# Patient Record
Sex: Female | Born: 1970
Health system: Southern US, Community
[De-identification: ages and names within clinical notes are randomized; demographics above are authoritative.]

## PROBLEM LIST (undated history)

## (undated) DIAGNOSIS — T8859XA Other complications of anesthesia, initial encounter: Secondary | ICD-10-CM

## (undated) DIAGNOSIS — R5382 Chronic fatigue, unspecified: Secondary | ICD-10-CM

## (undated) DIAGNOSIS — N8003 Adenomyosis of the uterus: Secondary | ICD-10-CM

## (undated) DIAGNOSIS — R55 Syncope and collapse: Secondary | ICD-10-CM

## (undated) DIAGNOSIS — I499 Cardiac arrhythmia, unspecified: Secondary | ICD-10-CM

## (undated) DIAGNOSIS — R3129 Other microscopic hematuria: Secondary | ICD-10-CM

## (undated) DIAGNOSIS — R011 Cardiac murmur, unspecified: Secondary | ICD-10-CM

## (undated) DIAGNOSIS — F32A Depression, unspecified: Secondary | ICD-10-CM

## (undated) DIAGNOSIS — J3489 Other specified disorders of nose and nasal sinuses: Secondary | ICD-10-CM

## (undated) DIAGNOSIS — F419 Anxiety disorder, unspecified: Secondary | ICD-10-CM

## (undated) DIAGNOSIS — Z8719 Personal history of other diseases of the digestive system: Secondary | ICD-10-CM

## (undated) DIAGNOSIS — I5189 Other ill-defined heart diseases: Secondary | ICD-10-CM

## (undated) DIAGNOSIS — E559 Vitamin D deficiency, unspecified: Secondary | ICD-10-CM

## (undated) DIAGNOSIS — Z87442 Personal history of urinary calculi: Secondary | ICD-10-CM

## (undated) DIAGNOSIS — R6889 Other general symptoms and signs: Secondary | ICD-10-CM

## (undated) DIAGNOSIS — Z8679 Personal history of other diseases of the circulatory system: Secondary | ICD-10-CM

## (undated) DIAGNOSIS — K219 Gastro-esophageal reflux disease without esophagitis: Secondary | ICD-10-CM

## (undated) DIAGNOSIS — R519 Headache, unspecified: Secondary | ICD-10-CM

## (undated) DIAGNOSIS — IMO0001 Reserved for inherently not codable concepts without codable children: Secondary | ICD-10-CM

## (undated) DIAGNOSIS — R002 Palpitations: Secondary | ICD-10-CM

## (undated) DIAGNOSIS — N390 Urinary tract infection, site not specified: Secondary | ICD-10-CM

## (undated) DIAGNOSIS — G8929 Other chronic pain: Secondary | ICD-10-CM

## (undated) DIAGNOSIS — Z8701 Personal history of pneumonia (recurrent): Secondary | ICD-10-CM

## (undated) DIAGNOSIS — J45909 Unspecified asthma, uncomplicated: Secondary | ICD-10-CM

## (undated) DIAGNOSIS — N393 Stress incontinence (female) (male): Secondary | ICD-10-CM

## (undated) DIAGNOSIS — M797 Fibromyalgia: Secondary | ICD-10-CM

## (undated) DIAGNOSIS — D500 Iron deficiency anemia secondary to blood loss (chronic): Secondary | ICD-10-CM

## (undated) DIAGNOSIS — N800 Endometriosis of uterus: Secondary | ICD-10-CM

## (undated) DIAGNOSIS — T7840XA Allergy, unspecified, initial encounter: Secondary | ICD-10-CM

## (undated) DIAGNOSIS — M1990 Unspecified osteoarthritis, unspecified site: Secondary | ICD-10-CM

## (undated) DIAGNOSIS — K200 Eosinophilic esophagitis: Secondary | ICD-10-CM

## (undated) DIAGNOSIS — N809 Endometriosis, unspecified: Secondary | ICD-10-CM

## (undated) DIAGNOSIS — M6289 Other specified disorders of muscle: Secondary | ICD-10-CM

## (undated) DIAGNOSIS — K1370 Unspecified lesions of oral mucosa: Secondary | ICD-10-CM

## (undated) DIAGNOSIS — T4145XA Adverse effect of unspecified anesthetic, initial encounter: Secondary | ICD-10-CM

## (undated) DIAGNOSIS — K589 Irritable bowel syndrome without diarrhea: Secondary | ICD-10-CM

## (undated) HISTORY — DX: Eosinophilic esophagitis: K20.0

## (undated) HISTORY — DX: Allergy, unspecified, initial encounter: T78.40XA

## (undated) HISTORY — DX: Unspecified asthma, uncomplicated: J45.909

## (undated) HISTORY — DX: Iron deficiency anemia secondary to blood loss (chronic): D50.0

## (undated) HISTORY — DX: Palpitations: R00.2

## (undated) HISTORY — DX: Other ill-defined heart diseases: I51.89

## (undated) HISTORY — DX: Anxiety disorder, unspecified: F41.9

## (undated) HISTORY — PX: BREAST BIOPSY: SHX20

## (undated) HISTORY — PX: ABDOMINAL HYSTERECTOMY: SHX81

## (undated) HISTORY — DX: Other microscopic hematuria: R31.29

## (undated) HISTORY — DX: Chronic fatigue, unspecified: R53.82

## (undated) HISTORY — DX: Irritable bowel syndrome without diarrhea: K58.9

## (undated) HISTORY — DX: Stress incontinence (female) (male): N39.3

## (undated) HISTORY — DX: Other specified disorders of nose and nasal sinuses: J34.89

## (undated) HISTORY — DX: Other specified disorders of muscle: M62.89

## (undated) HISTORY — DX: Other chronic pain: G89.29

## (undated) HISTORY — DX: Urinary tract infection, site not specified: N39.0

---
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
## 1998-04-17 ENCOUNTER — Encounter: Admission: RE | Admit: 1998-04-17 | Discharge: 1998-04-17 | Payer: Self-pay | Admitting: Family Medicine

## 1998-06-20 ENCOUNTER — Ambulatory Visit (HOSPITAL_COMMUNITY): Admission: RE | Admit: 1998-06-20 | Discharge: 1998-06-20 | Payer: Self-pay | Admitting: Obstetrics & Gynecology

## 1998-06-26 ENCOUNTER — Encounter: Admission: RE | Admit: 1998-06-26 | Discharge: 1998-06-26 | Payer: Self-pay | Admitting: Family Medicine

## 1998-07-20 ENCOUNTER — Inpatient Hospital Stay (HOSPITAL_COMMUNITY): Admission: AD | Admit: 1998-07-20 | Discharge: 1998-07-20 | Payer: Self-pay | Admitting: Obstetrics & Gynecology

## 1998-08-21 ENCOUNTER — Inpatient Hospital Stay (HOSPITAL_COMMUNITY): Admission: AD | Admit: 1998-08-21 | Discharge: 1998-08-23 | Payer: Self-pay | Admitting: Obstetrics and Gynecology

## 1998-10-11 ENCOUNTER — Other Ambulatory Visit: Admission: RE | Admit: 1998-10-11 | Discharge: 1998-10-11 | Payer: Self-pay | Admitting: Obstetrics & Gynecology

## 1999-04-23 ENCOUNTER — Encounter: Admission: RE | Admit: 1999-04-23 | Discharge: 1999-04-23 | Payer: Self-pay | Admitting: Sports Medicine

## 1999-07-02 ENCOUNTER — Encounter: Admission: RE | Admit: 1999-07-02 | Discharge: 1999-07-02 | Payer: Self-pay | Admitting: Family Medicine

## 1999-12-11 ENCOUNTER — Other Ambulatory Visit: Admission: RE | Admit: 1999-12-11 | Discharge: 1999-12-11 | Payer: Self-pay | Admitting: Obstetrics and Gynecology

## 1999-12-31 ENCOUNTER — Encounter: Admission: RE | Admit: 1999-12-31 | Discharge: 1999-12-31 | Payer: Self-pay | Admitting: Family Medicine

## 2000-08-07 ENCOUNTER — Encounter: Admission: RE | Admit: 2000-08-07 | Discharge: 2000-08-07 | Payer: Self-pay | Admitting: Family Medicine

## 2000-08-13 ENCOUNTER — Encounter: Admission: RE | Admit: 2000-08-13 | Discharge: 2000-08-13 | Payer: Self-pay | Admitting: Obstetrics and Gynecology

## 2000-08-13 ENCOUNTER — Encounter: Payer: Self-pay | Admitting: Obstetrics and Gynecology

## 2000-11-13 ENCOUNTER — Encounter: Admission: RE | Admit: 2000-11-13 | Discharge: 2000-11-13 | Payer: Self-pay | Admitting: Family Medicine

## 2001-01-06 ENCOUNTER — Other Ambulatory Visit: Admission: RE | Admit: 2001-01-06 | Discharge: 2001-01-06 | Payer: Self-pay | Admitting: Obstetrics and Gynecology

## 2001-01-13 ENCOUNTER — Encounter: Payer: Self-pay | Admitting: Obstetrics and Gynecology

## 2001-01-13 ENCOUNTER — Encounter: Admission: RE | Admit: 2001-01-13 | Discharge: 2001-01-13 | Payer: Self-pay | Admitting: Obstetrics and Gynecology

## 2001-07-28 ENCOUNTER — Inpatient Hospital Stay (HOSPITAL_COMMUNITY): Admission: AD | Admit: 2001-07-28 | Discharge: 2001-07-30 | Payer: Self-pay | Admitting: Obstetrics and Gynecology

## 2001-08-31 ENCOUNTER — Other Ambulatory Visit: Admission: RE | Admit: 2001-08-31 | Discharge: 2001-08-31 | Payer: Self-pay | Admitting: Obstetrics and Gynecology

## 2001-10-21 ENCOUNTER — Encounter: Admission: RE | Admit: 2001-10-21 | Discharge: 2001-10-21 | Payer: Self-pay | Admitting: Family Medicine

## 2002-01-12 ENCOUNTER — Encounter: Admission: RE | Admit: 2002-01-12 | Discharge: 2002-01-12 | Payer: Self-pay | Admitting: Family Medicine

## 2002-01-24 ENCOUNTER — Encounter: Admission: RE | Admit: 2002-01-24 | Discharge: 2002-01-24 | Payer: Self-pay | Admitting: Family Medicine

## 2002-03-09 ENCOUNTER — Other Ambulatory Visit: Admission: RE | Admit: 2002-03-09 | Discharge: 2002-03-09 | Payer: Self-pay | Admitting: Obstetrics and Gynecology

## 2002-06-20 ENCOUNTER — Inpatient Hospital Stay (HOSPITAL_COMMUNITY): Admission: AD | Admit: 2002-06-20 | Discharge: 2002-06-20 | Payer: Self-pay | Admitting: Obstetrics and Gynecology

## 2002-08-24 ENCOUNTER — Inpatient Hospital Stay (HOSPITAL_COMMUNITY): Admission: AD | Admit: 2002-08-24 | Discharge: 2002-08-24 | Payer: Self-pay | Admitting: Pediatrics

## 2002-09-26 ENCOUNTER — Inpatient Hospital Stay (HOSPITAL_COMMUNITY): Admission: AD | Admit: 2002-09-26 | Discharge: 2002-09-28 | Payer: Self-pay | Admitting: Obstetrics and Gynecology

## 2002-10-24 ENCOUNTER — Other Ambulatory Visit: Admission: RE | Admit: 2002-10-24 | Discharge: 2002-10-24 | Payer: Self-pay | Admitting: Obstetrics and Gynecology

## 2003-01-05 ENCOUNTER — Encounter: Admission: RE | Admit: 2003-01-05 | Discharge: 2003-01-05 | Payer: Self-pay | Admitting: Family Medicine

## 2003-03-01 ENCOUNTER — Encounter: Admission: RE | Admit: 2003-03-01 | Discharge: 2003-03-01 | Payer: Self-pay | Admitting: Family Medicine

## 2003-03-28 ENCOUNTER — Encounter: Admission: RE | Admit: 2003-03-28 | Discharge: 2003-03-28 | Payer: Self-pay | Admitting: Family Medicine

## 2003-04-18 ENCOUNTER — Encounter: Admission: RE | Admit: 2003-04-18 | Discharge: 2003-04-18 | Payer: Self-pay | Admitting: Family Medicine

## 2003-12-06 ENCOUNTER — Other Ambulatory Visit: Admission: RE | Admit: 2003-12-06 | Discharge: 2003-12-06 | Payer: Self-pay | Admitting: Obstetrics and Gynecology

## 2004-07-18 ENCOUNTER — Ambulatory Visit: Payer: Self-pay | Admitting: Family Medicine

## 2005-01-02 ENCOUNTER — Other Ambulatory Visit: Admission: RE | Admit: 2005-01-02 | Discharge: 2005-01-02 | Payer: Self-pay | Admitting: Obstetrics and Gynecology

## 2005-02-03 ENCOUNTER — Ambulatory Visit: Payer: Self-pay | Admitting: Family Medicine

## 2005-02-11 ENCOUNTER — Encounter: Admission: RE | Admit: 2005-02-11 | Discharge: 2005-02-11 | Payer: Self-pay | Admitting: Obstetrics and Gynecology

## 2005-03-04 ENCOUNTER — Ambulatory Visit: Payer: Self-pay | Admitting: Family Medicine

## 2005-04-24 ENCOUNTER — Emergency Department (HOSPITAL_COMMUNITY): Admission: EM | Admit: 2005-04-24 | Discharge: 2005-04-24 | Payer: Self-pay | Admitting: Emergency Medicine

## 2005-08-28 ENCOUNTER — Ambulatory Visit: Payer: Self-pay | Admitting: Family Medicine

## 2005-09-02 ENCOUNTER — Ambulatory Visit: Payer: Self-pay | Admitting: Family Medicine

## 2005-12-11 ENCOUNTER — Encounter (INDEPENDENT_AMBULATORY_CARE_PROVIDER_SITE_OTHER): Payer: Self-pay | Admitting: *Deleted

## 2005-12-11 LAB — CONVERTED CEMR LAB

## 2006-07-23 ENCOUNTER — Ambulatory Visit: Payer: Self-pay | Admitting: Family Medicine

## 2006-09-03 ENCOUNTER — Ambulatory Visit: Payer: Self-pay | Admitting: Family Medicine

## 2006-12-15 ENCOUNTER — Ambulatory Visit (HOSPITAL_COMMUNITY): Admission: RE | Admit: 2006-12-15 | Discharge: 2006-12-15 | Payer: Self-pay | Admitting: Obstetrics and Gynecology

## 2006-12-17 ENCOUNTER — Ambulatory Visit: Payer: Self-pay | Admitting: Family Medicine

## 2007-01-07 DIAGNOSIS — M797 Fibromyalgia: Secondary | ICD-10-CM | POA: Insufficient documentation

## 2007-01-08 ENCOUNTER — Encounter (INDEPENDENT_AMBULATORY_CARE_PROVIDER_SITE_OTHER): Payer: Self-pay | Admitting: *Deleted

## 2007-03-02 ENCOUNTER — Ambulatory Visit (HOSPITAL_COMMUNITY): Admission: RE | Admit: 2007-03-02 | Discharge: 2007-03-02 | Payer: Self-pay | Admitting: Obstetrics and Gynecology

## 2007-07-26 ENCOUNTER — Inpatient Hospital Stay (HOSPITAL_COMMUNITY): Admission: AD | Admit: 2007-07-26 | Discharge: 2007-07-26 | Payer: Self-pay | Admitting: Obstetrics and Gynecology

## 2007-08-02 ENCOUNTER — Inpatient Hospital Stay (HOSPITAL_COMMUNITY): Admission: AD | Admit: 2007-08-02 | Discharge: 2007-08-02 | Payer: Self-pay | Admitting: Obstetrics and Gynecology

## 2007-08-06 ENCOUNTER — Inpatient Hospital Stay (HOSPITAL_COMMUNITY): Admission: AD | Admit: 2007-08-06 | Discharge: 2007-08-08 | Payer: Self-pay | Admitting: Obstetrics and Gynecology

## 2007-09-09 ENCOUNTER — Ambulatory Visit: Payer: Self-pay | Admitting: Family Medicine

## 2008-10-26 ENCOUNTER — Encounter: Admission: RE | Admit: 2008-10-26 | Discharge: 2008-10-26 | Payer: Self-pay | Admitting: Obstetrics and Gynecology

## 2008-11-13 ENCOUNTER — Encounter (INDEPENDENT_AMBULATORY_CARE_PROVIDER_SITE_OTHER): Payer: Self-pay | Admitting: Diagnostic Radiology

## 2008-11-13 ENCOUNTER — Encounter: Admission: RE | Admit: 2008-11-13 | Discharge: 2008-11-13 | Payer: Self-pay | Admitting: Obstetrics and Gynecology

## 2009-07-19 ENCOUNTER — Ambulatory Visit: Payer: Self-pay | Admitting: Family Medicine

## 2009-07-19 DIAGNOSIS — J309 Allergic rhinitis, unspecified: Secondary | ICD-10-CM | POA: Insufficient documentation

## 2009-08-08 ENCOUNTER — Encounter: Admission: RE | Admit: 2009-08-08 | Discharge: 2009-08-08 | Payer: Self-pay | Admitting: Obstetrics and Gynecology

## 2009-12-17 ENCOUNTER — Encounter: Admission: RE | Admit: 2009-12-17 | Discharge: 2009-12-17 | Payer: Self-pay | Admitting: Obstetrics and Gynecology

## 2010-05-21 ENCOUNTER — Encounter: Payer: Self-pay | Admitting: Family Medicine

## 2010-05-27 ENCOUNTER — Ambulatory Visit: Payer: Self-pay | Admitting: Family Medicine

## 2010-08-08 ENCOUNTER — Telehealth: Payer: Self-pay | Admitting: *Deleted

## 2010-12-04 ENCOUNTER — Ambulatory Visit
Admission: RE | Admit: 2010-12-04 | Discharge: 2010-12-04 | Payer: Self-pay | Source: Home / Self Care | Attending: Family Medicine | Admitting: Family Medicine

## 2010-12-04 DIAGNOSIS — J029 Acute pharyngitis, unspecified: Secondary | ICD-10-CM | POA: Insufficient documentation

## 2010-12-10 ENCOUNTER — Telehealth: Payer: Self-pay | Admitting: Family Medicine

## 2010-12-10 NOTE — Consult Note (Signed)
Summary: [facility]   Imported By: Clydell Hakim 06/07/2010 14:04:45  _____________________________________________________________________  External Attachment:    Type:   Image     Comment:   External Document

## 2010-12-10 NOTE — Progress Notes (Signed)
Summary: Triage call  Phone Note Call from Patient Call back at Home Phone [phone]   Caller: Patient Summary of Call: having low bp (90/50) and very heavy menses she is swollen and hurts all over (has fibromyalgia) and wants to know if she could take Tylenol or Advil for her pain - not sure what to do  Initial call taken by: De Nurse,  August 08, 2010 1:47 PM  Follow-up for Phone Call        Menstral cycle is almost complete and has been communicating with her OB/Gyn regarding the heavy flow (Dr. Huntley Dec).  Encouraged pt to drink plenty of fluids and use salt with food.  Told her it was okay to take either Tylenol or Advil.  Advised her to stop by the clinic tomorrow and we would check a manual BP on her.  Pt agreeable.   Follow-up by: Dennison Nancy RN,  August 08, 2010 3:52 PM

## 2010-12-10 NOTE — Assessment & Plan Note (Signed)
Summary: low BP,df   Vital Signs:  Patient profile:   40 year old female Height:      66.5 inches Weight:      142 pounds BMI:     22.66 BSA:     1.74 Temp:     98.2 degrees F Pulse rate:   62 / minute Pulse (ortho):   87 / minute BP sitting:   123 / 77 BP standing:   105 / 72  Vitals Entered By: Jone Baseman CMA (May 27, 2010 2:33 PM)  Serial Vital Signs/Assessments:  Time      Position  BP       Pulse  Resp  Temp     By 3:02 PM   Lying LA  107/72   64                    Asha Benton LPN 1:61 PM   Standing  105/72   87                    Asha Benton LPN  CC: Low BP Is Patient Diabetic? No Pain Assessment Patient in pain? no        CC:  Low BP.  History of Present Illness: Episode 2.5 weeks ago one morning was rushing and working after not having eaten anybreakfast when felt nauseas and the had urge defecate the intense heat flash then panting and numbness of all extremities and inablity to move mouth muscles. Lasted apprx 10 minutes and resolved.  None since.  had eaten squash night before to which she has had GI reactions in the past.  No chest pain or shortness of breath during or since.  No blood loss.  Had blood tests in Dr Dulce Sellar office last week no results yet.  She checked her blood pressure last week at her Gyn office and was low 90/58.  She feels orthostatic when stands abruptly.   No new medications or change in diet  ROS - as above PMH - Medications reviewed and updated in medication list.  Smoking Status noted in VS form    Habits & Providers  Alcohol-Tobacco-Diet     Tobacco Status: never  Allergies: 1)  Sulfamethoxazole (Sulfamethoxazole)  Physical Exam  General:  Well-developed,well-nourished,in no acute distress; alert,appropriate and cooperative throughout examination Eyes:  PERRL EOMI  Neck:  No deformities, masses, or tenderness noted. Lungs:  Normal respiratory effort, chest expands symmetrically. Lungs are clear to auscultation, no  crackles or wheezes. Heart:  Normal rate and regular rhythm. S1 and S2 normal without gallop, murmur, click, rub or other extra sounds. Abdomen:  Bowel sounds positive,abdomen soft and non-tender without masses, organomegaly or hernias noted. Msk:  No deformity or scoliosis noted of thoracic or lumbar spine.   Extremities:  No clubbing, cyanosis, edema, or deformity noted with normal full range of motion of all joints.   Neurologic:  CN 2-7 intact.  Strength and gait normal    Impression & Recommendations:  Problem # 1:  HYPOTENSION (ICD-458.9) Assessment New  I think her episode is consistent with low blood pressure due to chronic dehydration coupled with transient intestinal cramps which caused hyperventilation and resultant extremity numbness.  No evidence of cardiovascular or cns disease or event.  Recommend better hydration and montioring of her blood pressure.  Will ask for labs from Dr Dulce Sellar   Orders: [facility]- Est  Level 4 (09604)  Problem # 2:  SYNCOPE (ICD-780.2) no further occurences   Complete  Medication List: 1)  Nasonex 50 Mcg/act Susp (Mometasone furoate) .Marland Kitchen.. 1-2 each nostril qd 2)  Loratadine 10 Mg Tabs (Loratadine) .... Once daily as needed allergies otc  Patient Instructions: 1)  Come back if any other symptoms 2)  Drink water or gatorade regularly and moderately increase your intake of salt. 3)  We will send for records of Dr Malena Edman blood tests and I will call you if we need additional ones 4)  Hope the summer goes well

## 2010-12-12 ENCOUNTER — Encounter: Payer: Self-pay | Admitting: *Deleted

## 2010-12-12 NOTE — Assessment & Plan Note (Signed)
Summary: PHYSICAL/REVIEW BLOOD WORK/[facility]   Vital Signs:  Patient profile:   40 year old female Height:      66.5 inches Weight:      143.4 pounds BMI:     22.88 Temp:     98.3 degrees F oral Pulse rate:   70 / minute BP sitting:   111 / 75  (left arm) Cuff size:   regular  Vitals Entered By: Garen Grams LPN (December 04, 2010 10:08 AM) CC: cpe Is Patient Diabetic? No Pain Assessment Patient in pain? yes     Location: joints   CC:  cpe.  History of Present Illness: Sorethroat sore for last 1-2 weeks.  Treated with partial course of Amox from UC (she stopped becuase too high a dose 1000 mg two times a day).   Feels mildly congested and has swollen glands.  No fever recenlty.  No rash.  Fibromyalgia flare Achy Joints  worse last few weeks. Lots of stress with her mom diagnosised with cancer.  No redness or soft tissue swelling is worse after activity but has not been exercising with throat and with too many activities  ROS - as above PMH - Medications reviewed and updated in medication list.  Smoking Status noted in VS form    Habits & Providers  Alcohol-Tobacco-Diet     Tobacco Status: never  Current Medications (verified): 1)  Nasonex 50 Mcg/act Susp (Mometasone Furoate) .Marland Kitchen.. 1-2 Each Nostril Qd 2)  Loratadine 10 Mg  Tabs (Loratadine) .... Once Daily As Needed Allergies Otc  Allergies: 1)  Sulfamethoxazole (Sulfamethoxazole)  Physical Exam  General:  Well-developed,well-nourished,in no acute distress; alert,appropriate and cooperative throughout examination Head:  Normocephalic and atraumatic without obvious abnormalities. No apparent alopecia or balding. Nose:  External nasal examination shows no deformity or inflammation. Nasal mucosa are slightlty boggy without pus Mouth:  throat is red without pus and showing some post nasal drip,   Neck:  mildly increased adenopathy bilaterally Lungs:  Normal respiratory effort, chest expands symmetrically. Lungs are clear  to auscultation, no crackles or wheezes. Heart:  Normal rate and regular rhythm. S1 and S2 normal without gallop, murmur, click, rub or other extra sounds. Abdomen:  Bowel sounds positive,abdomen soft and non-tender without masses, organomegaly or hernias noted. Msk:  No deformity or scoliosis noted of thoracic or lumbar spine.   Extremities:  No clubbing, cyanosis, edema, or deformity noted with normal full range of motion of all joints.  Muscles on palpation are diffusely mildly tender  Skin:  Intact without suspicious lesions or rashes Inguinal Nodes:  No significant adenopathy   Impression & Recommendations:  Problem # 1:  PHARYNGITIS (ICD-462) Assessment New  Most consistent with a partially treated strep throat vs a prolonged viral URI wiht pain due to post nasal drip.  will ask her to finish a 10 day course of regular dose amoxicillin and use clariten and follow to resolution  Orders: [facility]- Est  Level 4 (16109)  Problem # 2:  FIBROMYALGIA, FIBROMYOSITIS (ICD-729.1) Assessment: Deteriorated  Her muscle pain seems to be a flare of FM.  No signs of arthritis or strep related disease.   Suggest slow resumption of exercise and stress management  Orders: [facility]- Est  Level 4 (60454)  Complete Medication List: 1)  Nasonex 50 Mcg/act Susp (Mometasone furoate) .Marland Kitchen.. 1-2 each nostril qd 2)  Loratadine 10 Mg Tabs (Loratadine) .... Once daily as needed allergies otc  Patient Instructions: 1)  Finsih 10 days of Amoxicillin 250 mg 1/2 capsule  two times a day  2)  Start Clariten once a day 3)  Exercise starting 5 min day then go up by 2-3 min a day 4)  Call if you are not better in 2 weeks   Orders Added: 1)  [facility]- Est  Level 4 [47829]

## 2010-12-18 NOTE — Progress Notes (Signed)
Summary: blood in urine  Phone Note Call from Patient   Summary of Call:  Has noticed blood in urine since today that occured after sex.  Has hx of this in past.  no fever, no back pain, is concerned that she may have a uti.  Instructed to call in AM for work in appt or go to ER if she feels that she needs to be evaluated tonight. Pt states she will call in am for appt. Ellin Mayhew MD  December 10, 2010 9:25 PM

## 2011-01-06 ENCOUNTER — Other Ambulatory Visit: Payer: Self-pay | Admitting: Family Medicine

## 2011-04-30 ENCOUNTER — Ambulatory Visit (INDEPENDENT_AMBULATORY_CARE_PROVIDER_SITE_OTHER): Payer: BC Managed Care – PPO | Admitting: *Deleted

## 2011-04-30 VITALS — Temp 98.3°F

## 2011-04-30 DIAGNOSIS — Z23 Encounter for immunization: Secondary | ICD-10-CM

## 2011-04-30 MED ORDER — TETANUS-DIPHTH-ACELL PERTUSSIS 5-2.5-18.5 LF-MCG/0.5 IM SUSP: 0.5000 mL | Freq: Once | INTRAMUSCULAR | Status: DC

## 2011-07-29 ENCOUNTER — Encounter: Payer: Self-pay | Admitting: Family Medicine

## 2011-08-06 ENCOUNTER — Encounter: Payer: Self-pay | Admitting: Family Medicine

## 2011-08-06 ENCOUNTER — Ambulatory Visit (INDEPENDENT_AMBULATORY_CARE_PROVIDER_SITE_OTHER): Payer: BC Managed Care – PPO | Admitting: Family Medicine

## 2011-08-06 VITALS — BP 111/72 | HR 73 | Temp 98.0°F | Wt 140.0 lb

## 2011-08-06 DIAGNOSIS — G4700 Insomnia, unspecified: Secondary | ICD-10-CM | POA: Insufficient documentation

## 2011-08-06 DIAGNOSIS — R5381 Other malaise: Secondary | ICD-10-CM

## 2011-08-06 DIAGNOSIS — R5383 Other fatigue: Secondary | ICD-10-CM | POA: Insufficient documentation

## 2011-08-06 LAB — COMPREHENSIVE METABOLIC PANEL
ALT: 8 U/L (ref 0–35)
Alkaline Phosphatase: 58 U/L (ref 39–117)
CO2: 24 mEq/L (ref 19–32)
Glucose, Bld: 90 mg/dL (ref 70–99)

## 2011-08-06 LAB — CBC
Hemoglobin: 12.4 g/dL (ref 12.0–15.0)
MCH: 28.3 pg (ref 26.0–34.0)
MCHC: 32.2 g/dL (ref 30.0–36.0)
RBC: 4.38 MIL/uL (ref 3.87–5.11)
RDW: 13.6 % (ref 11.5–15.5)

## 2011-08-06 MED ORDER — ZOLPIDEM TARTRATE 5 MG PO TABS: 5.0000 mg | ORAL_TABLET | Freq: Every evening | ORAL | Status: DC | PRN

## 2011-08-06 NOTE — Patient Instructions (Signed)
You should feel stressed  Keep up the exercise that is the best medicine  Use ambien 1/2-1 tablet as needed but not more than 3 x a week  Make an appointment if things are worsening

## 2011-08-06 NOTE — Assessment & Plan Note (Addendum)
Persistent.  Seems most likely due to stress of her many responsibilities with 4 children and traveling husband and mother is dying of cancer.  Does not meet criteria for depression.  Will check labs and reassure if these are normal

## 2011-08-06 NOTE — Assessment & Plan Note (Signed)
New, situational.  She does not feel depressed.  Will treat with intermittent hypnotic and follow

## 2011-08-06 NOTE — Progress Notes (Signed)
  Subjective:    Patient ID: Emily Joseph, female    DOB: June 05, 1971, 40 y.o.   MRN: [id number]  HPI Here for check up Concerns  FATIGUE  Onset: for last few months at least  Fatigue with exertion: no  Physical limitations: no Primarily motivational fatigue: mostly related to stress feels tired and trouble sleeping at the end of the day.  Primarily physical fatigue: no  Symptoms Fever: no  Night sweats: no  Weight loss: no   Exertional chest pain: no  Dyspnea: no  Cough: no  Hemoptysis: no  New medications: no  Leg swelling: no  Orthopnea: no  PND: no  Melena: no  Adenopathy: mild seasonal with allergies  Severe snoring: no  Daytime sleepiness: no  Skin changes: no  Feeling depressed: no  Anhedonia: no  Altered appetite: no  Poor sleep: yes, does not feel rested, trouble getting to sleep cause worrying.    ABDOMINAL PAIN  Location: RLQ Onset: on and off for many months  Radiation: no Severity: causes her to notice it but not stop what she is doing Quality: stabbing Duration: minutes Better with: just goes away Worse with: nothing   Symptoms Nausea/Vomiting: no  Diarrhea: no  Constipation: yes, just her usual  Melena/BRBPR: no  Hematemesis: no  Anorexia: no  Fever/Chills: no  Dysuria: no  Rash: no  Wt loss: no  EtOH use: no  NSAIDs/ASA: yes, rarely  LMP: irregular   Past Surgeries: had surgery in that area as a teenager  Review of Systems see above    Objective:   Physical Exam Heart - Regular rate and rhythm.  No murmurs, gallops or rubs.    Lungs:  Normal respiratory effort, chest expands symmetrically. Lungs are clear to auscultation, no crackles or wheezes. Abdomen: soft and tender mildly in RLQ without masses, organomegaly or hernias noted.  No guarding or rebound Extremities:  No cyanosis, edema, or deformity noted with good range of motion of all major joints.   Neck:  No deformities, thyromegaly, masses, or tenderness noted.   Supple with full  range of motion without pain. Ears:  External ear exam shows no significant lesions or deformities.  Otoscopic examination reveals clear canals, tympanic membranes are intact bilaterally without bulging, retraction, inflammation or discharge. Hearing is grossly normal bilaterall Skin:  Intact without suspicious lesions or rashes         Assessment & Plan:

## 2011-08-07 ENCOUNTER — Encounter: Payer: Self-pay | Admitting: Family Medicine

## 2011-08-21 LAB — CBC
MCHC: 35.2
MCV: 91.8
MCV: 92.3
Platelets: 164
Platelets: 187
RBC: 3.61 — ABNORMAL LOW

## 2011-08-21 LAB — RAPID HIV SCREEN (WH-MAU): Rapid HIV Screen: NONREACTIVE

## 2012-01-26 ENCOUNTER — Other Ambulatory Visit: Payer: Self-pay | Admitting: Obstetrics and Gynecology

## 2012-02-19 ENCOUNTER — Encounter: Payer: Self-pay | Admitting: Rheumatology

## 2012-03-24 ENCOUNTER — Encounter: Payer: Self-pay | Admitting: Family Medicine

## 2012-03-24 ENCOUNTER — Ambulatory Visit (INDEPENDENT_AMBULATORY_CARE_PROVIDER_SITE_OTHER): Payer: 59 | Admitting: Family Medicine

## 2012-03-24 VITALS — BP 114/63 | HR 73 | Temp 98.2°F | Ht 66.5 in | Wt 135.0 lb

## 2012-03-24 DIAGNOSIS — J309 Allergic rhinitis, unspecified: Secondary | ICD-10-CM

## 2012-03-24 MED ORDER — AZITHROMYCIN 250 MG PO TABS: ORAL_TABLET | ORAL | Status: AC

## 2012-03-24 MED ORDER — MOMETASONE FUROATE 50 MCG/ACT NA SUSP: 2.0000 | Freq: Every day | NASAL | Status: DC

## 2012-03-24 NOTE — Patient Instructions (Signed)
Use the nasonex twice daily for 3 days then once a day  Start the antibiotic if ou are not better in 2 days

## 2012-03-24 NOTE — Progress Notes (Signed)
  Subjective:    Patient ID: Damita Dunnings, female    DOB: Aug 22, 1971, 41 y.o.   MRN: [id number]  HPI  Congestion Face Pain Worsening over the last few weeks.  Has tried nasal irrigation and oral antihistamine which helped a little.  Feels achy all over but no fever.  Thick yellow discharge No shortness of breath or chest pain or edema  Review of Symptoms - see HPI  PMH - Smoking status noted.     Review of Systems     Objective:   Physical Exam Heart - Regular rate and rhythm.  No murmurs, gallops or rubs.    Lungs:  Normal respiratory effort, chest expands symmetrically. Lungs are clear to auscultation, no crackles or wheezes. Nose:  External nasal examination shows no deformity or inflammation. Nasal mucosa are boggy and swollen. No septal dislocation or dislocation.No obstruction to airflow. Mild facial sinus tenderenss       Assessment & Plan:

## 2012-03-24 NOTE — Assessment & Plan Note (Signed)
Worsened recurrent.   Use nasal steroid and if not helping use antibiotics.

## 2012-03-26 ENCOUNTER — Other Ambulatory Visit: Payer: Self-pay | Admitting: Family Medicine

## 2012-03-26 MED ORDER — ZOLPIDEM TARTRATE 5 MG PO TABS: 5.0000 mg | ORAL_TABLET | Freq: Every evening | ORAL | Status: DC | PRN

## 2012-05-18 ENCOUNTER — Ambulatory Visit: Payer: 59 | Admitting: Family Medicine

## 2012-05-21 ENCOUNTER — Encounter: Payer: Self-pay | Admitting: Family Medicine

## 2012-05-21 ENCOUNTER — Ambulatory Visit (INDEPENDENT_AMBULATORY_CARE_PROVIDER_SITE_OTHER): Payer: 59 | Admitting: Family Medicine

## 2012-05-21 VITALS — BP 102/64 | HR 123 | Temp 98.3°F | Ht 66.5 in | Wt 136.0 lb

## 2012-05-21 DIAGNOSIS — J069 Acute upper respiratory infection, unspecified: Secondary | ICD-10-CM

## 2012-05-21 MED ORDER — ZOLPIDEM TARTRATE 5 MG PO TABS: 5.0000 mg | ORAL_TABLET | Freq: Every evening | ORAL | Status: DC | PRN

## 2012-05-21 NOTE — Patient Instructions (Addendum)
Mildreth,   Thank you for coming in today. It appears that your symptoms are from congestion: -continue pseudofed -rest  -if you develop fever you may need antibiotic but you do not need it now.   Make a f/u appt with Dr. Deirdre Priest.

## 2012-05-21 NOTE — Progress Notes (Signed)
Subjective:     Patient ID: Emily Joseph, female   DOB: [birth year], 41 y.o.   MRN: [id number]  HPI 41 yo F with hx of hypotension and fibromyalgia presents for same day visit with multiple concerns. After agenda setting the following was discussed/acknowleged and deferred to PCP:  1. Vertigo: x 24 hours started on Monday PM (4 nights ago) and last until the next evening. No change with position. Next day with headache and malaise. Evaluated in urgent care. Labs CMP and CBC normal. Urgent care provider prescribed amoxicillin which she did not take for URI/sinusitis. She has taken pseudophed with improvement in symptoms. She denies fever and chills. She does have a dry cough.   2. Transient tingling in L foot and feeling of heat on foot (she thought her sister's cat urinated on her foot): deferred to PCP.  3. Two periods last month. ? Check for early menopause: deferred to PCP.   Review of Systems As per HPI  Objective:   Physical Exam BP 102/64  Pulse 123  Temp 98.3 F (36.8 C) (Oral)  Ht 5' 6.5" (1.689 m)  Wt 136 lb (61.689 kg)  BMI 21.62 kg/m2 General appearance: alert, cooperative and no distress Head: Normocephalic, without obvious abnormality, atraumatic Eyes: conjunctivae/corneas clear. PERRL, EOM's intact. Fundi benign. Ears: normal TM and external ear canal left ear and abnormal TM right ear - bulging no erythema or air fluid level.  Nose: Nares normal. Septum midline. Mucosa normal. No drainage or sinus tenderness. Throat: lips, mucosa, and tongue normal; teeth and gums normal Neck: no adenopathy, no carotid bruit, no JVD, supple, symmetrical, trachea midline and thyroid not enlarged, symmetric, no tenderness/mass/nodules Lungs: clear to auscultation bilaterally Heart: regular rate and rhythm, S1, S2 normal, no murmur, click, rub or gallop Extremities: extremities normal, atraumatic, no cyanosis or edema Pulses: 2+ and symmetric Skin: Skin color, texture, turgor normal. No  rashes or lesions Neurologic: Grossly normal. Normal gait. No vertigo elicited with Dix-Hallpike maneuver.   Assessment and Plan:

## 2012-05-21 NOTE — Assessment & Plan Note (Addendum)
A: URI leading to vertigo and malaise. Improving with sudafed. No evidence of bacterial infection or inner ear otoliths causing vertigo.  P: Advised patient to continue sudafed for next few days. Restart nasonex. F/u as needed if symptoms worsen or she develops fever.  F/u with PCP for other concerns.  Re-prescribed Ambien which she has never taken to help with sleep. She was instructed to take this after she finished the course of sudafed.

## 2012-05-24 ENCOUNTER — Ambulatory Visit: Payer: 59 | Admitting: Family Medicine

## 2012-07-28 ENCOUNTER — Encounter: Payer: Self-pay | Admitting: Family Medicine

## 2012-07-28 ENCOUNTER — Ambulatory Visit (INDEPENDENT_AMBULATORY_CARE_PROVIDER_SITE_OTHER): Payer: 59 | Admitting: Family Medicine

## 2012-07-28 VITALS — BP 114/71 | HR 80 | Temp 98.2°F | Ht 66.5 in | Wt 139.0 lb

## 2012-07-28 DIAGNOSIS — N390 Urinary tract infection, site not specified: Secondary | ICD-10-CM

## 2012-07-28 DIAGNOSIS — R300 Dysuria: Secondary | ICD-10-CM

## 2012-07-28 LAB — POCT URINALYSIS DIPSTICK
Leukocytes, UA: NEGATIVE
Nitrite, UA: NEGATIVE
Protein, UA: NEGATIVE
Urobilinogen, UA: 0.2
pH, UA: 6.5

## 2012-07-28 LAB — POCT UA - MICROSCOPIC ONLY

## 2012-07-28 MED ORDER — CIPROFLOXACIN HCL 250 MG PO TABS: 250.0000 mg | ORAL_TABLET | Freq: Two times a day (BID) | ORAL | Status: DC

## 2012-07-28 NOTE — Patient Instructions (Addendum)
If your symptoms are not gone when you finish your antibiotics come back for a urine culture  Call if you have fever or worsening symptoms

## 2012-07-28 NOTE — Progress Notes (Signed)
  Subjective:    Patient ID: Emily Joseph, female    DOB: Jan 23, 1971, 41 y.o.   MRN: [id number]  HPI  Dysuria Four days ago started with dysuria , frequency and bloody urine.  Has had before.  No pain or fever.  [facility] 3 days without a ua or culture.   Is some better but not back to normal.  Blood is urine is finished.  Started menstrual period today     Review of Systems     Objective:   Physical Exam  Alert no acute distress No cvat Abdomen: soft and non-tender without masses, organomegaly or hernias noted.  No guarding or rebound       Assessment & Plan:

## 2012-07-28 NOTE — Assessment & Plan Note (Signed)
Partially treated.  Will increase duration of cipro to 7 days.  Will reculture if still symptomatic once this is done or if does not continue to improve or worsens

## 2012-10-01 ENCOUNTER — Other Ambulatory Visit: Payer: Self-pay | Admitting: Family Medicine

## 2012-10-01 MED ORDER — ZOLPIDEM TARTRATE 5 MG PO TABS: 5.0000 mg | ORAL_TABLET | Freq: Every evening | ORAL | Status: DC | PRN

## 2012-10-21 ENCOUNTER — Other Ambulatory Visit (HOSPITAL_COMMUNITY): Payer: Self-pay | Admitting: Obstetrics and Gynecology

## 2012-10-21 DIAGNOSIS — R109 Unspecified abdominal pain: Secondary | ICD-10-CM

## 2012-10-25 ENCOUNTER — Ambulatory Visit (HOSPITAL_COMMUNITY)
Admission: RE | Admit: 2012-10-25 | Discharge: 2012-10-25 | Disposition: A | Discharge status: Home / Self Care | Payer: 59 | Source: Ambulatory Visit | Attending: Obstetrics and Gynecology | Admitting: Obstetrics and Gynecology

## 2012-10-25 DIAGNOSIS — K7689 Other specified diseases of liver: Secondary | ICD-10-CM | POA: Insufficient documentation

## 2012-10-25 DIAGNOSIS — R109 Unspecified abdominal pain: Secondary | ICD-10-CM

## 2013-01-26 ENCOUNTER — Encounter: Payer: Self-pay | Admitting: Family Medicine

## 2013-01-26 ENCOUNTER — Ambulatory Visit (INDEPENDENT_AMBULATORY_CARE_PROVIDER_SITE_OTHER): Payer: 59 | Admitting: Family Medicine

## 2013-01-26 ENCOUNTER — Telehealth: Payer: Self-pay | Admitting: Family Medicine

## 2013-01-26 VITALS — BP 112/78 | HR 77 | Temp 98.1°F | Ht 66.5 in | Wt 141.5 lb

## 2013-01-26 DIAGNOSIS — K589 Irritable bowel syndrome without diarrhea: Secondary | ICD-10-CM

## 2013-01-26 DIAGNOSIS — D649 Anemia, unspecified: Secondary | ICD-10-CM

## 2013-01-26 DIAGNOSIS — R5383 Other fatigue: Secondary | ICD-10-CM

## 2013-01-26 DIAGNOSIS — D509 Iron deficiency anemia, unspecified: Secondary | ICD-10-CM | POA: Insufficient documentation

## 2013-01-26 DIAGNOSIS — R5381 Other malaise: Secondary | ICD-10-CM | POA: Insufficient documentation

## 2013-01-26 LAB — CBC
HCT: 35.2 % — ABNORMAL LOW (ref 36.0–46.0)
Hemoglobin: 11.5 g/dL — ABNORMAL LOW (ref 12.0–15.0)
WBC: 7.1 10*3/uL (ref 4.0–10.5)

## 2013-01-26 NOTE — Progress Notes (Signed)
  Subjective:    Patient ID: Emily Joseph, female    DOB: 01-27-1971, 42 y.o.   MRN: [id number]  HPI  Anemia Was diagnosed as being anemic (hgb 10?) by Dr Huntley Dec.  Felt to be due to heavy menstrual periods.  She has not been taking iron but eating green veggies.  Does feel tired no chest pain or recent syncope  Fatigue Associated with chills and sometimes muscle aches.  No fever or weight loss or rashes or focal infections.  Does have pain in a tooth for which she is seeing a dentist.  Saw rheumatologist Dr Rodena Goldmann last spring who did "lots" of blood work was apparently normal.  Also Dr Huntley Dec has checked her blood recently  Review of Symptoms - see HPI  PMH - Smoking status noted.      Review of Systems     Objective:   Physical Exam  Alert no acute distress Neck:  No deformities, thyromegaly, masses, or tenderness noted.   Supple with full range of motion without pain. Heart - Regular rate and rhythm.  No murmurs, gallops or rubs.    Lungs:  Normal respiratory effort, chest expands symmetrically. Lungs are clear to auscultation, no crackles or wheezes. Extremities:  No cyanosis, edema, or deformity noted with good range of motion of all major joints.         Assessment & Plan:

## 2013-01-26 NOTE — Assessment & Plan Note (Signed)
Vague complaints without any red flags on exam.  Has had lab work up from other physicians - will send for these results.  Will be checking cbc in meantime for signs of infection or anemia.  May be related to fibromyalgia diagnosis or possible IBS - she would like to be checked for celiac but also wants to see a GI physician is trying to decide who.

## 2013-01-26 NOTE — Telephone Encounter (Signed)
Mrs. Kuwahara called back to say she spoke with her dentist and the chills she's having is not r elated to her tooth

## 2013-01-26 NOTE — Assessment & Plan Note (Signed)
By history likely iron deficiency - will check labs

## 2013-01-26 NOTE — Patient Instructions (Addendum)
I will call you if your tests are not good.  Otherwise I will send you a letter.  If you do not hear from me with in 2 weeks please call our office.     Ask Dr Huntley Dec and [facility] send labs Sign a ROI  Let me know about a GI referral

## 2013-01-27 DIAGNOSIS — K589 Irritable bowel syndrome without diarrhea: Secondary | ICD-10-CM

## 2013-01-27 DIAGNOSIS — K581 Irritable bowel syndrome with constipation: Secondary | ICD-10-CM | POA: Insufficient documentation

## 2013-01-27 HISTORY — DX: Irritable bowel syndrome, unspecified: K58.9

## 2013-01-27 NOTE — Addendum Note (Signed)
Addended by: Pearlean Brownie L on: 01/27/2013 01:45 PM   Modules accepted: Orders

## 2013-02-01 ENCOUNTER — Telehealth: Payer: Self-pay | Admitting: Family Medicine

## 2013-02-01 NOTE — Telephone Encounter (Signed)
Spoke with her about taking iron at least 2-3 45 mg ferrous sulfate twice daily   She will be follow up with GI for her abdomen pain

## 2013-02-01 NOTE — Telephone Encounter (Signed)
Pt is asking to speak with Dr Deirdre Priest about doing more labs - liver sore/right side abd. pain.  Is wanting for Dr Deirdre Priest to call her.

## 2013-02-04 ENCOUNTER — Encounter (HOSPITAL_COMMUNITY): Payer: Self-pay | Admitting: *Deleted

## 2013-02-07 ENCOUNTER — Encounter (HOSPITAL_COMMUNITY): Payer: Self-pay | Admitting: Pharmacy Technician

## 2013-02-08 ENCOUNTER — Other Ambulatory Visit: Payer: Self-pay | Admitting: Gastroenterology

## 2013-02-08 NOTE — Addendum Note (Signed)
Addended by: Laquita Harlan on: 02/08/2013 05:23 PM   Modules accepted: Orders  

## 2013-02-09 ENCOUNTER — Ambulatory Visit (HOSPITAL_COMMUNITY)
Admission: RE | Admit: 2013-02-09 | Discharge: 2013-02-09 | Disposition: A | Discharge status: Home / Self Care | Payer: 59 | Source: Ambulatory Visit | Attending: Gastroenterology | Admitting: Gastroenterology

## 2013-02-09 ENCOUNTER — Encounter (HOSPITAL_COMMUNITY): Payer: Self-pay | Admitting: Anesthesiology

## 2013-02-09 ENCOUNTER — Encounter (HOSPITAL_COMMUNITY): Payer: Self-pay | Admitting: *Deleted

## 2013-02-09 ENCOUNTER — Ambulatory Visit (HOSPITAL_COMMUNITY): Payer: 59 | Admitting: Anesthesiology

## 2013-02-09 ENCOUNTER — Encounter (HOSPITAL_COMMUNITY)
Admission: RE | Disposition: A | Discharge status: Home / Self Care | Payer: Self-pay | Source: Ambulatory Visit | Attending: Gastroenterology

## 2013-02-09 DIAGNOSIS — R1310 Dysphagia, unspecified: Secondary | ICD-10-CM | POA: Insufficient documentation

## 2013-02-09 DIAGNOSIS — K644 Residual hemorrhoidal skin tags: Secondary | ICD-10-CM | POA: Insufficient documentation

## 2013-02-09 DIAGNOSIS — R109 Unspecified abdominal pain: Secondary | ICD-10-CM | POA: Insufficient documentation

## 2013-02-09 DIAGNOSIS — K648 Other hemorrhoids: Secondary | ICD-10-CM | POA: Insufficient documentation

## 2013-02-09 DIAGNOSIS — D649 Anemia, unspecified: Secondary | ICD-10-CM | POA: Insufficient documentation

## 2013-02-09 HISTORY — PX: COLONOSCOPY WITH PROPOFOL: SHX5780

## 2013-02-09 HISTORY — DX: Endometriosis, unspecified: N80.9

## 2013-02-09 HISTORY — DX: Other complications of anesthesia, initial encounter: T88.59XA

## 2013-02-09 HISTORY — PX: BALLOON DILATION: SHX5330

## 2013-02-09 HISTORY — DX: Gastro-esophageal reflux disease without esophagitis: K21.9

## 2013-02-09 HISTORY — DX: Fibromyalgia: M79.7

## 2013-02-09 HISTORY — DX: Syncope and collapse: R55

## 2013-02-09 HISTORY — DX: Adverse effect of unspecified anesthetic, initial encounter: T41.45XA

## 2013-02-09 HISTORY — DX: Adenomyosis of the uterus: N80.03

## 2013-02-09 HISTORY — DX: Endometriosis of uterus: N80.0

## 2013-02-09 HISTORY — PX: ESOPHAGOGASTRODUODENOSCOPY (EGD) WITH PROPOFOL: SHX5813

## 2013-02-09 SURGERY — ESOPHAGOGASTRODUODENOSCOPY (EGD) WITH PROPOFOL
Anesthesia: Monitor Anesthesia Care

## 2013-02-09 MED ORDER — FENTANYL CITRATE 0.05 MG/ML IJ SOLN
INTRAMUSCULAR | Status: DC | PRN
  Administered 2013-02-09 (×2): 50 ug via INTRAVENOUS

## 2013-02-09 MED ORDER — SODIUM CHLORIDE 0.9 % IV SOLN: INTRAVENOUS | Status: DC

## 2013-02-09 MED ORDER — BUTAMBEN-TETRACAINE-BENZOCAINE 2-2-14 % EX AERO
INHALATION_SPRAY | CUTANEOUS | Status: DC | PRN
  Administered 2013-02-09: 2 via TOPICAL

## 2013-02-09 MED ORDER — MIDAZOLAM HCL 5 MG/5ML IJ SOLN
INTRAMUSCULAR | Status: DC | PRN
  Administered 2013-02-09: 2 mg via INTRAVENOUS

## 2013-02-09 MED ORDER — PROPOFOL 10 MG/ML IV EMUL
INTRAVENOUS | Status: DC | PRN
  Administered 2013-02-09: 70 ug/kg/min via INTRAVENOUS

## 2013-02-09 MED ORDER — LACTATED RINGERS IV SOLN
INTRAVENOUS | Status: DC
  Administered 2013-02-09: 1000 mL via INTRAVENOUS
  Administered 2013-02-09: 12:00:00 via INTRAVENOUS

## 2013-02-09 SURGICAL SUPPLY — 24 items

## 2013-02-09 NOTE — Interval H&P Note (Signed)
History and Physical Interval Note:  02/09/2013 11:18 AM  Emily Joseph  has presented today for surgery, with the diagnosis of abd.pain/dysphagia/abd.pain /change in bowel  The various methods of treatment have been discussed with the patient and family. After consideration of risks, benefits and other options for treatment, the patient has consented to  Procedure(s) with comments: ESOPHAGOGASTRODUODENOSCOPY (EGD) WITH PROPOFOL (N/A) COLONOSCOPY WITH PROPOFOL (N/A) - need ultra thin colon scope BALLOON DILATION (N/A) as a surgical intervention .  The patient's history has been reviewed, patient examined, no change in status, stable for surgery.  I have reviewed the patient's chart and labs.  Questions were answered to the patient's satisfaction.     Shadow Stiggers M  Assessment:  1.  Dysphagia, ill-defined, possible motility disorder, doubt stricture (but can't be excluded). 2.  Generalized abdominal pain. 3.  Anemia.  History menorrhagia.  Plan:  1.  Endoscopy with possible esophageal dilatation. 2.  Colonoscopy. 3.  Risks (bleeding, infection, bowel perforation that could require surgery, sedation-related changes in cardiopulmonary systems), benefits (identification and possible treatment of source of symptoms, exclusion of certain causes of symptoms), and alternatives (watchful waiting, radiographic imaging studies, empiric medical treatment) of upper endoscopy with possible esophageal dilatation (EGD +/- DIL) were explained to patient in detail and patient wishes to proceed. 4.  Risks (bleeding, infection, bowel perforation that could require surgery, sedation-related changes in cardiopulmonary systems), benefits (identification and possible treatment of source of symptoms, exclusion of certain causes of symptoms), and alternatives (watchful waiting, radiographic imaging studies, empiric medical treatment) of colonoscopy were explained to patient in detail and patient wishes to  proceed.

## 2013-02-09 NOTE — Anesthesia Postprocedure Evaluation (Signed)
Anesthesia Post Note  Patient: Emily Joseph  Procedure(s) Performed: Procedure(s) (LRB): ESOPHAGOGASTRODUODENOSCOPY (EGD) WITH PROPOFOL (N/A) COLONOSCOPY WITH PROPOFOL (N/A) BALLOON DILATION (N/A)  Anesthesia type: MAC  Patient location: PACU  Post pain: Pain level controlled  Post assessment: Post-op Vital signs reviewed  Last Vitals:  Filed Vitals:   02/09/13 1255  BP: 127/45  Temp:   Resp: 13    Post vital signs: Reviewed  Level of consciousness: sedated  Complications: No apparent anesthesia complications

## 2013-02-09 NOTE — Op Note (Signed)
[facility] [address] [place], [address]   COLONOSCOPY PROCEDURE REPORT  PATIENT: Emily, Joseph  MR#: [id number] BIRTHDATE: 02-26-1971 , 41  yrs. old GENDER: Female ENDOSCOPIST: Willis Modena, MD REFERRED [id number] Pauline Good, M.D. PROCEDURE DATE:  02/09/2013 PROCEDURE:   Colonoscopy, diagnostic ASA CLASS:   Class II INDICATIONS:abdominal pain, anemia. MEDICATIONS: MAC sedation, administered by CRNA  DESCRIPTION OF PROCEDURE:   After the risks benefits and alternatives of the procedure were thoroughly explained, informed consent was obtained.  A digital rectal exam revealed external hemorrhoids.   The [id number] ([id number])  endoscope was introduced through the anus and advanced to the cecum, which was identified by both the appendix and ileocecal valve. No adverse events experienced.   The quality of the prep was good.  The instrument was then slowly withdrawn as the colon was fully examined.    Findings:  External hemorrhoids, otherwise normal digital rectal exam.  Prep quality good.  Keyhole views of the distal-most portion of the terminal ileum were normal.  No polyps, masses, vascular ectasias, or inflammatory changes were seen.   Retroflexed views of rectum showed internal hemorrhoids, otherwise normal. Withdrawal time was about 10 minutes .  The scope was withdrawn and the procedure completed.  ENDOSCOPIC IMPRESSION:     As above.  No GI tract source of anemia seen.  Suspect menorrhagia is playing largest role.  RECOMMENDATIONS:     1.  Watch for potential complications of procedure. 2.  No further GI tract imaging  felt necessary at this time. 3.  Follow-up with [facility] office in 6-8 weeks, pending biopsy results.  eSigned:  Willis Modena, MD 02/09/2013 12:24 PM   cc:

## 2013-02-09 NOTE — Anesthesia Preprocedure Evaluation (Addendum)
Anesthesia Evaluation  Patient identified by MRN, date of birth, ID band Patient awake    Reviewed: Allergy & Precautions, H&P , NPO status , Patient's Chart, lab work & pertinent test results  Airway Mallampati: II TM Distance: >3 FB Neck ROM: Full    Dental  (+) Teeth Intact Braces:   Pulmonary neg pulmonary ROS,  breath sounds clear to auscultation  Pulmonary exam normal       Cardiovascular hypertension, negative cardio ROS  Rhythm:Regular Rate:Normal     Neuro/Psych Hx Syncope  Neuromuscular disease negative psych ROS   GI/Hepatic Neg liver ROS, GERD-  Medicated,  Endo/Other  negative endocrine ROS  Renal/GU negative Renal ROS  negative genitourinary   Musculoskeletal negative musculoskeletal ROS (+)   Abdominal   Peds  Hematology negative hematology ROS (+)   Anesthesia Other Findings   Reproductive/Obstetrics negative OB ROS                          Anesthesia Physical Anesthesia Plan  ASA: II  Anesthesia Plan: MAC   Post-op Pain Management:    Induction: Intravenous  Airway Management Planned: Nasal Cannula  Additional Equipment:   Intra-op Plan:   Post-operative Plan:   Informed Consent: I have reviewed the patients History and Physical, chart, labs and discussed the procedure including the risks, benefits and alternatives for the proposed anesthesia with the patient or authorized representative who has indicated his/her understanding and acceptance.   Dental advisory given  Plan Discussed with: CRNA  Anesthesia Plan Comments:         Anesthesia Quick Evaluation

## 2013-02-09 NOTE — Transfer of Care (Signed)
Immediate Anesthesia Transfer of Care Note  Patient: Emily Joseph  Procedure(s) Performed: Procedure(s) with comments: ESOPHAGOGASTRODUODENOSCOPY (EGD) WITH PROPOFOL (N/A) COLONOSCOPY WITH PROPOFOL (N/A) - need ultra thin colon scope BALLOON DILATION (N/A)  Patient Location: PACU  Anesthesia Type:mac  Level of Consciousness: awake, alert  and oriented  Airway & Oxygen Therapy: Patient Spontanous Breathing and Patient connected to nasal cannula oxygen  Post-op Assessment: Report given to PACU RN and Post -op Vital signs reviewed and stable  Post vital signs: Reviewed and stable  Complications: No apparent anesthesia complications

## 2013-02-09 NOTE — Op Note (Signed)
[facility] [address] [place], [address]   ENDOSCOPY PROCEDURE REPORT  PATIENT: Emily Joseph, Emily Joseph  MR#: [id number] BIRTHDATE: 06/02/1971 , 41  yrs. old GENDER: Female ENDOSCOPIST: Willis Modena, MD REFERRED BY:  Carney Living, M.D. PROCEDURE DATE:  02/09/2013 PROCEDURE:  EGD w/ biopsy ASA CLASS:     Class II INDICATIONS:  anemia, dysphagia, abdominal pain. MEDICATIONS: MAC sedation, administered by CRNA TOPICAL ANESTHETIC: Cetacaine Spray  DESCRIPTION OF PROCEDURE: After the risks benefits and alternatives of the procedure were thoroughly explained, informed consent was obtained.  The diagnostic upper      endoscope was introduced through the mouth and advanced to the second portion of the duodenum. Without limitations.  The instrument was slowly withdrawn as the mucosa was fully examined.    Findings:  Linear furrows and trachealization with multiple non-occlusive widely patent rings, in mid-to-distal esophagus, biopsies obtained to evaluate for eosinophilic esophagitis. Esophagus, stomach, pylorus and duodenum to the second portion otherwise normal.  Random duodenal biopsies  obtained to evaluate for celiac sprue.          The scope was then withdrawn from the patient and the procedure completed.  ENDOSCOPIC IMPRESSION:     As above.  Esophageal findings worrisome for eosinophilic esophagitis, which could explain some of patient's dysphagia.  RECOMMENDATIONS:     1.  Watch for potential complications of procedure. 2.  Await biopsy results. 3.  Proceed with colonoscopy today.  eSigned:  Willis Modena, MD 02/09/2013 12:18 PM   CC:

## 2013-02-09 NOTE — H&P (View-Only) (Signed)
  Subjective:    Patient ID: Emily Joseph, female    DOB: 06/18/1971, 41 y.o.   MRN: [id number]  HPI  Anemia Was diagnosed as being anemic (hgb 10?) by Dr Tomlin.  Felt to be due to heavy menstrual periods.  She has not been taking iron but eating green veggies.  Does feel tired no chest pain or recent syncope  Fatigue Associated with chills and sometimes muscle aches.  No fever or weight loss or rashes or focal infections.  Does have pain in a tooth for which she is seeing a dentist.  Saw rheumatologist Dr Devoshaw last spring who did "lots" of blood work was apparently normal.  Also Dr Tomlin has checked her blood recently  Review of Symptoms - see HPI  PMH - Smoking status noted.      Review of Systems     Objective:   Physical Exam  Alert no acute distress Neck:  No deformities, thyromegaly, masses, or tenderness noted.   Supple with full range of motion without pain. Heart - Regular rate and rhythm.  No murmurs, gallops or rubs.    Lungs:  Normal respiratory effort, chest expands symmetrically. Lungs are clear to auscultation, no crackles or wheezes. Extremities:  No cyanosis, edema, or deformity noted with good range of motion of all major joints.         Assessment & Plan:   

## 2013-02-10 ENCOUNTER — Encounter (HOSPITAL_COMMUNITY): Payer: Self-pay | Admitting: Gastroenterology

## 2013-03-15 ENCOUNTER — Other Ambulatory Visit: Payer: Self-pay | Admitting: Gastroenterology

## 2013-03-15 DIAGNOSIS — R1013 Epigastric pain: Secondary | ICD-10-CM

## 2013-03-21 ENCOUNTER — Ambulatory Visit
Admission: RE | Admit: 2013-03-21 | Discharge: 2013-03-21 | Disposition: A | Discharge status: Home / Self Care | Payer: 59 | Source: Ambulatory Visit | Attending: Gastroenterology | Admitting: Gastroenterology

## 2013-03-21 DIAGNOSIS — R1013 Epigastric pain: Secondary | ICD-10-CM

## 2013-04-13 ENCOUNTER — Ambulatory Visit (INDEPENDENT_AMBULATORY_CARE_PROVIDER_SITE_OTHER): Payer: 59 | Admitting: Physician Assistant

## 2013-04-13 VITALS — BP 110/68 | HR 84 | Temp 98.2°F | Resp 16 | Ht 66.75 in | Wt 138.6 lb

## 2013-04-13 DIAGNOSIS — N390 Urinary tract infection, site not specified: Secondary | ICD-10-CM

## 2013-04-13 DIAGNOSIS — R350 Frequency of micturition: Secondary | ICD-10-CM

## 2013-04-13 LAB — POCT UA - MICROSCOPIC ONLY: Mucus, UA: NEGATIVE

## 2013-04-13 LAB — POCT URINALYSIS DIPSTICK
Bilirubin, UA: NEGATIVE
Glucose, UA: NEGATIVE
Ketones, UA: NEGATIVE
Spec Grav, UA: 1.01
Urobilinogen, UA: 0.2

## 2013-04-13 MED ORDER — PHENAZOPYRIDINE HCL 200 MG PO TABS: 200.0000 mg | ORAL_TABLET | Freq: Three times a day (TID) | ORAL | Status: DC | PRN

## 2013-04-13 MED ORDER — NITROFURANTOIN MONOHYD MACRO 100 MG PO CAPS: 100.0000 mg | ORAL_CAPSULE | Freq: Two times a day (BID) | ORAL | Status: DC

## 2013-04-13 NOTE — Progress Notes (Signed)
I was directly involved with the patient care and examined the patient - I agree with the assessment and plan for the patient.

## 2013-04-13 NOTE — Progress Notes (Signed)
  Subjective:    Patient ID: Emily Joseph, female    DOB: 12/10/1970, 42 y.o.   MRN: [id number]  HPI  Presents with urinary frequency, urgency, and burning x 2 days. States prior to the urinary symptoms she had an upset stomach and several episodes of diarrhea. Has a history of frequent UTIs since childhood. Reports seeing blood in urine, but states she has chronic microcytic hematuria. She took Benadryl, Allegra, Tylenol, and Alleve for her urinary symptoms. States the Benadryl helped the most. Denies fever, pain in back, nausea, vomiting. Denies vaginal symptoms.    Review of Systems Denies: fever, headache, dizziness, SOB, chest pain, peripheral edema.     Objective:   Physical Exam  General: WDWN female, appears stated age, no acute distress  Resp: clear to auscultation in all fields bilaterally, no rales/rhonchi/wheezes Cardiac: RRR, S1S2 appreciated, no murmurs/rubs/gallops Abdomen: normal bowel sounds, soft, non distended, mildly tender in suprapubic area, no Murphys, no McBurneys point  Extremities: moves all limbs spontaneously  Neuro: alert & oriented x 3, cranial nerves II-XII grossly intact  Skin: no rashes      Assessment & Plan:   Urinary frequency - Plan: POCT UA - Microscopic Only, POCT urinalysis dipstick  Urinary tract infection, site not specified - Plan: nitrofurantoin, macrocrystal-monohydrate, (MACROBID) 100 MG capsule, phenazopyridine (PYRIDIUM) 200 MG tablet, Urine culture  Take full course of antibiotic (macrobid).  Take pyridium as needed for urinary urgency.  Pt educated to stay hydrated.  Return for follow up if symptoms do not improve in 48 hours.

## 2013-04-16 LAB — URINE CULTURE: Colony Count: >=100000

## 2013-04-21 ENCOUNTER — Other Ambulatory Visit: Payer: Self-pay | Admitting: Obstetrics and Gynecology

## 2013-04-21 DIAGNOSIS — R922 Inconclusive mammogram: Secondary | ICD-10-CM

## 2013-06-15 ENCOUNTER — Ambulatory Visit
Admission: RE | Admit: 2013-06-15 | Discharge: 2013-06-15 | Disposition: A | Discharge status: Home / Self Care | Payer: 59 | Source: Ambulatory Visit | Attending: Obstetrics and Gynecology | Admitting: Obstetrics and Gynecology

## 2013-06-15 DIAGNOSIS — R922 Inconclusive mammogram: Secondary | ICD-10-CM

## 2013-06-15 MED ORDER — GADOBENATE DIMEGLUMINE 529 MG/ML IV SOLN
13.0000 mL | Freq: Once | INTRAVENOUS | Status: AC | PRN
  Administered 2013-06-15: 13 mL via INTRAVENOUS

## 2013-08-10 ENCOUNTER — Encounter: Payer: Self-pay | Admitting: Family Medicine

## 2013-08-10 ENCOUNTER — Ambulatory Visit (INDEPENDENT_AMBULATORY_CARE_PROVIDER_SITE_OTHER): Payer: 59 | Admitting: Family Medicine

## 2013-08-10 VITALS — BP 128/69 | HR 93 | Temp 99.3°F | Ht 66.5 in | Wt 139.0 lb

## 2013-08-10 DIAGNOSIS — B9689 Other specified bacterial agents as the cause of diseases classified elsewhere: Secondary | ICD-10-CM | POA: Insufficient documentation

## 2013-08-10 DIAGNOSIS — J019 Acute sinusitis, unspecified: Secondary | ICD-10-CM

## 2013-08-10 DIAGNOSIS — J209 Acute bronchitis, unspecified: Secondary | ICD-10-CM | POA: Insufficient documentation

## 2013-08-10 MED ORDER — ACETAMINOPHEN-CODEINE #3 300-30 MG PO TABS: 1.0000 | ORAL_TABLET | Freq: Every evening | ORAL | Status: DC | PRN

## 2013-08-10 MED ORDER — AMOXICILLIN-POT CLAVULANATE 875-125 MG PO TABS: 1.0000 | ORAL_TABLET | Freq: Two times a day (BID) | ORAL | Status: DC

## 2013-08-10 NOTE — Progress Notes (Signed)
Emily Joseph is a 42 y.o. female who presents today for sinusitis and cough that has been ongoing since September 15th.  Pt states her family and her took a week long trip at the beginning of the month to [place] and upon return, her kids started to get sick.  She has a total of four, and all of them eventually developed URI type Sx.  Eventually, pt started to have sinus tenderness, rhinorrhea, sinus congestion, post nasal drip turning into productive cough, clear in nature that has regressed.  She has continued to have these over the past two weeks despite trying nasal saline, nasonex, ibuprofen, delsyum, sudafed, green tea, honey.  Does not believe she has had an improvement in her Sx and has developed subjective fevers over that past week as well.  Having trouble sleeping secondary to cough, which is non productive currently.    Past Medical History  Diagnosis Date  . Anemia   . GERD (gastroesophageal reflux disease)   . Problems with swallowing   . Syncope   . Adenomyosis     not papanicolaou smear of cervix and cervical HPV  . Complication of anesthesia     epidural  . Shortness of breath     with allergies  . Chronic kidney disease     hematuria  . Fibromyalgia    Current Outpatient Prescriptions on File Prior to Visit  Medication Sig Dispense Refill  . albuterol (PROVENTIL HFA;VENTOLIN HFA) 108 (90 BASE) MCG/ACT inhaler Inhale 2 puffs into the lungs every 6 (six) hours as needed for wheezing.      . Cholecalciferol (VITAMIN D3) 5000 UNITS TABS Take 20,000 mg by mouth daily.      Marland Kitchen docusate sodium (COLACE) 100 MG capsule Take 200 mg by mouth daily.      . ferrous fumarate (HEMOCYTE - 106 MG FE) 325 (106 FE) MG TABS Take 1 tablet by mouth.      . fexofenadine (ALLEGRA) 180 MG tablet Take 180 mg by mouth daily.      Marland Kitchen ibuprofen (ADVIL,MOTRIN) 200 MG tablet Take 200 mg by mouth every 6 (six) hours as needed for pain.       Marland Kitchen loratadine (CLARITIN) 10 MG tablet Take 10 mg by mouth  daily.      . magnesium 30 MG tablet Take 30 mg by mouth daily.      . nitrofurantoin, macrocrystal-monohydrate, (MACROBID) 100 MG capsule Take 1 capsule (100 mg total) by mouth 2 (two) times daily.  10 capsule  0  . phenazopyridine (PYRIDIUM) 200 MG tablet Take 1 tablet (200 mg total) by mouth 3 (three) times daily as needed for pain.  15 tablet  0  . pseudoephedrine (SUDAFED) 60 MG tablet Take 60 mg by mouth every 4 (four) hours as needed for congestion.       Marland Kitchen zolpidem (AMBIEN) 5 MG tablet Take 1 tablet (5 mg total) by mouth at bedtime as needed for sleep.  30 tablet  0   No current facility-administered medications on file prior to visit.    ROS: Per HPI.  All other systems reviewed and are negative.   Physical Exam Filed Vitals:   08/10/13 0947  BP: 128/69  Pulse: 93  Temp: 99.3 F (37.4 C)    Physical Examination: General appearance - alert, well appearing, and in no distress Mental status - normal mood, behavior, speech, dress, motor activity, and thought processes Eyes - left eye normal, right eye normal Ears - bilateral TM's  and external ear canals normal Nose - sinus tenderness noted maxillary B/L, frontal, + turbinate edema B/L  Mouth - tonsils normal, tongue normal and + postnasal drip/erythema Neck - supple, no significant adenopathy Lymphatics - no palpable lymphadenopathy Chest - clear to auscultation, no wheezes, rales or rhonchi, symmetric air entry Heart - normal rate and regular rhythm, no murmurs noted Skin - normal coloration and turgor, no rashes, no suspicious skin lesions noted

## 2013-08-10 NOTE — Assessment & Plan Note (Signed)
Cough > 5 days with production, clear in nature w/ occasional yellow sputum and no rales/rhonchi/decreased breath sounds.  Having trouble sleeping at night despite delsym around the clock.  Will give limited supply of tylenol 3 to take at night to help sleep.  If no improvement over the next 3-5 days, f/u in clinic for repeat exam and possible CXR.

## 2013-08-10 NOTE — Patient Instructions (Signed)

## 2013-08-10 NOTE — Assessment & Plan Note (Signed)
Pt with sinus pain/pressure for the last 16 days despite trying nasonex, nasal saline, mucinex.   Having subjective fevers as well with rhinitis despite sudafed as well.  Since ongoing for > 2 weeks and no improvement, will tx for bacterial sinusitis w/ augmentin 875 mg BID x  10 days.  If no improvement in 3-5 days, f/u in clinic for repeat exam and consider switching ABx.  Continue with Sx management as well.

## 2013-09-15 ENCOUNTER — Other Ambulatory Visit: Payer: Self-pay

## 2013-09-21 ENCOUNTER — Ambulatory Visit (INDEPENDENT_AMBULATORY_CARE_PROVIDER_SITE_OTHER): Payer: 59 | Admitting: Family Medicine

## 2013-09-21 ENCOUNTER — Encounter: Payer: Self-pay | Admitting: Family Medicine

## 2013-09-21 VITALS — BP 112/75 | HR 78 | Wt 143.0 lb

## 2013-09-21 DIAGNOSIS — D649 Anemia, unspecified: Secondary | ICD-10-CM

## 2013-09-21 DIAGNOSIS — R109 Unspecified abdominal pain: Secondary | ICD-10-CM | POA: Insufficient documentation

## 2013-09-21 DIAGNOSIS — G8929 Other chronic pain: Secondary | ICD-10-CM

## 2013-09-21 DIAGNOSIS — R1011 Right upper quadrant pain: Secondary | ICD-10-CM

## 2013-09-21 DIAGNOSIS — M542 Cervicalgia: Secondary | ICD-10-CM

## 2013-09-21 DIAGNOSIS — R5381 Other malaise: Secondary | ICD-10-CM

## 2013-09-21 DIAGNOSIS — R5383 Other fatigue: Secondary | ICD-10-CM

## 2013-09-21 NOTE — Assessment & Plan Note (Signed)
Most consistent with musculoskeletal pain or nerve irritation.  Does not seem to have any intra-abdomen pathology given recent imaging - CT and [place] and endoscopies.

## 2013-09-21 NOTE — Assessment & Plan Note (Signed)
Recurrent.  Will check labs.  No red flags on history or physical

## 2013-09-21 NOTE — Progress Notes (Signed)
  Subjective:    Patient ID: Emily Joseph, female    DOB: [birth year], 42 y.o.   MRN: [id number]  HPI  Iron Deficiency Low ferritin on last lab check.  Has been taking iron twice daily.  Made her feel a little better.    Fatigue Feels very tired and worn out.  Not much energy.  Waiting to have a hysterectomy for heavy bleeding.  No fevers or chills or weight loss  Tender full area L neck Recently had braces on teeth.  Feels area in mid neck is mildly tender and perhaps full.  No fever or chills or trouble swallowin  Abdomen/Rib pain On right has had focal tender area in right upper quadrant and also in lowest R rib on flank area.  No trauma.  Had normal abdomen CT and [place] and colonoscopy recenlty No nausea or vomiting or bowel changes  Review of Symptoms - see HPI  PMH - Smoking status noted.     Review of Systems     Objective:   Physical Exam No acute distress Neck:  No deformities, thyromegaly, masses, noted.   Supple with full range of motion without pain. Mildly tender over distal L hyoid bone.  No masses Mouth - no lesions, mucous membranes are moist, no decaying teeth  Throat: normal mucosa, no exudate, uvula midline, no redness Heart - Regular rate and rhythm.  No murmurs, gallops or rubs.    Lungs:  Normal respiratory effort, chest expands symmetrically. Lungs are clear to auscultation, no crackles or wheezes. Abdomen: soft and without masses, organomegaly or hernias noted.  No guarding or rebound Does have focal area of tender in RUQ that remains when tenses muscle. R flank lowest posterior rib is tender when palpated.  No mass or deformity felt         Assessment & Plan:

## 2013-09-21 NOTE — Assessment & Plan Note (Signed)
Left side.  No abnormality on exam.  Perhaps due to irritation from braces.  Will monitor

## 2013-09-21 NOTE — Patient Instructions (Signed)
Call in or drop off a copy of your recent Vit D lab  Then come in for blood tests - we will check metabolic , hgb, ferritin, TSH and Vit D  I will call you if your tests are not good.  Otherwise I will send you a letter.  If you do not hear from me with in 2 weeks please call our office.     If the neck fullness get worse or becomes very tender then come back  See your chiropracter for the left upper abdomen pain and back pain

## 2013-10-03 ENCOUNTER — Telehealth: Payer: Self-pay | Admitting: Family Medicine

## 2013-10-03 NOTE — Telephone Encounter (Signed)
LVM for pt to call back.  Please ask for these lab numbers or if she can fax it to [place].  Thanks [employer]

## 2013-10-03 NOTE — Telephone Encounter (Signed)
Please contact her and ask if she can give the number for these tests and need the TSH value.  I will contact her once I can review them  We don't do reverse T3 much any more. Thanks

## 2013-10-03 NOTE — Telephone Encounter (Signed)
Pt called with information for Dr. Deirdre Priest. D3-25 Hydroxy, she had a T3, and T4 and wanted to know if she has to have a reverse T3. Her neck is puffy and swollen. She said she is taking Flovent 220 mcg. Myriam Jacobson

## 2013-10-03 NOTE — Telephone Encounter (Signed)
Pt called back and her values are as follow:  TSH-1.059, T3- 109.9, T4-8.1 and her vitamin D325 -35.  Pt is aware that you will contact her after this is reviewed. Jazmin Hartsell,CMA

## 2013-10-04 NOTE — Telephone Encounter (Signed)
left voicemail to call back if wants to discuss

## 2013-10-14 NOTE — Telephone Encounter (Signed)
Pt returned dr Deirdre Priest phone call. She wants to talk to him about her heart rate and lab results Please call on her cell phone [phone]

## 2013-10-17 NOTE — Telephone Encounter (Signed)
Called left voicemail  She can come in for tests now or wait till i return in 1 weekmtomdiscuss

## 2013-11-25 ENCOUNTER — Telehealth: Payer: Self-pay | Admitting: Family Medicine

## 2013-11-25 ENCOUNTER — Ambulatory Visit (INDEPENDENT_AMBULATORY_CARE_PROVIDER_SITE_OTHER): Payer: 59 | Admitting: Family Medicine

## 2013-11-25 VITALS — BP 114/75 | HR 75 | Temp 97.7°F | Wt 144.0 lb

## 2013-11-25 DIAGNOSIS — D649 Anemia, unspecified: Secondary | ICD-10-CM

## 2013-11-25 DIAGNOSIS — R5381 Other malaise: Secondary | ICD-10-CM

## 2013-11-25 DIAGNOSIS — N3090 Cystitis, unspecified without hematuria: Secondary | ICD-10-CM

## 2013-11-25 DIAGNOSIS — R5383 Other fatigue: Secondary | ICD-10-CM

## 2013-11-25 LAB — POCT URINALYSIS DIPSTICK
BILIRUBIN UA: NEGATIVE
Glucose, UA: NEGATIVE
KETONES UA: NEGATIVE
LEUKOCYTES UA: NEGATIVE
Nitrite, UA: NEGATIVE
PH UA: 6
Protein, UA: NEGATIVE
Spec Grav, UA: 1.02
Urobilinogen, UA: 0.2

## 2013-11-25 LAB — POCT UA - MICROSCOPIC ONLY

## 2013-11-25 LAB — COMPREHENSIVE METABOLIC PANEL
ALBUMIN: 4.1 g/dL (ref 3.5–5.2)
ALT: 18 U/L (ref 0–35)
AST: 13 U/L (ref 0–37)
Alkaline Phosphatase: 55 U/L (ref 39–117)
BUN: 13 mg/dL (ref 6–23)
CALCIUM: 9.2 mg/dL (ref 8.4–10.5)
CHLORIDE: 105 meq/L (ref 96–112)
CO2: 25 mEq/L (ref 19–32)
Creat: 0.65 mg/dL (ref 0.50–1.10)
GLUCOSE: 85 mg/dL (ref 70–99)
POTASSIUM: 4.2 meq/L (ref 3.5–5.3)
SODIUM: 136 meq/L (ref 135–145)
TOTAL PROTEIN: 6.7 g/dL (ref 6.0–8.3)
Total Bilirubin: 0.5 mg/dL (ref 0.3–1.2)

## 2013-11-25 NOTE — Progress Notes (Signed)
   Subjective:    Patient ID: Emily Joseph, female    DOB: [birth year], 43 y.o.   MRN: [id number]  HPI  43 year old m with fibromylagia, HTN, and anemia who presents for eval of cystitis. Started Tuesday AM and she treated with "flushing" and taking benadryl. She believes that advil 400 mg in once day. She denies nausea or vomiting. She has no fever and but intermittent chills.   PMH - She has a history of cystitis and cystoscopy at age 66 and has a history of self-reported microscopic hematuria.   Review of Systems     Objective:   Physical Exam BP 114/75  Pulse 75  Temp(Src) 97.7 F (36.5 C) (Oral)  Wt 144 lb (65.318 kg)  LMP 11/09/2013 Gen: very well appearing CV: RRR, no murmurs Pulm: CAT-B Abd: soft, NDNT Back: chronic right sided rib pain on palpation, no left CVA tenderness     Assessment & Plan:  Cystitis improving  - Possible inflammation due to bath that she took on Tuesday. Otherwise, I will check a urinalysis for evidence of a UTI, but have low index of suspicion for this. Also, we will obtain previous labs ordered by PCP.

## 2013-11-25 NOTE — Telephone Encounter (Signed)
Called pt. LMVM to call back. PLEASE see message. Thank you. Javier Glazier, Gerrit Heck

## 2013-11-25 NOTE — Patient Instructions (Signed)
Emily Joseph,   It was nice to meet you today. It does not appear that you have an active bladder infection one exam. We can get a urinalysis to confirm and follow up with the labs to Dr. Erin Hearing ordered regarding other issues. I will let you know if the urine is concerning for an infection and treat you accordingly.   Take Care,   Dr. Maricela Bo

## 2013-11-25 NOTE — Addendum Note (Signed)
Addended by: [place], Lianni Kanaan on: 11/25/2013 11:53 AM   Modules accepted: Orders

## 2013-11-25 NOTE — Telephone Encounter (Signed)
Called and read message from Dr Maricela Bo to Ms Greeson. She will call back on Monday for the results of her blood work.Busick, Kevin Fenton

## 2013-11-25 NOTE — Telephone Encounter (Signed)
Please call and let pt know that her urinalysis did not show any evidence of infection. Her other labs are not back yet. I will send her a copy of those labs, and she can discuss those with Dr. Erin Hearing.

## 2013-11-26 LAB — CBC
HCT: 36.6 % (ref 36.0–46.0)
HEMOGLOBIN: 11.8 g/dL — AB (ref 12.0–15.0)
MCH: 27.1 pg (ref 26.0–34.0)
MCHC: 32.2 g/dL (ref 30.0–36.0)
MCV: 84.1 fL (ref 78.0–100.0)
PLATELETS: 287 10*3/uL (ref 150–400)
RBC: 4.35 MIL/uL (ref 3.87–5.11)
RDW: 13.9 % (ref 11.5–15.5)
WBC: 7.6 10*3/uL (ref 4.0–10.5)

## 2013-11-26 LAB — FERRITIN: FERRITIN: 13 ng/mL (ref 10–291)

## 2013-11-26 LAB — TSH: TSH: 0.847 u[IU]/mL (ref 0.350–4.500)

## 2013-11-30 ENCOUNTER — Telehealth: Payer: Self-pay | Admitting: Family Medicine

## 2013-11-30 NOTE — Telephone Encounter (Signed)
All of the labs ordered by Dr. Erin Hearing were normal.

## 2013-11-30 NOTE — Telephone Encounter (Signed)
Will forward to MD who saw pt for these labs. Jazmin Hartsell,CMA

## 2013-11-30 NOTE — Telephone Encounter (Signed)
Patient would like to know the results of her lab work from 11/25/13. If she does not answer, she has ok'd to LVM.

## 2013-12-01 NOTE — Telephone Encounter (Signed)
Pt is aware of this.  She would like to know if she can get a reverse T3 done like previously discuss.  Would a call to talk about this.  She is aware that you are not back in clinic til next week.  Jazmin Hartsell,CMA

## 2013-12-05 NOTE — Telephone Encounter (Signed)
Reassured thyroid is in normal range Continue iron Recheck hgb and ferritin in 6 months

## 2014-03-06 ENCOUNTER — Telehealth: Payer: Self-pay | Admitting: Family Medicine

## 2014-03-06 NOTE — Telephone Encounter (Signed)
Needs copy of lab reports done in Jan 2015. Please contact pt when they are ready to be picked up

## 2014-03-06 NOTE — Telephone Encounter (Signed)
Pt is aware that labs were placed up front for pick up. Arlett Goold,CMA

## 2014-04-12 ENCOUNTER — Encounter: Payer: Self-pay | Admitting: Family Medicine

## 2014-04-12 ENCOUNTER — Ambulatory Visit (INDEPENDENT_AMBULATORY_CARE_PROVIDER_SITE_OTHER): Payer: 59 | Admitting: Family Medicine

## 2014-04-12 VITALS — BP 121/77 | HR 69 | Temp 98.1°F | Ht 66.5 in | Wt 143.0 lb

## 2014-04-12 DIAGNOSIS — D509 Iron deficiency anemia, unspecified: Secondary | ICD-10-CM

## 2014-04-12 DIAGNOSIS — IMO0001 Reserved for inherently not codable concepts without codable children: Secondary | ICD-10-CM

## 2014-04-12 NOTE — Assessment & Plan Note (Signed)
I am doubtful that she has Lyme disease given lack of exposure and questionable titers.  I recommend treating iron def first and then perhaps pursuing treatement as recommended by her other provider

## 2014-04-12 NOTE — Progress Notes (Signed)
   Subjective:    Patient ID: Emily Joseph, female    DOB: 08/30/1971, 43 y.o.   MRN: [id number]  HPI  Joint Pains Having worsening pain in joints and muscles.  Seen at [facility] office where many blood tests were drawn.  Lyme titers were intepreted as suggestive of lyme exposure and she was recommended to take doxy and flagyl for up to 6 weeks.  No recalled tick attachment or travel in [place] or bullseye specific rash.  No joint swelling or rash now.  Has had several negative ANA and RF tests   Iron Deficiency Anemia Not able to tolerate iron oral.  Has had several low ferritens recenlty.  Las Hgb was 12 in mid May.  Symptoms consistent with iron deficiency include fatigue, mental fatigue, diffuse muscle pain with no signs of arthritis  Review of Symptoms - see HPI  PMH - Smoking status noted.      Review of Systems     Objective:   Physical Exam Mildly fatigued appearing but alert and interactive Neck:  No deformities, thyromegaly, masses, or tenderness noted.   Supple with full range of motion without pain. Heart - Regular rate and rhythm.  No murmurs, gallops or rubs.    Lungs:  Normal respiratory effort, chest expands symmetrically. Lungs are clear to auscultation, no crackles or wheezes. Skin:  Intact without suspicious lesions or rashes Extremities:  No cyanosis, edema, or deformity noted with good range of motion of all major joints.  Has mild diffuse pain over back muscles. Abdomen: soft and mildly tender diffusely over upper abd without masses, organomegaly or hernias noted.  No guarding or rebound         Assessment & Plan:

## 2014-04-12 NOTE — Patient Instructions (Signed)
Good to see you today!  Thanks for coming in.  I will look up parenteral iron and see if the iron sucrose if ok  I will see if we could order iv iron  I would not take the flagyl if you are treated for iron  Continue as needed tylenol or advil  I will contact you via Mychart or call

## 2014-04-12 NOTE — Assessment & Plan Note (Signed)
I think a number of her current symptoms could be partially caused by iron deficiency.  I recommend her receiving parental iron carefully before pursuing other causes of joint pain - possible Lymes etc.   Her other provider recommended Iron sucrose.

## 2014-04-13 ENCOUNTER — Encounter: Payer: Self-pay | Admitting: Family Medicine

## 2014-05-29 ENCOUNTER — Encounter (HOSPITAL_COMMUNITY): Payer: Self-pay | Admitting: Pharmacy Technician

## 2014-05-30 ENCOUNTER — Other Ambulatory Visit: Payer: Self-pay | Admitting: Obstetrics and Gynecology

## 2014-05-30 DIAGNOSIS — R922 Inconclusive mammogram: Secondary | ICD-10-CM

## 2014-05-31 ENCOUNTER — Encounter (HOSPITAL_COMMUNITY): Payer: Self-pay

## 2014-05-31 ENCOUNTER — Encounter (HOSPITAL_COMMUNITY)
Admission: RE | Admit: 2014-05-31 | Discharge: 2014-05-31 | Disposition: A | Discharge status: Home / Self Care | Payer: 59 | Source: Ambulatory Visit | Attending: Obstetrics and Gynecology | Admitting: Obstetrics and Gynecology

## 2014-05-31 DIAGNOSIS — Z01818 Encounter for other preprocedural examination: Secondary | ICD-10-CM | POA: Insufficient documentation

## 2014-05-31 DIAGNOSIS — Z01812 Encounter for preprocedural laboratory examination: Secondary | ICD-10-CM | POA: Insufficient documentation

## 2014-05-31 LAB — CBC
HEMATOCRIT: 36.1 % (ref 36.0–46.0)
HEMOGLOBIN: 12.2 g/dL (ref 12.0–15.0)
MCH: 29.5 pg (ref 26.0–34.0)
MCHC: 33.8 g/dL (ref 30.0–36.0)
MCV: 87.4 fL (ref 78.0–100.0)
Platelets: 216 10*3/uL (ref 150–400)
RBC: 4.13 MIL/uL (ref 3.87–5.11)
RDW: 13.6 % (ref 11.5–15.5)
WBC: 8.9 10*3/uL (ref 4.0–10.5)

## 2014-05-31 NOTE — Patient Instructions (Signed)
[place]  05/31/2014   Your procedure is scheduled on:  06/14/14  Enter through the Main Entrance of [facility] at [place] up the phone at the desk and dial [phone].   Call this number if you have problems the morning of surgery: [phone]   Remember:   Do not eat food:After Midnight.  Do not drink clear liquids: After Midnight.  Take these medicines the morning of surgery with A SIP OF WATER: Bring inhaler   Do not wear jewelry, make-up or nail polish.  Do not wear lotions, powders, or perfumes. You may wear deodorant.  Do not shave 48 hours prior to surgery.  Do not bring valuables to the hospital.  [facility] is not   responsible for any belongings or valuables brought to the hospital.  Contacts, dentures or bridgework may not be worn into surgery.  Leave suitcase in the car. After surgery it may be brought to your room.  For patients admitted to the hospital, checkout time is 11:00 AM the day of              discharge.   Patients discharged the day of surgery will not be allowed to drive             home.  Name and phone number of your driver: NA  Special Instructions:      Please read over the following fact sheets that you were given:   Surgical Site Infection Prevention

## 2014-05-31 NOTE — Anesthesia Preprocedure Evaluation (Addendum)
Anesthesia Evaluation  Patient identified by MRN, date of birth, ID band Patient awake    Reviewed: Allergy & Precautions, H&P , NPO status , Patient's Chart, lab work & pertinent test results  History of Anesthesia Complications (+) PONV and history of anesthetic complications (low BP with epidural.  Very concerned about staying hydrated because of past issues with orthostatic hypotension/vasovagal near-syncope)  Airway Mallampati: II TM Distance: >3 FB Neck ROM: Full    Dental no notable dental hx.    Pulmonary shortness of breath,  breath sounds clear to auscultation  Pulmonary exam normal       Cardiovascular negative cardio ROS  Rhythm:Regular Rate:Normal     Neuro/Psych  orthostatic hypotension/vasovagal near-syncope negative neurological ROS  negative psych ROS   GI/Hepatic Neg liver ROS, GERD-  ,  Endo/Other  negative endocrine ROS  Renal/GU negative Renal ROS  Female GU complaint  negative genitourinary   Musculoskeletal negative musculoskeletal ROS (+) Fibromyalgia -  Abdominal   Peds negative pediatric ROS (+)  Hematology negative hematology ROS (+) anemia ,   Anesthesia Other Findings Recommended increased PO fluid intake in days prior to surgery to "pre-hydrate."  Pt is a first case start and will receive IV fluid soon after arrival for surgery.  Reassured that we will keep her hydrated with balanced electrolyte solutions (pt very concerned about her hydration status).  Charlton Haws, MD  Reproductive/Obstetrics negative OB ROS                         Anesthesia Physical Anesthesia Plan  ASA: II  Anesthesia Plan: General   Post-op Pain Management:    Induction: Intravenous  Airway Management Planned: Oral ETT  Additional Equipment:   Intra-op Plan:   Post-operative Plan: Extubation in OR  Informed Consent: I have reviewed the patients History and Physical, chart, labs  and discussed the procedure including the risks, benefits and alternatives for the proposed anesthesia with the patient or authorized representative who has indicated his/her understanding and acceptance.   Dental advisory given  Plan Discussed with: CRNA  Anesthesia Plan Comments:       Anesthesia Quick Evaluation                                  Anesthesia Evaluation  Patient identified by MRN, date of birth, ID band Patient awake    Reviewed: Allergy & Precautions, H&P , NPO status , Patient's Chart, lab work & pertinent test results  Airway Mallampati: II TM Distance: >3 FB Neck ROM: Full    Dental  (+) Teeth Intact Braces:   Pulmonary neg pulmonary ROS,  breath sounds clear to auscultation  Pulmonary exam normal       Cardiovascular hypertension, negative cardio ROS  Rhythm:Regular Rate:Normal     Neuro/Psych Hx Syncope  Neuromuscular disease negative psych ROS   GI/Hepatic Neg liver ROS, GERD-  Medicated,  Endo/Other  negative endocrine ROS  Renal/GU negative Renal ROS  negative genitourinary   Musculoskeletal negative musculoskeletal ROS (+)   Abdominal   Peds  Hematology negative hematology ROS (+)   Anesthesia Other Findings   Reproductive/Obstetrics negative OB ROS                          Anesthesia Physical Anesthesia Plan  ASA: II  Anesthesia Plan: MAC   Post-op Pain Management:  Induction: Intravenous  Airway Management Planned: Nasal Cannula  Additional Equipment:   Intra-op Plan:   Post-operative Plan:   Informed Consent: I have reviewed the patients History and Physical, chart, labs and discussed the procedure including the risks, benefits and alternatives for the proposed anesthesia with the patient or authorized representative who has indicated his/her understanding and acceptance.   Dental advisory given  Plan Discussed with: CRNA  Anesthesia Plan Comments:          Anesthesia Quick Evaluation                                    Anesthesia Evaluation  Patient identified by MRN, date of birth, ID band Patient awake    Reviewed: Allergy & Precautions, H&P , NPO status , Patient's Chart, lab work & pertinent test results  Airway Mallampati: II TM Distance: >3 FB Neck ROM: Full    Dental  (+) Teeth Intact Braces:   Pulmonary neg pulmonary ROS,  breath sounds clear to auscultation  Pulmonary exam normal       Cardiovascular hypertension, negative cardio ROS  Rhythm:Regular Rate:Normal     Neuro/Psych Hx Syncope  Neuromuscular disease negative psych ROS   GI/Hepatic Neg liver ROS, GERD-  Medicated,  Endo/Other  negative endocrine ROS  Renal/GU negative Renal ROS  negative genitourinary   Musculoskeletal negative musculoskeletal ROS (+)   Abdominal   Peds  Hematology negative hematology ROS (+)   Anesthesia Other Findings   Reproductive/Obstetrics negative OB ROS                          Anesthesia Physical Anesthesia Plan  ASA: II  Anesthesia Plan: MAC   Post-op Pain Management:    Induction: Intravenous  Airway Management Planned: Nasal Cannula  Additional Equipment:   Intra-op Plan:   Post-operative Plan:   Informed Consent: I have reviewed the patients History and Physical, chart, labs and discussed the procedure including the risks, benefits and alternatives for the proposed anesthesia with the patient or authorized representative who has indicated his/her understanding and acceptance.   Dental advisory given  Plan Discussed with: CRNA  Anesthesia Plan Comments:         Anesthesia Quick Evaluation

## 2014-06-13 NOTE — H&P (Signed)
Emily Joseph is an 43 y.o. female with heavy menses unresponsive to outpatient treatment. EMB benign in 2013.  Pertinent Gynecological History: Menses: flow is excessive with use of many pads or tampons on heaviest days Bleeding: dysfunctional uterine bleeding Contraception: vasectomy DES exposure: denies Blood transfusions: none Sexually transmitted diseases: no past history Previous GYN Procedures: none  Last mammogram: normal Date: 2015 Last pap: normal Date: 2015 OB History: G4, P4   Menstrual History: Menarche age: unknown  No LMP recorded.    Past Medical History  Diagnosis Date  . Anemia   . GERD (gastroesophageal reflux disease)   . Problems with swallowing   . Syncope   . Adenomyosis     not papanicolaou smear of cervix and cervical HPV  . Shortness of breath     with allergies  . Fibromyalgia   . Complication of anesthesia     BP "dropped" during epidural  . PONV (postoperative nausea and vomiting)     Past Surgical History  Procedure Laterality Date  . Appendectomy    . Ovarian cyst removal    . Esophagogastroduodenoscopy (egd) with propofol N/A 02/09/2013    Procedure: ESOPHAGOGASTRODUODENOSCOPY (EGD) WITH PROPOFOL;  Surgeon: Arta Silence, MD;  Location: [facility];  Service: Endoscopy;  Laterality: N/A;  . Colonoscopy with propofol N/A 02/09/2013    Procedure: COLONOSCOPY WITH PROPOFOL;  Surgeon: Arta Silence, MD;  Location: [facility];  Service: Endoscopy;  Laterality: N/A;  need ultra thin colon scope  . Balloon dilation N/A 02/09/2013    Procedure: BALLOON DILATION;  Surgeon: Arta Silence, MD;  Location: [facility];  Service: Endoscopy;  Laterality: N/A;    Family History  Problem Relation Age of Onset  . Cancer Mother   . Cancer Maternal Grandfather   . Stroke Paternal Grandfather   . Kidney disease Paternal Grandfather     Social History:  reports that she has never smoked. She has never used smokeless tobacco. She reports that she  drinks alcohol. She reports that she does not use illicit drugs.  Allergies:  Allergies  Allergen Reactions  . Avocado Shortness Of Breath and Nausea And Vomiting  . Other Other (See Comments)    Patient is allergic to garden peas. Tested positive in allergy testing.  . Peanuts [Peanut Oil] Other (See Comments)    Tested positive on allergy test.  . Ciprofloxacin Other (See Comments)    Severe joint pain  . Demerol [Meperidine] Other (See Comments)    hallucinate   . Latex Swelling  . Macadamia Nut Oil Hives and Swelling    lips swell  . Mango Flavor Swelling    Mango causes swelling of the lips.  . Sulfamethoxazole Other (See Comments)    Upset stomach  . Iodinated Diagnostic Agents Hives    Patient had IVP @ age 72 and broke out in Hives    No prescriptions prior to admission    Review of Systems  Constitutional: Negative for fever.    There were no vitals taken for this visit. Physical Exam  Cardiovascular: Normal rate and regular rhythm.   Respiratory: Effort normal and breath sounds normal.  GI: Soft. There is no tenderness.  Genitourinary:  Uterus 8 weeks size, mobile Adnexa NT without masses    No results found for this or any previous visit (from the past 24 hour(s)).  No results found.  Assessment/Plan: 43 yo G4P4 with menorrhagia. S/P laparotomy with ovarian cystecotmy 1990 LAVH/Bilat salpingectomy, removal of abnormal ovary with open laparoscopy D/W  patient-risks reviewed including infection, organ damage, bleeding/transfusion-HIV/Hep, DVT/PE, pneumonia, pelvic pain, abdominal pain, painful intercourse, return to OR, laparotomy.  Taisley Mordan II,Gladies Sofranko E 06/13/2014, 6:56 PM

## 2014-06-14 ENCOUNTER — Encounter (HOSPITAL_COMMUNITY): Payer: 59 | Admitting: Anesthesiology

## 2014-06-14 ENCOUNTER — Encounter (HOSPITAL_COMMUNITY): Payer: Self-pay | Admitting: *Deleted

## 2014-06-14 ENCOUNTER — Ambulatory Visit (HOSPITAL_COMMUNITY): Payer: 59 | Admitting: Anesthesiology

## 2014-06-14 ENCOUNTER — Observation Stay (HOSPITAL_COMMUNITY)
Admission: RE | Admit: 2014-06-14 | Discharge: 2014-06-15 | Disposition: A | Discharge status: Home / Self Care | Payer: 59 | Source: Ambulatory Visit | Attending: Obstetrics and Gynecology | Admitting: Obstetrics and Gynecology

## 2014-06-14 ENCOUNTER — Encounter (HOSPITAL_COMMUNITY)
Admission: RE | Disposition: A | Discharge status: Home / Self Care | Payer: Self-pay | Source: Ambulatory Visit | Attending: Obstetrics and Gynecology

## 2014-06-14 DIAGNOSIS — IMO0001 Reserved for inherently not codable concepts without codable children: Secondary | ICD-10-CM | POA: Insufficient documentation

## 2014-06-14 DIAGNOSIS — K219 Gastro-esophageal reflux disease without esophagitis: Secondary | ICD-10-CM | POA: Diagnosis not present

## 2014-06-14 DIAGNOSIS — N920 Excessive and frequent menstruation with regular cycle: Principal | ICD-10-CM | POA: Diagnosis present

## 2014-06-14 DIAGNOSIS — N7013 Chronic salpingitis and oophoritis: Secondary | ICD-10-CM | POA: Diagnosis not present

## 2014-06-14 DIAGNOSIS — Z809 Family history of malignant neoplasm, unspecified: Secondary | ICD-10-CM | POA: Insufficient documentation

## 2014-06-14 DIAGNOSIS — N809 Endometriosis, unspecified: Secondary | ICD-10-CM | POA: Diagnosis not present

## 2014-06-14 DIAGNOSIS — D649 Anemia, unspecified: Secondary | ICD-10-CM | POA: Diagnosis not present

## 2014-06-14 DIAGNOSIS — R0602 Shortness of breath: Secondary | ICD-10-CM | POA: Diagnosis not present

## 2014-06-14 DIAGNOSIS — Z823 Family history of stroke: Secondary | ICD-10-CM | POA: Insufficient documentation

## 2014-06-14 DIAGNOSIS — Z841 Family history of disorders of kidney and ureter: Secondary | ICD-10-CM | POA: Insufficient documentation

## 2014-06-14 HISTORY — PX: LAPAROSCOPIC ASSISTED VAGINAL HYSTERECTOMY: SHX5398

## 2014-06-14 HISTORY — PX: BILATERAL SALPINGECTOMY: SHX5743

## 2014-06-14 HISTORY — PX: LYSIS OF ADHESION: SHX5961

## 2014-06-14 SURGERY — HYSTERECTOMY, VAGINAL, LAPAROSCOPY-ASSISTED
Anesthesia: General | Site: Abdomen | Laterality: Right

## 2014-06-14 MED ORDER — DIPHENHYDRAMINE HCL 12.5 MG/5ML PO ELIX
12.5000 mg | ORAL_SOLUTION | Freq: Four times a day (QID) | ORAL | Status: DC | PRN
  Filled 2014-06-14: qty 5

## 2014-06-14 MED ORDER — LACTATED RINGERS IV SOLN
INTRAVENOUS | Status: DC
  Administered 2014-06-14 (×2): via INTRAVENOUS

## 2014-06-14 MED ORDER — DEXAMETHASONE SODIUM PHOSPHATE 10 MG/ML IJ SOLN
INTRAMUSCULAR | Status: AC
  Filled 2014-06-14: qty 1

## 2014-06-14 MED ORDER — CEFAZOLIN SODIUM-DEXTROSE 2-3 GM-% IV SOLR
2.0000 g | INTRAVENOUS | Status: AC
  Administered 2014-06-14: 2 g via INTRAVENOUS

## 2014-06-14 MED ORDER — MIDAZOLAM HCL 2 MG/2ML IJ SOLN
INTRAMUSCULAR | Status: AC
  Filled 2014-06-14: qty 2

## 2014-06-14 MED ORDER — DOCUSATE SODIUM 100 MG PO CAPS
100.0000 mg | ORAL_CAPSULE | Freq: Two times a day (BID) | ORAL | Status: DC
  Administered 2014-06-14 – 2014-06-15 (×2): 100 mg via ORAL
  Filled 2014-06-14 (×2): qty 1

## 2014-06-14 MED ORDER — CEFAZOLIN SODIUM-DEXTROSE 2-3 GM-% IV SOLR
INTRAVENOUS | Status: AC
  Filled 2014-06-14: qty 50

## 2014-06-14 MED ORDER — DIPHENHYDRAMINE HCL 50 MG/ML IJ SOLN: 12.5000 mg | Freq: Four times a day (QID) | INTRAMUSCULAR | Status: DC | PRN

## 2014-06-14 MED ORDER — SCOPOLAMINE 1 MG/3DAYS TD PT72
1.0000 | MEDICATED_PATCH | Freq: Once | TRANSDERMAL | Status: DC
  Administered 2014-06-14: 1.5 mg via TRANSDERMAL

## 2014-06-14 MED ORDER — HYDROMORPHONE 0.3 MG/ML IV SOLN
INTRAVENOUS | Status: DC
  Administered 2014-06-14: 0.999 mg via INTRAVENOUS
  Administered 2014-06-15: 0.2 mg via INTRAVENOUS

## 2014-06-14 MED ORDER — LIDOCAINE HCL (CARDIAC) 20 MG/ML IV SOLN
INTRAVENOUS | Status: DC | PRN
  Administered 2014-06-14: 50 mg via INTRAVENOUS

## 2014-06-14 MED ORDER — ONDANSETRON HCL 4 MG/2ML IJ SOLN
INTRAMUSCULAR | Status: AC
  Filled 2014-06-14: qty 2

## 2014-06-14 MED ORDER — NEOSTIGMINE METHYLSULFATE 10 MG/10ML IV SOLN
INTRAVENOUS | Status: DC | PRN
  Administered 2014-06-14: 3 mg via INTRAVENOUS

## 2014-06-14 MED ORDER — PROPOFOL 10 MG/ML IV BOLUS
INTRAVENOUS | Status: DC | PRN
  Administered 2014-06-14: 160 mg via INTRAVENOUS

## 2014-06-14 MED ORDER — KETOROLAC TROMETHAMINE 30 MG/ML IJ SOLN
INTRAMUSCULAR | Status: DC | PRN
  Administered 2014-06-14: 30 mg via INTRAVENOUS

## 2014-06-14 MED ORDER — HYDROMORPHONE HCL PF 1 MG/ML IJ SOLN
0.2500 mg | INTRAMUSCULAR | Status: DC | PRN
  Administered 2014-06-14: 0.25 mg via INTRAVENOUS

## 2014-06-14 MED ORDER — ONDANSETRON HCL 4 MG PO TABS: 4.0000 mg | ORAL_TABLET | Freq: Four times a day (QID) | ORAL | Status: DC | PRN

## 2014-06-14 MED ORDER — SODIUM CHLORIDE 0.9 % IJ SOLN
INTRAMUSCULAR | Status: AC
  Filled 2014-06-14: qty 50

## 2014-06-14 MED ORDER — ONDANSETRON HCL 4 MG/2ML IJ SOLN
4.0000 mg | Freq: Four times a day (QID) | INTRAMUSCULAR | Status: DC | PRN
  Filled 2014-06-14: qty 2

## 2014-06-14 MED ORDER — KETOROLAC TROMETHAMINE 30 MG/ML IJ SOLN: 15.0000 mg | Freq: Once | INTRAMUSCULAR | Status: DC | PRN

## 2014-06-14 MED ORDER — MIDAZOLAM HCL 2 MG/2ML IJ SOLN
INTRAMUSCULAR | Status: DC | PRN
  Administered 2014-06-14: 2 mg via INTRAVENOUS

## 2014-06-14 MED ORDER — HYDROMORPHONE HCL PF 1 MG/ML IJ SOLN
INTRAMUSCULAR | Status: DC | PRN
  Administered 2014-06-14: 0.5 mg via INTRAVENOUS

## 2014-06-14 MED ORDER — LACTATED RINGERS IV SOLN
INTRAVENOUS | Status: DC
  Administered 2014-06-14 – 2014-06-15 (×3): via INTRAVENOUS

## 2014-06-14 MED ORDER — LIDOCAINE HCL (CARDIAC) 20 MG/ML IV SOLN
INTRAVENOUS | Status: AC
  Filled 2014-06-14: qty 5

## 2014-06-14 MED ORDER — DEXAMETHASONE SODIUM PHOSPHATE 10 MG/ML IJ SOLN
INTRAMUSCULAR | Status: DC | PRN
  Administered 2014-06-14: 10 mg via INTRAVENOUS

## 2014-06-14 MED ORDER — HYDROMORPHONE 0.3 MG/ML IV SOLN
INTRAVENOUS | Status: DC
  Administered 2014-06-14: 2 mL via INTRAVENOUS
  Administered 2014-06-14: 13:00:00 via INTRAVENOUS
  Filled 2014-06-14: qty 25

## 2014-06-14 MED ORDER — OXYCODONE-ACETAMINOPHEN 5-325 MG PO TABS
1.0000 | ORAL_TABLET | ORAL | Status: DC | PRN
  Administered 2014-06-15: 1 via ORAL
  Filled 2014-06-14: qty 1

## 2014-06-14 MED ORDER — GLYCOPYRROLATE 0.2 MG/ML IJ SOLN
INTRAMUSCULAR | Status: DC | PRN
  Administered 2014-06-14: 0.4 mg via INTRAVENOUS

## 2014-06-14 MED ORDER — PROMETHAZINE HCL 25 MG/ML IJ SOLN: 6.2500 mg | INTRAMUSCULAR | Status: DC | PRN

## 2014-06-14 MED ORDER — BUPIVACAINE HCL (PF) 0.5 % IJ SOLN
INTRAMUSCULAR | Status: DC | PRN
  Administered 2014-06-14: 30 mL

## 2014-06-14 MED ORDER — ALBUTEROL SULFATE (2.5 MG/3ML) 0.083% IN NEBU: INHALATION_SOLUTION | Freq: Four times a day (QID) | RESPIRATORY_TRACT | Status: DC | PRN

## 2014-06-14 MED ORDER — ONDANSETRON HCL 4 MG/2ML IJ SOLN
INTRAMUSCULAR | Status: DC | PRN
  Administered 2014-06-14: 4 mg via INTRAVENOUS

## 2014-06-14 MED ORDER — ROCURONIUM BROMIDE 100 MG/10ML IV SOLN
INTRAVENOUS | Status: AC
  Filled 2014-06-14: qty 1

## 2014-06-14 MED ORDER — BUPIVACAINE HCL (PF) 0.5 % IJ SOLN
INTRAMUSCULAR | Status: AC
  Filled 2014-06-14: qty 30

## 2014-06-14 MED ORDER — MENTHOL 3 MG MT LOZG: 1.0000 | LOZENGE | OROMUCOSAL | Status: DC | PRN

## 2014-06-14 MED ORDER — SODIUM CHLORIDE 0.9 % IJ SOLN: 9.0000 mL | INTRAMUSCULAR | Status: DC | PRN

## 2014-06-14 MED ORDER — HYDROMORPHONE HCL PF 1 MG/ML IJ SOLN
INTRAMUSCULAR | Status: AC
  Filled 2014-06-14: qty 1

## 2014-06-14 MED ORDER — PROPOFOL 10 MG/ML IV EMUL
INTRAVENOUS | Status: AC
  Filled 2014-06-14: qty 20

## 2014-06-14 MED ORDER — NALOXONE HCL 0.4 MG/ML IJ SOLN: 0.4000 mg | INTRAMUSCULAR | Status: DC | PRN

## 2014-06-14 MED ORDER — FENTANYL CITRATE 0.05 MG/ML IJ SOLN
INTRAMUSCULAR | Status: AC
  Filled 2014-06-14: qty 5

## 2014-06-14 MED ORDER — SCOPOLAMINE 1 MG/3DAYS TD PT72
MEDICATED_PATCH | TRANSDERMAL | Status: AC
  Administered 2014-06-14: 1.5 mg via TRANSDERMAL
  Filled 2014-06-14: qty 1

## 2014-06-14 MED ORDER — ONDANSETRON HCL 4 MG/2ML IJ SOLN: 4.0000 mg | Freq: Four times a day (QID) | INTRAMUSCULAR | Status: DC | PRN

## 2014-06-14 MED ORDER — HYDROMORPHONE HCL PF 1 MG/ML IJ SOLN
INTRAMUSCULAR | Status: AC
  Administered 2014-06-14: 0.25 mg via INTRAVENOUS
  Filled 2014-06-14: qty 1

## 2014-06-14 MED ORDER — LACTATED RINGERS IR SOLN
Status: DC | PRN
  Administered 2014-06-14: 3000 mL

## 2014-06-14 MED ORDER — ROCURONIUM BROMIDE 100 MG/10ML IV SOLN
INTRAVENOUS | Status: DC | PRN
  Administered 2014-06-14: 50 mg via INTRAVENOUS

## 2014-06-14 MED ORDER — IBUPROFEN 600 MG PO TABS: 600.0000 mg | ORAL_TABLET | Freq: Four times a day (QID) | ORAL | Status: DC | PRN

## 2014-06-14 MED ORDER — MAGNESIUM HYDROXIDE 400 MG/5ML PO SUSP: 30.0000 mL | Freq: Every day | ORAL | Status: DC | PRN

## 2014-06-14 MED ORDER — FENTANYL CITRATE 0.05 MG/ML IJ SOLN
INTRAMUSCULAR | Status: DC | PRN
  Administered 2014-06-14: 100 ug via INTRAVENOUS
  Administered 2014-06-14: 150 ug via INTRAVENOUS

## 2014-06-14 MED ORDER — ALUM & MAG HYDROXIDE-SIMETH 200-200-20 MG/5ML PO SUSP: 30.0000 mL | ORAL | Status: DC | PRN

## 2014-06-14 MED ORDER — ZOLPIDEM TARTRATE 5 MG PO TABS: 5.0000 mg | ORAL_TABLET | Freq: Every evening | ORAL | Status: DC | PRN

## 2014-06-14 SURGICAL SUPPLY — 46 items
BLADE SURG 10 STRL SS (BLADE) ×3 IMPLANT
BLADE SURG 11 STRL SS (BLADE) ×6 IMPLANT
CABLE HIGH FREQUENCY MONO STRZ (ELECTRODE) IMPLANT
CATH ROBINSON RED A/P 16FR (CATHETERS) ×3 IMPLANT
CLOTH BEACON ORANGE TIMEOUT ST (SAFETY) ×3 IMPLANT
CONT PATH 16OZ SNAP LID 3702 (MISCELLANEOUS) ×3 IMPLANT
COVER TABLE BACK 60X90 (DRAPES) ×3 IMPLANT
DECANTER SPIKE VIAL GLASS SM (MISCELLANEOUS) ×3 IMPLANT
DERMABOND ADVANCED (GAUZE/BANDAGES/DRESSINGS) ×1
DERMABOND ADVANCED .7 DNX12 (GAUZE/BANDAGES/DRESSINGS) ×2 IMPLANT
DRSG COVADERM PLUS 2X2 (GAUZE/BANDAGES/DRESSINGS) ×6 IMPLANT
DRSG OPSITE POSTOP 3X4 (GAUZE/BANDAGES/DRESSINGS) ×3 IMPLANT
DURAPREP 26ML APPLICATOR (WOUND CARE) ×3 IMPLANT
ELECT LIGASURE LONG (ELECTRODE) IMPLANT
ELECT REM PT RETURN 9FT ADLT (ELECTROSURGICAL)
ELECTRODE REM PT RTRN 9FT ADLT (ELECTROSURGICAL) IMPLANT
FILTER SMOKE EVAC LAPAROSHD (FILTER) ×3 IMPLANT
GLOVE BIO SURGEON STRL SZ7 (GLOVE) ×9 IMPLANT
GLOVE BIOGEL PI IND STRL 6.5 (GLOVE) ×2 IMPLANT
GLOVE BIOGEL PI IND STRL 7.0 (GLOVE) ×4 IMPLANT
GLOVE BIOGEL PI INDICATOR 6.5 (GLOVE) ×1
GLOVE BIOGEL PI INDICATOR 7.0 (GLOVE) ×2
GOWN STRL REUS W/ TWL LRG LVL3 (GOWN DISPOSABLE) ×14 IMPLANT
GOWN STRL REUS W/TWL LRG LVL3 (GOWN DISPOSABLE) ×7
NEEDLE INSUFFLATION 120MM (ENDOMECHANICALS) ×3 IMPLANT
NS IRRIG 1000ML POUR BTL (IV SOLUTION) ×3 IMPLANT
PACK LAVH (CUSTOM PROCEDURE TRAY) ×3 IMPLANT
PROTECTOR NERVE ULNAR (MISCELLANEOUS) ×3 IMPLANT
SCISSORS LAP 5X45 EPIX DISP (ENDOMECHANICALS) IMPLANT
SEALER TISSUE G2 CVD JAW 45CM (ENDOMECHANICALS) ×3 IMPLANT
SET CYSTO W/LG BORE CLAMP LF (SET/KITS/TRAYS/PACK) IMPLANT
SET IRRIG TUBING LAPAROSCOPIC (IRRIGATION / IRRIGATOR) IMPLANT
STRIP CLOSURE SKIN 1/4X3 (GAUZE/BANDAGES/DRESSINGS) IMPLANT
SUT MNCRL 0 MO-4 VIOLET 18 CR (SUTURE) ×6 IMPLANT
SUT MNCRL 0 VIOLET 6X18 (SUTURE) ×2 IMPLANT
SUT MONOCRYL 0 6X18 (SUTURE) ×1
SUT MONOCRYL 0 MO 4 18  CR/8 (SUTURE) ×3
SUT VIC AB 4-0 PS2 27 (SUTURE) ×3 IMPLANT
SUT VICRYL 0 UR6 27IN ABS (SUTURE) IMPLANT
SYR 30ML LL (SYRINGE) ×3 IMPLANT
TOWEL OR 17X24 6PK STRL BLUE (TOWEL DISPOSABLE) ×6 IMPLANT
TRAY FOLEY CATH 14FR (SET/KITS/TRAYS/PACK) ×3 IMPLANT
TROCAR XCEL NON-BLD 11X100MML (ENDOMECHANICALS) ×3 IMPLANT
TROCAR XCEL NON-BLD 5MMX100MML (ENDOMECHANICALS) ×3 IMPLANT
WARMER LAPAROSCOPE (MISCELLANEOUS) ×3 IMPLANT
WATER STERILE IRR 1000ML POUR (IV SOLUTION) ×3 IMPLANT

## 2014-06-14 NOTE — Progress Notes (Signed)
Good pain relief, tolerating regular diet, ambulating, no flatus yet  VSS Afeb UO clear Lungs CTA Cor RRR Abd soft, BS + Ext PAS on  A: Satisfactory  P: per orders

## 2014-06-14 NOTE — Anesthesia Procedure Notes (Signed)
Procedure Name: Intubation Date/Time: 06/14/2014 7:29 AM Performed by: Jonna Munro Pre-anesthesia Checklist: Patient being monitored, Suction available, Emergency Drugs available, Patient identified and Timeout performed Patient Re-evaluated:Patient Re-evaluated prior to inductionOxygen Delivery Method: Circle system utilized Preoxygenation: Pre-oxygenation with 100% oxygen Intubation Type: IV induction Laryngoscope Size: Mac and 3 Grade View: Grade I Tube type: Oral Tube size: 7.0 mm Number of attempts: 1 Airway Equipment and Method: Stylet Placement Confirmation: ETT inserted through vocal cords under direct vision,  positive ETCO2 and breath sounds checked- equal and bilateral Secured at: 20 cm Tube secured with: Tape Dental Injury: Teeth and Oropharynx as per pre-operative assessment

## 2014-06-14 NOTE — Anesthesia Postprocedure Evaluation (Signed)
  Anesthesia Post-op Note  Patient: Emily Joseph  Procedure(s) Performed: Procedure(s): OPEN LAPAROSCOPIC ASSISTED VAGINAL HYSTERECTOMY (Right) BILATERAL SALPINGECTOMY (N/A) LYSIS OF ADHESION (N/A)  Patient Location: [facility]  Anesthesia Type:General  Level of Consciousness: awake, alert , oriented and patient cooperative  Airway and Oxygen Therapy: Patient Spontanous Breathing and Patient connected to nasal cannula oxygen  Post-op Pain: mild  Post-op Assessment: Patient's Cardiovascular Status Stable, Respiratory Function Stable, Patent Airway, No signs of Nausea or vomiting, Adequate PO intake, Pain level controlled and No headache  Post-op Vital Signs: Reviewed and stable  Last Vitals:  Filed Vitals:   06/14/14 1531  BP: 110/68  Pulse: 88  Temp:   Resp:     Complications: No apparent anesthesia complications

## 2014-06-14 NOTE — Op Note (Signed)
NAMEMODESTY, RUDY NO.:  [id number]  MEDICAL RECORD NO.:  [id number]  LOCATION:  [facility]                          FACILITY:  [place]  PHYSICIAN:  Daleen Bo. Gaetano Net, M.D. DATE OF BIRTH:  25-Jun-1971  DATE OF PROCEDURE:  06/14/2014 DATE OF DISCHARGE:                              OPERATIVE REPORT   PREOPERATIVE DIAGNOSIS:  Menorrhagia.  POSTOPERATIVE DIAGNOSIS: 1. Menorrhagia. 2. Endometriosis. 3. Abdominopelvic adhesions.  PROCEDURE:  Laparoscopically-assisted vaginal hysterectomy with bilateral salpingectomy and lysis of adhesions.  SURGEON:  Daleen Bo. Gaetano Net, MD  ANESTHESIA:  General endotracheal intubation.  ESTIMATED BLOOD LOSS:  100 mL.  INDICATIONS AND CONSENT:  This patient is a 43 year old, multiparous female, with heavy menses.  Details are dictated in history and physical.  She has a history of right ovarian cystectomy, laparoscopically assisted vaginal hysterectomy with bilateral salpingectomy, possible right salpingo-oophorectomy has been discussed with the patient preoperatively.  Potential risks and complications were reviewed preoperatively including, but not limited to, infection, organ damage, bleeding requiring transfusion of blood products with HIV and hepatitis acquisition, DVT, PE, pneumonia, fistula formation, pelvic pain, abdominal pain, pain with intercourse, laparotomy and return to the operating room.  All questions have been answered and consent is signed on the chart.  FINDINGS:  Upper abdomen is grossly normal.  Below the level of the umbilicus, there is a row of adhesions of the epiploicae from the bowel to the anterior abdominal wall in the right lower quadrant.  In the pelvis, the uterus is about 10 weeks in size, smooth in contour.  Right fallopian tube is slightly adherent to the ovary.  Left tube is normal.  PROCEDURE IN DETAIL:  The patient was taken to the operating room where she was identified, placed in dorsal supine  position.  General anesthesia was induced via endotracheal intubation.  She was placed in dorsal lithotomy position.  Time-out was undertaken.  She was prepped abdominally and vaginally, bladder straight catheterized.  Hulka tenaculum was placed.  Uterus was then manipulated and she was draped in a sterile fashion.  The infraumbilical and suprapubic areas were injected in the midline with approximately 6 mL of 0.25% plain Marcaine. An infraumbilical incision was made.  Dissection was carried down to the anterior fascia.  This was grasped with 2 Kocher clamps and elevated. Fascia was then entered with scissors.  The peritoneum was successfully entered without difficulty.  A 0 Vicryl was placed in a pursestring fashion around the fascial incision and held.  A disposable Hasson trocar sleeve was placed.  Pneumoperitoneum was induced and inspection with the laparoscope revealed proper placement and no damage to surrounding structures.  Small suprapubic incision was made and a 5-mm disposable trocar sleeve was placed under direct visualization without difficulty.  Using the Enseal bipolar cautery cutting instrument, the adhesions to the right lower quadrant were taken down at the point of insertion of the abdominal wall, well clear of the bowel.  Good hemostasis was maintained.  The right fallopian tube was then taken down to the level of the uterus.  The proximal utero-ovarian and round ligaments were then taken down and dissection was carried out to the vesicouterine peritoneum.  Similar procedure was carried out on the left.  Vesicouterine peritoneum was then taken down cephalolaterally. Good hemostasis was again noted.  Suprapubic trocar sleeve was removed. Instruments were removed and attention was turned to the vagina. Posterior cul-de-sac was entered sharply.  Cervix was circumscribed with unipolar cautery.  Mucosa was advanced sharply and bluntly.  Anterior cul-de-sac was entered  without difficulty.  Then, using the LigaSure bipolar cautery handheld instrument, the uterosacral ligaments were taken down followed by the bladder pillars, cardinal ligaments, uterine vessels bilaterally.  Fundus was delivered posteriorly and the remaining pedicles were taken down.  All suture will be 0 Monocryl unless, otherwise designated.  Uterosacral ligaments plicated the vaginal cuff bilaterally and then plicated the midline with a third suture.  Cuff was closed with figure-of-eight's.  Foley catheter was then placed in the bladder and clear urine was noted.  Attention was then returned to the abdomen.  Pneumoperitoneum was recreated and the suprapubic trocar sleeve was reintroduced under direct visualization.  Minor bleeding at peritoneal edges was controlled with bipolar cautery.  Copious irrigation was carried out and inspection under reduced pneumo reveals continued hemostasis.  The remaining approximately 24 mL of 0.5% plain Marcaine was instilled in the peritoneal cavity.  Suprapubic trocar sleeve was removed and the umbilical trocar sleeve was removed.  The fascial incision was tied down and inspection revealed good closure of the fascia.  Skin was closed with interrupted 2-0 Vicryl suture on both incisions and glue was applied to both as well.  All counts were correct.  The patient was awake and taken to recovery room in stable condition.     Daleen Bo Gaetano Net, M.D.     JET/MEDQ  D:  06/14/2014  T:  06/14/2014  Job:  [id number]

## 2014-06-14 NOTE — Addendum Note (Signed)
Addendum created 06/14/14 1613 by Brock Ra, CRNA   Modules edited: Notes Section   Notes Section:  File: [id number]

## 2014-06-14 NOTE — Transfer of Care (Signed)
Immediate Anesthesia Transfer of Care Note  Patient: Emily Joseph  Procedure(s) Performed: Procedure(s): OPEN LAPAROSCOPIC ASSISTED VAGINAL HYSTERECTOMY (Right) BILATERAL SALPINGECTOMY (N/A) LYSIS OF ADHESION (N/A)  Patient Location: PACU  Anesthesia Type:General  Level of Consciousness: awake, alert  and oriented  Airway & Oxygen Therapy: Patient Spontanous Breathing and Patient connected to nasal cannula oxygen  Post-op Assessment: Report given to PACU RN and Post -op Vital signs reviewed and stable  Post vital signs: Reviewed and stable  Complications: No apparent anesthesia complications

## 2014-06-14 NOTE — Anesthesia Postprocedure Evaluation (Signed)
Anesthesia Post Note  Patient: Emily Joseph  Procedure(s) Performed: Procedure(s) (LRB): OPEN LAPAROSCOPIC ASSISTED VAGINAL HYSTERECTOMY (Right) BILATERAL SALPINGECTOMY (N/A) LYSIS OF ADHESION (N/A)  Anesthesia type: General  Patient location: PACU  Post pain: Pain level controlled  Post assessment: Post-op Vital signs reviewed  Last Vitals:  Filed Vitals:   06/14/14 0909  BP: 116/65  Pulse: 91  Temp: 36.8 C  Resp: 14    Post vital signs: Reviewed  Level of consciousness: sedated  Complications: No apparent anesthesia complications

## 2014-06-14 NOTE — Brief Op Note (Signed)
06/14/2014  8:58 AM  PATIENT:  Emily Joseph  42 y.o. female  PRE-OPERATIVE DIAGNOSIS:  Menorrhagia  POST-OPERATIVE DIAGNOSIS:  Menorrhagia; Endometriosis  PROCEDURE:  Procedure(s): OPEN LAPAROSCOPIC ASSISTED VAGINAL HYSTERECTOMY (Right) BILATERAL SALPINGECTOMY (N/A) LYSIS OF ADHESION (N/A)  SURGEON:  Surgeon(s) and Role:    * Allena Katz, MD - Primary    * Linda Hedges, DO - Assisting  PHYSICIAN ASSISTANT:   ASSISTANTS: none   ANESTHESIA:   general  EBL:  Total I/O In: 1000 [I.V.:1000] Out: 250 [Urine:150; Blood:100]  BLOOD ADMINISTERED:none  DRAINS: Urinary Catheter (Foley)   LOCAL MEDICATIONS USED:  MARCAINE     SPECIMEN:  Source of Specimen:  uterus and bilateral tubes  DISPOSITION OF SPECIMEN:  PATHOLOGY  COUNTS:  YES  TOURNIQUET:  * No tourniquets in log *  DICTATION: .Other Dictation: Dictation Number [id number]  PLAN OF CARE: Admit to inpatient   PATIENT DISPOSITION:  PACU - hemodynamically stable.   Delay start of Pharmacological VTE agent (>24hrs) due to surgical blood loss or risk of bleeding: not applicable

## 2014-06-14 NOTE — Progress Notes (Signed)
No changes to H&P per patient history Reviewed procedure-LAVH/bilat salpingectomy, poss RSO All questions answered.

## 2014-06-15 ENCOUNTER — Encounter (HOSPITAL_COMMUNITY): Payer: Self-pay | Admitting: Obstetrics and Gynecology

## 2014-06-15 DIAGNOSIS — N920 Excessive and frequent menstruation with regular cycle: Secondary | ICD-10-CM | POA: Diagnosis not present

## 2014-06-15 LAB — CBC
HEMATOCRIT: 30.7 % — AB (ref 36.0–46.0)
HEMOGLOBIN: 10.1 g/dL — AB (ref 12.0–15.0)
MCH: 28.3 pg (ref 26.0–34.0)
MCHC: 32.9 g/dL (ref 30.0–36.0)
MCV: 86 fL (ref 78.0–100.0)
Platelets: 170 10*3/uL (ref 150–400)
RBC: 3.57 MIL/uL — AB (ref 3.87–5.11)
RDW: 13.3 % (ref 11.5–15.5)
WBC: 10.6 10*3/uL — ABNORMAL HIGH (ref 4.0–10.5)

## 2014-06-15 MED ORDER — IBUPROFEN 600 MG PO TABS: 600.0000 mg | ORAL_TABLET | Freq: Four times a day (QID) | ORAL | Status: DC | PRN

## 2014-06-15 MED ORDER — OXYCODONE-ACETAMINOPHEN 5-325 MG PO TABS: 1.0000 | ORAL_TABLET | Freq: Four times a day (QID) | ORAL | Status: DC | PRN

## 2014-06-15 NOTE — Discharge Summary (Signed)
Physician Discharge Summary  Patient ID: Emily Joseph MRN: [id number] DOB/AGE: August 15, 1971 43 y.o.  Admit date: 06/14/2014 Discharge date: 06/15/2014  Admission Diagnoses:mennorhagia  Discharge Diagnoses:  Active Problems:   Menorrhagia   Discharged Condition: good  Hospital Course: Good resumption of bowel and bladder function, tolerating regular diet, ambulating, good pain relief  Consults: None  Significant Diagnostic Studies: labs:  Results for orders placed during the hospital encounter of 06/14/14 (from the past 24 hour(s))  CBC     Status: Abnormal   Collection Time    06/15/14  5:16 AM      Result Value Ref Range   WBC 10.6 (*) 4.0 - 10.5 K/uL   RBC 3.57 (*) 3.87 - 5.11 MIL/uL   Hemoglobin 10.1 (*) 12.0 - 15.0 g/dL   HCT 30.7 (*) 36.0 - 46.0 %   MCV 86.0  78.0 - 100.0 fL   MCH 28.3  26.0 - 34.0 pg   MCHC 32.9  30.0 - 36.0 g/dL   RDW 13.3  11.5 - 15.5 %   Platelets 170  150 - 400 K/uL    Treatments: surgery: LAVH, bilateral salpingectomy  Discharge Exam: Blood pressure 93/49, pulse 82, temperature 99.3 F (37.4 C), temperature source Oral, resp. rate 16, height 5' 7.5" (1.715 m), weight 64.411 kg (142 lb), SpO2 100.00%. General appearance: alert, cooperative and no distress GI: soft, non-tender; bowel sounds normal; no masses,  no organomegaly  Disposition: 81-Discharged to home/self-care with a planned acute care hospital inpt readmission     Medication List    STOP taking these medications       magnesium 30 MG tablet      TAKE these medications       albuterol 108 (90 BASE) MCG/ACT inhaler  Commonly known as:  PROVENTIL HFA;VENTOLIN HFA  Inhale 2 puffs into the lungs every 6 (six) hours as needed for wheezing.     B-12 PO  Take 1 tablet by mouth daily.     docusate sodium 100 MG capsule  Commonly known as:  COLACE  Take 200 mg by mouth daily as needed for moderate constipation.     fexofenadine 180 MG tablet  Commonly known as:  ALLEGRA   Take 180 mg by mouth daily.     ibuprofen 600 MG tablet  Commonly known as:  ADVIL,MOTRIN  Take 1 tablet (600 mg total) by mouth every 6 (six) hours as needed (mild pain).     OVER THE COUNTER MEDICATION  Take 5 mLs by mouth 2 (two) times daily. Patient is taking Floradix supplement.     OVER THE COUNTER MEDICATION  Take 1 capsule by mouth daily. Patient takes methylated folate.     oxyCODONE-acetaminophen 5-325 MG per tablet  Commonly known as:  PERCOCET/ROXICET  Take 1-2 tablets by mouth every 6 (six) hours as needed for severe pain (moderate to severe pain (when tolerating fluids)).         Signed: Quest Tavenner II,Kishan Wachsmuth E 06/15/2014, 2:26 PM

## 2014-06-15 NOTE — Progress Notes (Signed)
Good pain relief, ambulating, +flatus, voiding  VSS Afeb Abd soft, good BS  Results for orders placed during the hospital encounter of 06/14/14 (from the past 24 hour(s))  CBC     Status: Abnormal   Collection Time    06/15/14  5:16 AM      Result Value Ref Range   WBC 10.6 (*) 4.0 - 10.5 K/uL   RBC 3.57 (*) 3.87 - 5.11 MIL/uL   Hemoglobin 10.1 (*) 12.0 - 15.0 g/dL   HCT 30.7 (*) 36.0 - 46.0 %   MCV 86.0  78.0 - 100.0 fL   MCH 28.3  26.0 - 34.0 pg   MCHC 32.9  30.0 - 36.0 g/dL   RDW 13.3  11.5 - 15.5 %   Platelets 170  150 - 400 K/uL    A: Satisfactory  P: D/C home

## 2014-06-15 NOTE — Discharge Instructions (Signed)
No vaginal entry °

## 2014-06-15 NOTE — Progress Notes (Signed)
pt out in wheelchair  Teaching complete

## 2014-07-04 ENCOUNTER — Inpatient Hospital Stay (HOSPITAL_COMMUNITY)
Admission: AD | Admit: 2014-07-04 | Discharge: 2014-07-04 | Disposition: A | Discharge status: Home / Self Care | Payer: 59 | Source: Ambulatory Visit | Attending: Obstetrics and Gynecology | Admitting: Obstetrics and Gynecology

## 2014-07-04 ENCOUNTER — Encounter (HOSPITAL_COMMUNITY): Payer: Self-pay | Admitting: *Deleted

## 2014-07-04 DIAGNOSIS — K219 Gastro-esophageal reflux disease without esophagitis: Secondary | ICD-10-CM | POA: Insufficient documentation

## 2014-07-04 DIAGNOSIS — R5383 Other fatigue: Secondary | ICD-10-CM | POA: Diagnosis not present

## 2014-07-04 DIAGNOSIS — R5381 Other malaise: Secondary | ICD-10-CM | POA: Insufficient documentation

## 2014-07-04 DIAGNOSIS — R002 Palpitations: Secondary | ICD-10-CM | POA: Insufficient documentation

## 2014-07-04 LAB — CBC
HEMATOCRIT: 38.1 % (ref 36.0–46.0)
Hemoglobin: 13.1 g/dL (ref 12.0–15.0)
MCH: 29.3 pg (ref 26.0–34.0)
MCHC: 34.4 g/dL (ref 30.0–36.0)
MCV: 85.2 fL (ref 78.0–100.0)
Platelets: 274 10*3/uL (ref 150–400)
RBC: 4.47 MIL/uL (ref 3.87–5.11)
RDW: 13.2 % (ref 11.5–15.5)
WBC: 11.2 10*3/uL — ABNORMAL HIGH (ref 4.0–10.5)

## 2014-07-04 LAB — COMPREHENSIVE METABOLIC PANEL
ALK PHOS: 87 U/L (ref 39–117)
ALT: 17 U/L (ref 0–35)
ANION GAP: 11 (ref 5–15)
AST: 14 U/L (ref 0–37)
Albumin: 4.1 g/dL (ref 3.5–5.2)
BUN: 11 mg/dL (ref 6–23)
CALCIUM: 9.4 mg/dL (ref 8.4–10.5)
CO2: 27 mEq/L (ref 19–32)
Chloride: 102 mEq/L (ref 96–112)
Creatinine, Ser: 0.6 mg/dL (ref 0.50–1.10)
GFR calc non Af Amer: >90 mL/min (ref >90)
GLUCOSE: 97 mg/dL (ref 70–99)
Potassium: 3.9 mEq/L (ref 3.7–5.3)
Sodium: 140 mEq/L (ref 137–147)
TOTAL PROTEIN: 7.3 g/dL (ref 6.0–8.3)
Total Bilirubin: 0.3 mg/dL (ref 0.3–1.2)

## 2014-07-04 LAB — URINE MICROSCOPIC-ADD ON

## 2014-07-04 LAB — URINALYSIS, ROUTINE W REFLEX MICROSCOPIC
BILIRUBIN URINE: NEGATIVE
Glucose, UA: NEGATIVE mg/dL
Ketones, ur: NEGATIVE mg/dL
Leukocytes, UA: NEGATIVE
Nitrite: NEGATIVE
Protein, ur: NEGATIVE mg/dL
Specific Gravity, Urine: <1.005 — ABNORMAL LOW (ref 1.005–1.030)
UROBILINOGEN UA: 0.2 mg/dL (ref 0.0–1.0)
pH: 5.5 (ref 5.0–8.0)

## 2014-07-04 MED ORDER — LACTATED RINGERS IV BOLUS (SEPSIS)
250.0000 mL | Freq: Once | INTRAVENOUS | Status: AC
  Administered 2014-07-04: 250 mL via INTRAVENOUS

## 2014-07-04 MED ORDER — LACTATED RINGERS IV SOLN
INTRAVENOUS | Status: DC
  Administered 2014-07-04: 17:00:00 via INTRAVENOUS

## 2014-07-04 NOTE — MAU Provider Note (Signed)
History     CSN: [id number]  Arrival date and time: 07/04/14 1646   First Provider Initiated Contact with Patient 07/04/14 1748      Chief Complaint  Patient presents with  . Shaking  . Fatigue  . Chills  . Palpitations   HPI Pt is not pregnant s/p hyst on 8/5/205 by dr. Gaetano Net.  Pt has hx of syncope episodes with vasal vagal reactions. Pt felt good since surgery until Sunday night and started feeling shaky and weak and headache.  Pt Headache continued until this morning and went away after Tylenol 700mg . Pt has had nausea but no vomiting.  Pt has frequent soft bowel movements. Pt has had hx of irritable bowel with chronic constipation.  Pt has been taking Miralax but has not taken since Friday.  Pt denies pain with urination.  Pt has hx of hematuria and frequent UTIs and has had urethra stretching.    Past Medical History  Diagnosis Date  . Anemia   . GERD (gastroesophageal reflux disease)   . Problems with swallowing   . Syncope   . Adenomyosis     not papanicolaou smear of cervix and cervical HPV  . Shortness of breath     with allergies  . Fibromyalgia   . Complication of anesthesia     BP "dropped" during epidural  . PONV (postoperative nausea and vomiting)     Past Surgical History  Procedure Laterality Date  . Appendectomy    . Ovarian cyst removal    . Esophagogastroduodenoscopy (egd) with propofol N/A 02/09/2013    Procedure: ESOPHAGOGASTRODUODENOSCOPY (EGD) WITH PROPOFOL;  Surgeon: Arta Silence, MD;  Location: [facility];  Service: Endoscopy;  Laterality: N/A;  . Colonoscopy with propofol N/A 02/09/2013    Procedure: COLONOSCOPY WITH PROPOFOL;  Surgeon: Arta Silence, MD;  Location: [facility];  Service: Endoscopy;  Laterality: N/A;  need ultra thin colon scope  . Balloon dilation N/A 02/09/2013    Procedure: BALLOON DILATION;  Surgeon: Arta Silence, MD;  Location: [facility];  Service: Endoscopy;  Laterality: N/A;  . Laparoscopic assisted vaginal  hysterectomy Right 06/14/2014    Procedure: OPEN LAPAROSCOPIC ASSISTED VAGINAL HYSTERECTOMY;  Surgeon: Allena Katz, MD;  Location: [facility];  Service: Gynecology;  Laterality: Right;  . Bilateral salpingectomy N/A 06/14/2014    Procedure: BILATERAL SALPINGECTOMY;  Surgeon: Allena Katz, MD;  Location: [facility];  Service: Gynecology;  Laterality: N/A;  . Lysis of adhesion N/A 06/14/2014    Procedure: LYSIS OF ADHESION;  Surgeon: Allena Katz, MD;  Location: [facility];  Service: Gynecology;  Laterality: N/A;    Family History  Problem Relation Age of Onset  . Cancer Mother   . Cancer Maternal Grandfather   . Stroke Paternal Grandfather   . Kidney disease Paternal Grandfather     History  Substance Use Topics  . Smoking status: Never Smoker   . Smokeless tobacco: Never Used  . Alcohol Use: Yes     Comment: 3 glasses wine per week    Allergies:  Allergies  Allergen Reactions  . Avocado Shortness Of Breath and Nausea And Vomiting  . Other Other (See Comments)    Patient is allergic to garden peas. Tested positive in allergy testing.  . Peanuts [Peanut Oil] Other (See Comments)    Tested positive on allergy test.  . Ciprofloxacin Other (See Comments)    Severe joint pain  . Demerol [Meperidine] Other (See Comments)    hallucinate   .  Latex Swelling  . Macadamia Nut Oil Hives and Swelling    lips swell  . Mango Flavor Swelling    Mango causes swelling of the lips.  . Sulfamethoxazole Other (See Comments)    Upset stomach  . Iodinated Diagnostic Agents Hives    Patient had IVP @ age 34 and broke out in Hives    Prescriptions prior to admission  Medication Sig Dispense Refill  . albuterol (PROVENTIL HFA;VENTOLIN HFA) 108 (90 BASE) MCG/ACT inhaler Inhale 2 puffs into the lungs every 6 (six) hours as needed for wheezing.      . Cyanocobalamin (B-12 PO) Take 1 tablet by mouth daily.      Marland Kitchen docusate sodium (COLACE) 100 MG capsule Take 200 mg by mouth daily as needed for  moderate constipation.       . fexofenadine (ALLEGRA) 180 MG tablet Take 180 mg by mouth daily.      Marland Kitchen ibuprofen (ADVIL,MOTRIN) 600 MG tablet Take 1 tablet (600 mg total) by mouth every 6 (six) hours as needed (mild pain).  30 tablet  0  . OVER THE COUNTER MEDICATION Take 5 mLs by mouth 2 (two) times daily. Patient is taking Floradix supplement.      Marland Kitchen OVER THE COUNTER MEDICATION Take 1 capsule by mouth daily. Patient takes methylated folate.      Marland Kitchen oxyCODONE-acetaminophen (PERCOCET/ROXICET) 5-325 MG per tablet Take 1-2 tablets by mouth every 6 (six) hours as needed for severe pain (moderate to severe pain (when tolerating fluids)).  30 tablet  0    Review of Systems  Constitutional: Positive for chills and diaphoresis. Negative for fever.  HENT:       Resolved  Cardiovascular: Positive for palpitations. Negative for chest pain.  Gastrointestinal: Positive for heartburn, nausea and abdominal pain. Negative for vomiting.       Right lower quadrant pain-not now  Genitourinary: Negative for dysuria and urgency.  Neurological: Positive for dizziness and headaches.   Physical Exam   Blood pressure 124/71, pulse 80, temperature 98.2 F (36.8 C), temperature source Oral, resp. rate 18, height 5' 7.5" (1.715 m), weight 142 lb (64.411 kg).  Physical Exam  MAU Course  Procedures Dr. Gaetano Net here to see pt  Assessment and Plan   LINEBERRY,SUSAN 07/04/2014, 5:49 PM

## 2014-07-04 NOTE — MAU Note (Signed)
C/o feeling shaky,weak,chills and malaise for past 3 days; had a hysterectomy on 06-14-14;sent from GYN to MAU;

## 2014-07-04 NOTE — Progress Notes (Signed)
43 yo S/P LAVH 06/14/14. Over last 2-3 days feels "shaky" and "clammy". Temp of 98.9 at home. Having BM 2-3 x/day with history of IBS. Voiding well. Poor appetite today but no N/V. Did not sleep well last pm after taking 1/2 Benadryl. History of hypotensive episodes but no syncope since surgery.  VSS Afeb Skin W&D Lungs CTA Cor RRR Abd soft, good BS, incisions healing well LE-NT without cords  EKG NSR O2 sat 100% on RA (patient declined ABG)  Results for orders placed during the hospital encounter of 07/04/14 (from the past 24 hour(s))  URINALYSIS, ROUTINE W REFLEX MICROSCOPIC     Status: Abnormal   Collection Time    07/04/14  5:05 PM      Result Value Ref Range   Color, Urine YELLOW  YELLOW   APPearance CLEAR  CLEAR   Specific Gravity, Urine <1.005 (*) 1.005 - 1.030   pH 5.5  5.0 - 8.0   Glucose, UA NEGATIVE  NEGATIVE mg/dL   Hgb urine dipstick LARGE (*) NEGATIVE   Bilirubin Urine NEGATIVE  NEGATIVE   Ketones, ur NEGATIVE  NEGATIVE mg/dL   Protein, ur NEGATIVE  NEGATIVE mg/dL   Urobilinogen, UA 0.2  0.0 - 1.0 mg/dL   Nitrite NEGATIVE  NEGATIVE   Leukocytes, UA NEGATIVE  NEGATIVE  URINE MICROSCOPIC-ADD ON     Status: None   Collection Time    07/04/14  5:05 PM      Result Value Ref Range   Squamous Epithelial / LPF RARE  RARE   WBC, UA 0-2  <3 WBC/hpf   RBC / HPF 7-10  <3 RBC/hpf   Bacteria, UA RARE  RARE  CBC     Status: Abnormal   Collection Time    07/04/14  5:13 PM      Result Value Ref Range   WBC 11.2 (*) 4.0 - 10.5 K/uL   RBC 4.47  3.87 - 5.11 MIL/uL   Hemoglobin 13.1  12.0 - 15.0 g/dL   HCT 38.1  36.0 - 46.0 %   MCV 85.2  78.0 - 100.0 fL   MCH 29.3  26.0 - 34.0 pg   MCHC 34.4  30.0 - 36.0 g/dL   RDW 13.2  11.5 - 15.5 %   Platelets 274  150 - 400 K/uL  COMPREHENSIVE METABOLIC PANEL     Status: None   Collection Time    07/04/14  5:13 PM      Result Value Ref Range   Sodium 140  137 - 147 mEq/L   Potassium 3.9  3.7 - 5.3 mEq/L   Chloride 102  96 - 112  mEq/L   CO2 27  19 - 32 mEq/L   Glucose, Bld 97  70 - 99 mg/dL   BUN 11  6 - 23 mg/dL   Creatinine, Ser 0.60  0.50 - 1.10 mg/dL   Calcium 9.4  8.4 - 10.5 mg/dL   Total Protein 7.3  6.0 - 8.3 g/dL   Albumin 4.1  3.5 - 5.2 g/dL   AST 14  0 - 37 U/L   ALT 17  0 - 35 U/L   Alkaline Phosphatase 87  39 - 117 U/L   Total Bilirubin 0.3  0.3 - 1.2 mg/dL   GFR calc non Af Amer >90  >90 mL/min   GFR calc Af Amer >90  >90 mL/min   Anion gap 11  5 - 15   S/P IV fluids  Feeling better  A/P: Probable exhaustion  Ambien 5mg , #20, NR - will take 1/2 tablet, side effects discussed (she had Ambien when having PTL without side effects)         Will get something good to eat         Her husband is returning home from out of town work this evening to watch kids         FU as scheduled in office or prn symptoms

## 2014-08-04 ENCOUNTER — Encounter: Payer: Self-pay | Admitting: Neurology

## 2014-08-07 ENCOUNTER — Ambulatory Visit (INDEPENDENT_AMBULATORY_CARE_PROVIDER_SITE_OTHER): Payer: 59 | Admitting: Neurology

## 2014-08-07 VITALS — BP 129/67 | HR 83 | Temp 98.6°F | Resp 18 | Wt 141.8 lb

## 2014-08-07 DIAGNOSIS — M545 Low back pain, unspecified: Secondary | ICD-10-CM

## 2014-08-07 DIAGNOSIS — M62838 Other muscle spasm: Secondary | ICD-10-CM

## 2014-08-07 MED ORDER — LIDOCAINE 5 % EX PTCH: 1.0000 | MEDICATED_PATCH | CUTANEOUS | Status: DC

## 2014-08-07 MED ORDER — CARISOPRODOL 350 MG PO TABS: 350.0000 mg | ORAL_TABLET | Freq: Four times a day (QID) | ORAL | Status: DC | PRN

## 2014-08-07 MED ORDER — LIDOCAINE 5 % EX PTCH: 3.0000 | MEDICATED_PATCH | CUTANEOUS | Status: DC

## 2014-08-07 NOTE — Progress Notes (Addendum)
[facility]    Provider:  Dr Jaynee Eagles Referring Provider: Lind Covert, * Primary Care Physician:  Lind Covert, MD  CC:  Back pain  HPI:  Emily Joseph is a 43 y.o. female here as a referral from Dr. Erin Hearing for Back Pain  Patient reports that she lifted a heavy object and went into a back spasm 4 weeks ago, right after a hysterectomy when her abdominal muscles were weakened. Since then her back is bothering her, tight, spasms. Has 4 kids. Had a hysterectomy recently and is recovering from surgery.  Left leg is shorter than right leg 3/8 inches and has been in chiropractic care for 11 years. Has hardness and tighness in the back lumbar paraspinals. She describes as tightness, discomfort, spasms. Also spasms in right knee. Has tingling on the bottom of the right leg and now some in the left. Back spasms and pain are worse in the morning, better with moving around. Was taking NSAIDs daily and recently stopped - was taking alleve and advil daily which helped a little bit. Took tylenol yesterday. Back symptoms are worsening and causing difficultly because she is healing from hysterectomy as well. Has anxiety after pulling her back and since having the surgery, has been a tough time. LBP worse when sitting too long, muscles cramps up. No pain with bearing down or valsalva. Pain gets severe to 10/10, muscle spasms last several hours daily. No radicular symptoms. No weakness. No bowel or bladder changes.   Reviewed notes, labs and imaging from outside physicians, which showed: LAVH/bilateral salpingectomy on 06/14/14. Was given flexeril for the spasms which sedated her for 15 hours. Xanax helped with anxiety. CMP 07/04/14 unremarkable  Review of Systems: Patient complains of symptoms per HPI as well as the following symptoms denies weakness, denies vision changes, denies CP, cough. Pertinent negatives per HPI. All others negative.   History   Social History  .  Marital Status: Married    Spouse Name: N/A    Number of Children: N/A  . Years of Education: N/A   Occupational History  . Not on file.   Social History Main Topics  . Smoking status: Never Smoker   . Smokeless tobacco: Never Used  . Alcohol Use: Yes     Comment: 3 glasses wine per week  . Drug Use: No  . Sexual Activity: Not on file   Other Topics Concern  . Not on file   Social History Narrative   Social History:      [name] - husband; [phone] -daughter; Micah 2002- son [name] 2003 daughter [name] 2008 daughter           Family History  Problem Relation Age of Onset  . Cancer Mother   . Cancer Maternal Grandfather   . Stroke Paternal Grandfather   . Kidney disease Paternal Grandfather     Past Medical History  Diagnosis Date  . Anemia   . GERD (gastroesophageal reflux disease)   . Problems with swallowing   . Syncope   . Adenomyosis     not papanicolaou smear of cervix and cervical HPV  . Shortness of breath     with allergies  . Fibromyalgia   . Complication of anesthesia     BP "dropped" during epidural  . PONV (postoperative nausea and vomiting)     Past Surgical History  Procedure Laterality Date  . Appendectomy    . Ovarian cyst removal    . Esophagogastroduodenoscopy (egd) with propofol N/A 02/09/2013  Procedure: ESOPHAGOGASTRODUODENOSCOPY (EGD) WITH PROPOFOL;  Surgeon: Arta Silence, MD;  Location: [facility];  Service: Endoscopy;  Laterality: N/A;  . Colonoscopy with propofol N/A 02/09/2013    Procedure: COLONOSCOPY WITH PROPOFOL;  Surgeon: Arta Silence, MD;  Location: [facility];  Service: Endoscopy;  Laterality: N/A;  need ultra thin colon scope  . Balloon dilation N/A 02/09/2013    Procedure: BALLOON DILATION;  Surgeon: Arta Silence, MD;  Location: [facility];  Service: Endoscopy;  Laterality: N/A;  . Laparoscopic assisted vaginal hysterectomy Right 06/14/2014    Procedure: OPEN LAPAROSCOPIC ASSISTED VAGINAL HYSTERECTOMY;   Surgeon: Allena Katz, MD;  Location: [facility];  Service: Gynecology;  Laterality: Right;  . Bilateral salpingectomy N/A 06/14/2014    Procedure: BILATERAL SALPINGECTOMY;  Surgeon: Allena Katz, MD;  Location: [facility];  Service: Gynecology;  Laterality: N/A;  . Lysis of adhesion N/A 06/14/2014    Procedure: LYSIS OF ADHESION;  Surgeon: Allena Katz, MD;  Location: [facility];  Service: Gynecology;  Laterality: N/A;    Current Outpatient Prescriptions  Medication Sig Dispense Refill  . albuterol (PROVENTIL HFA;VENTOLIN HFA) 108 (90 BASE) MCG/ACT inhaler Inhale 2 puffs into the lungs every 6 (six) hours as needed for wheezing.      . carisoprodol (SOMA) 350 MG tablet Take 1 tablet (350 mg total) by mouth 4 (four) times daily as needed for muscle spasms.  60 tablet  0  . Cyanocobalamin (B-12 PO) Take 1 tablet by mouth daily.      Marland Kitchen docusate sodium (COLACE) 100 MG capsule Take 200 mg by mouth daily as needed for moderate constipation.       . fexofenadine (ALLEGRA) 180 MG tablet Take 180 mg by mouth daily.      Marland Kitchen ibuprofen (ADVIL,MOTRIN) 600 MG tablet Take 1 tablet (600 mg total) by mouth every 6 (six) hours as needed (mild pain).  30 tablet  0  . lidocaine (LIDODERM) 5 % Place 3 patches onto the skin daily. Remove & Discard patch within 12 hours or as directed by MD. 12 hours on, 12 hours off. Place on affected areas up to 3 patches daily  90 patch  0  . OVER THE COUNTER MEDICATION Take 5 mLs by mouth 2 (two) times daily. Patient is taking Floradix supplement.      Marland Kitchen OVER THE COUNTER MEDICATION Take 1 capsule by mouth daily. Patient takes methylated folate.       No current facility-administered medications for this visit.    Allergies as of 08/07/2014 - Review Complete 08/04/2014  Allergen Reaction Noted  . Avocado Shortness Of Breath and Nausea And Vomiting 01/26/2013  . Other Other (See Comments) 05/29/2014  . Peanuts [peanut oil] Other (See Comments) 01/26/2013  . Ciprofloxacin  Other (See Comments) 04/13/2013  . Demerol [meperidine] Other (See Comments) 02/07/2013  . Latex Swelling 05/29/2014  . Macadamia nut oil Hives and Swelling 02/07/2013  . Mango flavor Swelling 05/29/2014  . Sulfamethoxazole Other (See Comments) 09/03/2006  . Iodinated diagnostic agents Hives 10/25/2012    Filed Vitals:   08/08/14 1909  BP: 129/67  Pulse: 83  Temp: 98.6 F (37 C)  Resp: 18      Physical exam: Exam: Gen: NAD, Teary and anxious, conversant, well nourised, well groomed                     CV: RRR, no MRG. No Carotid Bruits. No peripheral edema, warm, nontender Eyes: Conjunctivae clear without exudates or  hemorrhage  Neuro: Detailed Neurologic Exam  Speech:    Speech is normal; fluent and spontaneous with normal comprehension.  Cognition:    The patient is oriented to person, place, and time;     recent and remote memory intact;     language fluent;     normal attention, concentration,    fund of knowledge Cranial Nerves:    The pupils are equal, round, and reactive to light. The fundi are normal and spontaneous venous pulsations are present. Visual fields are full to finger confrontation. Extraocular movements are intact. Trigeminal sensation is intact and the muscles of mastication are normal. The face is symmetric. The palate elevates in the midline. Voice is normal. Shoulder shrug is normal. The tongue has normal motion without fasciculations.   Coordination:    Normal finger to nose and heel to shin.   Gait:    Heel-toe and tandem gait are normal.   Motor Observation:    No asymmetry, no atrophy, and no involuntary movements noted. Tone:    Normal muscle tone.    Posture:    Posture is normal. normal erect    Strength:    Strength is V/V in the upper and lower limbs.      Sensation: intact     Reflex Exam:  DTR's:    Deep tendon reflexes in the upper and lower extremities are normal bilaterally.   Toes:    The toes are downgoing  bilaterally.   Clonus:    Clonus is absent.    Assessment/Plan:  43 year old female who is here for severe muscle spasms after lifting a heavy object in the setting of abdominal/pelvic surgery. Most likely mechanical strain. Neuro exam is normal. Reassured patient as she was very teary and anxious, has 4 children and is very busy. Will order physical therapy for low back pain. Will also give Soma as it tends to be less sedating than Flexeril. Also topical lidocaine patches. Can follow up in 3 months or sooner if necessary.    Sarina Ill, MD  [facility] [address] [place], [place] [address]  Phone [phone] Fax [phone] Lenor Coffin

## 2014-08-08 ENCOUNTER — Encounter: Payer: Self-pay | Admitting: Neurology

## 2014-08-09 ENCOUNTER — Encounter: Payer: Self-pay | Admitting: Neurology

## 2014-08-09 DIAGNOSIS — M545 Low back pain, unspecified: Secondary | ICD-10-CM | POA: Insufficient documentation

## 2014-08-11 ENCOUNTER — Ambulatory Visit: Payer: 59 | Attending: Neurology | Admitting: Physical Therapy

## 2014-08-11 DIAGNOSIS — Z5189 Encounter for other specified aftercare: Secondary | ICD-10-CM | POA: Diagnosis not present

## 2014-08-11 DIAGNOSIS — M62838 Other muscle spasm: Secondary | ICD-10-CM | POA: Diagnosis not present

## 2014-08-11 DIAGNOSIS — M545 Low back pain: Secondary | ICD-10-CM | POA: Diagnosis not present

## 2014-08-11 DIAGNOSIS — Z90710 Acquired absence of both cervix and uterus: Secondary | ICD-10-CM | POA: Diagnosis not present

## 2014-08-15 ENCOUNTER — Ambulatory Visit: Payer: 59 | Admitting: Physical Therapy

## 2014-08-15 DIAGNOSIS — Z5189 Encounter for other specified aftercare: Secondary | ICD-10-CM | POA: Diagnosis not present

## 2014-08-17 ENCOUNTER — Ambulatory Visit: Payer: 59 | Admitting: Physical Therapy

## 2014-08-17 DIAGNOSIS — Z5189 Encounter for other specified aftercare: Secondary | ICD-10-CM | POA: Diagnosis not present

## 2014-08-22 ENCOUNTER — Ambulatory Visit: Payer: 59 | Admitting: Physical Therapy

## 2014-08-22 DIAGNOSIS — Z5189 Encounter for other specified aftercare: Secondary | ICD-10-CM | POA: Diagnosis not present

## 2014-08-25 ENCOUNTER — Ambulatory Visit: Payer: 59 | Admitting: Physical Therapy

## 2014-08-25 DIAGNOSIS — Z5189 Encounter for other specified aftercare: Secondary | ICD-10-CM | POA: Diagnosis not present

## 2014-08-29 ENCOUNTER — Ambulatory Visit: Payer: 59 | Admitting: Physical Therapy

## 2014-08-29 DIAGNOSIS — Z5189 Encounter for other specified aftercare: Secondary | ICD-10-CM | POA: Diagnosis not present

## 2014-08-31 ENCOUNTER — Encounter: Payer: Self-pay | Admitting: Family Medicine

## 2014-08-31 ENCOUNTER — Ambulatory Visit (INDEPENDENT_AMBULATORY_CARE_PROVIDER_SITE_OTHER): Payer: 59 | Admitting: Family Medicine

## 2014-08-31 ENCOUNTER — Ambulatory Visit: Payer: 59 | Admitting: Physical Therapy

## 2014-08-31 VITALS — BP 139/89 | HR 109 | Temp 98.1°F | Ht 67.5 in | Wt 142.6 lb

## 2014-08-31 DIAGNOSIS — J0100 Acute maxillary sinusitis, unspecified: Secondary | ICD-10-CM

## 2014-08-31 DIAGNOSIS — J329 Chronic sinusitis, unspecified: Secondary | ICD-10-CM | POA: Insufficient documentation

## 2014-08-31 DIAGNOSIS — Z5189 Encounter for other specified aftercare: Secondary | ICD-10-CM | POA: Diagnosis not present

## 2014-08-31 MED ORDER — FLUTICASONE PROPIONATE 50 MCG/ACT NA SUSP: 2.0000 | Freq: Every day | NASAL | Status: DC

## 2014-08-31 MED ORDER — AZITHROMYCIN 250 MG PO TABS: ORAL_TABLET | ORAL | Status: DC

## 2014-08-31 NOTE — Patient Instructions (Signed)
It looks like you have a sinus infection, take the antibiotics for this.   Continue your nasal saline and claritin Start flonase for the heaviest part of al;lergy season.

## 2014-08-31 NOTE — Assessment & Plan Note (Signed)
Considering facial tenderness, chills, and severity of symptoms will go ahead and treat with azithromycin Add flonase to regimen Continue nasal saline and antihistamine Follow up with PCP as needed.  Tachycardia likely due to albuterol use

## 2014-08-31 NOTE — Progress Notes (Signed)
Patient ID: Emily Joseph, female   DOB: November 04, 1971, 43 y.o.   MRN: [id number]   HPI  Patient presents today for concern for sinus infection   patient states that it began 2 weeks ago with nasal congestion that has been steadily worsening after a trip to the mountains that she feels is due to allergies. Over the last 3 days she's developed right-sided facial pain, chills, and worsening congestion.  She denies any decrease in her oral intake. She did have mild dyspnea this morning that resolved easily with albuterol. She also took a Claritin this morning, she uses nasal saline washes 3 times a day, she previously used a nasal steroid as well which she has stopped and she requests a prescription for today. She also notes a headache she attributes to congestion which is helped by Tylenol.   For pain she's been taking Tylenol and ibuprofen with minimal relief.   Smoking status noted ROS: Per HPI  Objective: BP 139/89  Pulse 109  Temp(Src) 98.1 F (36.7 C) (Oral)  Ht 5' 7.5" (1.715 m)  Wt 142 lb 9.6 oz (64.683 kg)  BMI 21.99 kg/m2  LMP 05/20/2014 Gen: NAD, alert, cooperative with exam HEENT: NCAT, TMs normal bilaterally, nares with swollen turbinates bilaterally, OP clear, right-sided maxillary and frontal sinus tenderness to palpation CV: RRR, good S1/S2, no murmur Resp: CTABL, no wheezes, non-labored Neuro: Alert and oriented, No gross deficits  Assessment and plan:  Sinus infection Considering facial tenderness, chills, and severity of symptoms will go ahead and treat with azithromycin Add flonase to regimen Continue nasal saline and antihistamine Follow up with PCP as needed.  Tachycardia likely due to albuterol use   Meds ordered this encounter  Medications  . azithromycin (ZITHROMAX) 250 MG tablet    Sig: Take 2 on day 1 and then 1 pill per day until you run out.    Dispense:  6 tablet    Refill:  0  . fluticasone (FLONASE) 50 MCG/ACT nasal spray    Sig: Place 2  sprays into both nostrils daily.    Dispense:  16 g    Refill:  6

## 2014-09-05 ENCOUNTER — Ambulatory Visit: Payer: 59 | Admitting: Physical Therapy

## 2014-09-05 DIAGNOSIS — Z5189 Encounter for other specified aftercare: Secondary | ICD-10-CM | POA: Diagnosis not present

## 2014-09-07 ENCOUNTER — Ambulatory Visit: Payer: 59 | Admitting: Physical Therapy

## 2014-09-07 DIAGNOSIS — Z5189 Encounter for other specified aftercare: Secondary | ICD-10-CM | POA: Diagnosis not present

## 2014-09-11 ENCOUNTER — Encounter: Payer: Self-pay | Admitting: Family Medicine

## 2014-09-26 ENCOUNTER — Ambulatory Visit: Payer: 59 | Admitting: Neurology

## 2014-09-28 ENCOUNTER — Other Ambulatory Visit: Payer: Self-pay | Admitting: Obstetrics and Gynecology

## 2014-09-28 DIAGNOSIS — N6459 Other signs and symptoms in breast: Secondary | ICD-10-CM

## 2014-09-28 DIAGNOSIS — N644 Mastodynia: Secondary | ICD-10-CM

## 2014-10-04 ENCOUNTER — Ambulatory Visit
Admission: RE | Admit: 2014-10-04 | Discharge: 2014-10-04 | Disposition: A | Discharge status: Home / Self Care | Payer: 59 | Source: Ambulatory Visit | Attending: Obstetrics and Gynecology | Admitting: Obstetrics and Gynecology

## 2014-10-04 DIAGNOSIS — N6459 Other signs and symptoms in breast: Secondary | ICD-10-CM

## 2014-10-04 DIAGNOSIS — N644 Mastodynia: Secondary | ICD-10-CM

## 2014-10-09 ENCOUNTER — Ambulatory Visit (INDEPENDENT_AMBULATORY_CARE_PROVIDER_SITE_OTHER): Payer: 59 | Admitting: Neurology

## 2014-10-09 ENCOUNTER — Encounter: Payer: Self-pay | Admitting: Neurology

## 2014-10-09 VITALS — BP 103/70 | HR 72 | Ht 67.5 in | Wt 142.6 lb

## 2014-10-09 DIAGNOSIS — M5441 Lumbago with sciatica, right side: Secondary | ICD-10-CM

## 2014-10-09 DIAGNOSIS — Q72812 Congenital shortening of left lower limb: Secondary | ICD-10-CM

## 2014-10-09 DIAGNOSIS — M542 Cervicalgia: Secondary | ICD-10-CM | POA: Insufficient documentation

## 2014-10-09 DIAGNOSIS — R198 Other specified symptoms and signs involving the digestive system and abdomen: Secondary | ICD-10-CM | POA: Insufficient documentation

## 2014-10-09 DIAGNOSIS — M25559 Pain in unspecified hip: Secondary | ICD-10-CM | POA: Insufficient documentation

## 2014-10-09 DIAGNOSIS — M25551 Pain in right hip: Secondary | ICD-10-CM

## 2014-10-09 DIAGNOSIS — M5442 Lumbago with sciatica, left side: Secondary | ICD-10-CM

## 2014-10-09 DIAGNOSIS — R29898 Other symptoms and signs involving the musculoskeletal system: Secondary | ICD-10-CM

## 2014-10-09 DIAGNOSIS — M549 Dorsalgia, unspecified: Secondary | ICD-10-CM | POA: Insufficient documentation

## 2014-10-09 DIAGNOSIS — N8189 Other female genital prolapse: Secondary | ICD-10-CM

## 2014-10-09 NOTE — Patient Instructions (Signed)
Overall you are doing fairly well but I do want to suggest a few things today:   Remember to drink plenty of fluid, eat healthy meals and do not skip any meals. Try to eat protein with a every meal and eat a healthy snack such as fruit or nuts in between meals. Try to keep a regular sleep-wake schedule and try to exercise daily, particularly in the form of walking, 20-30 minutes a day, if you can.   As far as your medications are concerned, I would like to suggest  As far as diagnostic testing:   I would like to see you back in 3 months, sooner if we need to. Please call us with any interim questions, concerns, problems, updates or refill requests.   Please also call us for any test results so we can go over those with you on the phone.  My clinical assistant and will answer any of your questions and relay your messages to me and also relay most of my messages to you.   Our phone number is [phone]. We also have an after hours call service for urgent matters and there is a physician on-call for urgent questions. For any emergencies you know to call 911 or go to the nearest emergency room

## 2014-10-09 NOTE — Progress Notes (Addendum)
[facility]    Provider:  Dr Jaynee Eagles Referring Provider: Lind Covert, * Primary Care Physician:  Lind Covert, MD  CC:  Back pain, leg weakness, abdominal weakness   HPI:  Emily Joseph is a 43 y.o. female here as a follow up for low back pain, weakness.    She has had a few physical therapy sessions but has not finished therapy. Went to [facility] on [address] and had Malachy Mood, was very happy. She feels a little stronger, back spasms are better. Abdominal muscles feel weak and she can't hold them in still. She still has some ankle swelling, spasms in the low back. She still has some numbness and tingling in the feet, She has cramps in the feet. Heat helps. She still has tenderness in the low back and buttocks. She is very weak in the left leg still. She also has chronic neck pain and muscle pain.  Previous appointment:  Patient reports that she lifted a heavy object and went into a back spasm 4 months ago, right after a hysterectomy when her abdominal muscles were weakened. Since then her back is bothering her, tight, spasms. Has 4 kids. Had a hysterectomy recently and is recovering from surgery. Left leg is shorter than right leg 3/8 inches and has been in chiropractic care for 11 years. Has hardness and tighness in the back lumbar paraspinals. She describes as tightness, discomfort, spasms. Also spasms in right knee. Has tingling on the bottom of the right leg and now some in the left. Back spasms and pain are worse in the morning, better with moving around. Was taking NSAIDs daily and recently stopped - was taking alleve and advil daily which helped a little bit. Took tylenol yesterday. Back symptoms are worsening and causing difficultly because she is healing from hysterectomy as well. Has anxiety after pulling her back and since having the surgery, has been a tough time. LBP worse when sitting too long, muscles cramps up. No pain with bearing down or valsalva.  Pain gets severe to 10/10, muscle spasms last several hours daily. No bowel or bladder changes.   Review of Systems: Patient complains of symptoms per HPI as well as the following symptoms No CP, No SOB. Pertinent negatives per HPI. All others negative.   History   Social History  . Marital Status: Married    Spouse Name: [name]     Number of Children: 4  . Years of Education: 12+   Occupational History  . Not on file.   Social History Main Topics  . Smoking status: Never Smoker   . Smokeless tobacco: Never Used  . Alcohol Use: Yes     Comment: 3 glasses wine per week  . Drug Use: No  . Sexual Activity: Not on file   Other Topics Concern  . Not on file   Social History Narrative   Social History:      [name] - husband; [phone] -daughter; Micah 2002- son [name] 2003 daughter [name] 2008 daughter           Family History  Problem Relation Age of Onset  . Cancer Mother   . Cancer Maternal Grandfather   . Stroke Paternal Grandfather   . Kidney disease Paternal Grandfather     Past Medical History  Diagnosis Date  . Anemia   . GERD (gastroesophageal reflux disease)   . Problems with swallowing   . Syncope   . Adenomyosis     not papanicolaou smear of  cervix and cervical HPV  . Shortness of breath     with allergies  . Fibromyalgia   . Complication of anesthesia     BP "dropped" during epidural  . PONV (postoperative nausea and vomiting)     Past Surgical History  Procedure Laterality Date  . Appendectomy    . Ovarian cyst removal    . Esophagogastroduodenoscopy (egd) with propofol N/A 02/09/2013    Procedure: ESOPHAGOGASTRODUODENOSCOPY (EGD) WITH PROPOFOL;  Surgeon: Arta Silence, MD;  Location: [facility];  Service: Endoscopy;  Laterality: N/A;  . Colonoscopy with propofol N/A 02/09/2013    Procedure: COLONOSCOPY WITH PROPOFOL;  Surgeon: Arta Silence, MD;  Location: [facility];  Service: Endoscopy;  Laterality: N/A;  need ultra thin colon scope    . Balloon dilation N/A 02/09/2013    Procedure: BALLOON DILATION;  Surgeon: Arta Silence, MD;  Location: [facility];  Service: Endoscopy;  Laterality: N/A;  . Laparoscopic assisted vaginal hysterectomy Right 06/14/2014    Procedure: OPEN LAPAROSCOPIC ASSISTED VAGINAL HYSTERECTOMY;  Surgeon: Allena Katz, MD;  Location: [facility];  Service: Gynecology;  Laterality: Right;  . Bilateral salpingectomy N/A 06/14/2014    Procedure: BILATERAL SALPINGECTOMY;  Surgeon: Allena Katz, MD;  Location: [facility];  Service: Gynecology;  Laterality: N/A;  . Lysis of adhesion N/A 06/14/2014    Procedure: LYSIS OF ADHESION;  Surgeon: Allena Katz, MD;  Location: [facility];  Service: Gynecology;  Laterality: N/A;    Current Outpatient Prescriptions  Medication Sig Dispense Refill  . albuterol (PROVENTIL HFA;VENTOLIN HFA) 108 (90 BASE) MCG/ACT inhaler Inhale 2 puffs into the lungs every 6 (six) hours as needed for wheezing.    Marland Kitchen azithromycin (ZITHROMAX) 250 MG tablet Take 2 on day 1 and then 1 pill per day until you run out. 6 tablet 0  . Cyanocobalamin (B-12 PO) Take 1 tablet by mouth daily.    Marland Kitchen docusate sodium (COLACE) 100 MG capsule Take 200 mg by mouth daily as needed for moderate constipation.     . fexofenadine (ALLEGRA) 180 MG tablet Take 180 mg by mouth daily.    . fluticasone (FLONASE) 50 MCG/ACT nasal spray Place 2 sprays into both nostrils daily. 16 g 6  . OVER THE COUNTER MEDICATION Take 5 mLs by mouth 2 (two) times daily. Patient is taking Floradix supplement.    Marland Kitchen OVER THE COUNTER MEDICATION Take 1 capsule by mouth daily. Patient takes methylated folate.     No current facility-administered medications for this visit.    Allergies as of 10/09/2014 - Review Complete 10/09/2014  Allergen Reaction Noted  . Avocado Shortness Of Breath and Nausea And Vomiting 01/26/2013  . Other Other (See Comments) 05/29/2014  . Peanuts [peanut oil] Other (See Comments) 01/26/2013  . Ciprofloxacin Other  (See Comments) 04/13/2013  . Demerol [meperidine] Other (See Comments) 02/07/2013  . Latex Swelling 05/29/2014  . Macadamia nut oil Hives and Swelling 02/07/2013  . Mango flavor Swelling 05/29/2014  . Sulfamethoxazole Other (See Comments) 09/03/2006  . Iodinated diagnostic agents Hives 10/25/2012    Vitals: BP 103/70 mmHg  Pulse 72  Ht 5' 7.5" (1.715 m)  Wt 142 lb 9.6 oz (64.683 kg)  BMI 21.99 kg/m2  LMP 05/20/2014 Last Weight:  Wt Readings from Last 1 Encounters:  10/09/14 142 lb 9.6 oz (64.683 kg)   Last Height:   Ht Readings from Last 1 Encounters:  10/09/14 5' 7.5" (1.715 m)    Physical exam: Exam: Gen: NAD, conversant, well nourised,  obese, well groomed                     CV: RRR, no MRG. No Carotid Bruits. No peripheral edema, warm, nontender Eyes: Conjunctivae clear without exudates or hemorrhage  Neuro: Detailed Neurologic Exam  Speech:    Speech is normal; fluent and spontaneous with normal comprehension.  Cognition:    The patient is oriented to person, place, and time;     recent and remote memory intact;     language fluent;     normal attention, concentration,     fund of knowledge Cranial Nerves:    The pupils are equal, round, and reactive to light. The fundi are normal and spontaneous venous pulsations are present. Visual fields are full to finger confrontation. Extraocular movements are intact. Trigeminal sensation is intact and the muscles of mastication are normal. The face is symmetric. The palate elevates in the midline. Voice is normal. Shoulder shrug is normal. The tongue has normal motion without fasciculations.   Coordination:    Normal finger to nose and heel to shin. Normal rapid alternating movements.   Gait:    Heel-toe and tandem gait are normal.   Motor Observation:    No asymmetry, no atrophy, and no involuntary movements noted. Tone:    Normal muscle tone.    Posture:    Posture is normal. normal erect    Strength: Bilateral  weakness hip flexion, Left HS and quad.    Otherwise strength is V/V in the upper and lower limbs.      Sensation: intact     Reflex Exam:  DTR's:    Deep tendon reflexes in the upper and lower extremities are normal bilaterally.   Toes:    The toes are downgoing bilaterally.   Clonus:    Clonus is absent.      Assessment/Plan:  43 year old female who is here for severe muscle spasms after lifting a heavy object in the setting of abdominal/pelvic surgery. Most likely mechanical strain. Neuro exam is shows mild weakness in the left > right leg. She can benefit from continued PT for strengthening the legs and abdomen muscles to avoid LBP and other injuries in the future. For neck and shoulder pain, suggest heat, strengthening, massage. Will also give Soma as it tends to be less sedating than Flexeril. Also topical lidocaine patches. Can follow up in 3 months or sooner if necessary.   Sarina Ill, MD  [facility] [address] [place], [place] [address]  Phone [phone] Fax [phone] Lenor Coffin

## 2014-10-11 ENCOUNTER — Other Ambulatory Visit: Payer: 59

## 2014-10-13 ENCOUNTER — Other Ambulatory Visit: Payer: 59

## 2014-10-16 ENCOUNTER — Other Ambulatory Visit: Payer: 59

## 2015-01-03 ENCOUNTER — Telehealth: Payer: Self-pay | Admitting: Family Medicine

## 2015-01-03 ENCOUNTER — Encounter: Payer: Self-pay | Admitting: Family Medicine

## 2015-01-03 ENCOUNTER — Ambulatory Visit (INDEPENDENT_AMBULATORY_CARE_PROVIDER_SITE_OTHER): Payer: PRIVATE HEALTH INSURANCE | Admitting: Family Medicine

## 2015-01-03 VITALS — BP 115/77 | HR 68 | Temp 98.3°F | Wt 144.1 lb

## 2015-01-03 DIAGNOSIS — M791 Myalgia: Secondary | ICD-10-CM

## 2015-01-03 DIAGNOSIS — M609 Myositis, unspecified: Secondary | ICD-10-CM | POA: Diagnosis not present

## 2015-01-03 DIAGNOSIS — IMO0001 Reserved for inherently not codable concepts without codable children: Secondary | ICD-10-CM

## 2015-01-03 DIAGNOSIS — D509 Iron deficiency anemia, unspecified: Secondary | ICD-10-CM

## 2015-01-03 LAB — CBC
HCT: 39.8 % (ref 36.0–46.0)
Hemoglobin: 13.5 g/dL (ref 12.0–15.0)
MCH: 30 pg (ref 26.0–34.0)
MCHC: 33.9 g/dL (ref 30.0–36.0)
MCV: 88.4 fL (ref 78.0–100.0)
MPV: 10.1 fL (ref 8.6–12.4)
Platelets: 255 10*3/uL (ref 150–400)
RBC: 4.5 MIL/uL (ref 3.87–5.11)
RDW: 13.1 % (ref 11.5–15.5)
WBC: 7.4 10*3/uL (ref 4.0–10.5)

## 2015-01-03 LAB — FERRITIN: Ferritin: 42 ng/mL (ref 10–291)

## 2015-01-03 NOTE — Telephone Encounter (Signed)
Emily Joseph need to speak with you regarding elevation of her thyroglobin levels?.  Wanted to know if there is need for concern or not.

## 2015-01-03 NOTE — Progress Notes (Signed)
   Subjective:    Patient ID: Emily Joseph, female    DOB: Mar 21, 1971, 44 y.o.   MRN: [id number]  HPI  Joint Pain Having increased pain in joints of shoulders, elbows, knees.  Was worse last week when under lots of stress with her father's terminal illness.   Better now. Used aleve and tylenol which helped.  She feels her joints were swollen but they did not look enlarged or red.  She may have had a low grade fever.   No rash.    PMH Has seen Rheum in past Dr Estanislado Spire and had extensive lab work up with a slight elevation in RA titer other wise negative including for lyme etc   Anemia -  Iron def with a very low ferritin.  Followed by Dr Gertie Fey.  She thinks anemia is resolved but not sure if ferritin is normal. She is not taking iron now  No history of diarrhea or loose stools or weight loss   Chief Complaint noted Review of Symptoms - see HPI PMH - Smoking status noted.   Vital Signs reviewed    Review of Systems     Objective:   Physical Exam  Alert no acute distress Extremities:  No cyanosis, edema, or deformity noted with good range of motion of all major joints.   Skin:  Intact without suspicious lesions or rashes Occasional tearful other wise Psych:  Cognition and judgment appear intact. Alert, communicative  and cooperative with normal attention span and concentration. No apparent delusions, illusions, hallucinations       Assessment & Plan:

## 2015-01-03 NOTE — Patient Instructions (Signed)
Good to see you today!  Thanks for coming in.  I will be in touch about your ferritin level  Keep going but take some time for you - It is very important for the long haul!

## 2015-01-03 NOTE — Assessment & Plan Note (Signed)
No signs of specific rheumatologic disorder other than fibromyalgia.  Continue as needed analgesics.  May want to refer to another rheumatologist for a second opinion.  Will check ferritin as iron def correction might make her feel better

## 2015-01-04 NOTE — Telephone Encounter (Signed)
Told levels were ok As was ferritin and cbc  She will call back if would like a referral to rheum

## 2015-01-17 ENCOUNTER — Telehealth: Payer: Self-pay | Admitting: Family Medicine

## 2015-01-17 DIAGNOSIS — IMO0001 Reserved for inherently not codable concepts without codable children: Secondary | ICD-10-CM

## 2015-01-17 NOTE — Telephone Encounter (Signed)
Would like a new rheumtology referral Having pain in hands

## 2015-01-17 NOTE — Telephone Encounter (Signed)
Will forward to MD. Ashonte Angelucci,CMA  

## 2015-01-18 NOTE — Telephone Encounter (Signed)
I put in referral  Thanks  [place]

## 2015-01-25 ENCOUNTER — Telehealth: Payer: Self-pay | Admitting: Family Medicine

## 2015-01-25 NOTE — Telephone Encounter (Signed)
Pt called to check the status of her referral to see Dr. Charlestine Night? She has insurance [facility] and is unsure why this is taking so long. jw

## 2015-01-29 NOTE — Telephone Encounter (Signed)
Referral has now been sent to Dr. Charlestine Night. His office will contact patient.  Patient insurance had not been filed when she came in for her appt in Feb.

## 2015-03-19 ENCOUNTER — Encounter: Payer: Self-pay | Admitting: Family Medicine

## 2015-03-19 ENCOUNTER — Ambulatory Visit (INDEPENDENT_AMBULATORY_CARE_PROVIDER_SITE_OTHER): Payer: 59 | Admitting: Family Medicine

## 2015-03-19 VITALS — BP 122/79 | HR 68 | Temp 98.8°F | Wt 145.0 lb

## 2015-03-19 DIAGNOSIS — R300 Dysuria: Secondary | ICD-10-CM

## 2015-03-19 LAB — POCT URINALYSIS DIPSTICK
Bilirubin, UA: NEGATIVE
GLUCOSE UA: NEGATIVE
KETONES UA: NEGATIVE
Nitrite, UA: NEGATIVE
PH UA: 7
PROTEIN UA: NEGATIVE
SPEC GRAV UA: 1.015
Urobilinogen, UA: 0.2

## 2015-03-19 LAB — POCT UA - MICROSCOPIC ONLY

## 2015-03-19 MED ORDER — CEPHALEXIN 500 MG PO CAPS: 500.0000 mg | ORAL_CAPSULE | Freq: Two times a day (BID) | ORAL | Status: DC

## 2015-03-19 NOTE — Progress Notes (Signed)
Patient ID: Emily Joseph, female   DOB: 02/12/1971, 44 y.o.   MRN: [id number]  HPI:  Pt presents for a same day appointment to discuss possible UTI.  Patient reports a history of chronic hematuria and multiple cystoscopies performed in the past dating back from when she was a child. On Saturday evening she began to experience frequency. She's been taking Benadryl to help with her symptoms, states she's been told in the past that this will help. She gets urinary tract infections about once every 1.5 years. The reason she is here today is to obtain a prescription for an antibiotic, she is about to go out of town to be with her dying father. Her father has bile duct cancer. She states she will only take the antibiotic if it is absolutely needed.  She reports a history of right-sided abdominal pain for 4 years. She wants me to send a message to her PCP Dr. Erin Hearing to ask if he would like her to see Dr. Henrene Pastor or just continue to observe this pain. It has increased over the last few weeks. It acutely got worse after reaching down several weeks ago and having a muscle strain. This pain has apparently been extensively worked up in the past.  ROS: See HPI  [place]: History of irritable bowel syndrome, fibromyalgia  PHYSICAL EXAM: BP 122/79 mmHg  Pulse 68  Temp(Src) 98.8 F (37.1 C) (Oral)  Wt 145 lb (65.772 kg)  LMP 05/20/2014 Gen: No acute distress, pleasant, cooperative HEENT: Normocephalic, atraumatic, oropharynx clear and moist Abdomen: Soft, nontender to palpation, no masses or organomegaly, no peritoneal signs, no rebound tenderness Neuro: Grossly nonfocal, speech normal Back: No CVA tenderness bilaterally  ASSESSMENT/PLAN:  1. Urinary frequency: Symptoms consistent with her prior UTIs. Urinalysis with moderate blood and trace leukocytes. She does have trace bacteria on the microscopic urinalysis, indicative of possible mild UTI. Reasonable to give course of Keflex since she is going out of  town to be with her dying father. She's been instructed to follow-up if not improving. Had wanted to send culture, but urine was discarded by the time I was able to put in order. Culture likely low yield in this situation anyways, as she has history of growing Escherichia coli sensitive to cefazolin.  2. Chronic right-sided abdominal pain: Abdominal exam benign today. Will route this note to PCP as an FYI to inform him of mild worsening of chronic right-sided abdominal pain per pt request. Patient's specific question for him is whether she should see Dr. Henrene Pastor. States she does not want to go back to [facility].  FOLLOW UP: F/u as needed if symptoms worsen or do not improve.   [place]. Ardelia Mems, [place]

## 2015-03-19 NOTE — Patient Instructions (Signed)
I sent in prescription for keflex for you Call us if you are not getting better  Be well, Dr. Ardelia Mems

## 2015-04-18 ENCOUNTER — Other Ambulatory Visit: Payer: Self-pay | Admitting: Obstetrics and Gynecology

## 2015-04-18 DIAGNOSIS — N6310 Unspecified lump in the right breast, unspecified quadrant: Secondary | ICD-10-CM

## 2015-05-10 ENCOUNTER — Encounter: Payer: Self-pay | Admitting: Family Medicine

## 2015-05-10 ENCOUNTER — Ambulatory Visit (INDEPENDENT_AMBULATORY_CARE_PROVIDER_SITE_OTHER): Payer: 59 | Admitting: Family Medicine

## 2015-05-10 VITALS — BP 128/58 | HR 71 | Temp 98.1°F | Resp 2 | Ht 67.5 in | Wt 144.0 lb

## 2015-05-10 DIAGNOSIS — G8929 Other chronic pain: Secondary | ICD-10-CM

## 2015-05-10 DIAGNOSIS — R1011 Right upper quadrant pain: Secondary | ICD-10-CM | POA: Diagnosis not present

## 2015-05-10 DIAGNOSIS — R1010 Upper abdominal pain, unspecified: Secondary | ICD-10-CM | POA: Diagnosis not present

## 2015-05-10 NOTE — Assessment & Plan Note (Signed)
History suggestive of cholelithiasis however patient has had imaging in the relative recent past that was negative. This could all be MSK in nature. She has been taking some Flexeril and has noticed some improvement. I encouraged her to continue intermittent use of Flexeril. Additionally, encouraged her to take some Prilosec if there was a component of gastritis/GERD. Obtaining ultrasound to assess for gallstones. Patient to follow-up with Dr. Erin Hearing following ultrasound.

## 2015-05-10 NOTE — Progress Notes (Signed)
   Subjective:    Patient ID: Emily Joseph, female    DOB: 01-28-1971, 44 y.o.   MRN: [id number]  HPI  44 year old female with a past history of myalgias/fibromyalgia, irritable bowel, and chronic right upper quadrant pain presents for same day appointment with complaints of right upper quadrant pain.  Patient reports that she has had right upper quadrant pain for approximately one week. She states that this is different from her prior episodes of right upper quadrant pain. She states it is intermittent and sharp. It is intermittent and severe and typically followed by a dull ache. She reports that she has noticed some pain after eating typically fatty meals. She denies any fevers, chills, vomiting. She has had some nausea. She also endorses chronic constipation. She does note that she drinks alcohol a few times a week. She has taken some NSAIDs postoperatively but has not taken any recently.   Review of Systems Per HPI    Objective:   Physical Exam Filed Vitals:   05/10/15 1637  BP: 128/58  Pulse: 71  Temp: 98.1 F (36.7 C)  Resp: 2  Vital signs reviewed.  Exam: General: well appearing anxious female in no acute distress. HEENT: NCAT. No scleral icterus.  Cardiovascular: RRR. No murmurs, rubs, or gallops. Respiratory: CTAB. No rales, rhonchi, or wheeze. Abdomen: soft, Nondistended. Patient is slightly tender palpation in the right upper quadrant. No rebound or guarding. No palpable organomegaly.     Assessment & Plan:   see problem was.

## 2015-05-10 NOTE — Patient Instructions (Signed)
It was nice to see today. Please be sure to take the Flexeril as well as Prilosec.  We will schedule an ultrasound for repeat evaluation.  Follow-up with Dr. Erin Hearing  following your ultrasound.  Take care  Dr. Lacinda Axon

## 2015-05-11 ENCOUNTER — Other Ambulatory Visit: Payer: Self-pay | Admitting: Family Medicine

## 2015-05-16 ENCOUNTER — Telehealth: Payer: Self-pay | Admitting: Family Medicine

## 2015-05-16 NOTE — Telephone Encounter (Signed)
Pt called because she was seen last week by Dr. Lacinda Axon who said he was going to order a [place] for her. She called today because she has not received an appointment yet. Please let patient know when she can schedule her Korea and also her BP is being very low and would like to know if she can come in for blood work. Please call patient and if she doesn't answer leave a message and she will call back. jw

## 2015-05-16 NOTE — Telephone Encounter (Signed)
Will forward to MD to see if patient needs labs or an appt to be seen for the BP concern. Ahmyah Gidley,CMA

## 2015-05-17 NOTE — Telephone Encounter (Signed)
Left messsage with dtr.  Told to come in for visit if feeling worse.  I will be out of office tomorrow   [place] Pls call her about how to schedule the [place]  Thanks

## 2015-05-17 NOTE — Telephone Encounter (Signed)
Called NA Left message will try to call back

## 2015-05-18 ENCOUNTER — Telehealth: Payer: Self-pay | Admitting: Family Medicine

## 2015-05-18 ENCOUNTER — Other Ambulatory Visit: Payer: PRIVATE HEALTH INSURANCE

## 2015-05-18 NOTE — Telephone Encounter (Signed)
Nothing to eat or drink 6 hrs. Prior to appt.

## 2015-05-18 NOTE — Telephone Encounter (Signed)
LMOVM. Please advise patient her ultrasound has been sch'd for Mon 05/21/15 at 11:30am (arrival 11:115am) at [facility], Radiology 1st Floor. [facility] did not have opening until 7/18).   If this does not work she can call [phone] to reschedule.

## 2015-05-18 NOTE — Telephone Encounter (Signed)
Ultrasound scheduled by tia. Draken Farrior,CMA

## 2015-05-21 ENCOUNTER — Ambulatory Visit (HOSPITAL_COMMUNITY): Payer: 59

## 2015-05-24 ENCOUNTER — Telehealth: Payer: Self-pay | Admitting: Family Medicine

## 2015-05-24 ENCOUNTER — Encounter: Payer: Self-pay | Admitting: Family Medicine

## 2015-05-24 ENCOUNTER — Ambulatory Visit (HOSPITAL_COMMUNITY)
Admission: RE | Admit: 2015-05-24 | Discharge: 2015-05-24 | Disposition: A | Discharge status: Home / Self Care | Payer: 59 | Source: Ambulatory Visit | Attending: Family Medicine | Admitting: Family Medicine

## 2015-05-24 DIAGNOSIS — R110 Nausea: Secondary | ICD-10-CM | POA: Diagnosis not present

## 2015-05-24 DIAGNOSIS — R109 Unspecified abdominal pain: Secondary | ICD-10-CM | POA: Diagnosis not present

## 2015-05-24 DIAGNOSIS — R1011 Right upper quadrant pain: Secondary | ICD-10-CM

## 2015-05-24 NOTE — Telephone Encounter (Signed)
Will forward to MD to advise on ultrasound once results are back. Jazmin Hartsell,CMA

## 2015-05-24 NOTE — Telephone Encounter (Signed)
Pt called because today 7/14 she had an US done,. She is worried about the results and would like the doctor to call her as soon as we get the results. jw

## 2015-05-24 NOTE — Telephone Encounter (Signed)
Pls let her know I sent her a My Chart message and included the [place] results  Thanks  [place]

## 2015-05-25 ENCOUNTER — Ambulatory Visit
Admission: RE | Admit: 2015-05-25 | Discharge: 2015-05-25 | Disposition: A | Discharge status: Home / Self Care | Payer: 59 | Source: Ambulatory Visit | Attending: Obstetrics and Gynecology | Admitting: Obstetrics and Gynecology

## 2015-05-25 DIAGNOSIS — N6310 Unspecified lump in the right breast, unspecified quadrant: Secondary | ICD-10-CM

## 2015-05-25 NOTE — Telephone Encounter (Signed)
LM for patient to call back.  Mychart message from MD:    Dandrea   Your [place] results are below. Everything looks ok. I suspect your pain may be musculoskeletal?  If it is still bothering you a lot let me know.   Dr Charlestine Night has been sending me copies of his evaluations   Emily Joseph

## 2015-05-25 NOTE — Telephone Encounter (Signed)
Pt calling, says she cannot remember her mychart password, would like Dr. Erin Hearing to call her, has an appt from 1-2:30 today...would love a phone call before her 1:00 appt. Please call [phone].

## 2015-05-25 NOTE — Telephone Encounter (Signed)
Called discussed findings  Told to follow up if not improving in 6 weeks or if worsening

## 2015-06-04 ENCOUNTER — Other Ambulatory Visit: Payer: Self-pay | Admitting: Obstetrics and Gynecology

## 2015-06-05 LAB — CYTOLOGY - PAP

## 2015-06-08 ENCOUNTER — Telehealth: Payer: Self-pay | Admitting: Family Medicine

## 2015-06-08 NOTE — Telephone Encounter (Signed)
Agree patient needs to come in for ov before I can decide where best to refer if needed  Pls notify her  Thanks  [place]

## 2015-06-08 NOTE — Telephone Encounter (Signed)
Will forward to MD.  Patient need to be seen in clinic with PCP to determine if and where a referral is needed. Jazmin Hartsell,CMA

## 2015-06-08 NOTE — Telephone Encounter (Addendum)
Need referral to a neurologist,endocrinologist or cardiologist..  Still feeling week and lot of pain.  Having some hypotension issues.  Not been feeling well constantly.  Have concerned about the bottom number being so low.  Have weakness and fatigue more in the afternoon and evening that morning.

## 2015-06-08 NOTE — Telephone Encounter (Signed)
appt made for 06/20/2015. Emily Joseph,CMA

## 2015-06-20 ENCOUNTER — Encounter: Payer: Self-pay | Admitting: Family Medicine

## 2015-06-20 ENCOUNTER — Ambulatory Visit (INDEPENDENT_AMBULATORY_CARE_PROVIDER_SITE_OTHER): Payer: 59 | Admitting: Family Medicine

## 2015-06-20 VITALS — Temp 98.3°F | Ht 67.5 in | Wt 142.3 lb

## 2015-06-20 DIAGNOSIS — M797 Fibromyalgia: Secondary | ICD-10-CM | POA: Diagnosis not present

## 2015-06-20 NOTE — Assessment & Plan Note (Signed)
Severely worsened likely due to many social stressors.  Discussed approaches - See AVS.  No signs of systemic illness given recent specialist evaluations (Rheum and Neurology) and normal blood work.   Will focus on exercise, low dose muscle relaxed and counseling.  Close follow up to monitor for depression

## 2015-06-20 NOTE — Patient Instructions (Signed)
Good to see you today!  Thanks for coming in.  Contact Dr Clovis Pu about referral for counseling - today  Exercise - 3 x per week - Walk every day - Look at kids schedules - Follow up on [facility] - set up by 8/15   Take flexaril as needed - set your phone when to take - take for 1-2 weeks  Come back in 3 weeks  I will call you to follow up

## 2015-06-20 NOTE — Progress Notes (Signed)
   Subjective:    Patient ID: Emily Joseph, female    DOB: [birth year], 44 y.o.   MRN: [id number]  HPI  Muscle Pain Fibromyalgia Having more pain in all muscles and joints.  Severe fatigue especially at end of day.  No rashes or fevers or nausea vomiting or bleeding.  Has not been taking flexaril.  Does have massages.  Not exercising reguarly  Stressors - 3 teenagers and one 36 yo children, husband travels, father has terminal illness with frequent relapses and remissions, Sister has MS.  Frequently mentions trying to "care for everyone"   PHQ9 - 13 Does not feel sad other than for her father's terminal illness, Does not have suicidal ideation so much as sometimes pain is so bad feels like would feel better not being alive.  Chief Complaint noted Review of Symptoms - see HPI PMH - Smoking status noted.   Vital Signs reviewed    Review of Systems     Objective:   Physical Exam  Psych:  Cognition and judgment appear intact. Alert, communicative  and cooperative with normal attention span and concentration. No apparent delusions, illusions, hallucinations.  Cries but recovers quickly Normal muscle movements      Assessment & Plan:

## 2015-06-26 ENCOUNTER — Telehealth: Payer: Self-pay | Admitting: Family Medicine

## 2015-06-26 DIAGNOSIS — J3489 Other specified disorders of nose and nasal sinuses: Secondary | ICD-10-CM

## 2015-06-26 HISTORY — DX: Other specified disorders of nose and nasal sinuses: J34.89

## 2015-06-26 NOTE — Telephone Encounter (Signed)
Spoke w her Has tried to make appointment with counseling Not w yoga yet Will see me in a few weeks or call if worsening

## 2015-08-01 ENCOUNTER — Ambulatory Visit (INDEPENDENT_AMBULATORY_CARE_PROVIDER_SITE_OTHER): Payer: 59 | Admitting: Family Medicine

## 2015-08-01 ENCOUNTER — Encounter: Payer: Self-pay | Admitting: Family Medicine

## 2015-08-01 VITALS — BP 110/62 | HR 81 | Temp 98.4°F | Wt 143.0 lb

## 2015-08-01 DIAGNOSIS — N390 Urinary tract infection, site not specified: Secondary | ICD-10-CM | POA: Diagnosis not present

## 2015-08-01 DIAGNOSIS — R300 Dysuria: Secondary | ICD-10-CM

## 2015-08-01 LAB — POCT URINALYSIS DIPSTICK
BILIRUBIN UA: NEGATIVE
GLUCOSE UA: NEGATIVE
Ketones, UA: NEGATIVE
NITRITE UA: NEGATIVE
Spec Grav, UA: 1.015
Urobilinogen, UA: 0.2
pH, UA: 7

## 2015-08-01 LAB — POCT UA - MICROSCOPIC ONLY

## 2015-08-01 MED ORDER — CEPHALEXIN 500 MG PO CAPS: 500.0000 mg | ORAL_CAPSULE | Freq: Two times a day (BID) | ORAL | Status: DC

## 2015-08-01 NOTE — Patient Instructions (Signed)
Take keflex 500mg  twice a day for 7 days Sending urine for culture Drink plenty of liquids Return if not improving Referring to urology  Be well, Dr. Ardelia Mems   Urinary Tract Infection Urinary tract infections (UTIs) can develop anywhere along your urinary tract. Your urinary tract is your body's drainage system for removing wastes and extra water. Your urinary tract includes two kidneys, two ureters, a bladder, and a urethra. Your kidneys are a pair of bean-shaped organs. Each kidney is about the size of your fist. They are located below your ribs, one on each side of your spine. CAUSES Infections are caused by microbes, which are microscopic organisms, including fungi, viruses, and bacteria. These organisms are so small that they can only be seen through a microscope. Bacteria are the microbes that most commonly cause UTIs. SYMPTOMS  Symptoms of UTIs may vary by age and gender of the patient and by the location of the infection. Symptoms in young women typically include a frequent and intense urge to urinate and a painful, burning feeling in the bladder or urethra during urination. Older women and men are more likely to be tired, shaky, and weak and have muscle aches and abdominal pain. A fever may mean the infection is in your kidneys. Other symptoms of a kidney infection include pain in your back or sides below the ribs, nausea, and vomiting. DIAGNOSIS To diagnose a UTI, your caregiver will ask you about your symptoms. Your caregiver also will ask to provide a urine sample. The urine sample will be tested for bacteria and white blood cells. White blood cells are made by your body to help fight infection. TREATMENT  Typically, UTIs can be treated with medication. Because most UTIs are caused by a bacterial infection, they usually can be treated with the use of antibiotics. The choice of antibiotic and length of treatment depend on your symptoms and the type of bacteria causing your  infection. HOME CARE INSTRUCTIONS  If you were prescribed antibiotics, take them exactly as your caregiver instructs you. Finish the medication even if you feel better after you have only taken some of the medication.  Drink enough water and fluids to keep your urine clear or pale yellow.  Avoid caffeine, tea, and carbonated beverages. They tend to irritate your bladder.  Empty your bladder often. Avoid holding urine for long periods of time.  Empty your bladder before and after sexual intercourse.  After a bowel movement, women should cleanse from front to back. Use each tissue only once. SEEK MEDICAL CARE IF:   You have back pain.  You develop a fever.  Your symptoms do not begin to resolve within 3 days. SEEK IMMEDIATE MEDICAL CARE IF:   You have severe back pain or lower abdominal pain.  You develop chills.  You have nausea or vomiting.  You have continued burning or discomfort with urination. MAKE SURE YOU:   Understand these instructions.  Will watch your condition.  Will get help right away if you are not doing well or get worse. Document Released: 08/06/2005 Document Revised: 04/27/2012 Document Reviewed: 12/05/2011 [facility] Patient Information 2015 [place], [place]. This information is not intended to replace advice given to you by your health care Kirbi Farrugia. Make sure you discuss any questions you have with your health care Marshawn Normoyle.

## 2015-08-01 NOTE — Progress Notes (Signed)
  HPI:  Pt presents for a same day appointment to discuss possible UTI.  Has history of hematuria for long time, has had 3 cystoscopies in the past, starting at age 44. Has had UTI about 2-3 times per year for the last few years.  Last treated for UTI in May.   On Sunday started having some hematuria and cystitis symptoms. Has had frequency, burning, pressure. History of hysterectomy. Took benadyrl, which helps her symptoms some. Going out of town soon to visit her father, who is in hospice care. No fevers but did feel shaky last night. Eating and drinking well.   ROS: See HPI  Gaastra: history of chronic RUQ pain, recurrent UTI  PHYSICAL EXAM: BP 110/62 mmHg  Pulse 81  Temp(Src) 98.4 F (36.9 C) (Oral)  Wt 143 lb (64.864 kg)  LMP 05/20/2014 Gen: NAD, pleasant, cooperative, well appearing HEENT: normocephalic, atraumatic, MMM Abdomen: soft. Mild suprapubic tenderness. No masses or organomegaly Neuro: grossly nonfocal, speech normal  ASSESSMENT/PLAN:  Recurrent UTI Current symptoms and urinalysis suggestive of another UTI. Discussed recommendation that patient be seen by urology for eval given her complex urological history and recurrent UTI's - she is agreeable Culture urine, rx keflex. F/u if not improving.    FOLLOW UP: F/u as needed if symptoms worsen or do not improve.  Referring to urology  Delorse Limber. Ardelia Mems, [place]

## 2015-08-03 ENCOUNTER — Telehealth: Payer: Self-pay | Admitting: Family Medicine

## 2015-08-03 DIAGNOSIS — N390 Urinary tract infection, site not specified: Secondary | ICD-10-CM | POA: Insufficient documentation

## 2015-08-03 LAB — URINE CULTURE: Colony Count: 70000

## 2015-08-03 NOTE — Telephone Encounter (Signed)
I don't have the results of her antibiotic sensitivities back yet. If we need to change her antibiotic, I'll be happy to call her and send the antibiotic to that pharmacy.   Please let her know this. Thanks, Leeanne Rio, MD

## 2015-08-03 NOTE — Telephone Encounter (Signed)
Need to know if antibiotic will take care of the condition for her uti.  If culture come back positive want to know if a new antibiotic will be needed and if so, need it to be sent to [facility] in [place], [place] on [address].  She will be there due to illness of her father.  Would like to be notified that medication is at that pharmacy before she try to pick up.

## 2015-08-03 NOTE — Telephone Encounter (Signed)
Still having frequency and pressure would like MD to give her a call either way once sensitivities come back.

## 2015-08-03 NOTE — Assessment & Plan Note (Signed)
Current symptoms and urinalysis suggestive of another UTI. Discussed recommendation that patient be seen by urology for eval given her complex urological history and recurrent UTI's - she is agreeable Culture urine, rx keflex. F/u if not improving.

## 2015-08-04 NOTE — Telephone Encounter (Signed)
Called patient today (Saturday) - I still do not have sensitivities back. She reports some improvement (definitely improved today over yesterday) but still not all the way back to normal. She just wanted to keep an eye on culture and see if antibiotics change was necessary. The plan we came up with is that if urine is sensitive to keflex, I won't call her. If we need to change antibiotic, I'll call her to let her know.  Leeanne Rio, MD

## 2015-08-06 ENCOUNTER — Ambulatory Visit (INDEPENDENT_AMBULATORY_CARE_PROVIDER_SITE_OTHER): Payer: 59 | Admitting: Family Medicine

## 2015-08-06 VITALS — BP 112/73 | HR 85 | Temp 98.6°F | Wt 142.2 lb

## 2015-08-06 DIAGNOSIS — N390 Urinary tract infection, site not specified: Secondary | ICD-10-CM | POA: Diagnosis not present

## 2015-08-06 DIAGNOSIS — R319 Hematuria, unspecified: Secondary | ICD-10-CM | POA: Diagnosis not present

## 2015-08-06 LAB — POCT URINALYSIS DIPSTICK
Bilirubin, UA: NEGATIVE
Glucose, UA: NEGATIVE
Ketones, UA: NEGATIVE
Leukocytes, UA: NEGATIVE
Nitrite, UA: NEGATIVE
PROTEIN UA: NEGATIVE
SPEC GRAV UA: 1.01
Urobilinogen, UA: 0.2
pH, UA: 7

## 2015-08-06 LAB — BASIC METABOLIC PANEL WITH GFR
BUN: 9 mg/dL (ref 7–25)
CALCIUM: 9.9 mg/dL (ref 8.6–10.2)
CHLORIDE: 102 mmol/L (ref 98–110)
CO2: 26 mmol/L (ref 20–31)
Creat: 0.54 mg/dL (ref 0.50–1.10)
GFR, Est African American: >89 mL/min (ref >=60)
GFR, Est Non African American: >89 mL/min (ref >=60)
Glucose, Bld: 86 mg/dL (ref 65–99)
Potassium: 4.2 mmol/L (ref 3.5–5.3)
Sodium: 138 mmol/L (ref 135–146)

## 2015-08-06 LAB — POCT UA - MICROSCOPIC ONLY: Bacteria, U Microscopic: NEGATIVE

## 2015-08-06 MED ORDER — NITROFURANTOIN MONOHYD MACRO 100 MG PO CAPS: 100.0000 mg | ORAL_CAPSULE | Freq: Two times a day (BID) | ORAL | Status: AC

## 2015-08-06 NOTE — Assessment & Plan Note (Signed)
Patient with chronic hematuria for many years Complete abdominal ultrasound in 05/2015 with normal appearance of bilateral kidneys Repeat bmet today as it is been >1 yr since last Cr Referral to urology pending

## 2015-08-06 NOTE — Patient Instructions (Signed)
Nice to meet you today.  We will switch her anabiotic to Macrobid. Take this twice a day for the next 2 days. We are awaiting a referral to urology. I will get some blood work today to check the function of your kidneys. Someone will call you or send you a letter with these results when they're available.  Take care, Dr. B  Urinary Tract Infection Urinary tract infections (UTIs) can develop anywhere along your urinary tract. Your urinary tract is your body's drainage system for removing wastes and extra water. Your urinary tract includes two kidneys, two ureters, a bladder, and a urethra. Your kidneys are a pair of bean-shaped organs. Each kidney is about the size of your fist. They are located below your ribs, one on each side of your spine. CAUSES Infections are caused by microbes, which are microscopic organisms, including fungi, viruses, and bacteria. These organisms are so small that they can only be seen through a microscope. Bacteria are the microbes that most commonly cause UTIs. SYMPTOMS  Symptoms of UTIs may vary by age and gender of the patient and by the location of the infection. Symptoms in young women typically include a frequent and intense urge to urinate and a painful, burning feeling in the bladder or urethra during urination. Older women and men are more likely to be tired, shaky, and weak and have muscle aches and abdominal pain. A fever may mean the infection is in your kidneys. Other symptoms of a kidney infection include pain in your back or sides below the ribs, nausea, and vomiting. DIAGNOSIS To diagnose a UTI, your caregiver will ask you about your symptoms. Your caregiver also will ask to provide a urine sample. The urine sample will be tested for bacteria and white blood cells. White blood cells are made by your body to help fight infection. TREATMENT  Typically, UTIs can be treated with medication. Because most UTIs are caused by a bacterial infection, they usually can be  treated with the use of antibiotics. The choice of antibiotic and length of treatment depend on your symptoms and the type of bacteria causing your infection. HOME CARE INSTRUCTIONS  If you were prescribed antibiotics, take them exactly as your caregiver instructs you. Finish the medication even if you feel better after you have only taken some of the medication.  Drink enough water and fluids to keep your urine clear or pale yellow.  Avoid caffeine, tea, and carbonated beverages. They tend to irritate your bladder.  Empty your bladder often. Avoid holding urine for long periods of time.  Empty your bladder before and after sexual intercourse.  After a bowel movement, women should cleanse from front to back. Use each tissue only once. SEEK MEDICAL CARE IF:   You have back pain.  You develop a fever.  Your symptoms do not begin to resolve within 3 days. SEEK IMMEDIATE MEDICAL CARE IF:   You have severe back pain or lower abdominal pain.  You develop chills.  You have nausea or vomiting.  You have continued burning or discomfort with urination. MAKE SURE YOU:   Understand these instructions.  Will watch your condition.  Will get help right away if you are not doing well or get worse. Document Released: 08/06/2005 Document Revised: 04/27/2012 Document Reviewed: 12/05/2011 [facility] Patient Information 2015 [place], [place]. This information is not intended to replace advice given to you by your health care provider. Make sure you discuss any questions you have with your health care provider.

## 2015-08-06 NOTE — Progress Notes (Signed)
   Subjective:   Emily Joseph is a 44 y.o. female with a history of frequent UTIs here for same day appt for possible UTI.  Patient seen by Dr. Ardelia Mems 9/21 for hematuria, urinary frequency, burning, suprapubic pressure. UA revealed likely UTI and urine culture sent. Patient started on 7 day course of Keflex at that time and referred to urology for recurrent UTIs and complicated urinary tract history.  Patient returns to clinic today reporting that urinary frequency and malodorous continues, despite Keflex.  Feels very tired and run down.  Having chills x2 days intermittently but no fevers.  Reports Keflex gives her palpitations after taking.  She has had palpitations and tachycardia in the past, but this felt slightly different.  Also reports that Cipro makes her joints hurt and has allergy to Sulfamethoxazole.  Has taken Macrobid and Levaquin in the past and had no issues  Review of Systems: Per HPI.  Social History: non smoker  Objective:  BP 112/73 mmHg  Pulse 85  Temp(Src) 98.6 F (37 C) (Oral)  Wt 142 lb 3.2 oz (64.501 kg)  LMP 05/20/2014  Gen:  44 y.o. female in NAD HEENT: NCAT, MMM CV: RRR, no MRG Resp: Non-labored, CTAB, no wheezes noted Abd: Soft, ND, Mildly TTP in RLQ (patient reports this is chronic), BS present, no guarding or organomegaly Ext: WWP, no edema Neuro: Alert and oriented, speech normal      Chemistry      Component Value Date/Time   NA 140 07/04/2014 1713   K 3.9 07/04/2014 1713   CL 102 07/04/2014 1713   CO2 27 07/04/2014 1713   BUN 11 07/04/2014 1713   CREATININE 0.60 07/04/2014 1713   CREATININE 0.65 11/25/2013 0855      Component Value Date/Time   CALCIUM 9.4 07/04/2014 1713   ALKPHOS 87 07/04/2014 1713   AST 14 07/04/2014 1713   ALT 17 07/04/2014 1713   BILITOT 0.3 07/04/2014 1713      Assessment & Plan:     Emily Joseph is a 44 y.o. female here for UTI f/u.  Recurrent UTI Symptoms improving mostly, but patient reports  palpitations with Keflex Urine culture from 9/21 reveals pansensitive Escherichia coli Will switch to Macrobid for the last 2 days of treatment Urology referral pending  Hematuria Patient with chronic hematuria for many years Complete abdominal ultrasound in 05/2015 with normal appearance of bilateral kidneys Repeat bmet today as it is been >1 yr since last Cr Referral to urology pending    Virginia Crews, MD MPH PGY-2,  [facility] 08/06/2015  12:27 PM

## 2015-08-06 NOTE — Assessment & Plan Note (Signed)
Symptoms improving mostly, but patient reports palpitations with Keflex Urine culture from 9/21 reveals pansensitive Escherichia coli Will switch to Macrobid for the last 2 days of treatment Urology referral pending

## 2015-08-06 NOTE — Addendum Note (Signed)
Addended by: Junious Dresser on: 08/06/2015 01:52 PM   Modules accepted: Orders

## 2015-08-07 ENCOUNTER — Encounter: Payer: Self-pay | Admitting: Family Medicine

## 2015-08-24 ENCOUNTER — Telehealth: Payer: Self-pay | Admitting: Family Medicine

## 2015-08-24 DIAGNOSIS — H5712 Ocular pain, left eye: Secondary | ICD-10-CM

## 2015-08-24 NOTE — Telephone Encounter (Addendum)
Need to have referral for a opthalmologist for pressue and pain to left eye and bright spot upper area.

## 2015-08-24 NOTE — Telephone Encounter (Signed)
Will forward to MD. Jazmin Hartsell,CMA  

## 2015-08-24 NOTE — Telephone Encounter (Signed)
Also want to be seen by [facility]

## 2015-08-27 NOTE — Telephone Encounter (Signed)
I put in the referall  Thanks [place]

## 2015-09-05 ENCOUNTER — Ambulatory Visit (INDEPENDENT_AMBULATORY_CARE_PROVIDER_SITE_OTHER): Payer: 59 | Admitting: Family Medicine

## 2015-09-05 ENCOUNTER — Encounter: Payer: Self-pay | Admitting: Family Medicine

## 2015-09-05 VITALS — BP 122/71 | HR 78 | Temp 98.9°F | Ht 68.0 in | Wt 142.0 lb

## 2015-09-05 DIAGNOSIS — M797 Fibromyalgia: Secondary | ICD-10-CM | POA: Diagnosis not present

## 2015-09-05 DIAGNOSIS — J3489 Other specified disorders of nose and nasal sinuses: Secondary | ICD-10-CM

## 2015-09-05 NOTE — Patient Instructions (Signed)
Good to see you today!  Thanks for coming in.  Walk at least 3 x a week.  Best is every day Start slow   Reconnect with friends  Change counselor - Call Robbie Lis - today or tomrrow Make an appointment to be seen soon  Massage and chiropracter - at least every 3 weeks  Take flexaril as needed  Call if you are feeling a lot worse   Constipation Softner - 3 a day titrate as needed Senokot - 2 a day titrate as needed If no result the occasional MOM    Eye Cal the eye doctor today for an appointment  Come back in 1 month

## 2015-09-05 NOTE — Progress Notes (Signed)
   Subjective:    Patient ID: Emily Joseph, female    DOB: [birth year], 44 y.o.   MRN: [id number]  HPI  Constipation For last 2 weeks.  No bm for 3-4 days then used various softners and stimulants and used enema yesterday with results. No blood some crampy pain.  No vomiting  Nasal Obstruction Using flonase sometimes but gives her a headache.  Using clariten which helps some but can't take allegra as makes her lightheaded.  No bleeding or shortness of breath  Fibromyalgia/depression Has been worse since continued family stress.  Achy all over with troulbe sleeping.  Saw a counselor once a month ago.  Wants to get another.  Has been walking for exercise but none in last week.  No rash or fevers  Chief Complaint noted Review of Symptoms - see HPI PMH - Smoking status noted.   Vital Signs reviewed    Review of Systems     Objective:   Physical Exam  Alert nad Psych:  Cognition and judgment appear intact. Alert, communicative  and cooperative with normal attention span and concentration. No apparent delusions, illusions, hallucinations  PHQ 9 see flow sheet Abdomen: soft and without masses, organomegaly or hernias noted.  No guarding or rebound Mild diffuse tenderness Eye - Pupils Equal Round Reactive to light, Extraocular movements intact, Fundi without hemorrhage or visible lesions, Conjunctiva without redness or discharge       Assessment & Plan:   Constipation - likely related to fluid intake no signs of cancer or obstuction.  See after visit summary for treatment

## 2015-09-05 NOTE — Assessment & Plan Note (Signed)
Worsened.  Discussed regular exercise and importance of seeing a counselor.  Use flexaril as needed

## 2015-09-05 NOTE — Assessment & Plan Note (Signed)
Worsen.  Encouraged to use flonase as tolerated

## 2015-09-18 ENCOUNTER — Ambulatory Visit (INDEPENDENT_AMBULATORY_CARE_PROVIDER_SITE_OTHER): Payer: 59 | Admitting: Family Medicine

## 2015-09-18 VITALS — BP 118/68 | HR 80 | Temp 98.4°F | Wt 142.8 lb

## 2015-09-18 DIAGNOSIS — R1031 Right lower quadrant pain: Secondary | ICD-10-CM

## 2015-09-18 NOTE — Progress Notes (Signed)
   Subjective:    Patient ID: Emily Joseph, female    DOB: [birth year], 44 y.o.   MRN: [id number]  Seen for Same day visit for   CC: abdominal pain   RLQ pain that has been intermittent for the past 25 days. Has had some pain-free days. Father passed away 03/10/23; he had been on palliative care for the past few weeks. Has been treating herself for constipation and feels like "pressure has moved from right-side to left-side".   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    Objective:  BP 118/68 mmHg  Pulse 80  Temp(Src) 98.4 F (36.9 C) (Oral)  Wt 142 lb 12.8 oz (64.774 kg)  SpO2 99%  LMP 05/20/2014  General: NAD Cardiac: RRR, normal heart sounds, no murmurs. 2+ radial and PT pulses bilaterally Respiratory: CTAB, normal effort Abdomen: soft, Mild diffuse tenderness, nondistended, no hepatic or splenomegaly. Bowel sounds present    Assessment & Plan:   Abdominal pain RUQ pain of unsure etiology. Suspect IBS with a psychosomatic component related to her fibromyalgia exacerbated by the death of her father 4 days ago. VSS, benign abdominal exam, and pain free days is reassuring. Possible that she has pain related to constipation based on her history of hard stool or pain due to scar tissue related to her previous abdominal surgeries.  - Long discussion and reassurance provided - Recommend keep food / pain diary  -  Increase Miralax to 2-4 times a day as needed until consistent soft stools - Encouraged good PO water and fiber intake and exercise - follow up with PCP in 2-3 weeks for reassessment       Greater than 30 minutes spent with patient with 50% of time spent on counseling.

## 2015-09-18 NOTE — Assessment & Plan Note (Signed)
RUQ pain of unsure etiology. Suspect IBS with a psychosomatic component related to her fibromyalgia exacerbated by the death of her father 4 days ago. VSS, benign abdominal exam, and pain free days is reassuring. Possible that she has pain related to constipation based on her history of hard stool or pain due to scar tissue related to her previous abdominal surgeries.  - Long discussion and reassurance provided - Recommend keep food / pain diary  - Increase Miralax to 2-4 times a day as needed until consistent soft stools - Encouraged good PO water and fiber intake and exercise - follow up with PCP in 2-3 weeks for reassessment

## 2015-10-03 ENCOUNTER — Telehealth: Payer: Self-pay | Admitting: Family Medicine

## 2015-10-03 ENCOUNTER — Ambulatory Visit (INDEPENDENT_AMBULATORY_CARE_PROVIDER_SITE_OTHER): Payer: 59 | Admitting: Family Medicine

## 2015-10-03 VITALS — BP 112/70 | HR 73 | Temp 97.8°F | Wt 141.8 lb

## 2015-10-03 DIAGNOSIS — R1031 Right lower quadrant pain: Secondary | ICD-10-CM

## 2015-10-03 NOTE — Patient Instructions (Signed)
Thank you so much for coming to visit today! I believe your abdominal pain is musculoskeletal. You may use over the counter medications, creams, or patches to help with your pain. Please follow up with PT and your primary doctor. It would also be a good idea to keep a diary of when your pain occurs and the foods you eat.   Thanks again! Dr. Gerlean Ren

## 2015-10-05 NOTE — Assessment & Plan Note (Signed)
-   Suspect acute flare secondary to musculoskeletal strain given superficial point tenderness and positive Carnett's Sign.  - Refused lidocaine injection over area - Recommend OTC treatment and heat.  - Chronic abdominal pain also present. Recommend keeping diary documenting diet and pain.  - Follow up as scheduled with PCP

## 2015-10-05 NOTE — Progress Notes (Signed)
Subjective:     Patient ID: Emily Joseph, female   DOB: 1971/09/30, 44 y.o.   MRN: [id number]  HPI Mrs. Emily Joseph is a 44yo female presenting today for new onset abdominal pain. - Chart Review shows:  Recent office visit on 09/18/15 for abdominal pain. Located in right lower quadrant, intermittent for 25 days. Pain suspected to be secondary to IBS related to fibromyalgia and worsened by recent death of father. Recommended keeping diary of food and pain and increasing Miralax. - Has not kept record of pain in relationship to food as requested - States current pain is different from previous and now located in right upper quadrant. - Pain started abruptly last night - Reports chills, temperature of 97.1 at home, and nausea - Nausea improved with gingerale. Abdominal pain improved with heat and Aleve - Describes pain as a pulling sensation - Pain seems to be improving - Reports history of hysterectomy last August, ovaries still present, uterus and cervix removed. Appendix removed in 1990. Gallbladder still present. - Denies changes in appetite - States she ate fried food multiple times yesterday, which may have contributed to pain - Reports normal bowel movements, three times daily, for two weeks. History of prior constipation.  - States she does remember twisting when she was getting out of the car and feeling a pull in her abdomen on the day the pain occurred.   Review of Systems Per HPI    Objective:   Physical Exam  Constitutional: She appears well-developed and well-nourished. No distress.  Cardiovascular: Normal rate.  Exam reveals no gallop and no friction rub.   No murmur heard. Pulmonary/Chest: Effort normal. No respiratory distress. She has no wheezes.  Abdominal: Soft. Bowel sounds are normal. She exhibits no distension.  Localized superficial tenderness in right upper quadrant. Negative Murphy's. Negative CVA tenderness. Negative Rovsing's. Negative McBurneys. Negative Rebound.  Mild diffuse abdominal pain consistent with baseline per patient. Positive Carnett's Sign.   Psychiatric: She has a normal mood and affect. Her behavior is normal.       Assessment and Plan:     Abdominal pain - Suspect acute flare secondary to musculoskeletal strain given superficial point tenderness and positive Carnett's Sign.  - Refused lidocaine injection over area - Recommend OTC treatment and heat.  - Chronic abdominal pain also present. Recommend keeping diary documenting diet and pain.  - Follow up as scheduled with PCP

## 2015-10-09 ENCOUNTER — Encounter: Payer: Self-pay | Admitting: Family Medicine

## 2015-10-09 ENCOUNTER — Ambulatory Visit (INDEPENDENT_AMBULATORY_CARE_PROVIDER_SITE_OTHER): Payer: 59 | Admitting: Family Medicine

## 2015-10-09 VITALS — BP 119/74 | HR 90 | Temp 98.2°F | Ht 67.5 in | Wt 139.4 lb

## 2015-10-09 DIAGNOSIS — R195 Other fecal abnormalities: Secondary | ICD-10-CM

## 2015-10-09 DIAGNOSIS — J069 Acute upper respiratory infection, unspecified: Secondary | ICD-10-CM | POA: Diagnosis not present

## 2015-10-09 DIAGNOSIS — B9789 Other viral agents as the cause of diseases classified elsewhere: Principal | ICD-10-CM

## 2015-10-09 NOTE — Patient Instructions (Signed)
Per our discussion, this is most likely a viral illness that will run it's course over the next several days.  If you do not feel better by Thursday, December 1, please call back to the office and let Dr. Mingo Amber know.  You may continue taking Tylenol for fever and use your albuterol inhaler, 2 puffs before dinner and before bedtime for the next 3 days.  This should relieve your cough and let you sleep through the night.  For now, take your Miralax only if you have gone 3 days without a bowel movement.  Increase your water intake with use of Miralax.

## 2015-10-11 NOTE — Addendum Note (Signed)
Addended byMingo Amber, Emmerie Battaglia H on: 10/11/2015 11:46 AM   Modules accepted: Miquel Dunn

## 2015-10-11 NOTE — Progress Notes (Addendum)
Subjective:    Emily Joseph is a 44 y.o. female who presents to [facility] today for 2 issues:  1. Cough: Present for about 1 week. Daughter had viral URI which resolved in 3-4 days. She presented to care today to make sure that wasn't "something worse." Has subjective fevers without recorded temperature at home. Took Tylenol for resolution of fevers earlier today. Describes cough and runny nose. Cough awakens her at night. Dry hacking. She is otherwise eating well and being active  2.  Diarrhea:  With history of IBS-constipation for past several years. Recently she has started having 2 bowel movements a day. She backed off of her MiraLAX but took this 2 days ago. She has had 2 bowel movements so far today which she describes as loose. Now with a 2 bowel movements a day. Wants to ensure this is nothing bad. Has some chronic right upper quadrant pain and no other pain. Does have sensation bloating. No nausea vomiting.  The following portions of the patient's history were reviewed and updated as appropriate: allergies, current medications, past medical history, family and social history, and problem list. Patient is a nonsmoker.    PMH reviewed.  Past Medical History  Diagnosis Date  . Anemia   . GERD (gastroesophageal reflux disease)   . Problems with swallowing   . Syncope   . Adenomyosis     not papanicolaou smear of cervix and cervical HPV  . Shortness of breath     with allergies  . Fibromyalgia   . Complication of anesthesia     BP "dropped" during epidural  . PONV (postoperative nausea and vomiting)    Past Surgical History  Procedure Laterality Date  . Appendectomy    . Ovarian cyst removal    . Esophagogastroduodenoscopy (egd) with propofol N/A 02/09/2013    Procedure: ESOPHAGOGASTRODUODENOSCOPY (EGD) WITH PROPOFOL;  Surgeon: Arta Silence, MD;  Location: [facility];  Service: Endoscopy;  Laterality: N/A;  . Colonoscopy with propofol N/A 02/09/2013    Procedure: COLONOSCOPY WITH  PROPOFOL;  Surgeon: Arta Silence, MD;  Location: [facility];  Service: Endoscopy;  Laterality: N/A;  need ultra thin colon scope  . Balloon dilation N/A 02/09/2013    Procedure: BALLOON DILATION;  Surgeon: Arta Silence, MD;  Location: [facility];  Service: Endoscopy;  Laterality: N/A;  . Laparoscopic assisted vaginal hysterectomy Right 06/14/2014    Procedure: OPEN LAPAROSCOPIC ASSISTED VAGINAL HYSTERECTOMY;  Surgeon: Allena Katz, MD;  Location: [facility];  Service: Gynecology;  Laterality: Right;  . Bilateral salpingectomy N/A 06/14/2014    Procedure: BILATERAL SALPINGECTOMY;  Surgeon: Allena Katz, MD;  Location: [facility];  Service: Gynecology;  Laterality: N/A;  . Lysis of adhesion N/A 06/14/2014    Procedure: LYSIS OF ADHESION;  Surgeon: Allena Katz, MD;  Location: [facility];  Service: Gynecology;  Laterality: N/A;    Medications reviewed. Current Outpatient Prescriptions  Medication Sig Dispense Refill  . albuterol (PROVENTIL HFA;VENTOLIN HFA) 108 (90 BASE) MCG/ACT inhaler Inhale 2 puffs into the lungs every 6 (six) hours as needed for wheezing.    . Cyanocobalamin (B-12 PO) Take 1 tablet by mouth daily.    Marland Kitchen docusate sodium (COLACE) 100 MG capsule Take 200 mg by mouth daily as needed for moderate constipation.     . fexofenadine (ALLEGRA) 180 MG tablet Take 180 mg by mouth daily.    . fluticasone (FLONASE) 50 MCG/ACT nasal spray Place 2 sprays into both nostrils daily. 16 g 6  .  OVER THE COUNTER MEDICATION Take 5 mLs by mouth 2 (two) times daily. Patient is taking Floradix supplement.    Marland Kitchen OVER THE COUNTER MEDICATION Take 1 capsule by mouth daily. Patient takes methylated folate.     No current facility-administered medications for this visit.     Objective:   Physical Exam BP 119/74 mmHg  Pulse 90  Temp(Src) 98.2 F (36.8 C) (Oral)  Ht 5' 7.5" (1.715 m)  Wt 139 lb 6.4 oz (63.231 kg)  BMI 21.50 kg/m2  LMP 05/20/2014 Gen:  Patient sitting on exam table, appears  stated age in no acute distress Head: Normocephalic atraumatic Eyes: EOMI, PERRL, sclera and conjunctiva non-erythematous Ears:  Canals clear bilaterally.  TMs pearly gray bilaterally without erythema or bulging.   Nose:  Nasal turbinates grossly enlarged bilaterally with some exudates noted. No maxillary tenderness. Mouth: Mucosa membranes moist. Tonsils +2, nonenlarged, non-erythematous. Neck: No cervical lymphadenopathy noted Heart:  RRR, no murmurs auscultated. Pulm:  Clear to auscultation bilaterally with good air movement.  No wheezes or rales noted.      No results found for this or any previous visit (from the past 72 hour(s)).  Impression/plan: 1. URI w/ cough: Likely viral illness based on symptoms and history.  No signs of bacterial illness. Symptomatic treatment for now, see instructions. Return if worsening or no improvement in 1 week.  -Recommended to use albuterol inhaler which she has at home at night to help with sleep as her coughing is mostly at night.  #2. Loose stools: - new issue -She seems that over used her MiraLAX. -Recommended to back off. -She is not have more than 2 a day. She may be converting to IBS-diarrhea. -If starts having abdominal pain return to clinic. -No red flags

## 2015-10-24 ENCOUNTER — Ambulatory Visit: Payer: 59 | Admitting: Family Medicine

## 2015-12-28 ENCOUNTER — Encounter: Payer: Self-pay | Admitting: Internal Medicine

## 2016-03-04 ENCOUNTER — Ambulatory Visit (INDEPENDENT_AMBULATORY_CARE_PROVIDER_SITE_OTHER): Payer: 59 | Admitting: Internal Medicine

## 2016-03-04 ENCOUNTER — Other Ambulatory Visit (INDEPENDENT_AMBULATORY_CARE_PROVIDER_SITE_OTHER): Payer: 59

## 2016-03-04 ENCOUNTER — Encounter: Payer: Self-pay | Admitting: Internal Medicine

## 2016-03-04 VITALS — BP 102/68 | HR 80 | Ht 67.5 in | Wt 144.0 lb

## 2016-03-04 DIAGNOSIS — R5383 Other fatigue: Secondary | ICD-10-CM

## 2016-03-04 DIAGNOSIS — Z9109 Other allergy status, other than to drugs and biological substances: Secondary | ICD-10-CM | POA: Insufficient documentation

## 2016-03-04 DIAGNOSIS — G8929 Other chronic pain: Secondary | ICD-10-CM | POA: Diagnosis not present

## 2016-03-04 DIAGNOSIS — Z91048 Other nonmedicinal substance allergy status: Secondary | ICD-10-CM

## 2016-03-04 DIAGNOSIS — R1010 Upper abdominal pain, unspecified: Secondary | ICD-10-CM | POA: Diagnosis not present

## 2016-03-04 DIAGNOSIS — R1011 Right upper quadrant pain: Principal | ICD-10-CM

## 2016-03-04 DIAGNOSIS — K200 Eosinophilic esophagitis: Secondary | ICD-10-CM | POA: Diagnosis not present

## 2016-03-04 DIAGNOSIS — K589 Irritable bowel syndrome without diarrhea: Secondary | ICD-10-CM | POA: Diagnosis not present

## 2016-03-04 DIAGNOSIS — I951 Orthostatic hypotension: Secondary | ICD-10-CM

## 2016-03-04 DIAGNOSIS — R5381 Other malaise: Secondary | ICD-10-CM

## 2016-03-04 DIAGNOSIS — Z91018 Allergy to other foods: Secondary | ICD-10-CM | POA: Insufficient documentation

## 2016-03-04 LAB — COMPREHENSIVE METABOLIC PANEL
ALT: 13 U/L (ref 0–35)
AST: 12 U/L (ref 0–37)
Albumin: 4.4 g/dL (ref 3.5–5.2)
Alkaline Phosphatase: 59 U/L (ref 39–117)
BILIRUBIN TOTAL: 0.8 mg/dL (ref 0.2–1.2)
BUN: 13 mg/dL (ref 6–23)
CO2: 29 meq/L (ref 19–32)
Calcium: 9.5 mg/dL (ref 8.4–10.5)
Chloride: 101 mEq/L (ref 96–112)
Creatinine, Ser: 0.66 mg/dL (ref 0.40–1.20)
GFR: 103.03 mL/min (ref >60.00)
GLUCOSE: 100 mg/dL — AB (ref 70–99)
Potassium: 3.9 mEq/L (ref 3.5–5.1)
Sodium: 137 mEq/L (ref 135–145)
Total Protein: 7.3 g/dL (ref 6.0–8.3)

## 2016-03-04 LAB — TSH: TSH: 0.82 u[IU]/mL (ref 0.35–4.50)

## 2016-03-04 LAB — CBC WITH DIFFERENTIAL/PLATELET
BASOS PCT: 0.6 % (ref 0.0–3.0)
Basophils Absolute: 0.1 10*3/uL (ref 0.0–0.1)
EOS PCT: 1.6 % (ref 0.0–5.0)
Eosinophils Absolute: 0.2 10*3/uL (ref 0.0–0.7)
HEMATOCRIT: 40.6 % (ref 36.0–46.0)
HEMOGLOBIN: 13.9 g/dL (ref 12.0–15.0)
LYMPHS PCT: 15.6 % (ref 12.0–46.0)
Lymphs Abs: 1.5 10*3/uL (ref 0.7–4.0)
MCHC: 34.1 g/dL (ref 30.0–36.0)
MCV: 88.2 fl (ref 78.0–100.0)
Monocytes Absolute: 0.4 10*3/uL (ref 0.1–1.0)
Monocytes Relative: 3.8 % (ref 3.0–12.0)
Neutro Abs: 7.5 10*3/uL (ref 1.4–7.7)
Neutrophils Relative %: 78.4 % — ABNORMAL HIGH (ref 43.0–77.0)
Platelets: 259 10*3/uL (ref 150.0–400.0)
RBC: 4.6 Mil/uL (ref 3.87–5.11)
RDW: 12.9 % (ref 11.5–15.5)
WBC: 9.5 10*3/uL (ref 4.0–10.5)

## 2016-03-04 LAB — FERRITIN: FERRITIN: 40.5 ng/mL (ref 10.0–291.0)

## 2016-03-04 LAB — IGA: IgA: 200 mg/dL (ref 68–378)

## 2016-03-04 MED ORDER — PANTOPRAZOLE SODIUM 40 MG PO TBEC: 40.0000 mg | DELAYED_RELEASE_TABLET | Freq: Every day | ORAL | Status: DC

## 2016-03-04 NOTE — Progress Notes (Signed)
Referred by: Lind Covert, *  Subjective:    Patient ID: Emily Joseph, female    DOB: 01-16-1971, 45 y.o.   MRN: [id number] Cc:  Multiple but she has dysphagia chronic right upper quadrant pain and recent change in bowel habits HPI Patient is a married middle-aged white woman here for evaluation. She's had chronic GI problems and carries a diagnosis of IBS and fibromyalgia. She's been having right upper quadrant pain for a number of years, she had an EGD and a colonoscopy in 2014 by Dr. Paulita Fujita as part of an iron deficiency anemia workup. The colonoscopy was negative but she had findings suggestive of eosinophilic esophagitis and biopsies that showed 17 eosinophils per high-power field. She was treated with ingested fluticasone which was hard to take and didn't seem to help. At some point she's been on a PPI but not recently. He does have intermittent dysphagia. Not frequent but has solid food dysphagia.  She's had a sharp right-sided abdominal pain that is hard to characterize from what she's telling me, I think it would radiate into the back and down into her right lower quadrant at times. She ended up going to physical therapy through urology and they're doing deep tissue massage is as well as internal gynecologic type massages this seemed to be helping her quite a bit and she is pleased. There is a residual pain in the right upper quadrant however. 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 Multiple ultrasounds 2 CT scanning of been unrevealing. She mentions several times that her father died of bile duct cancer though she says she is not concerned that she may have that. She does have a small stable cyst in the liver on the ultrasounds and CT.  She struggles with constipation and says she passes out she takes bisacodyl. She uses stool softeners frequently and uses intermittent MiraLAX and that seems to be helping lately.  Today she feels very weak, says her blood pressure is low though it is  normal, she says her systolic is normally around 120. She has seen Dr. Charlestine Night of Rheumatology for her fibromyalgia. She sees Dr. McDiarmid although she did see Dr. Risa Grill once, for urology issues. She tells me he frequently feels lightheaded and is easy to pass out.  He wonders if she could have celiac disease, she did have duodenal biopsies that were negative at her endoscopy. I don't know if she had celiac serologies or not. She does not believe she did.  He has numerous food allergies and environmental allergies. These are listed. She says this time of year when the pollen is out she tends to have more troubles, Nasacort is helping her breathing better, she has she has small nasal passages that could be corrected with surgery but apparently it would be difficult C she hasn't done that. She thinks that she doesn't sleep well at night or at least doesn't breathe properly at night and that might lead to some of her fatigue issues.  Allergies  Allergen Reactions  . Avocado Shortness Of Breath and Nausea And Vomiting  . Other Other (See Comments)    Patient is allergic to garden peas. Tested positive in allergy testing.  . Peanuts [Peanut Oil] Other (See Comments)    Tested positive on allergy test.  . Ciprofloxacin Other (See Comments)    Severe joint pain  . Demerol [Meperidine] Other (See Comments)    hallucinate   . Latex Swelling  . Macadamia Nut Oil Hives and Swelling  lips swell  . Mango Flavor Swelling    Mango causes swelling of the lips.  . Sulfamethoxazole Other (See Comments)    Upset stomach  . Iodinated Diagnostic Agents Hives    Patient had IVP @ age 39 and broke out in Hives   Outpatient Prescriptions Prior to Visit  Medication Sig Dispense Refill  . albuterol (PROVENTIL HFA;VENTOLIN HFA) 108 (90 BASE) MCG/ACT inhaler Inhale 2 puffs into the lungs every 6 (six) hours as needed for wheezing.    . Cyanocobalamin (B-12 PO) Take 1 tablet by mouth daily.    Marland Kitchen docusate  sodium (COLACE) 100 MG capsule Take 200 mg by mouth daily as needed for moderate constipation.     . fexofenadine (ALLEGRA) 180 MG tablet Take 180 mg by mouth daily.    . fluticasone (FLONASE) 50 MCG/ACT nasal spray Place 2 sprays into both nostrils daily. 16 g 6  . OVER THE COUNTER MEDICATION Take 1 capsule by mouth daily. Patient takes methylated folate.    Marland Kitchen OVER THE COUNTER MEDICATION Take 5 mLs by mouth 2 (two) times daily. Patient is taking Floradix supplement.     No facility-administered medications prior to visit.   Past Medical History  Diagnosis Date  . Anemia   . GERD (gastroesophageal reflux disease)   . Problems with swallowing   . Syncope   . Adenomyosis     not papanicolaou smear of cervix and cervical HPV  . Shortness of breath     with allergies  . Fibromyalgia   . Complication of anesthesia     BP "dropped" during epidural  . PONV (postoperative nausea and vomiting)    Past Surgical History  Procedure Laterality Date  . Appendectomy    . Ovarian cyst removal    . Esophagogastroduodenoscopy (egd) with propofol N/A 02/09/2013    Procedure: ESOPHAGOGASTRODUODENOSCOPY (EGD) WITH PROPOFOL;  Surgeon: Arta Silence, MD;  [facility];  Service: Endoscopy;  Laterality: N/A;  . Colonoscopy with propofol N/A 02/09/2013    Procedure: COLONOSCOPY WITH PROPOFOL;  Surgeon: Arta Silence, MD;  [facility];  Service: Endoscopy;  Laterality: N/A;  need ultra thin colon scope  . Balloon dilation N/A 02/09/2013    Procedure: BALLOON DILATION;  Surgeon: Arta Silence, MD;  [facility];  Service: Endoscopy;  Laterality: N/A;  . Laparoscopic assisted vaginal hysterectomy Right 06/14/2014    Procedure: OPEN LAPAROSCOPIC ASSISTED VAGINAL HYSTERECTOMY;  Surgeon: Allena Katz, MD;  Location: [facility];  Service: Gynecology;  Laterality: Right;  . Bilateral salpingectomy N/A 06/14/2014    Procedure: BILATERAL SALPINGECTOMY;  Surgeon: Allena Katz, MD;   Location: [facility];  Service: Gynecology;  Laterality: N/A;  . Lysis of adhesion N/A 06/14/2014    Procedure: LYSIS OF ADHESION;  Surgeon: Allena Katz, MD;  Location: [facility];  Service: Gynecology;  Laterality: N/A;   Social History   Social History  . Marital Status: Married    Spouse Name: [name]   . Number of Children: 4  . Years of Education: 12+   Social History Main Topics  . Smoking status: Never Smoker   . Smokeless tobacco: Never Used  . Alcohol Use: 0.0 oz/week    0 Standard drinks or equivalent per week     Comment: 3 glasses wine per week  . Drug Use: No  . Sexual Activity: Not Asked   Other Topics Concern  . None   Social History Narrative   Social History:  [name] - husband; [phone] -daughter; [name]- son [name] 2003 daughter [name] 2008 daughter          Family History  Problem Relation Age of Onset  . Cancer Mother   . Cancer Maternal Grandfather   . Stroke Paternal Grandfather   . Kidney disease Paternal Grandfather    Review of Systems As per history of present illness. A lot of joint pain. Some mild depressed mood and some occasional anxiety. Allergy issues as above. All other review of systems negative.    Objective:   Physical Exam @BP  102/68 mmHg  Pulse 80  Ht 5' 7.5" (1.715 m)  Wt 144 lb (65.318 kg)  BMI 22.21 kg/m2  LMP 05/20/2014@  General:  Well-developed, well-nourished and in no acute distress Eyes:  anicteric. ENT:   Mouth and posterior pharynx free of lesions.  Neck:   supple w/o thyromegaly or mass.  Lungs: Clear to auscultation bilaterally. Heart:  S1S2, no rubs, murmurs, gallops. Abdomen:  soft, non-tender, no hepatosplenomegaly, hernia, or mass and BS+.  Lymph:  no cervical or supraclavicular adenopathy. Extremities:   no edema, cyanosis or clubbing Skin   no rash. Neuro:  A&O x 3.  Psych:  appropriate mood and  Affect.   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 [facility] note from November 2016 shows that she has chronic intermittent microhematuria cystocele and stress urinary incontinence. Rheumatology note from April 2016 with fibromyalgia plans for trial of Flexeril low-dose [facility] notes from Dr. Paulita Fujita in 2014 reviewed. Images of [place] and CT reviewed    Assessment & Plan:   Encounter Diagnoses  Name Primary?  . Chronic RUQ pain Yes  . Malaise and fatigue   . Eosinophilic esophagitis   . IBS (irritable bowel syndrome)   . Multiple food allergies   . Multiple environmental allergies   . Orthostasis    Complicated constellation of nurmerous sxs w/o clear unifying dx. Suspect functional body disturbances likely (IBS and Fibromyalgia). Allergic conditions also playing some role.   Start PPI To treat her eosinophilic esophagitis many times the PPI will take care of that. CBC, CMET, TSH, TTG /IgA for lab investigation also check a ferritin level. Ferritin can be low and CBC normal otherwise and still have fatigue. Unlikely that she has celiac disease but it could be missed on duodenal biopsies so we'll check the antibodies. RTC 2 mos for reassessment ? If she could have Dysautonomia and benefit from cardiology evaluation. Some of these people have a lot of GI symptoms like irritable bowel and even perhaps fibromyalgia. I also wonder if she might not have interstitial cystitis.  She clearly seems to have significant health related anxiety as well. I have seen duloxetine help people with a lot of pain problems including fibromyalgia and irritable bowel syndrome and we'll keep that in mind.  CC: Lind Covert, MD Georgiana Spinner M.D. Gayla Doss M.D. Kristine Garbe M.D. Michel Bickers.D.

## 2016-03-04 NOTE — Patient Instructions (Signed)
  Your physician has requested that you go to the basement for the lab work before leaving today.   We have sent the following medications to your pharmacy for you to pick up at your convenience: Pantoprazole   Follow up with Dr Carlean Purl in 2 months.    I appreciate the opportunity to care for you.

## 2016-03-05 LAB — TISSUE TRANSGLUTAMINASE, IGA: Tissue Transglutaminase Ab, IgA: 1 U/mL (ref <4)

## 2016-03-06 ENCOUNTER — Telehealth: Payer: Self-pay | Admitting: Family Medicine

## 2016-03-06 NOTE — Telephone Encounter (Signed)
Patient is needing a nurse to call her back regarding some inflammation that she is having.  She wants to know the last time she had blood work done concerning this.

## 2016-03-06 NOTE — Telephone Encounter (Addendum)
Returned patient call Patient states she had 3.5 days of increased pain, especially in both elbows (rated 7/10) as well as both knees (5/10), hands (5/10) and feet (5/10) States this occurred during the recent rainy days Patient states she receives no pain relief from tylenol/advil when pain is at 7/10 Rates current "achy" pain in elbows, knees, hands and feet at 4/10 which she reports does respond to tylenol and advil States this pain increase occurred at this same time ast year at this time Patient states she has received counseling, has decreased stress, is no longer working outside the home and is walking for exercise  Patient is requesting future order for inflammation markers (sed rate and CRP) for next "flare" of pain Will route to PCP Korynne Dols, Orvis Brill, RN

## 2016-03-06 NOTE — Telephone Encounter (Signed)
Will forward to triage nurse. Brailee Riede,CMA  

## 2016-03-06 NOTE — Progress Notes (Signed)
Quick Note:  Labs all ok and no celiac disease ______

## 2016-03-06 NOTE — Telephone Encounter (Signed)
Patient has appt with PCP already scheduled for 03/12/16 at 8:45 AM DUCATTE, Orvis Brill, RN

## 2016-03-12 ENCOUNTER — Ambulatory Visit (INDEPENDENT_AMBULATORY_CARE_PROVIDER_SITE_OTHER): Payer: 59 | Admitting: Family Medicine

## 2016-03-12 ENCOUNTER — Encounter: Payer: Self-pay | Admitting: Family Medicine

## 2016-03-12 VITALS — BP 110/64 | HR 67 | Ht 68.0 in | Wt 142.0 lb

## 2016-03-12 DIAGNOSIS — M797 Fibromyalgia: Secondary | ICD-10-CM

## 2016-03-12 DIAGNOSIS — K580 Irritable bowel syndrome with diarrhea: Secondary | ICD-10-CM

## 2016-03-12 DIAGNOSIS — R319 Hematuria, unspecified: Secondary | ICD-10-CM | POA: Diagnosis not present

## 2016-03-12 DIAGNOSIS — I959 Hypotension, unspecified: Secondary | ICD-10-CM

## 2016-03-12 DIAGNOSIS — M79609 Pain in unspecified limb: Secondary | ICD-10-CM | POA: Diagnosis not present

## 2016-03-12 DIAGNOSIS — D509 Iron deficiency anemia, unspecified: Secondary | ICD-10-CM | POA: Diagnosis not present

## 2016-03-12 MED ORDER — DICLOFENAC SODIUM 1 % TD GEL: 2.0000 g | Freq: Four times a day (QID) | TRANSDERMAL | Status: DC | PRN

## 2016-03-12 NOTE — Patient Instructions (Addendum)
Good to see you today!  Thanks for coming in.  Try voltaren gel on all spots that hurt when you have an attack Also take one tylenol at the same time If not better in 2 hours then take one aleve  Bring in all your medications next visit  Come back in 2 weeks

## 2016-03-13 NOTE — Progress Notes (Signed)
   Subjective:    Patient ID: Emily Joseph, female    DOB: May 26, 1971, 45 y.o.   MRN: [id number]  HPI  Limb Pain Has episodes of severe pain in elbows and wrists and knees.  Not the whole joint but lateral or medial areas.  Pain is 8-9/10 at times.  Can not relate to any activity.  No redness or soft tissue swelling or fever or rash.   Takes tylenol and or aleve and rarely a flexeril.  Can be incapacitating  Mouth pain - seeing oral surgeon Dr Satira Sark for receding gums  Irritable bowel syndrome - having intermittent diarrhea but is relatiely well controlled.  No abdomen pain today.  No bleeding or vomtiing.  Undergoing GI work up with Dr Carlean Purl.  Has not started the PPI as recommended   Dysuria - seeing Dr McDiarmid and is undergoing Urology Physical Therapy  Anemia - is sp hysterectomy.  Finished taking iron.  Still has fatigue but she thinks maybe related for fibromyalgia and recent stressor with fathers death  Has not followed up with counseling   Chief Complaint noted Review of Symptoms - see HPI PMH - Smoking status noted.   Vital Signs reviewed    Review of Systems     Objective:   Physical Exam  Alert interactive NAD Does not have pain now Has FROM of all joints No deformity or effusion  Is mildly tender over medial  And lateral epicondyles and medial knee Skin:  Intact without suspicious lesions or rashes.  Does have dry cracked finger tips Heart - Regular rate and rhythm.  No murmurs, gallops or rubs.    Lungs:  Normal respiratory effort, chest expands symmetrically. Lungs are clear to auscultation, no crackles or wheezes.      Assessment & Plan:   She is clearly anxious and may have a somatization disorder.  She has been resistant to discussion of this and to counseling Will see her back shortly to try to focus on this area of her treatment

## 2016-03-13 NOTE — Assessment & Plan Note (Signed)
BP Readings from Last 3 Encounters:  03/12/16 110/64  03/04/16 102/68  10/09/15 119/74   Patient has low normal blood pressure for years without any documented severely low readings. Dr Carlean Purl mentioned dysautonomia as a possibility for her fatigue and lightheadness.  Will review this in future visits

## 2016-03-13 NOTE — Assessment & Plan Note (Signed)
New.   Does not seem consistent with any arthritis or even arthralgia.  Could be a tendonitis but the occurrence of multipe sites all at once is atypical as is the sudden onset of episodes.  This is probably more of a fibromyalgia variant.  It is also consistent with her multiple sites of pain - mouth, abdomen, bladder.   Will treat symptomatically and discuss further next visit

## 2016-03-13 NOTE — Assessment & Plan Note (Signed)
Resolved after hysterectomy and iron replacement

## 2016-03-26 ENCOUNTER — Encounter: Payer: Self-pay | Admitting: Family Medicine

## 2016-03-26 ENCOUNTER — Ambulatory Visit (INDEPENDENT_AMBULATORY_CARE_PROVIDER_SITE_OTHER): Payer: 59 | Admitting: Family Medicine

## 2016-03-26 VITALS — BP 123/67 | HR 83 | Temp 98.3°F | Ht 68.0 in | Wt 139.0 lb

## 2016-03-26 DIAGNOSIS — I959 Hypotension, unspecified: Secondary | ICD-10-CM | POA: Diagnosis not present

## 2016-03-26 DIAGNOSIS — M797 Fibromyalgia: Secondary | ICD-10-CM | POA: Diagnosis not present

## 2016-03-26 MED ORDER — GABAPENTIN 100 MG PO CAPS: 100.0000 mg | ORAL_CAPSULE | Freq: Three times a day (TID) | ORAL | Status: DC

## 2016-03-26 NOTE — Patient Instructions (Addendum)
Take 1/4 of flexaril when you are in pain and tylenol and ibuprofen and voltaren is not helping  I will refer you to cardiology  Try gabapentin 1 tab as needed for pain start at bedtime  Come back in 2 weeks

## 2016-03-27 NOTE — Assessment & Plan Note (Signed)
This seems to me to be the most identifiable source of her pain.  She is very concerned may have another disorder and needs something to help her stay functional.  Does not want to consider counseling.  Willing to try gabapentin.

## 2016-03-27 NOTE — Progress Notes (Signed)
   Subjective:    Patient ID: Emily Joseph, female    DOB: [birth year], 45 y.o.   MRN: [id number]  HPI  Followup from visit 2 weeks ago  Limb and Body Pain Less episodes of severe pain in elbows and wrists and knees. No more whole body pain.  Did not try voltaren gel.  Has not taken any flexaril.   States has had pain 4/10 for many years but now is 8/10 sometimes and she has trouble doing her ADLS.  She brings in bottles of Soma and Oxycontin and Xanax to get rid of since she does not want to take any medicines like these  Can not relate to any activity.  No redness or soft tissue swelling or fever or rash.   Takes tylenol and or aleve at low dose.   Has not followed up with counseling does not think it would be of any benefit   Rapid heart rate Has had episodes of this and has one documented on her fitibit of 150 while she was driving.  Also history of low range blood pressure in past.  No focal exertional chest pain or syncope.  Has had extensive rheum and endocrine work up   Chief Complaint noted Review of Symptoms - see HPI PMH - Smoking status noted.   Vital Signs reviewed    Review of Systems      Objective:   Physical Exam  Moves without obvious distress No signs of joint swelling or redness Pulse - normal rate and rhythm     Assessment & Plan:

## 2016-03-27 NOTE — Assessment & Plan Note (Signed)
BP Readings from Last 3 Encounters:  03/26/16 123/67  03/12/16 110/64  03/04/16 102/68   Had low normal blood pressure on last few visits with normal pulse rates.  Unsure if her fitbit reading of 150 was accurate.  However given her concerns and Dr Celesta Aver recommendation will refer to cardiology likely for cardiac monitoring

## 2016-04-17 ENCOUNTER — Ambulatory Visit (INDEPENDENT_AMBULATORY_CARE_PROVIDER_SITE_OTHER): Payer: 59 | Admitting: Family Medicine

## 2016-04-17 ENCOUNTER — Telehealth: Payer: Self-pay | Admitting: Family Medicine

## 2016-04-17 ENCOUNTER — Encounter: Payer: Self-pay | Admitting: Family Medicine

## 2016-04-17 VITALS — BP 120/80 | HR 81 | Temp 98.2°F | Ht 67.75 in | Wt 140.2 lb

## 2016-04-17 DIAGNOSIS — R5383 Other fatigue: Secondary | ICD-10-CM

## 2016-04-17 DIAGNOSIS — Z7189 Other specified counseling: Secondary | ICD-10-CM | POA: Diagnosis not present

## 2016-04-17 DIAGNOSIS — M2550 Pain in unspecified joint: Secondary | ICD-10-CM

## 2016-04-17 DIAGNOSIS — G8929 Other chronic pain: Secondary | ICD-10-CM | POA: Diagnosis not present

## 2016-04-17 DIAGNOSIS — K209 Esophagitis, unspecified without bleeding: Secondary | ICD-10-CM

## 2016-04-17 DIAGNOSIS — Z7689 Persons encountering health services in other specified circumstances: Secondary | ICD-10-CM

## 2016-04-17 NOTE — Patient Instructions (Signed)
Before you leave: -follow up in 1- 2 months  It was so nice to meet you. Hang in there while we work together to find a way to help you feel better!  Please let us know which rheumatologist you want to see and we will send the referral.  Start the counseling to help with the grief, stress, anxiety and living with chronic pain.  Consider cymbalta.

## 2016-04-17 NOTE — Telephone Encounter (Signed)
Pt would like to see dr Trudie Reed or any one in group for joint pain. Pt has [facility]

## 2016-04-17 NOTE — Telephone Encounter (Signed)
Patient called back and I informed her the referral was entered, per Dr Maudie Mercury she forgot to get a copy of the lab results she had with her at the appt today and asked that she come back for [place] to make a copy and she agreed.

## 2016-04-17 NOTE — Progress Notes (Signed)
HPI:  Emily Joseph is here to establish care. Not happy with current PCP she had been with for 20 years as feels he is not taking the time to listen to her.  Last PCP and physical: sees gynecologist for gyn exams  Has the following chronic problems that require follow up and concerns today:  Chronic pain: -she is worried she may have underlying undiagnosed rheumatological condition and wants referral to rheumatologist -pain all over her whole life, headaches her whole life -Master degree in mental health, SW but not working as feels can barely function just to keep house and take care of children -worse the last 2 years,  now with worsening intermittent pain in the elbows, wrists, hands and knees and ankles; feels they intermittently get warm as well; r rib pain, headaches on top of head -She also intermittently has overwhelming fatigue and malaise, sometimes feels like she has a low-grade fever, this seems worse with exertion -she reports RF + and thyroid antibodies positive at integr[facility] practice (Emily worhl, FNP) in the past; ? 3/5 positive lyme titers  -reports saw Emily Joseph, but he didn't do any blood tests and told her to take flexeril -finishing up 6 months of PT for dysuria and back strain with rectal nerve pain in 2015 (saw neurologist for this too) -she has a Dx of fibromyalgia, but she is convince she has something else like RA -takes tylenol and advil for her pain daily -prior docs have rxd flexeril, soma, oxycontin, xanax, neurontin, protonix but she does not want to take as is afraid of taking medications -has many food allergies - sees Emily Joseph for this, she is trying to eat healthy and has eliminated gluten from her diet -No skin rash, unexplained weight loss, shortness of breath (history possible mild asthma, but not using albuterol), documented fevers  She had extensive labs done with her gastroenterologist recently, reviewed and included a CMP, CBC, TSH, celiac  testing, ferritin - all unremarkable  Also reports: Hx failed gum graft, sees dentist Holds her breath, seeing ENT - reports narrow nasal passages Nails won't grow Sees neurology, Emily Joseph, for back pain with radicular symptoms in the past Has anxiety; admits needs counseling, she is set up to see counselor, has not started this yet Has esophagitis - GI advised protonix, she wants my input on this as she is afraid to take it Has hx hematuria - sees urologist for this, reports told it was not concerning Palpitations, reports for her whole life, reports has had orthostatic issues her whole life - she has an appointment with cardiology to evaluation next week She is very anxious about taking medications. She wants answers. Wants to "know what I have" She offers to pay me $500 an hour to take the time to listen to her, as she feels other doctors have not taken the time to listen   ROS negative for unless reported above: fevers, unintentional weight loss, hearing or vision loss, chest pain, struggling to breath, hemoptysis, melena, hematochezia, falls, loc, si, thoughts of self harm  Past Medical History  Diagnosis Date  . Iron deficiency anemia due to chronic blood loss     Menorrhagia, s/p complete hysterectomy, ovaries remain  . GERD (gastroesophageal reflux disease)   . Syncope     with last child with epideral  . Adenomyosis     not papanicolaou smear of cervix and cervical HPV  . Shortness of breath     with allergies  . Fibromyalgia   .  Complication of anesthesia     BP "dropped" during epidural  . PONV (postoperative nausea and vomiting)   . Microhematuria   . Eosinophilic esophagitis     Sees Dr. Carlean Joseph  . SUI (stress urinary incontinence, female)     Past Surgical History  Procedure Laterality Date  . Appendectomy    . Ovarian cyst removal    . Esophagogastroduodenoscopy (egd) with propofol N/A 02/09/2013    Procedure: ESOPHAGOGASTRODUODENOSCOPY (EGD) WITH PROPOFOL;   Surgeon: Emily Silence, MD;  Location: [facility];  Service: Endoscopy;  Laterality: N/A;  . Colonoscopy with propofol N/A 02/09/2013    Procedure: COLONOSCOPY WITH PROPOFOL;  Surgeon: Emily Silence, MD;  Location: [facility];  Service: Endoscopy;  Laterality: N/A;  need ultra thin colon scope  . Balloon dilation N/A 02/09/2013    Procedure: BALLOON DILATION;  Surgeon: Emily Silence, MD;  Location: [facility];  Service: Endoscopy;  Laterality: N/A;  . Laparoscopic assisted vaginal hysterectomy Right 06/14/2014    Procedure: OPEN LAPAROSCOPIC ASSISTED VAGINAL HYSTERECTOMY;  Surgeon: Emily Katz, MD;  Location: [facility];  Service: Gynecology;  Laterality: Right;  . Bilateral salpingectomy N/A 06/14/2014    Procedure: BILATERAL SALPINGECTOMY;  Surgeon: Emily Katz, MD;  Location: [facility];  Service: Gynecology;  Laterality: N/A;  . Lysis of adhesion N/A 06/14/2014    Procedure: LYSIS OF ADHESION;  Surgeon: Emily Katz, MD;  Location: [facility];  Service: Gynecology;  Laterality: N/A;    Family History  Problem Relation Age of Onset  . Cancer Mother   . Cancer Maternal Grandfather   . Stroke Paternal Grandfather   . Kidney disease Paternal Grandfather     Social History   Social History  . Marital Status: Married    Spouse Name: Emily Joseph   . Number of Children: 4  . Years of Education: 12+   Social History Main Topics  . Smoking status: Never Smoker   . Smokeless tobacco: Never Used  . Alcohol Use: 0.0 oz/week    0 Standard drinks or equivalent per week     Comment: 3 glasses wine per week  . Drug Use: No  . Sexual Activity: Not Asked   Other Topics Concern  . None   Social History Narrative   Social History:   Updated: 04/2016   Now stays at home. Husband travels and works a lot.   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 Avoiding gluten currently.           Current outpatient prescriptions:  .  acetaminophen (TYLENOL) 650 MG suppository, Place 650 mg rectally every 4 (four) hours as needed., Disp: , Rfl:  .  albuterol (PROVENTIL HFA;VENTOLIN HFA) 108 (90 BASE) MCG/ACT inhaler, Inhale 2 puffs into the lungs every 6 (six) hours as needed for wheezing., Disp: , Rfl:  .  diclofenac sodium (VOLTAREN) 1 % GEL, Apply 2 g topically 4 (four) times daily as needed., Disp: 100 g, Rfl: 1 .  docusate sodium (COLACE) 100 MG capsule, Take 200 mg by mouth daily as needed for moderate constipation. Reported on 03/12/2016, Disp: , Rfl:  .  fluticasone (FLONASE) 50 MCG/ACT nasal spray, Place 2 sprays into both nostrils daily., Disp: 16 g, Rfl: 6 .  naproxen sodium (ALEVE) 220 MG tablet, Take 220 mg by mouth 2 (two) times daily as needed., Disp: , Rfl:  .  OVER THE COUNTER MEDICATION, Take 1 capsule by mouth daily. Patient takes methylated folate., Disp: , Rfl:  .  pantoprazole (PROTONIX) 40 MG tablet, Take 1 tablet (40 mg total) by mouth daily., Disp: 90 tablet, Rfl: 3 .  vitamin B-12 (CYANOCOBALAMIN) 1000 MCG tablet, Take 1,000 mcg by mouth daily., Disp: , Rfl:   EXAM:  Filed Vitals:   04/17/16 1135  BP: 120/80  Pulse: 81  Temp: 98.2 F (36.8 C)    Body mass index is 21.47 kg/(m^2).  GENERAL: vitals reviewed and listed above, alert, oriented, appears well hydrated and in no acute distress  HEENT: atraumatic, conjunttiva clear, no obvious abnormalities on inspection of external nose and ears, normal inspection of ear canals and TMs, normal inspection of the oropharynx and nasal membranes  NECK: no obvious masses on inspection, normal palpation of the thyroid gland  LUNGS: clear to auscultation bilaterally, no wheezes, rales or rhonchi, good air movement  CV: HRRR, no peripheral edema  MS: moves all extremities without  noticeable abnormality, normal inspection of the elbows, knees, wrists, hands, ankles without appreciable erythema, warmth or swelling. She is significantly tender even to light touch in multiple areas including soft tissues and joints.  PSYCH: pleasant and cooperative, no obvious depression or anxiety  ASSESSMENT AND PLAN:  Discussed the following assessment and plan: > 40 minutes spent with this patient with > 50% spent face to face.  Chronic pain Polyarthralgia Other fatigue -listened, spent at least 40 face to face -reviewed recent labs with her -assistant to copy and provide prior inflammatory lab results -happy to refer per her request - she plans to call back with names of rheumatologists she wants to see -offered repeat rheum labs here - she declined as wants to do with rheumatologist, reasonable given the degree of pain and suffering she has been having for a long time -discussed thyroid conditions and further eval for this, opted to hold off for now, recent TSH normal -advised tx anxiety/stress/emotional health while working to find answers and or help -advised modern western medicine does not always have the answers or treatment -discussed various causes chronic pain and fatigue, may have CME, but will defer to rheum to r/o other conditions first and determine -in interim we will do what we can to support the body physically, mentally, spiritually -healthy diet, regular exercise, counseling -considering cymbalta, discussed risks/benefits - may help even if for a short term with symptoms, she is anxious about medications and wants to try CBT first -advised I will do my best to help, advise of limitations with time with appts in our off - not my preference (she is understanding) -close follow up in 1-2 months -I did not have time today to do a good social hx - do want to delve further for prior traumatic events/ect if possible in follow up  Esophagitis -seesing Dr. Carlean Joseph,  advised I trust his expertise and did advise following his recommendations and if side effects to let him know  Encounter to establish care with new doctor -We reviewed the PMH, PSH, FH, SH, Meds and Allergies. -We provided refills for any medications we will prescribe as needed. -We addressed current concerns per orders and patient instructions. -We have asked for records for pertinent exams, studies, vaccines and notes from previous providers. -We have advised patient to follow up per instructions below.   -  Patient advised to return or notify a doctor immediately if symptoms worsen or persist or new concerns arise.  Patient Instructions  Before you leave: -follow up in 1- 2 months  It was so nice to meet you. Hang in there while we work together to find a way to help you feel better!  Please let us know which rheumatologist you want to see and we will send the referral.  Start the counseling to help with the grief, stress, anxiety and living with chronic pain.  Consider cymbalta.       Colin Benton R.

## 2016-04-17 NOTE — Telephone Encounter (Signed)
Referral placed and I left a message for the pt to return my call.

## 2016-04-17 NOTE — Progress Notes (Signed)
Pre visit review using our clinic review tool, if applicable. No additional management support is needed unless otherwise documented below in the visit note. 

## 2016-04-22 ENCOUNTER — Ambulatory Visit: Payer: 59 | Admitting: Internal Medicine

## 2016-05-06 ENCOUNTER — Ambulatory Visit: Payer: 59 | Admitting: Family Medicine

## 2016-05-12 ENCOUNTER — Other Ambulatory Visit: Payer: Self-pay | Admitting: Obstetrics and Gynecology

## 2016-05-12 DIAGNOSIS — Z1231 Encounter for screening mammogram for malignant neoplasm of breast: Secondary | ICD-10-CM

## 2016-05-12 DIAGNOSIS — N644 Mastodynia: Secondary | ICD-10-CM

## 2016-05-12 DIAGNOSIS — Z803 Family history of malignant neoplasm of breast: Secondary | ICD-10-CM

## 2016-05-19 NOTE — Progress Notes (Signed)
Cardiology Office Note    Date:  05/21/2016   ID:  Emily Joseph, DOB 02-Oct-1971, MRN [id number]  PCP:  Lucretia Kern., DO  Cardiologist:  Kirk Ruths, MD     History of Present Illness:  Emily Joseph is a 45 y.o. female for evaluation of tachycardia, dyspnea and orthostasis. Patient describes dyspnea on exertion. No orthopnea, PND, pedal edema, exertional chest pain. She has had syncopal episodes in the past while getting epidurals but nothing in the recent past. She notes palpitations with exertion and states her heart rate has been as high as 150. She denies dizziness with standing. Because of the above we were asked to evaluate.    Past Medical History  Diagnosis Date  . Iron deficiency anemia due to chronic blood loss     Menorrhagia, s/p complete hysterectomy, ovaries remain  . GERD (gastroesophageal reflux disease)   . Syncope     with last child with epideral  . Adenomyosis     not papanicolaou smear of cervix and cervical HPV  . Fibromyalgia   . Microhematuria   . Eosinophilic esophagitis     Sees Dr. Carlean Purl  . SUI (stress urinary incontinence, female)   . Asthma   . Palpitations     Past Surgical History  Procedure Laterality Date  . Appendectomy    . Ovarian cyst removal    . Esophagogastroduodenoscopy (egd) with propofol N/A 02/09/2013    Procedure: ESOPHAGOGASTRODUODENOSCOPY (EGD) WITH PROPOFOL;  Surgeon: Arta Silence, MD;  Location: [facility];  Service: Endoscopy;  Laterality: N/A;  . Colonoscopy with propofol N/A 02/09/2013    Procedure: COLONOSCOPY WITH PROPOFOL;  Surgeon: Arta Silence, MD;  Location: [facility];  Service: Endoscopy;  Laterality: N/A;  need ultra thin colon scope  . Balloon dilation N/A 02/09/2013    Procedure: BALLOON DILATION;  Surgeon: Arta Silence, MD;  Location: [facility];  Service: Endoscopy;  Laterality: N/A;  . Laparoscopic assisted vaginal hysterectomy Right 06/14/2014    Procedure: OPEN LAPAROSCOPIC ASSISTED VAGINAL  HYSTERECTOMY;  Surgeon: Allena Katz, MD;  Location: [facility];  Service: Gynecology;  Laterality: Right;  . Bilateral salpingectomy N/A 06/14/2014    Procedure: BILATERAL SALPINGECTOMY;  Surgeon: Allena Katz, MD;  Location: [facility];  Service: Gynecology;  Laterality: N/A;  . Lysis of adhesion N/A 06/14/2014    Procedure: LYSIS OF ADHESION;  Surgeon: Allena Katz, MD;  Location: [facility];  Service: Gynecology;  Laterality: N/A;    Current Medications: Outpatient Prescriptions Prior to Visit  Medication Sig Dispense Refill  . acetaminophen (TYLENOL) 650 MG suppository Place 650 mg rectally every 4 (four) hours as needed.    Marland Kitchen albuterol (PROVENTIL HFA;VENTOLIN HFA) 108 (90 BASE) MCG/ACT inhaler Inhale 2 puffs into the lungs every 6 (six) hours as needed for wheezing.    . diclofenac sodium (VOLTAREN) 1 % GEL Apply 2 g topically 4 (four) times daily as needed. 100 g 1  . docusate sodium (COLACE) 100 MG capsule Take 200 mg by mouth daily as needed for moderate constipation. Reported on 03/12/2016    . fluticasone (FLONASE) 50 MCG/ACT nasal spray Place 2 sprays into both nostrils daily. 16 g 6  . naproxen sodium (ALEVE) 220 MG tablet Take 220 mg by mouth 2 (two) times daily as needed.    Marland Kitchen OVER THE COUNTER MEDICATION Take 1 capsule by mouth daily. Patient takes methylated folate.    . pantoprazole (PROTONIX) 40 MG tablet Take 1 tablet (40  mg total) by mouth daily. 90 tablet 3  . vitamin B-12 (CYANOCOBALAMIN) 1000 MCG tablet Take 1,000 mcg by mouth daily.     No facility-administered medications prior to visit.     Allergies:   Avocado; Other; Peanuts; Ciprofloxacin; Demerol; Latex; Macadamia nut oil; Mango flavor; Sulfamethoxazole; and Iodinated diagnostic agents   Social History   Social History  . Marital Status: Married    Spouse Name: [name]   . Number of Children: 4  . Years of Education: 12+   Social History Main Topics  . Smoking status: Never Smoker   . Smokeless tobacco:  Never Used  . Alcohol Use: 0.0 oz/week    0 Standard drinks or equivalent per week     Comment: 3 glasses wine per week  . Drug Use: No  . Sexual Activity: Not Asked   Other Topics Concern  . None   Social History Narrative   Social History:   Updated: 04/2016   Now stays at home. Husband travels and works a lot.   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 Avoiding gluten currently.           Family History:  The patient's family history includes CAD in her father; Cancer in her maternal grandfather and mother; Kidney disease in her paternal grandfather; Stroke in her father and paternal grandfather.   ROS:   Please see the history of present illness.    Microhematuria, Fatigue but no weight loss, productive cough, hemoptysis, dysphagia, odynophagia, melena, hematochezia, dysuria, hematuria, rash, seizure activity, orthopnea, PND, pedal edema, claudication. All remaining systems negative.   PHYSICAL EXAM:   VS:  BP 116/72 mmHg  Pulse 86  Ht 5\' 7"  (1.702 m)  Wt 138 lb (62.596 kg)  BMI 21.61 kg/m2  LMP 05/20/2014   GEN: Well nourished, well developed, in no acute distress HEENT: normal Neck: no JVD, carotid bruits, or masses Cardiac: RRR; no murmurs, rubs, or gallops,no edema  Respiratory:  clear to auscultation bilaterally, normal work of breathing GI: soft, nontender, nondistended, + BS MS: no deformity or atrophy Skin: warm and dry, no rash Neuro:  Alert and Oriented x 3, Strength and sensation are intact Psych: euthymic mood, full affect  Wt Readings from Last 3 Encounters:  05/21/16 138 lb (62.596 kg)  04/17/16 140 lb 3.2 oz (63.594 kg)  03/26/16 139 lb (63.05 kg)      Studies/Labs Reviewed:   EKG:  EKG Sinus rhythm at a rate of  86. Incomplete right bundle branch block.  Recent Labs: 03/04/2016: ALT 13; BUN 13; Creatinine, Ser 0.66; Hemoglobin 13.9; Platelets 259.0; Potassium 3.9; Sodium 137; TSH 0.82      A/P  1 Dyspnea-etiology unclear. Not volume overloaded on examination.Plan echocardiogram to assess LV function and exercise treadmill.  2 palpitations-she notes elevated heart rate and palpitations with exertion. We will arrange exercise treadmill to evaluate heart rate with activities.  3 history of mild orthostasis-she is not having significant symptoms. We discussed importance of maintaining adequate hydration and sodium intake.    Medication Adjustments/Labs and Tests Ordered: Current medicines are reviewed at length with the patient today.  Concerns regarding medicines are outlined above.  Medication changes, Labs and Tests ordered today are listed in the Patient  Instructions below. There are no Patient Instructions on file for this visit.   Signed, Kirk Ruths, MD  05/21/2016 9:16 AM    [place]

## 2016-05-21 ENCOUNTER — Ambulatory Visit (INDEPENDENT_AMBULATORY_CARE_PROVIDER_SITE_OTHER): Payer: 59 | Admitting: Cardiology

## 2016-05-21 ENCOUNTER — Encounter: Payer: Self-pay | Admitting: Cardiology

## 2016-05-21 VITALS — BP 116/72 | HR 86 | Ht 67.0 in | Wt 138.0 lb

## 2016-05-21 DIAGNOSIS — R002 Palpitations: Secondary | ICD-10-CM

## 2016-05-21 DIAGNOSIS — I951 Orthostatic hypotension: Secondary | ICD-10-CM | POA: Diagnosis not present

## 2016-05-21 DIAGNOSIS — R0600 Dyspnea, unspecified: Secondary | ICD-10-CM

## 2016-05-21 NOTE — Patient Instructions (Signed)
Your physician has requested that you have an echocardiogram. Echocardiography is a painless test that uses sound waves to create images of your heart. It provides your doctor with information about the size and shape of your heart and how well your heart's chambers and valves are working. This procedure takes approximately one hour. There are no restrictions for this procedure.   Your physician has requested that you have an exercise tolerance test. For further information please visit [email or web address]. Please also follow instruction sheet, as given.   Your physician recommends that you schedule a follow-up appointment as needed pending test results.   Exercise Stress Electrocardiogram An exercise stress electrocardiogram is a test to check how blood flows to your heart. It is done to find areas of poor blood flow. You will need to walk on a treadmill for this test. The electrocardiogram will record your heartbeat when you are at rest and when you are exercising. BEFORE THE PROCEDURE  Do not have drinks with caffeine or foods with caffeine for 24 hours before the test, or as told by your doctor. This includes coffee, tea (even decaf tea), sodas, chocolate, and cocoa.  Follow your doctor's instructions about eating and drinking before the test.  Ask your doctor what medicines you should or should not take before the test. Take your medicines with water unless told by your doctor not to.  If you use an inhaler, bring it with you to the test.  Bring a snack to eat after the test.  Do not  smoke for 4 hours before the test.  Do not put lotions, powders, creams, or oils on your chest before the test.  Wear comfortable shoes and clothing. PROCEDURE  You will have patches put on your chest. Small areas of your chest may need to be shaved. Wires will be connected to the patches.  Your heart rate will be watched while you are resting and while you are exercising.  You will walk on the  treadmill. The treadmill will slowly get faster to raise your heart rate.  The test will take about 1-2 hours. AFTER THE PROCEDURE  Your heart rate and blood pressure will be watched after the test.  You may return to your normal diet, activities, and medicines or as told by your doctor.   This information is not intended to replace advice given to you by your health care provider. Make sure you discuss any questions you have with your health care provider.   Document Released: 04/14/2008 Document Revised: 11/17/2014 Document Reviewed: 07/04/2013 Elsevier Interactive Patient Education [employer].

## 2016-05-28 ENCOUNTER — Ambulatory Visit
Admission: RE | Admit: 2016-05-28 | Discharge: 2016-05-28 | Disposition: A | Discharge status: Home / Self Care | Payer: 59 | Source: Ambulatory Visit | Attending: Obstetrics and Gynecology | Admitting: Obstetrics and Gynecology

## 2016-05-28 DIAGNOSIS — N644 Mastodynia: Secondary | ICD-10-CM

## 2016-05-29 ENCOUNTER — Ambulatory Visit (INDEPENDENT_AMBULATORY_CARE_PROVIDER_SITE_OTHER): Payer: 59 | Admitting: Family Medicine

## 2016-05-29 ENCOUNTER — Encounter: Payer: Self-pay | Admitting: Family Medicine

## 2016-05-29 ENCOUNTER — Other Ambulatory Visit: Payer: Self-pay | Admitting: Obstetrics and Gynecology

## 2016-05-29 VITALS — BP 120/72 | HR 95 | Temp 98.2°F | Ht 67.0 in | Wt 138.3 lb

## 2016-05-29 DIAGNOSIS — J069 Acute upper respiratory infection, unspecified: Secondary | ICD-10-CM | POA: Diagnosis not present

## 2016-05-29 DIAGNOSIS — N644 Mastodynia: Secondary | ICD-10-CM

## 2016-05-29 NOTE — Progress Notes (Signed)
HPI:  Acute visit for cough: -started: last week -symptoms:nasal congestion, sore throat, cough -denies:fever, SOB, NVD, tooth pain, sinus pain -has tried: benadryl -sick contacts/travel/risks: no reported flu, strep or tick exposure - son and husband with the same and dx with VURI -Hx of: allergies   Chronic fatigue, myalgias: -seeing rheum -brought old labs with her today -reports just saw rheum and all the inflammatory labs were repeated and were normal -seeing acupuncturist and massage therapist  ROS: See pertinent positives and negatives per HPI.  Past Medical History  Diagnosis Date  . Iron deficiency anemia due to chronic blood loss     Menorrhagia, s/p complete hysterectomy, ovaries remain  . GERD (gastroesophageal reflux disease)   . Syncope     with last child with epideral  . Adenomyosis     not papanicolaou smear of cervix and cervical HPV  . Fibromyalgia   . Microhematuria   . Eosinophilic esophagitis     Sees Dr. Carlean Purl  . SUI (stress urinary incontinence, female)   . Asthma   . Palpitations     Past Surgical History  Procedure Laterality Date  . Appendectomy    . Ovarian cyst removal    . Esophagogastroduodenoscopy (egd) with propofol N/A 02/09/2013    Procedure: ESOPHAGOGASTRODUODENOSCOPY (EGD) WITH PROPOFOL;  Surgeon: Arta Silence, MD;  Location: [facility];  Service: Endoscopy;  Laterality: N/A;  . Colonoscopy with propofol N/A 02/09/2013    Procedure: COLONOSCOPY WITH PROPOFOL;  Surgeon: Arta Silence, MD;  Location: [facility];  Service: Endoscopy;  Laterality: N/A;  need ultra thin colon scope  . Balloon dilation N/A 02/09/2013    Procedure: BALLOON DILATION;  Surgeon: Arta Silence, MD;  Location: [facility];  Service: Endoscopy;  Laterality: N/A;  . Laparoscopic assisted vaginal hysterectomy Right 06/14/2014    Procedure: OPEN LAPAROSCOPIC ASSISTED VAGINAL HYSTERECTOMY;  Surgeon: Allena Katz, MD;  Location: [facility];  Service:  Gynecology;  Laterality: Right;  . Bilateral salpingectomy N/A 06/14/2014    Procedure: BILATERAL SALPINGECTOMY;  Surgeon: Allena Katz, MD;  Location: [facility];  Service: Gynecology;  Laterality: N/A;  . Lysis of adhesion N/A 06/14/2014    Procedure: LYSIS OF ADHESION;  Surgeon: Allena Katz, MD;  Location: [facility];  Service: Gynecology;  Laterality: N/A;    Family History  Problem Relation Age of Onset  . Cancer Mother   . Cancer Maternal Grandfather   . Stroke Paternal Grandfather   . Kidney disease Paternal Grandfather   . CAD Father     MI at age 33  . Stroke Father     Social History   Social History  . Marital Status: Married    Spouse Name: [name]   . Number of Children: 4  . Years of Education: 12+   Social History Main Topics  . Smoking status: Never Smoker   . Smokeless tobacco: Never Used  . Alcohol Use: 0.0 oz/week    0 Standard drinks or equivalent per week     Comment: 3 glasses wine per week  . Drug Use: No  . Sexual Activity: Not Asked   Other Topics Concern  . None   Social History Narrative   Social History:   Updated: 04/2016   Now stays at home. Husband travels and works a lot.   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 Avoiding gluten currently.           Current outpatient prescriptions:  .  acetaminophen (TYLENOL) 650 MG suppository, Place 650 mg rectally every 4 (four) hours as needed., Disp: , Rfl:  .  albuterol (PROVENTIL HFA;VENTOLIN HFA) 108 (90 BASE) MCG/ACT inhaler, Inhale 2 puffs into the lungs every 6 (six) hours as needed for wheezing., Disp: , Rfl:  .  diclofenac sodium (VOLTAREN) 1 % GEL, Apply 2 g topically 4 (four) times daily as needed., Disp: 100 g, Rfl: 1 .  docusate sodium (COLACE) 100  MG capsule, Take 200 mg by mouth daily as needed for moderate constipation. Reported on 03/12/2016, Disp: , Rfl:  .  fluticasone (FLONASE) 50 MCG/ACT nasal spray, Place 2 sprays into both nostrils daily., Disp: 16 g, Rfl: 6 .  naproxen sodium (ALEVE) 220 MG tablet, Take 220 mg by mouth 2 (two) times daily as needed., Disp: , Rfl:  .  OVER THE COUNTER MEDICATION, Take 1 capsule by mouth daily. Patient takes methylated folate., Disp: , Rfl:  .  pantoprazole (PROTONIX) 40 MG tablet, Take 1 tablet (40 mg total) by mouth daily., Disp: 90 tablet, Rfl: 3 .  vitamin B-12 (CYANOCOBALAMIN) 1000 MCG tablet, Take 1,000 mcg by mouth daily., Disp: , Rfl:   EXAM:  Filed Vitals:   05/29/16 1445  BP: 120/72  Pulse: 95  Temp: 98.2 F (36.8 C)    Body mass index is 21.66 kg/(m^2).  GENERAL: vitals reviewed and listed above, alert, oriented, appears well hydrated and in no acute distress  HEENT: atraumatic, conjunttiva clear, no obvious abnormalities on inspection of external nose and ears, normal appearance of ear canals and TMs, clear nasal congestion, mild post oropharyngeal erythema with PND, no tonsillar edema or exudate, no sinus TTP  NECK: no obvious masses on inspection  LUNGS: clear to auscultation bilaterally, no wheezes, rales or rhonchi, good air movement  CV: HRRR, no peripheral edema  MS: moves all extremities without noticeable abnormality  PSYCH: pleasant and cooperative, no obvious depression or anxiety  ASSESSMENT AND PLAN:  Discussed the following assessment and plan:  Acute upper respiratory infection  -given HPI and exam findings today, a serious infection or illness is unlikely. We discussed potential etiologies, with VURI being most likely, and advised supportive care and monitoring. We discussed treatment side effects, likely course, antibiotic misuse, transmission, and signs of developing a serious illness. -of course, we advised to return or notify a doctor immediately if  symptoms worsen or persist or new concerns arise.    Patient Instructions  INSTRUCTIONS FOR UPPER RESPIRATORY INFECTION:  -plenty of rest and fluids  -nasal saline wash 2-3 times daily (use prepackaged nasal saline or bottled/distilled water if making your own)   -can use AFRIN nasal spray for 4-5 days.Do not use longer.  -can use tylenol (in no history of liver disease) or ibuprofen (if no history of kidney disease, bowel bleeding or significant heart disease) as directed for aches and sorethroat  -if you are taking a cough medication - use only as directed, may also try a teaspoon of honey to coat the throat and throat lozenges. If given a cough medication with codeine or hydrocodone or other narcotic please be advised that this contains a strong and  potentially addicting medication. Please follow instructions carefully, take as little as possible and only use AS NEEDED for  severe cough. Discuss potential side effects with your pharmacy. Please do not drive or operate machinery while taking these types of medications. Please do not take other sedating medications, drugs or alcohol while taking this medication without discussing with your doctor.  -for sore throat, salt water gargles can help  -follow up if you have fevers, facial pain, tooth pain, difficulty breathing or are worsening or symptoms persist longer then expected  Upper Respiratory Infection, Adult An upper respiratory infection (URI) is also known as the common cold. It is often caused by a type of germ (virus). Colds are easily spread (contagious). You can pass it to others by kissing, coughing, sneezing, or drinking out of the same glass. Usually, you get better in 1 to 3  weeks.  However, the cough can last for even longer. HOME CARE   Only take medicine as told by your doctor. Follow instructions provided above.  Drink enough water and fluids to keep your pee (urine) clear or pale yellow.  Get plenty of  rest.  Return to work when your temperature is < 100 for 24 hours or as told by your doctor. You may use a face mask and wash your hands to stop your cold from spreading. GET HELP RIGHT AWAY IF:   After the first few days, you feel you are getting worse.  You have questions about your medicine.  You have chills, shortness of breath, or red spit (mucus).  You have pain in the face for more then 1-2 days, especially when you bend forward.  You have a fever, puffy (swollen) neck, pain when you swallow, or white spots in the back of your throat.  You have a bad headache, ear pain, sinus pain, or chest pain.  You have a high-pitched whistling sound when you breathe in and out (wheezing).  You cough up blood.  You have sore muscles or a stiff neck. MAKE SURE YOU:   Understand these instructions.  Will watch your condition.  Will get help right away if you are not doing well or get worse. Document Released: 04/14/2008 Document Revised: 01/19/2012 Document Reviewed: 02/01/2014 [facility] Patient Information 2015 [place], [place]. This information is not intended to replace advice given to you by your health care provider. Make sure you discuss any questions you have with your health care provider.     Colin Benton R., DO

## 2016-05-29 NOTE — Progress Notes (Signed)
Pre visit review using our clinic review tool, if applicable. No additional management support is needed unless otherwise documented below in the visit note. 

## 2016-05-29 NOTE — Patient Instructions (Signed)
INSTRUCTIONS FOR UPPER RESPIRATORY INFECTION:  -plenty of rest and fluids  -nasal saline wash 2-3 times daily (use prepackaged nasal saline or bottled/distilled water if making your own)   -can use AFRIN nasal spray for 4-5 days.Do not use longer.  -can use tylenol (in no history of liver disease) or ibuprofen (if no history of kidney disease, bowel bleeding or significant heart disease) as directed for aches and sorethroat  -if you are taking a cough medication - use only as directed, may also try a teaspoon of honey to coat the throat and throat lozenges. If given a cough medication with codeine or hydrocodone or other narcotic please be advised that this contains a strong and  potentially addicting medication. Please follow instructions carefully, take as little as possible and only use AS NEEDED for severe cough. Discuss potential side effects with your pharmacy. Please do not drive or operate machinery while taking these types of medications. Please do not take other sedating medications, drugs or alcohol while taking this medication without discussing with your doctor.  -for sore throat, salt water gargles can help  -follow up if you have fevers, facial pain, tooth pain, difficulty breathing or are worsening or symptoms persist longer then expected  Upper Respiratory Infection, Adult An upper respiratory infection (URI) is also known as the common cold. It is often caused by a type of germ (virus). Colds are easily spread (contagious). You can pass it to others by kissing, coughing, sneezing, or drinking out of the same glass. Usually, you get better in 1 to 3  weeks.  However, the cough can last for even longer. HOME CARE   Only take medicine as told by your doctor. Follow instructions provided above.  Drink enough water and fluids to keep your pee (urine) clear or pale yellow.  Get plenty of rest.  Return to work when your temperature is < 100 for 24 hours or as told by your doctor.  You may use a face mask and wash your hands to stop your cold from spreading. GET HELP RIGHT AWAY IF:   After the first few days, you feel you are getting worse.  You have questions about your medicine.  You have chills, shortness of breath, or red spit (mucus).  You have pain in the face for more then 1-2 days, especially when you bend forward.  You have a fever, puffy (swollen) neck, pain when you swallow, or white spots in the back of your throat.  You have a bad headache, ear pain, sinus pain, or chest pain.  You have a high-pitched whistling sound when you breathe in and out (wheezing).  You cough up blood.  You have sore muscles or a stiff neck. MAKE SURE YOU:   Understand these instructions.  Will watch your condition.  Will get help right away if you are not doing well or get worse. Document Released: 04/14/2008 Document Revised: 01/19/2012 Document Reviewed: 02/01/2014 [facility] Patient Information 2015 [place], [place]. This information is not intended to replace advice given to you by your health care provider. Make sure you discuss any questions you have with your health care provider.

## 2016-05-30 ENCOUNTER — Ambulatory Visit
Admission: RE | Admit: 2016-05-30 | Discharge: 2016-05-30 | Disposition: A | Discharge status: Home / Self Care | Payer: 59 | Source: Ambulatory Visit | Attending: Obstetrics and Gynecology | Admitting: Obstetrics and Gynecology

## 2016-05-30 DIAGNOSIS — N644 Mastodynia: Secondary | ICD-10-CM

## 2016-06-04 ENCOUNTER — Other Ambulatory Visit (HOSPITAL_COMMUNITY): Payer: 59

## 2016-06-08 ENCOUNTER — Encounter: Payer: Self-pay | Admitting: Family Medicine

## 2016-06-08 DIAGNOSIS — G8929 Other chronic pain: Secondary | ICD-10-CM

## 2016-06-08 DIAGNOSIS — K219 Gastro-esophageal reflux disease without esophagitis: Secondary | ICD-10-CM | POA: Insufficient documentation

## 2016-06-08 DIAGNOSIS — R5382 Chronic fatigue, unspecified: Secondary | ICD-10-CM

## 2016-06-08 DIAGNOSIS — R002 Palpitations: Secondary | ICD-10-CM | POA: Insufficient documentation

## 2016-06-08 DIAGNOSIS — F419 Anxiety disorder, unspecified: Secondary | ICD-10-CM

## 2016-06-08 HISTORY — DX: Anxiety disorder, unspecified: F41.9

## 2016-06-08 HISTORY — DX: Other chronic pain: G89.29

## 2016-06-08 HISTORY — DX: Chronic fatigue, unspecified: R53.82

## 2016-06-08 NOTE — Progress Notes (Signed)
HPI: Emily Joseph recently established care here. She was not happy with with her prior PCP she had been with for 20 years as she felt  he is not taking the time to listen to her.  Last PCP and physical: sees gynecologist for gyn exams  I am trying to help her treat mind, body and spirit the best she can to optimize her chance of healing. Despite numerous evaluations with many doctors, she has not found relief of numerous longstanding symptoms. See first visit notes for details. Discussed CBT, Cymbalta last visit. And wanted to go over social hx more as did not have time last visit.  Since her last visit with me, she saw Dr. Max Sane rheumatology. She had repeat rheumatological workup that was negative.  She has seen Dr. Maricela Bo  For chiropractic care and Harlene Salts  for acupuncture.  She will often wake up with pain all over and Tylenol extra strength 1000 mg sometimes helps with this pain. She wonders about using Voltaren or other products for pain. She does not like to take medications. She is getting counseling and is setting up avenues for spiritual growth this fall. She is a [place] and has a strong support network. Of note, she reported today that at age 45 she had a very traumatic medical experience. She apparently was in the hospital for some time and had many needlesticks, many CT scans and surgery for an ovarian mass. This was very traumatizing for her , and she still gets anxiety when she is going to see a doctor or health care provider. She had a flashback to this time when she went for acupuncture recently.  ROS: See pertinent positives and negatives per HPI.  Past Medical History:  Diagnosis Date  . Adenomyosis    not papanicolaou smear of cervix and cervical HPV  . Anxiety 06/08/2016   -about her health and about taking medications  . Asthma   . Chronic fatigue 06/08/2016   -eval with rheum x2, neurology, gastroenterology  . Chronic pain 06/08/2016   -all over her  whole life; joints, muscles, head -numerous evaluations, Dr. Trudie Reed in 2017, No Name rheum -seeing Dr. Jaynee Eagles, Neurologist for back pain with radicular symptoms  . Eosinophilic esophagitis    Sees Dr. Carlean Purl  . Fibromyalgia   . GERD (gastroesophageal reflux disease)   . Iron deficiency anemia due to chronic blood loss    Menorrhagia, s/p complete hysterectomy, ovaries remain  . Irritable bowel syndrome 01/27/2013   Dr Carlean Purl 02/2016   . Microhematuria   . Nasal obstruction 06/26/2015   Seen by ENT 06-26-15.  May need sleep study to see if having apnea    . Palpitations   . SUI (stress urinary incontinence, female)   . Syncope    with last child with epideral    Past Surgical History:  Procedure Laterality Date  . APPENDECTOMY    . BALLOON DILATION N/A 02/09/2013   Procedure: BALLOON DILATION;  Surgeon: Arta Silence, MD;  Location: [facility];  Service: Endoscopy;  Laterality: N/A;  . BILATERAL SALPINGECTOMY N/A 06/14/2014   Procedure: BILATERAL SALPINGECTOMY;  Surgeon: Allena Katz, MD;  Location: [facility];  Service: Gynecology;  Laterality: N/A;  . COLONOSCOPY WITH PROPOFOL N/A 02/09/2013   Procedure: COLONOSCOPY WITH PROPOFOL;  Surgeon: Arta Silence, MD;  Location: [facility];  Service: Endoscopy;  Laterality: N/A;  need ultra thin colon scope  . ESOPHAGOGASTRODUODENOSCOPY (EGD) WITH PROPOFOL N/A 02/09/2013   Procedure: ESOPHAGOGASTRODUODENOSCOPY (EGD) WITH PROPOFOL;  Surgeon: Arta Silence, MD;  Location: [facility];  Service: Endoscopy;  Laterality: N/A;  . LAPAROSCOPIC ASSISTED VAGINAL HYSTERECTOMY Right 06/14/2014   Procedure: OPEN LAPAROSCOPIC ASSISTED VAGINAL HYSTERECTOMY;  Surgeon: Allena Katz, MD;  Location: [facility];  Service: Gynecology;  Laterality: Right;  . LYSIS OF ADHESION N/A 06/14/2014   Procedure: LYSIS OF ADHESION;  Surgeon: Allena Katz, MD;  Location: [facility];  Service: Gynecology;  Laterality: N/A;  . OVARIAN CYST REMOVAL      Family History  Problem  Relation Age of Onset  . Cancer Mother   . Cancer Maternal Grandfather   . Stroke Paternal Grandfather   . Kidney disease Paternal Grandfather   . CAD Father     MI at age 53  . Stroke Father     Social History   Social History  . Marital status: Married    Spouse name: [name]   . Number of children: 4  . Years of education: 12+   Social History Main Topics  . Smoking status: Never Smoker  . Smokeless tobacco: Never Used  . Alcohol use 0.0 oz/week     Comment: 3 glasses wine per week  . Drug use: No  . Sexual activity: Not Asked   Other Topics Concern  . None   Social History Narrative   Social History:   Updated: 04/2016   Now stays at home -  feels can barely function just to keep house and take care of  4 children. Husband travels and works a lot.   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      [name] - husband; [phone] -daughter; [name]- son [name] 2003 daughter [name] 2008 daughter    Healthy diet - lots of food allergies. Avoiding gluten currently.      Of note, at age 45 she had a very traumatic medical experience. She apparently was in the hospital for some time and had many needlesticks, many CT scans and surgery for an ovarian mass. This was very traumatizing for her. She still gets anxiety when she is going to see a doctor or health care provider. She had a flashback to this time when she went for acupuncture.     Current Outpatient Prescriptions:  .  acetaminophen (TYLENOL) 650 MG suppository, Place 650 mg rectally every 4 (four) hours as needed., Disp: , Rfl:  .  albuterol (PROVENTIL HFA;VENTOLIN HFA) 108 (90 BASE) MCG/ACT inhaler, Inhale 2 puffs into the lungs every 6 (six) hours as needed for wheezing., Disp: , Rfl:  .  diclofenac sodium (VOLTAREN) 1 % GEL, Apply 2 g topically 4 (four) times daily as needed., Disp: 100 g, Rfl: 1 .  docusate sodium (COLACE) 100  MG capsule, Take 200 mg by mouth daily as needed for moderate constipation. Reported on 03/12/2016, Disp: , Rfl:  .  fluticasone (FLONASE) 50 MCG/ACT nasal spray, Place 2 sprays into both nostrils daily., Disp: 16 g, Rfl: 6 .  naproxen sodium (ALEVE) 220 MG tablet, Take 220 mg by mouth 2 (two) times daily as needed., Disp: , Rfl:  .  OVER THE COUNTER MEDICATION, Take 1 capsule by mouth daily. Patient takes methylated folate., Disp: , Rfl:  .  pantoprazole (PROTONIX) 40 MG tablet, Take 1 tablet (40 mg total) by mouth daily., Disp: 90 tablet, Rfl: 3 .  vitamin B-12 (CYANOCOBALAMIN) 1000 MCG tablet, Take 1,000 mcg by mouth daily.,  Disp: , Rfl:   EXAM:  Vitals:   06/09/16 0841  BP: 100/64  Pulse: 84  Temp: 99.2 F (37.3 C)    Body mass index is 21.9 kg/m.  GENERAL: vitals reviewed and listed above, alert, oriented, appears well hydrated and in no acute distress  HEENT: atraumatic, conjunttiva clear, no obvious abnormalities on inspection of external nose and ears  NECK: no obvious masses on inspection  LUNGS: clear to auscultation bilaterally, no wheezes, rales or rhonchi, good air movement  CV: HRRR, no peripheral edema  MS: moves all extremities without noticeable abnormality  PSYCH: pleasant and cooperative, no obvious depression or anxiety  ASSESSMENT AND PLAN:  Discussed the following assessment and plan:  Chronic pain  Chronic fatigue  Gastroesophageal reflux disease with esophagitis  Palpitations  Anxiety  Multiple food allergies  - she may try alternating Tylenol, Voltaren and topical menthol products for her pain as needed - she will continue to seek care for anxiety, depressionfor spiritual growth - she is participating in gentle exercise on a regular basis -  She has opted to hold off on trying anything like Cymbalta for now, but she may consider this in the future -  Follow up yearly and as needed -Patient advised to return or notify a doctor immediately  if symptoms worsen or persist or new concerns arise.  There are no Patient Instructions on file for this visit.  Colin Benton R., DO

## 2016-06-09 ENCOUNTER — Ambulatory Visit (INDEPENDENT_AMBULATORY_CARE_PROVIDER_SITE_OTHER): Payer: 59 | Admitting: Family Medicine

## 2016-06-09 ENCOUNTER — Encounter: Payer: Self-pay | Admitting: Family Medicine

## 2016-06-09 DIAGNOSIS — G8929 Other chronic pain: Secondary | ICD-10-CM | POA: Diagnosis not present

## 2016-06-09 DIAGNOSIS — K2100 Gastro-esophageal reflux disease with esophagitis, without bleeding: Secondary | ICD-10-CM

## 2016-06-09 DIAGNOSIS — R5382 Chronic fatigue, unspecified: Secondary | ICD-10-CM | POA: Diagnosis not present

## 2016-06-09 DIAGNOSIS — R002 Palpitations: Secondary | ICD-10-CM

## 2016-06-09 DIAGNOSIS — F419 Anxiety disorder, unspecified: Secondary | ICD-10-CM

## 2016-06-09 DIAGNOSIS — Z91018 Allergy to other foods: Secondary | ICD-10-CM

## 2016-06-09 NOTE — Progress Notes (Signed)
Pre visit review using our clinic review tool, if applicable. No additional management support is needed unless otherwise documented below in the visit note. 

## 2016-06-19 ENCOUNTER — Telehealth (HOSPITAL_COMMUNITY): Payer: Self-pay

## 2016-06-19 NOTE — Telephone Encounter (Signed)
Encounter complete. 

## 2016-06-23 ENCOUNTER — Ambulatory Visit (HOSPITAL_COMMUNITY): Payer: 59 | Attending: Cardiovascular Disease

## 2016-06-23 ENCOUNTER — Other Ambulatory Visit: Payer: Self-pay

## 2016-06-23 ENCOUNTER — Ambulatory Visit (INDEPENDENT_AMBULATORY_CARE_PROVIDER_SITE_OTHER): Payer: 59 | Admitting: Cardiology

## 2016-06-23 ENCOUNTER — Encounter (HOSPITAL_COMMUNITY): Payer: Self-pay | Admitting: *Deleted

## 2016-06-23 VITALS — BP 102/72 | HR 76 | Ht 67.75 in | Wt 141.0 lb

## 2016-06-23 DIAGNOSIS — R55 Syncope and collapse: Secondary | ICD-10-CM | POA: Diagnosis not present

## 2016-06-23 DIAGNOSIS — I5189 Other ill-defined heart diseases: Secondary | ICD-10-CM

## 2016-06-23 DIAGNOSIS — R002 Palpitations: Secondary | ICD-10-CM | POA: Diagnosis not present

## 2016-06-23 DIAGNOSIS — D151 Benign neoplasm of heart: Secondary | ICD-10-CM | POA: Insufficient documentation

## 2016-06-23 DIAGNOSIS — R0600 Dyspnea, unspecified: Secondary | ICD-10-CM | POA: Diagnosis not present

## 2016-06-23 DIAGNOSIS — R222 Localized swelling, mass and lump, trunk: Secondary | ICD-10-CM | POA: Diagnosis not present

## 2016-06-23 DIAGNOSIS — I951 Orthostatic hypotension: Secondary | ICD-10-CM | POA: Diagnosis not present

## 2016-06-23 NOTE — Progress Notes (Signed)
Talked to Dr. Marlou Porch about echo and he seen Mrs. Ollie in office today
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
## 2016-06-23 NOTE — Patient Instructions (Addendum)
Medication Instructions:  The current medical regimen is effective;  continue present plan and medications.  You have been referred to the cardiothoracic surgeons. You should expect a call from them today for an appointment.   [phone]  Follow-Up: Follow up with Dr Stanford Breed in approximately 1 month.  Thank you for choosing [facility]!!    Atrial Myxoma Right Atrial Mass seen on echo

## 2016-06-24 ENCOUNTER — Inpatient Hospital Stay (HOSPITAL_COMMUNITY): Admission: RE | Admit: 2016-06-24 | Payer: 59 | Source: Ambulatory Visit

## 2016-06-24 ENCOUNTER — Encounter: Payer: 59 | Admitting: Thoracic Surgery (Cardiothoracic Vascular Surgery)

## 2016-06-26 ENCOUNTER — Ambulatory Visit (INDEPENDENT_AMBULATORY_CARE_PROVIDER_SITE_OTHER): Payer: 59 | Admitting: Internal Medicine

## 2016-06-26 ENCOUNTER — Encounter: Payer: Self-pay | Admitting: Internal Medicine

## 2016-06-26 VITALS — BP 108/70 | HR 88 | Ht 67.0 in | Wt 137.0 lb

## 2016-06-26 DIAGNOSIS — K200 Eosinophilic esophagitis: Secondary | ICD-10-CM

## 2016-06-26 DIAGNOSIS — G8929 Other chronic pain: Secondary | ICD-10-CM | POA: Diagnosis not present

## 2016-06-26 DIAGNOSIS — R1010 Upper abdominal pain, unspecified: Secondary | ICD-10-CM

## 2016-06-26 DIAGNOSIS — R1011 Right upper quadrant pain: Secondary | ICD-10-CM

## 2016-06-26 DIAGNOSIS — D151 Benign neoplasm of heart: Secondary | ICD-10-CM

## 2016-06-26 NOTE — Patient Instructions (Signed)
   Take your PPI as instructed per Dr Carlean Purl.     I appreciate the opportunity to care for you. Silvano Rusk, MD, [facility]

## 2016-06-26 NOTE — Progress Notes (Signed)
   Subjective:    Patient ID: Emily Joseph, female    DOB: [birth year], 45 y.o.   MRN: [id number] Cc: RUQ pain HPI  Still w/ similar sxs of RUQ pain. Mild rare dysphagia - has been reluctant to take PPI - did eventually after getting new PCP who agreed with me - has used bid OTC Prilosec Went to see cardiology at my suggestion - ? Of possible dysautonomia  - had Echo - 4 cm R atrial myxoma discovered Due to see Dr. Roxy Manns soon re: Tx  Medications, allergies, past medical history, past surgical history, family history and social history are reviewed and updated in the EMR.  Review of Systems As above    Objective:   Physical Exam BP 108/70   Pulse 88   Ht 5\' 7"  (1.702 m)   Wt 137 lb (62.1 kg)   LMP 05/20/2014   BMI 21.46 kg/m  NAD     Assessment & Plan:   Encounter Diagnoses  Name Primary?  . Eosinophilic esophagitis Yes  . Chronic RUQ pain   . Atrial myxoma      She will restart PPI regularly She could need TEE and I have some concern about passage of the scope and possible difficulty there.  Will be curious to see what of her many sxs improve after treatment of R atrial myxoma.  15 minutes time spent with patient > half in counseling coordination of care  I appreciate the opportunity to care for this patient. Cc:KIM, Nickola Major., DO Kirk Ruths, MD Lilly Cove, MD

## 2016-06-28 ENCOUNTER — Encounter: Payer: Self-pay | Admitting: Internal Medicine

## 2016-06-28 DIAGNOSIS — D151 Benign neoplasm of heart: Secondary | ICD-10-CM | POA: Insufficient documentation

## 2016-06-30 ENCOUNTER — Encounter: Payer: Self-pay | Admitting: Family Medicine

## 2016-07-01 ENCOUNTER — Encounter: Payer: 59 | Admitting: Thoracic Surgery (Cardiothoracic Vascular Surgery)

## 2016-07-01 ENCOUNTER — Telehealth: Payer: Self-pay | Admitting: Cardiology

## 2016-07-01 NOTE — Telephone Encounter (Signed)
New Message:    Pt needs heart surgery,she wants a referral for [facility]. She wants it asap.please,says she feeling a little dizzy also.Marland Kitchen

## 2016-07-01 NOTE — Telephone Encounter (Signed)
Spoke with pt, she wanted to know who in the area besides [facility] does the procedure she is going to have. She wanted to know who at [facility] would be the best person to see if she had a problem and dr Roxy Manns was no available. She is concerned that her appt has been pushed out to Friday this week. Reassurance given to the pt that dr Roxy Manns has our records and if they felt she was not able to be pushed out then they would have seen her sooner. Patient voiced understanding and is going to see dr Roxy Manns on Friday.

## 2016-07-01 NOTE — Telephone Encounter (Signed)
Would review with pt; if she wants procedure at [facility], can touch base with Dr Evelina Dun but doubt it would be this week. Otherwise would see Dr Roxy Manns as scheduled. Kirk Ruths

## 2016-07-01 NOTE — Telephone Encounter (Signed)
Spoke with Emily Joseph, she wants to see someone at [facility] for her atrial mass/myxoma. She has an appointment with dr Roxy Manns on Friday. She is also c/o dizziness when she turns her head. She feels this is related to the heart.  Will forward for dr Stanford Breed review and let me know who at [facility] the Emily Joseph would need to see for this surgery.

## 2016-07-02 ENCOUNTER — Other Ambulatory Visit: Payer: Self-pay | Admitting: *Deleted

## 2016-07-02 DIAGNOSIS — D151 Benign neoplasm of heart: Secondary | ICD-10-CM

## 2016-07-02 DIAGNOSIS — K219 Gastro-esophageal reflux disease without esophagitis: Secondary | ICD-10-CM

## 2016-07-03 ENCOUNTER — Encounter: Payer: Self-pay | Admitting: Thoracic Surgery (Cardiothoracic Vascular Surgery)

## 2016-07-03 ENCOUNTER — Telehealth: Payer: Self-pay

## 2016-07-03 DIAGNOSIS — T50995A Adverse effect of other drugs, medicaments and biological substances, initial encounter: Secondary | ICD-10-CM

## 2016-07-03 DIAGNOSIS — I5189 Other ill-defined heart diseases: Secondary | ICD-10-CM | POA: Insufficient documentation

## 2016-07-03 MED ORDER — PREDNISONE 20 MG PO TABS: ORAL_TABLET | ORAL | 0 refills | Status: DC

## 2016-07-03 NOTE — Telephone Encounter (Signed)
Prednisone 60 mg po evening before scan and Prednisone 60 mg morning of scan.with Benadryl 50 mg, Patient is aware.

## 2016-07-04 ENCOUNTER — Encounter: Payer: Self-pay | Admitting: Thoracic Surgery (Cardiothoracic Vascular Surgery)

## 2016-07-04 ENCOUNTER — Other Ambulatory Visit: Payer: Self-pay | Admitting: *Deleted

## 2016-07-04 ENCOUNTER — Ambulatory Visit (HOSPITAL_COMMUNITY)
Admission: RE | Admit: 2016-07-04 | Discharge: 2016-07-04 | Disposition: A | Discharge status: Home / Self Care | Payer: 59 | Source: Ambulatory Visit | Attending: Thoracic Surgery (Cardiothoracic Vascular Surgery) | Admitting: Thoracic Surgery (Cardiothoracic Vascular Surgery)

## 2016-07-04 ENCOUNTER — Institutional Professional Consult (permissible substitution) (INDEPENDENT_AMBULATORY_CARE_PROVIDER_SITE_OTHER): Payer: 59 | Admitting: Thoracic Surgery (Cardiothoracic Vascular Surgery)

## 2016-07-04 VITALS — BP 107/71 | HR 92 | Resp 20 | Ht 67.0 in | Wt 137.0 lb

## 2016-07-04 DIAGNOSIS — T50995A Adverse effect of other drugs, medicaments and biological substances, initial encounter: Secondary | ICD-10-CM | POA: Diagnosis not present

## 2016-07-04 DIAGNOSIS — K219 Gastro-esophageal reflux disease without esophagitis: Secondary | ICD-10-CM | POA: Diagnosis not present

## 2016-07-04 DIAGNOSIS — D151 Benign neoplasm of heart: Secondary | ICD-10-CM | POA: Diagnosis present

## 2016-07-04 DIAGNOSIS — R222 Localized swelling, mass and lump, trunk: Secondary | ICD-10-CM

## 2016-07-04 DIAGNOSIS — I5189 Other ill-defined heart diseases: Secondary | ICD-10-CM

## 2016-07-04 DIAGNOSIS — K224 Dyskinesia of esophagus: Secondary | ICD-10-CM | POA: Insufficient documentation

## 2016-07-04 DIAGNOSIS — D219 Benign neoplasm of connective and other soft tissue, unspecified: Secondary | ICD-10-CM
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
## 2016-07-04 NOTE — Patient Instructions (Addendum)
Continue all previous medications without any changes at this time  Take Prednisone as directed prior to CT angiogram  Continue taking all other medications without change through the day before surgery.  Have nothing to eat or drink after midnight the night before surgery.

## 2016-07-07 ENCOUNTER — Ambulatory Visit (HOSPITAL_COMMUNITY)
Admission: RE | Admit: 2016-07-07 | Discharge: 2016-07-07 | Disposition: A | Discharge status: Home / Self Care | Payer: 59 | Source: Ambulatory Visit | Attending: Thoracic Surgery (Cardiothoracic Vascular Surgery) | Admitting: Thoracic Surgery (Cardiothoracic Vascular Surgery)

## 2016-07-07 ENCOUNTER — Encounter (HOSPITAL_COMMUNITY): Payer: Self-pay

## 2016-07-07 DIAGNOSIS — I519 Heart disease, unspecified: Secondary | ICD-10-CM | POA: Diagnosis not present

## 2016-07-07 DIAGNOSIS — D151 Benign neoplasm of heart: Secondary | ICD-10-CM | POA: Insufficient documentation

## 2016-07-07 DIAGNOSIS — K219 Gastro-esophageal reflux disease without esophagitis: Secondary | ICD-10-CM

## 2016-07-07 MED ORDER — NITROGLYCERIN 0.4 MG SL SUBL
SUBLINGUAL_TABLET | SUBLINGUAL | Status: AC
  Administered 2016-07-07: 0.4 mg via SUBLINGUAL
  Filled 2016-07-07: qty 1

## 2016-07-07 MED ORDER — NITROGLYCERIN 0.4 MG SL SUBL
0.4000 mg | SUBLINGUAL_TABLET | SUBLINGUAL | Status: DC | PRN
  Administered 2016-07-07: 0.4 mg via SUBLINGUAL

## 2016-07-07 MED ORDER — IOPAMIDOL (ISOVUE-370) INJECTION 76%
INTRAVENOUS | Status: AC
  Administered 2016-07-07: 75 mL
  Filled 2016-07-07: qty 50

## 2016-07-07 MED ORDER — METOPROLOL TARTRATE 5 MG/5ML IV SOLN
5.0000 mg | INTRAVENOUS | Status: DC | PRN
  Administered 2016-07-07 (×3): 5 mg via INTRAVENOUS

## 2016-07-07 MED ORDER — METOPROLOL TARTRATE 5 MG/5ML IV SOLN
INTRAVENOUS | Status: AC
  Administered 2016-07-07: 5 mg via INTRAVENOUS
  Filled 2016-07-07: qty 5

## 2016-07-07 MED ORDER — IOPAMIDOL (ISOVUE-370) INJECTION 76%
INTRAVENOUS | Status: AC
  Administered 2016-07-07: 75 mL
  Filled 2016-07-07: qty 100

## 2016-07-07 MED ORDER — METOPROLOL TARTRATE 5 MG/5ML IV SOLN
INTRAVENOUS | Status: AC
  Administered 2016-07-07: 5 mg via INTRAVENOUS
  Filled 2016-07-07: qty 15

## 2016-07-07 NOTE — H&P (Signed)
[address]       [place] [address]             [phone]          CARDIOTHORACIC SURGERY HISTORY AND PHYSICAL EXAM  Referring Provider is Stanford Breed, Denice Bors, MD PCP is Lucretia Kern., DO      Chief Complaint  Patient presents with  . Atrial Mass    Surgical eval, ECHO 06/23/2016    HPI:  Patient is a 45 year old female with history of iron deficient anemia, eosinophilic esophagitis, and fibromyalgia who was recently referred to Dr. Stanford Breed for cardiology consultation because of a long progressive history of exertional shortness of breath, fatigue, palpitations and atypical chest discomfort.  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 The patient was referred for surgical consultation.  The patient is married and lives locally in [place] with her husband. She has 4 children, 3 of whom are teenagers. 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 She describes a gradual progression of symptoms of decreased energy, fatigue, and exertional shortness of breath that dates back several years. Symptoms have continued to progress. She describes atypical chest pains that radiate across her chest and sometimes occur with activity and other times occur at rest. She recently has had problems with shortness of breath lying flat in bed, increased palpitations, dizzy spells suggestive of orthostatic hypotension. She has a variety of other atypical complaints including migratory arthritis, anxiety, transient blurry vision.  In the past she was or active physically but more recently she has gotten to the point where sees simply has no energy and she gets short of breath and fatigue with very mild activity.  She has had some mild swelling of her right arm but no swelling in the left arm. She  denies any history of lower extremity edema. She has not had bloating of her abdomen but she reports vague abdominal discomfort and chronic constipation.    Past Medical History:  Diagnosis Date  . Adenomyosis    not papanicolaou smear of cervix and cervical HPV  . Anxiety 06/08/2016   -about her health and about taking medications  . Asthma   . 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 Chronic fatigue 06/08/2016   -eval with rheum x2, neurology, gastroenterology  . Chronic pain 06/08/2016   -all over her whole life; joints, muscles, head -numerous evaluations, Dr. Trudie Reed in 2017, [place] [facility] -seeing Dr. Jaynee Eagles, Neurologist for back pain with radicular symptoms  . Eosinophilic esophagitis    Sees Dr. Carlean Purl  . Fibromyalgia   . GERD (gastroesophageal reflux disease)   . Iron deficiency anemia due to chronic blood loss    Menorrhagia, s/p complete hysterectomy, ovaries remain  . Irritable bowel syndrome 01/27/2013   Dr Carlean Purl 02/2016   . Microhematuria   . Nasal obstruction 06/26/2015   Seen by ENT 06-26-15.  May need sleep study to see if having apnea    . Palpitations   . SUI (stress urinary incontinence, female)   . Syncope    with last child with epideral    Past Surgical History:  Procedure Laterality Date  . APPENDECTOMY    . BALLOON DILATION N/A 02/09/2013   Procedure: BALLOON DILATION;  Surgeon:  Arta Silence, MD;  Location: [facility];  Service: Endoscopy;  Laterality: N/A;  . BILATERAL SALPINGECTOMY N/A 06/14/2014   Procedure: BILATERAL SALPINGECTOMY;  Surgeon: Allena Katz, MD;  Location: [facility];  Service: Gynecology;  Laterality: N/A;  . COLONOSCOPY WITH PROPOFOL N/A 02/09/2013   Procedure: COLONOSCOPY WITH PROPOFOL;  Surgeon: Arta Silence, MD;  Location: [facility];  Service: Endoscopy;  Laterality: N/A;  need ultra thin colon scope  . ESOPHAGOGASTRODUODENOSCOPY (EGD) WITH PROPOFOL N/A 02/09/2013   Procedure: ESOPHAGOGASTRODUODENOSCOPY  (EGD) WITH PROPOFOL;  Surgeon: Arta Silence, MD;  Location: [facility];  Service: Endoscopy;  Laterality: N/A;  . LAPAROSCOPIC ASSISTED VAGINAL HYSTERECTOMY Right 06/14/2014   Procedure: OPEN LAPAROSCOPIC ASSISTED VAGINAL HYSTERECTOMY;  Surgeon: Allena Katz, MD;  Location: [facility];  Service: Gynecology;  Laterality: Right;  . LYSIS OF ADHESION N/A 06/14/2014   Procedure: LYSIS OF ADHESION;  Surgeon: Allena Katz, MD;  Location: [facility];  Service: Gynecology;  Laterality: N/A;  . OVARIAN CYST REMOVAL      Family History  Problem Relation Age of Onset  . Cancer Mother   . CAD Father     MI at age 36  . Stroke Father   . Cancer Maternal Grandfather   . Stroke Paternal Grandfather   . Kidney disease Paternal Grandfather     Social History Social History  Substance Use Topics  . Smoking status: Never Smoker  . Smokeless tobacco: Never Used  . Alcohol use 0.0 oz/week     Comment: 3 glasses wine per week    Prior to Admission medications   Medication Sig Start Date End Date Taking? Authorizing Provider  acetaminophen (TYLENOL) 650 MG CR tablet Take 650 mg by mouth daily as needed for pain.   Yes Historical Provider, MD  albuterol (PROVENTIL HFA;VENTOLIN HFA) 108 (90 BASE) MCG/ACT inhaler Inhale 2 puffs into the lungs every 6 (six) hours as needed for wheezing.   Yes Historical Provider, MD  diclofenac sodium (VOLTAREN) 1 % GEL Apply 2 g topically 4 (four) times daily as needed. 03/12/16  Yes Lind Covert, MD  docusate sodium (COLACE) 100 MG capsule Take 100 mg by mouth daily as needed for moderate constipation. Reported on 03/12/2016   Yes Historical Provider, MD  fluticasone (FLONASE) 50 MCG/ACT nasal spray Place 2 sprays into both nostrils daily. 08/31/14  Yes Timmothy Euler, MD  omeprazole (PRILOSEC) 20 MG capsule Take 20 mg by mouth 2 (two) times daily before a meal.   Yes Historical Provider, MD  OVER THE COUNTER MEDICATION Take 1 capsule by mouth daily. Patient  takes methylated folate.   Yes Historical Provider, MD  polyethylene glycol (MIRALAX / GLYCOLAX) packet Take 17 g by mouth daily as needed for mild constipation.   Yes Historical Provider, MD  predniSONE (DELTASONE) 20 MG tablet 60 mg po evening prior to CT scan and 60 mg po morning of CT scan, along with Benadryl 50 mg morning of CT Scan 07/03/16  Yes Rexene Alberts, MD  vitamin B-12 (CYANOCOBALAMIN) 1000 MCG tablet Take 1,000 mcg by mouth daily.   Yes Historical Provider, MD    Allergies  Allergen Reactions  . Avocado Shortness Of Breath and Nausea And Vomiting  . Other Other (See Comments)    Patient is allergic to garden peas. Tested positive in allergy testing. Patient states squash causes GI problems. Squash - passed out  . Peanuts [Peanut Oil] Other (See Comments)    Tested positive on allergy test.  .  Ciprofloxacin Other (See Comments)    Severe joint pain  . Demerol [Meperidine] Other (See Comments)    hallucinate   . Latex Swelling  . Macadamia Nut Oil Hives and Swelling    lips swell  . Macrobid [Nitrofurantoin]     Chest pain  . Mango Flavor Swelling    Mango causes swelling of the lips.  . Sulfamethoxazole Other (See Comments)    Upset stomach  . Iodinated Diagnostic Agents Hives    Patient had IVP @ age 29 and broke out in Hives     Review of Systems:                        General:                                          decreased appetite, decreased energy, no weight gain, + weight loss, no fever                       Cardiac:                                           + chest pain with exertion, + chest pain at rest, +SOB with exertion, + resting SOB, no PND, + orthopnea, + palpitations, no arrhythmia, no atrial fibrillation, no LE edema, + dizzy spells, no syncope                       Respiratory:                                     + shortness of breath, no home oxygen, no productive cough, no dry cough, no bronchitis, no wheezing, no hemoptysis, + asthma,  no pain with inspiration or cough, no sleep apnea, no CPAP at night                       GI:                                                             mild difficulty swallowing, no reflux, no frequent heartburn, no hiatal hernia, + abdominal pain, + chronic constipation, no diarrhea, no hematochezia, no hematemesis, no melena                       GU:                                                            no dysuria,  no frequency, no urinary tract infection, + hematuria, no kidney stones, no kidney disease  Vascular:                                         no pain suggestive of claudication, no pain in feet, + occasional leg cramps, no varicose veins, no DVT, no non-healing foot ulcer                       Neuro:                                                       no stroke, no TIA's, no seizures, + headaches, no temporary blindness one eye,  no slurred speech, no peripheral neuropathy, no chronic pain, no instability of gait, no memory/cognitive dysfunction                       Musculoskeletal:                   + arthritis, + joint swelling, + myalgias, no difficulty walking, normal mobility                        Skin:                                                          no rash, no itching, no skin infections, no pressure sores or ulcerations                       Psych:                                                       + anxiety, no depression, + nervousness, + unusual recent stress                       Eyes:                                                         + blurry vision, + floaters, no recent vision changes, + wears glasses or contacts                       ENT:                                                          no hearing loss, no loose or painful teeth, no dentures, last saw dentist within the past year  Hematologic:                                   no easy bruising, no abnormal bleeding, no clotting disorder, no  frequent epistaxis                       Endocrine:                                       no diabetes, does not check CBG's at home                                               Physical Exam:                        BP 107/71 (BP Location: Left Arm, Patient Position: Sitting, Cuff Size: Small)   Pulse 92   Resp 20   Ht 5\' 7"  (1.702 m)   Wt 137 lb (62.1 kg)   LMP 05/20/2014   SpO2 99% Comment: RA  BMI 21.46 kg/m                        General:                                            well-appearing                       HEENT:                                           Unremarkable                        Neck:                                                         no JVD, no bruits, no adenopathy                        Chest:                                                        clear to auscultation, symmetrical breath sounds, no wheezes, no rhonchi                        CV:  RRR, no  murmur                        Abdomen:                                        soft, non-tender, no masses                        Extremities:                                     warm, well-perfused, pulses palpable, no LE edema                       Rectal/GU                                       Deferred                       Neuro:                                                       Grossly non-focal and symmetrical throughout                       Skin:                                                          Clean and dry, no rashes, no breakdown   Diagnostic Tests:  Transthoracic Echocardiography  Patient: Emily Joseph, Tuszynski MR #: [id number] Study Date: 06/23/2016 Gender: F Age: 10 Height: 170.2 cm Weight: 63.4 kg BSA: 1.73 m^2 Pt. Status: Room:  ATTENDING Mertie Moores, M.D. Haworth Crenshaw SONOGRAPHER Marygrace Drought, RCS PERFORMING  [facility], Outpatient  cc:  ------------------------------------------------------------------- LV EF: 50% - 55%  ------------------------------------------------------------------- Indications: SOB (R06.00).  ------------------------------------------------------------------- History: PMH: Syncope, Palpitations.  ------------------------------------------------------------------- Study Conclusions  - Left ventricle: The cavity size was normal. Wall thickness was normal. Systolic function was normal. The estimated ejection fraction was in the range of 50% to 55%.  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 This myxoma likely impairs the flow across the tricuspid valve.  ------------------------------------------------------------------- Study data: No prior study was available for comparison. Study status: Routine. Procedure: The patient reported no pain pre or post test. Transthoracic echocardiography. Image quality was adequate. Transthoracic echocardiography. M-mode, complete 2D, spectral Doppler, and color Doppler. Birthdate: Patient birthdate: 1970/11/28. Age: Patient is 45 yr old. Sex: Gender: female. BMI: 21.9 kg/m^2. Blood pressure: 116/72 Patient status: Outpatient. Study date: Study date: 06/23/2016. Study time: 09:30 AM. Location: [facility]  -------------------------------------------------------------------  ------------------------------------------------------------------- Left ventricle: The cavity size was normal. Wall thickness was normal. Systolic function was normal. The estimated ejection fraction was in the range of  50% to 55%.  ------------------------------------------------------------------- Aortic valve: Structurally normal valve. Cusp separation was normal. Doppler: Transvalvular velocity was within the normal range. There was no stenosis. There was no  regurgitation.  ------------------------------------------------------------------- Aorta: Aortic root: The aortic root was normal in size. Ascending aorta: The ascending aorta was normal in size.  ------------------------------------------------------------------- Mitral valve: Structurally normal valve. Leaflet separation was normal. Doppler: Transvalvular velocity was within the normal range. There was no evidence for stenosis. There was no regurgitation. Peak gradient (D): 3 mm Hg.  ------------------------------------------------------------------- Right ventricle: The cavity size was normal. Systolic function was normal.  ------------------------------------------------------------------- Pulmonic valve: The valve appears to be grossly normal. Doppler: There was no significant regurgitation.  ------------------------------------------------------------------- Right atrium: There is a large myxoma in the right atrium ( 3.9cm x 4.2 cm ). It can be visualized crossing the tricuspid valve during diastole.  ------------------------------------------------------------------- Pericardium: There was no pericardial effusion.  ------------------------------------------------------------------- Systemic veins: Inferior vena cava: The vessel was normal in size. The respirophasic diameter changes were in the normal range (= 50%), consistent with normal central venous pressure. Diameter: 16 mm.  ------------------------------------------------------------------- Measurements  IVC Value Reference ID 16 mm ---------  Left ventricle Value Reference LV ID, ED, PLAX chordal (L) 42.1 mm 43 - 52 LV ID, ES, PLAX chordal 27.9 mm 23 - 38 LV fx shortening, PLAX chordal 34 % >=29 LV  PW thickness, ED 9.39 mm --------- IVS/LV PW ratio, ED 0.56 <=1.3 Stroke volume, 2D 54 ml --------- Stroke volume/bsa, 2D 31 ml/m^2 --------- LV ejection fraction, 1-p A4C 58 % --------- LV e&', lateral 12.4 cm/s --------- LV E/e&', lateral 6.85 --------- LV e&', medial 11 cm/s --------- LV E/e&', medial 7.72 --------- LV e&', average 11.7 cm/s --------- LV E/e&', average 7.26 ---------  Ventricular septum Value Reference IVS thickness, ED 5.22 mm ---------  LVOT Value Reference LVOT ID, S 20 mm --------- LVOT area 3.14 cm^2 --------- LVOT peak velocity, S 94.6 cm/s --------- LVOT mean velocity, S 51.7 cm/s --------- LVOT VTI, S 17.2 cm ---------  Aorta Value Reference Aortic root ID, ED 25 mm ---------  Left atrium Value Reference LA ID, A-P, ES 28 mm --------- LA ID/bsa, A-P 1.62 cm/m^2 <=2.2 LA volume, S 31.4 ml --------- LA volume/bsa, S 18.1 ml/m^2 --------- LA volume, ES, 1-p A4C 31.9 ml --------- LA volume/bsa, ES, 1-p A4C 18.4 ml/m^2 --------- LA volume, ES, 1-p A2C 29.1 ml --------- LA volume/bsa, ES, 1-p A2C  16.8 ml/m^2 ---------  Mitral valve Value Reference Mitral E-wave peak velocity 84.9 cm/s --------- Mitral A-wave peak velocity 58.7 cm/s --------- Mitral deceleration time 208 ms 150 - 230 Mitral peak gradient, D 3 mm Hg --------- Mitral E/A ratio, peak 1.4 ---------  Right ventricle Value Reference TAPSE 25.8 mm --------- RV s&', lateral, S 16.6 cm/s ---------  Legend: (L) and (H) mark values outside specified reference range.  ------------------------------------------------------------------- Prepared and Electronically Authenticated by  Mertie Moores, M.D. 2017-08-14T11:11:38   ESOPHOGRAM / BARIUM SWALLOW / BARIUM TABLET STUDY  TECHNIQUE: Combined double contrast and single contrast examination performed using effervescent crystals, thick barium liquid, and thin barium liquid. The patient was observed with fluoroscopy swallowing a 13 mm barium sulphate tablet.  FLUOROSCOPY TIME: Radiation Exposure Index (as provided by the fluoroscopic device): 77 microGy*m^2  COMPARISON: None.  FINDINGS: The pharyngeal phase of swallowing appears normal. No significant esophageal mucosal abnormality is observed nor is there an esophageal stricture. No significant distal esophageal mucosal fold thickening.  Primary peristaltic waves in the esophagus were disrupted in the mid  to upper esophagus in 2 out of 4 swallows.  A 13 mm barium tablet passed promptly to the stomach.  IMPRESSION: 1. Nonspecific esophageal dysmotility disorder, with disruption of primary peristaltic waves in the mid esophagus on 2 out of 4 swallows. 2. Otherwise normal exam.   Electronically Signed By: Van Clines  M.D. On: 07/04/2016 10:39    Cardiac/Coronary  CT  TECHNIQUE: The patient was scanned on a Philips 256 scanner.  FINDINGS: A 120 kV prospective scan was triggered in the descending thoracic aorta at 111 HU's. Axial non-contrast 3 mm slices were carried out through the heart. The data set was analyzed on a dedicated work station and scored using the [place]. Gantry rotation speed was 270 msecs and collimation was .9 mm. 20 mg of iv Metoprolol and 0.4 mg of sl NTG was given. The 3D data set was reconstructed in 5% intervals of the 67-82 % of the R-R cycle. Diastolic phases were analyzed on a dedicated work station using MPR, MIP and VRT modes. The patient received 80 cc of contrast.  Aorta: Normal size, no calcifications, no dissection. Ascending aorta measuring 23 x 23 mm.  Aortic Valve: Trileflet with no calcifications, normal size of the aortic root measuring 28 x 27 x 25 mm.  Coronary Arteries:  Normal coronary origin with right dominance.  RCA is a large caliber artery that gives rise to a large PDA and PLVB. There is no plaque.  Left main is a large artery that gives rise to LAD and LCX arteries and has no plaque.  LAD is a large artery that gives rise to two diagonal branches. There is no plaque.  LCX is a medium size non-dominant vessel that gives rise to one obtuse marginal branch. There is no plaque.  Right atrial mass: There is a large right atrial mass measuring 42 mm in the right to left direction, 41 mm in the anteroposterior direction and 39 mm in the supero-inferior direction. The mass originates in the lateral wall of the right atrium with a wide base measuring 18 mm. Minimal to none residual atrial wall is visible. There is no obvious penetration into the pericardial space and no pericardial effusion is seen. The mass is well circumscribed and fairly homogenous with high fat content (mean HU -94). No other intracardiac masses are  seen.  Normal size of the pulmonary artery.  Normal pulmonary vein drainage into the left atrium.  No ASD/VSD seen.  Normal left atrial appendage with no evidence of filling defect.  IMPRESSION: 1. Coronary calcium score of 0. This was 0 percentile for age and sex matched control.  2. Coronary arteries: Normal coronary origin with right dominance. There is no evidence for CAD.  3. Right atrial mass: There is a large right atrial mass measuring 42 mm in the right to left direction, 41 mm in the anteroposterior direction and 39 mm in the supero-inferior direction. The mass originates in the lateral wall of the right atrium with a wide base measuring 18 mm. Minimal to none residual atrial wall is visible. There is no obvious penetration into the pericardial space and no pericardial effusion is seen. The mass is well circumscribed and fairly homogenous with high fat content (mean HU -94). No other intracardiac masses are seen.  The differential includes lipoma and myxoma. It is unlikely that this is a malignant mass.  Ena Dawley   Electronically Signed   By: Ena Dawley   On: 07/07/2016 18:13   CT ANGIOGRAPHY CHEST,  ABDOMEN AND PELVIS  TECHNIQUE: Multidetector CT imaging through the chest, abdomen and pelvis was performed using the standard protocol during bolus administration of intravenous contrast. Multiplanar reconstructed images and MIPs were obtained and reviewed to evaluate the vascular anatomy.  CONTRAST:  75 mL of Isovue 370.  COMPARISON:  CT the abdomen and pelvis 10/25/2012.  FINDINGS: CTA CHEST FINDINGS  Cardiovascular: Heart size is normal. Large filling defect in the right atrium, corresponding to the reported atrial mass. Notably, this lesion is very low-attenuation (-100 HU), suggesting an atrial lipoma rather than myxoma. This lesion is predominantly in the inferior aspect of the right atrium and is in direct contact  with the portion of the tricuspid valve. The anterior border of the lesion also comes in close proximity to the descending portion of the right coronary artery. There is no significant pericardial fluid, thickening or pericardial calcification.  Mediastinum/Nodes: No pathologically enlarged mediastinal or hilar lymph nodes. Esophagus is unremarkable in appearance. No axillary lymphadenopathy.  Lungs/Pleura: No acute consolidative airspace disease. No pleural effusions. No suspicious appearing pulmonary nodules or masses.  Musculoskeletal: There are no aggressive appearing lytic or blastic lesions noted in the visualized portions of the skeleton.  Review of the MIP images confirms the above findings.  CTA ABDOMEN AND PELVIS FINDINGS  Hepatobiliary: Well-defined low-attenuation lesion measuring 2.0 cm in segment 6/7 of the liver is compatible with a simple cyst. Sub cm low-attenuation lesion in the inferior aspect of segment 6 of the liver and inferior aspect of segment 5 of liver both too small to definitively characterize, but also favored to represent tiny cysts. No aggressive hepatic lesions are noted. No intra or extrahepatic biliary ductal dilatation. Gallbladder is normal in appearance.  Pancreas: No pancreatic mass. No pancreatic ductal dilatation. No pancreatic or peripancreatic fluid or inflammatory changes.  Spleen: Unremarkable.  Adrenals/Urinary Tract: No suspicious renal lesions. No hydroureteronephrosis. Bilateral adrenal glands are normal in appearance. Urinary bladder is normal in appearance.  Stomach/Bowel: The appearance of the stomach is normal. There is no pathologic dilatation of small bowel or colon. There is a large amount of high density barium in the distal colon and rectum, presumably related to recent swallow study performed on 07/04/2016. This results in extensive beam hardening artifact which impairs visualization of significant portions  of the mid to low anatomic pelvis. The appendix is not confidently identified and may be surgically absent. Regardless, there are no inflammatory changes noted adjacent to the cecum to suggest the presence of an acute appendicitis at this time.  Vascular/Lymphatic: No significant atherosclerotic disease, aneurysm or dissection identified in the abdominal or pelvic vasculature. Single right renal artery and 2 left-sided renal arteries noted. Celiac axis, superior mesenteric artery and inferior mesenteric artery are all widely patent without hemodynamically significant stenosis. No lymphadenopathy noted in the abdomen or pelvis.  Reproductive: Uterus is not confidently visualized and may be surgically absent (although this region of the pelvis is significantly obscured by beam hardening artifact). Ovaries are also not visualized and may be surgically absent, or obscured.  Other: No significant volume of ascites.  No pneumoperitoneum.  Musculoskeletal: There are no aggressive appearing lytic or blastic lesions noted in the visualized portions of the skeleton.  Review of the MIP images confirms the above findings.  IMPRESSION: 1. 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 2. No acute findings in the chest, abdomen or pelvis. 3. Vasculature throughout the chest, abdomen  and pelvis is normal in appearance, without significant atherosclerotic disease, aneurysm or dissection.   Electronically Signed   By: Vinnie Langton M.D.   On: 07/07/2016 14:30   Impression:  I have personally reviewed the patient's recent transthoracic echocardiogram. 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  Findings are not suggestive of papillary fibro-elastoma or malignancy. Although theoretically possible, Echo findings are not  suggestive of a large clot consistent with pulmonary embolus in transit. The mass partially obstructs the tricuspid orifice and moves in and out across the tricuspid valve during the cardiac cycle. The mass does not appear to emanate from the intra-atrial septum. It does not appear to be attached to the tricuspid valve or tricuspid annulus. The precise attachment of the mass within the atrium is not clearly identified. The right ventricle is underfilled. Left ventricle and left atrium appear normal. No other masses are identified. There is no significant valvular disease. There is no sign of patent foramen ovale or other intra-atrial communication. The patient needs surgical resection. She would likely be relatively good candidate for minimally invasive approach.   Plan:  I have discussed the nature of the presumed diagnosis of atrial myxoma at length with the patient and her husband in the office today. Differential diagnosis including other benign and malignant tumors have been discussed.  The patient understands the need for surgical resection. Alternative surgical approaches of been discussed including a comparison between conventional median sternotomy and minimally invasive approach. Because the patient is relatively young and has no risk factors for the presence of coronary artery disease, I do not feel that diagnostic cardiac catheterization is necessary. As an alternative, we will plan cardiac gated CT angiogram of the heart to evaluate the patient's coronary circulation and hopefully get a better idea where the mass is attached within the right atrium.  She will also undergo CT angiogram of the aorta and iliac vessels to evaluate the feasibility of peripheral arterial cannulation for surgery. We tentatively plan to proceed with minimally invasive resection of right atrial mass on Friday, 07/11/2016.  The patient and her husband understand and accept all potential associated risks of surgery  including but not limited to risk of death, stroke, myocardial infarction, congestive heart failure, respiratory failure, renal failure, bleeding requiring blood transfusion and/or reexploration, arrhythmia, heart block or bradycardia requiring permanent pacemaker, pneumonia, pleural effusion, wound infection, pulmonary embolus or other thromboembolic complication, chronic pain or other delayed complications.  Specific risks potentially related to the minimally-invasive approach were discussed at length, including but not limited to risk of conversion to full or partial sternotomy, aortic dissection or other major vascular complication, unilateral acute lung injury or pulmonary edema, phrenic nerve dysfunction or paralysis, rib fracture, chronic pain, lung hernia, or lymphocele.   The small but significant risk of late recurrence of atrial myxoma is been discussed. Specific risks related to intraoperative findings regarding the need for complete surgical resection of been discussed including the possibility of development of complete heart block and/or tricuspid regurgitation. The possibility of perioperative atrial dysrhythmias including atrial fibrillation and atrial flutter have been discussed.  Small but significant risk of need for conversion to conventional sternotomy has been discussed.  All questions have been answered.   I spent in excess of 90 minutes during the conduct of this office consultation and >50% of this time involved direct face-to-face encounter with the patient for counseling and/or coordination of their care.  Valentina Gu. Roxy Manns, MD 07/04/2016 10:54 AM

## 2016-07-08 ENCOUNTER — Encounter: Payer: Self-pay | Admitting: Thoracic Surgery (Cardiothoracic Vascular Surgery)

## 2016-07-08 ENCOUNTER — Telehealth: Payer: Self-pay | Admitting: Thoracic Surgery (Cardiothoracic Vascular Surgery)

## 2016-07-08 NOTE — Anesthesia Preprocedure Evaluation (Addendum)
Anesthesia Evaluation  Patient identified by MRN, date of birth, ID band Patient awake    Reviewed: Allergy & Precautions, H&P , NPO status , Patient's Chart, lab work & pertinent test results  History of Anesthesia Complications Negative for: history of anesthetic complications  Airway Mallampati: II  TM Distance: >3 FB Neck ROM: full    Dental  (+) Teeth Intact Lower gum line damaged from failed skin graft:   Pulmonary shortness of breath, asthma ,    Pulmonary exam normal breath sounds clear to auscultation       Cardiovascular hypertension, Normal cardiovascular exam+ dysrhythmias  Rhythm:regular Rate:Normal  Right atrial lipoma   Neuro/Psych PSYCHIATRIC DISORDERS Anxiety  Neuromuscular disease    GI/Hepatic Neg liver ROS, GERD  ,IBS Eosinophilic Esophagitis   Endo/Other  negative endocrine ROS  Renal/GU negative Renal ROS     Musculoskeletal  (+) Fibromyalgia -  Abdominal   Peds  Hematology  (+) anemia ,   Anesthesia Other Findings Chronic pain "over whole body her whole life"  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, but has been consistently treated for this, had a swallow study performed that showed no narrowing or stenosis   Reproductive/Obstetrics negative OB ROS                          Anesthesia Physical Anesthesia Plan  ASA: III  Anesthesia Plan: General   Post-op Pain Management:    Induction: Intravenous  Airway Management Planned: Oral ETT  Additional Equipment: Arterial line, CVP, PA Cath, Ultrasound Guidance Line Placement and TEE  Intra-op Plan:   Post-operative Plan: Post-operative intubation/ventilation  Informed Consent: I have reviewed the patients History and Physical, chart, labs and discussed the procedure including the risks, benefits and alternatives for the proposed anesthesia with the patient or authorized representative who has indicated his/her  understanding and acceptance.   Dental Advisory Given  Plan Discussed with: Anesthesiologist, CRNA and Surgeon  Anesthesia Plan Comments: (Will place left sided MAC line, rec left A line, size 37 double lumen ETT, etomidate and succinylcholine for induction)      Anesthesia Quick Evaluation

## 2016-07-08 NOTE — Telephone Encounter (Signed)
Discussed the results of the patient's cardiac gated CT angiogram of the heart as well as the CT and gram of the aorta and iliac vessels. The cardiac gated CT angiogram of the heart demonstrates normal coronary artery anatomy with no signs of significant coronary artery disease. A mass in the right atrium has characteristics suggestive of likely benign lipoma rather than a myxoma. No other significant abnormalities are noted here we plan to proceed with surgery as previously scheduled.  Rexene Alberts, MD 07/08/2016 4:43 PM

## 2016-07-09 ENCOUNTER — Ambulatory Visit (HOSPITAL_COMMUNITY)
Admission: RE | Admit: 2016-07-09 | Discharge: 2016-07-09 | Disposition: A | Discharge status: Home / Self Care | Payer: 59 | Source: Ambulatory Visit | Attending: Thoracic Surgery (Cardiothoracic Vascular Surgery) | Admitting: Thoracic Surgery (Cardiothoracic Vascular Surgery)

## 2016-07-09 ENCOUNTER — Encounter (HOSPITAL_COMMUNITY): Payer: Self-pay

## 2016-07-09 ENCOUNTER — Ambulatory Visit (HOSPITAL_BASED_OUTPATIENT_CLINIC_OR_DEPARTMENT_OTHER)
Admission: RE | Admit: 2016-07-09 | Discharge: 2016-07-09 | Disposition: A | Discharge status: Home / Self Care | Payer: 59 | Source: Ambulatory Visit | Attending: Thoracic Surgery (Cardiothoracic Vascular Surgery) | Admitting: Thoracic Surgery (Cardiothoracic Vascular Surgery)

## 2016-07-09 ENCOUNTER — Encounter (HOSPITAL_COMMUNITY)
Admission: RE | Admit: 2016-07-09 | Discharge: 2016-07-09 | Disposition: A | Discharge status: Home / Self Care | Payer: 59 | Source: Ambulatory Visit | Attending: Thoracic Surgery (Cardiothoracic Vascular Surgery) | Admitting: Thoracic Surgery (Cardiothoracic Vascular Surgery)

## 2016-07-09 DIAGNOSIS — D219 Benign neoplasm of connective and other soft tissue, unspecified: Secondary | ICD-10-CM

## 2016-07-09 DIAGNOSIS — I6523 Occlusion and stenosis of bilateral carotid arteries: Secondary | ICD-10-CM

## 2016-07-09 DIAGNOSIS — Z01812 Encounter for preprocedural laboratory examination: Secondary | ICD-10-CM | POA: Insufficient documentation

## 2016-07-09 DIAGNOSIS — Z0183 Encounter for blood typing: Secondary | ICD-10-CM | POA: Insufficient documentation

## 2016-07-09 DIAGNOSIS — I4510 Unspecified right bundle-branch block: Secondary | ICD-10-CM | POA: Insufficient documentation

## 2016-07-09 DIAGNOSIS — Z01818 Encounter for other preprocedural examination: Secondary | ICD-10-CM | POA: Insufficient documentation

## 2016-07-09 DIAGNOSIS — D151 Benign neoplasm of heart: Secondary | ICD-10-CM | POA: Insufficient documentation

## 2016-07-09 HISTORY — DX: Cardiac arrhythmia, unspecified: I49.9

## 2016-07-09 HISTORY — DX: Other general symptoms and signs: R68.89

## 2016-07-09 HISTORY — DX: Cardiac murmur, unspecified: R01.1

## 2016-07-09 HISTORY — DX: Unspecified osteoarthritis, unspecified site: M19.90

## 2016-07-09 HISTORY — DX: Unspecified lesions of oral mucosa: K13.70

## 2016-07-09 HISTORY — DX: Reserved for inherently not codable concepts without codable children: IMO0001

## 2016-07-09 LAB — CBC
HCT: 42 % (ref 36.0–46.0)
Hemoglobin: 13.9 g/dL (ref 12.0–15.0)
MCH: 29.6 pg (ref 26.0–34.0)
MCHC: 33.1 g/dL (ref 30.0–36.0)
MCV: 89.6 fL (ref 78.0–100.0)
PLATELETS: 247 10*3/uL (ref 150–400)
RBC: 4.69 MIL/uL (ref 3.87–5.11)
RDW: 12.7 % (ref 11.5–15.5)
WBC: 10.8 10*3/uL — AB (ref 4.0–10.5)

## 2016-07-09 LAB — PULMONARY FUNCTION TEST
DL/VA % pred: 86 %
DL/VA: 4.46 ml/min/mmHg/L
DLCO COR % PRED: 77 %
DLCO UNC % PRED: 78 %
DLCO UNC: 22.21 ml/min/mmHg
DLCO cor: 21.82 ml/min/mmHg
FEF 25-75 PRE: 1.04 L/s
FEF 25-75 Post: 0.7 L/sec
FEF2575-%Change-Post: -32 %
FEF2575-%Pred-Post: 22 %
FEF2575-%Pred-Pre: 33 %
FEV1-%CHANGE-POST: -33 %
FEV1-%PRED-POST: 45 %
FEV1-%Pred-Pre: 68 %
FEV1-POST: 1.46 L
FEV1-PRE: 2.2 L
FEV1FVC-%Change-Post: -32 %
FEV1FVC-%Pred-Pre: 81 %
FEV6-%Change-Post: -5 %
FEV6-%PRED-POST: 81 %
FEV6-%Pred-Pre: 85 %
FEV6-POST: 3.17 L
FEV6-PRE: 3.35 L
FEV6FVC-%PRED-POST: 102 %
FEV6FVC-%PRED-PRE: 102 %
FVC-%CHANGE-POST: -1 %
FVC-%Pred-Post: 82 %
FVC-%Pred-Pre: 83 %
FVC-Post: 3.31 L
FVC-Pre: 3.35 L
POST FEV6/FVC RATIO: 100 %
PRE FEV6/FVC RATIO: 100 %
Post FEV1/FVC ratio: 44 %
Pre FEV1/FVC ratio: 66 %
RV % PRED: 141 %
RV: 2.6 L
TLC % PRED: 107 %
TLC: 5.9 L

## 2016-07-09 LAB — VAS US DOPPLER PRE CABG
LCCADSYS: -103 cm/s
LCCAPDIAS: -20 cm/s
LCCAPSYS: -106 cm/s
LEFT ECA DIAS: -7 cm/s
LEFT VERTEBRAL DIAS: -17 cm/s
Left CCA dist dias: -24 cm/s
Left ICA dist dias: -41 cm/s
Left ICA dist sys: -100 cm/s
Left ICA prox dias: -13 cm/s
Left ICA prox sys: -50 cm/s
RCCAPDIAS: 14 cm/s
RCCAPSYS: 99 cm/s
RIGHT ECA DIAS: -13 cm/s
RIGHT VERTEBRAL DIAS: -8 cm/s
Right cca dist sys: -91 cm/s

## 2016-07-09 LAB — ABO/RH: ABO/RH(D): O POS

## 2016-07-09 LAB — URINALYSIS, ROUTINE W REFLEX MICROSCOPIC
BILIRUBIN URINE: NEGATIVE
GLUCOSE, UA: NEGATIVE mg/dL
KETONES UR: NEGATIVE mg/dL
LEUKOCYTES UA: NEGATIVE
Nitrite: NEGATIVE
PH: 6 (ref 5.0–8.0)
PROTEIN: NEGATIVE mg/dL
Specific Gravity, Urine: 1.016 (ref 1.005–1.030)

## 2016-07-09 LAB — COMPREHENSIVE METABOLIC PANEL
ALBUMIN: 4.2 g/dL (ref 3.5–5.0)
ALT: 16 U/L (ref 14–54)
AST: 17 U/L (ref 15–41)
Alkaline Phosphatase: 50 U/L (ref 38–126)
Anion gap: 12 (ref 5–15)
BUN: 14 mg/dL (ref 6–20)
CHLORIDE: 104 mmol/L (ref 101–111)
CO2: 20 mmol/L — AB (ref 22–32)
Calcium: 9.4 mg/dL (ref 8.9–10.3)
Creatinine, Ser: 0.72 mg/dL (ref 0.44–1.00)
GFR calc Af Amer: >60 mL/min (ref >60)
Glucose, Bld: 97 mg/dL (ref 65–99)
POTASSIUM: 3.7 mmol/L (ref 3.5–5.1)
SODIUM: 136 mmol/L (ref 135–145)
Total Bilirubin: 0.8 mg/dL (ref 0.3–1.2)
Total Protein: 6.8 g/dL (ref 6.5–8.1)

## 2016-07-09 LAB — SURGICAL PCR SCREEN
MRSA, PCR: NEGATIVE
STAPHYLOCOCCUS AUREUS: NEGATIVE

## 2016-07-09 LAB — URINE MICROSCOPIC-ADD ON

## 2016-07-09 LAB — TYPE AND SCREEN
ABO/RH(D): O POS
ANTIBODY SCREEN: NEGATIVE

## 2016-07-09 LAB — BLOOD GAS, ARTERIAL
ACID-BASE DEFICIT: 0.4 mmol/L (ref 0.0–2.0)
Bicarbonate: 22.8 mmol/L (ref 20.0–28.0)
DRAWN BY: 421801
FIO2: 21
O2 SAT: 96.1 %
PATIENT TEMPERATURE: 98.6
pCO2 arterial: 31.3 mmHg — ABNORMAL LOW (ref 32.0–48.0)
pH, Arterial: 7.476 — ABNORMAL HIGH (ref 7.350–7.450)
pO2, Arterial: 77.3 mmHg — ABNORMAL LOW (ref 83.0–108.0)

## 2016-07-09 LAB — PROTIME-INR
INR: 1.09
PROTHROMBIN TIME: 14.1 s (ref 11.4–15.2)

## 2016-07-09 LAB — APTT: APTT: 36 s (ref 24–36)

## 2016-07-09 MED ORDER — ALBUTEROL SULFATE (2.5 MG/3ML) 0.083% IN NEBU: 2.5000 mg | INHALATION_SOLUTION | Freq: Once | RESPIRATORY_TRACT | Status: DC

## 2016-07-09 NOTE — Pre-Procedure Instructions (Addendum)
Emily Joseph  07/09/2016      [facility] [place] [address] Phone: [phone] Fax: [phone]    Your procedure is scheduled on 07/11/16.  Report to [facility] Admitting at  530 A.M.  Call this number if you have problems the morning of surgery:  [phone]   Remember:  Do not eat food or drink liquids after midnight.  Take these medicines the morning of surgery with A SIP OF WATER inhaler if needed(bring with you), omeprazole  STOP all herbel meds, nsaids (aleve,naproxen,advil,ibuprofen) 5 days prior to surgery start NOW including diclofenac, folate, vitamins   Do not wear jewelry, make-up or nail polish.  Do not wear lotions, powders, or perfumes, or deoderant.  Do not shave 48 hours prior to surgery.  Men may shave face and neck.  Do not bring valuables to the hospital.  [facility] is not responsible for any belongings or valuables.  Contacts, dentures or bridgework may not be worn into surgery.  Leave your suitcase in the car.  After surgery it may be brought to your room.  For patients admitted to the hospital, discharge time will be determined by your treatment team.  Patients discharged the day of surgery will not be allowed to drive home.   Name and phone number of your driver:    Special instructions:   Special Instructions: [facility] - Preparing for Surgery  Before surgery, you can play an important role.  Because skin is not sterile, your skin needs to be as free of germs as possible.  You can reduce the number of germs on you skin by washing with CHG (chlorahexidine gluconate) soap before surgery.  CHG is an antiseptic cleaner which kills germs and bonds with the skin to continue killing germs even after washing.  Please DO NOT use if you have an allergy to CHG or antibacterial soaps.  If your skin becomes reddened/irritated stop using  the CHG and inform your nurse when you arrive at Short Stay.  Do not shave (including legs and underarms) for at least 48 hours prior to the first CHG shower.  You may shave your face.  Please follow these instructions carefully:   1.  Shower with CHG Soap the night before surgery and the morning of Surgery.  2.  If you choose to wash your hair, wash your hair first as usual with your normal shampoo.  3.  After you shampoo, rinse your hair and body thoroughly to remove the Shampoo.  4.  Use CHG as you would any other liquid soap.  You can apply chg directly  to the skin and wash gently with scrungie or a clean washcloth.  5.  Apply the CHG Soap to your body ONLY FROM THE NECK DOWN.  Do not use on open wounds or open sores.  Avoid contact with your eyes ears, mouth and genitals (private parts).  Wash genitals (private parts)       with your normal soap.  6.  Wash thoroughly, paying special attention to the area where your surgery will be performed.  7.  Thoroughly rinse your body with warm water from the neck down.  8.  DO NOT shower/wash with your normal soap after using and rinsing off the CHG Soap.  9.  Pat yourself dry with a clean towel.  10.  Wear clean pajamas.            11.  Place clean sheets on your bed the night of your first shower and do not sleep with pets.  Day of Surgery  Do not apply any lotions/deodorants the morning of surgery.  Please wear clean clothes to the hospital/surgery center.  Please read over the  fact sheets that you were given.

## 2016-07-09 NOTE — Progress Notes (Signed)
Pre-op Cardiac Surgery  Carotid Findings:   Findings are consistent with a 1-39 percent stenosis involving the right internal carotid artery and the left internal carotid artery. The vertebral arteries demonstrate antegrade flow.  Upper Extremity Right Left  Brachial Pressures 131 122  Radial Waveforms Triphasic Triphasic  Ulnar Waveforms Triphasic Triphasic  Palmar Arch (Allen's Test) Palmar waveforms remain within normal limits with radial compression but diminish greater than 50 percent with ulnar compression. Palmar waveforms remain within normal limits with radial and ulnar compression.    Lower  Extremity Right Left  Dorsalis Pedis 128 Triphasic 144 Triphasic  Posterior Tibial 127 Triphasic 140 Triphasic  Ankle/Brachial Indices 0.98 1.1    Findings:   Bilateral ABI's are within normal limits at rest.

## 2016-07-10 LAB — HEMOGLOBIN A1C
HEMOGLOBIN A1C: 5.1 % (ref 4.8–5.6)
MEAN PLASMA GLUCOSE: 100 mg/dL

## 2016-07-10 MED ORDER — DOPAMINE-DEXTROSE 3.2-5 MG/ML-% IV SOLN
0.0000 ug/kg/min | INTRAVENOUS | Status: DC
  Filled 2016-07-10: qty 250

## 2016-07-10 MED ORDER — DEXMEDETOMIDINE HCL IN NACL 400 MCG/100ML IV SOLN
0.1000 ug/kg/h | INTRAVENOUS | Status: AC
  Administered 2016-07-11: .5 ug/kg/h via INTRAVENOUS
  Filled 2016-07-10: qty 100

## 2016-07-10 MED ORDER — CHLORHEXIDINE GLUCONATE 0.12 % MT SOLN
15.0000 mL | OROMUCOSAL | Status: AC
  Administered 2016-07-11: 15 mL via OROMUCOSAL
  Filled 2016-07-10: qty 15

## 2016-07-10 MED ORDER — DEXTROSE 5 % IV SOLN
750.0000 mg | INTRAVENOUS | Status: DC
  Filled 2016-07-10: qty 750

## 2016-07-10 MED ORDER — AMINOCAPROIC ACID 250 MG/ML IV SOLN
INTRAVENOUS | Status: AC
  Administered 2016-07-11: 70 mL/h via INTRAVENOUS
  Filled 2016-07-10: qty 40

## 2016-07-10 MED ORDER — INSULIN REGULAR HUMAN 100 UNIT/ML IJ SOLN
INTRAMUSCULAR | Status: AC
  Administered 2016-07-11: .9 [IU]/h via INTRAVENOUS
  Filled 2016-07-10: qty 2.5

## 2016-07-10 MED ORDER — PLASMA-LYTE 148 IV SOLN
INTRAVENOUS | Status: DC
  Filled 2016-07-10: qty 2.5

## 2016-07-10 MED ORDER — METOPROLOL TARTRATE 12.5 MG HALF TABLET
12.5000 mg | ORAL_TABLET | ORAL | Status: AC
  Administered 2016-07-11: 12.5 mg via ORAL
  Filled 2016-07-10: qty 1

## 2016-07-10 MED ORDER — DEXTROSE 5 % IV SOLN
1.5000 g | INTRAVENOUS | Status: AC
  Administered 2016-07-11: 1.5 g via INTRAVENOUS
  Administered 2016-07-11: .75 g via INTRAVENOUS
  Filled 2016-07-10: qty 1.5

## 2016-07-10 MED ORDER — PHENYLEPHRINE HCL 10 MG/ML IJ SOLN
30.0000 ug/min | INTRAVENOUS | Status: AC
  Administered 2016-07-11: 10 ug/min via INTRAVENOUS
  Filled 2016-07-10: qty 2

## 2016-07-10 MED ORDER — EPINEPHRINE HCL 1 MG/ML IJ SOLN
0.0000 ug/min | INTRAVENOUS | Status: DC
  Filled 2016-07-10: qty 4

## 2016-07-10 MED ORDER — NITROGLYCERIN IN D5W 200-5 MCG/ML-% IV SOLN
2.0000 ug/min | INTRAVENOUS | Status: DC
  Filled 2016-07-10: qty 250

## 2016-07-10 MED ORDER — MAGNESIUM SULFATE 50 % IJ SOLN
40.0000 meq | INTRAMUSCULAR | Status: DC
  Filled 2016-07-10: qty 10

## 2016-07-10 MED ORDER — VANCOMYCIN HCL 1000 MG IV SOLR
INTRAVENOUS | Status: AC
  Administered 2016-07-11: 1000 mL
  Filled 2016-07-10: qty 1000

## 2016-07-10 MED ORDER — VANCOMYCIN HCL 10 G IV SOLR
1250.0000 mg | INTRAVENOUS | Status: AC
  Administered 2016-07-11: 1250 mg via INTRAVENOUS
  Filled 2016-07-10: qty 1250

## 2016-07-10 MED ORDER — SODIUM CHLORIDE 0.9 % IV SOLN
INTRAVENOUS | Status: DC
  Filled 2016-07-10: qty 30

## 2016-07-10 MED ORDER — POTASSIUM CHLORIDE 2 MEQ/ML IV SOLN
80.0000 meq | INTRAVENOUS | Status: DC
  Filled 2016-07-10: qty 40

## 2016-07-11 ENCOUNTER — Inpatient Hospital Stay (HOSPITAL_COMMUNITY): Payer: 59

## 2016-07-11 ENCOUNTER — Encounter (HOSPITAL_COMMUNITY): Payer: Self-pay | Admitting: Urology

## 2016-07-11 ENCOUNTER — Inpatient Hospital Stay (HOSPITAL_COMMUNITY): Payer: 59 | Admitting: Anesthesiology

## 2016-07-11 ENCOUNTER — Encounter (HOSPITAL_COMMUNITY)
Admission: RE | Disposition: A | Discharge status: Home / Self Care | Payer: Self-pay | Source: Ambulatory Visit | Attending: Thoracic Surgery (Cardiothoracic Vascular Surgery)

## 2016-07-11 ENCOUNTER — Inpatient Hospital Stay (HOSPITAL_COMMUNITY)
Admission: RE | Admit: 2016-07-11 | Discharge: 2016-07-16 | DRG: 982 | Disposition: A | Discharge status: Home / Self Care | Payer: 59 | Source: Ambulatory Visit | Attending: Thoracic Surgery (Cardiothoracic Vascular Surgery) | Admitting: Thoracic Surgery (Cardiothoracic Vascular Surgery)

## 2016-07-11 DIAGNOSIS — D174 Benign lipomatous neoplasm of intrathoracic organs: Secondary | ICD-10-CM | POA: Diagnosis not present

## 2016-07-11 DIAGNOSIS — J9811 Atelectasis: Secondary | ICD-10-CM

## 2016-07-11 DIAGNOSIS — E8770 Fluid overload, unspecified: Secondary | ICD-10-CM | POA: Diagnosis not present

## 2016-07-11 DIAGNOSIS — D219 Benign neoplasm of connective and other soft tissue, unspecified: Secondary | ICD-10-CM

## 2016-07-11 DIAGNOSIS — K219 Gastro-esophageal reflux disease without esophagitis: Secondary | ICD-10-CM | POA: Diagnosis present

## 2016-07-11 DIAGNOSIS — N3090 Cystitis, unspecified without hematuria: Secondary | ICD-10-CM | POA: Diagnosis not present

## 2016-07-11 DIAGNOSIS — I071 Rheumatic tricuspid insufficiency: Secondary | ICD-10-CM | POA: Diagnosis present

## 2016-07-11 DIAGNOSIS — J939 Pneumothorax, unspecified: Secondary | ICD-10-CM

## 2016-07-11 DIAGNOSIS — G8929 Other chronic pain: Secondary | ICD-10-CM | POA: Diagnosis present

## 2016-07-11 DIAGNOSIS — I519 Heart disease, unspecified: Secondary | ICD-10-CM | POA: Diagnosis present

## 2016-07-11 DIAGNOSIS — K5909 Other constipation: Secondary | ICD-10-CM | POA: Diagnosis present

## 2016-07-11 DIAGNOSIS — Z79899 Other long term (current) drug therapy: Secondary | ICD-10-CM | POA: Diagnosis not present

## 2016-07-11 DIAGNOSIS — Z09 Encounter for follow-up examination after completed treatment for conditions other than malignant neoplasm: Secondary | ICD-10-CM

## 2016-07-11 DIAGNOSIS — F419 Anxiety disorder, unspecified: Secondary | ICD-10-CM | POA: Diagnosis not present

## 2016-07-11 DIAGNOSIS — R000 Tachycardia, unspecified: Secondary | ICD-10-CM | POA: Diagnosis not present

## 2016-07-11 DIAGNOSIS — Q211 Atrial septal defect: Secondary | ICD-10-CM | POA: Diagnosis not present

## 2016-07-11 DIAGNOSIS — B9689 Other specified bacterial agents as the cause of diseases classified elsewhere: Secondary | ICD-10-CM | POA: Diagnosis not present

## 2016-07-11 DIAGNOSIS — R5383 Other fatigue: Secondary | ICD-10-CM | POA: Diagnosis present

## 2016-07-11 DIAGNOSIS — J45909 Unspecified asthma, uncomplicated: Secondary | ICD-10-CM | POA: Diagnosis present

## 2016-07-11 DIAGNOSIS — Z7951 Long term (current) use of inhaled steroids: Secondary | ICD-10-CM | POA: Diagnosis not present

## 2016-07-11 DIAGNOSIS — Z419 Encounter for procedure for purposes other than remedying health state, unspecified: Secondary | ICD-10-CM

## 2016-07-11 DIAGNOSIS — Z9689 Presence of other specified functional implants: Secondary | ICD-10-CM

## 2016-07-11 DIAGNOSIS — M797 Fibromyalgia: Secondary | ICD-10-CM | POA: Diagnosis present

## 2016-07-11 DIAGNOSIS — D62 Acute posthemorrhagic anemia: Secondary | ICD-10-CM | POA: Diagnosis not present

## 2016-07-11 DIAGNOSIS — Z8709 Personal history of other diseases of the respiratory system: Secondary | ICD-10-CM

## 2016-07-11 DIAGNOSIS — D1779 Benign lipomatous neoplasm of other sites: Secondary | ICD-10-CM | POA: Diagnosis present

## 2016-07-11 DIAGNOSIS — I5189 Other ill-defined heart diseases: Secondary | ICD-10-CM

## 2016-07-11 HISTORY — PX: MINIMALLY INVASIVE EXCISION OF ATRIAL MYXOMA: SHX5974

## 2016-07-11 HISTORY — DX: Personal history of other diseases of the respiratory system: Z87.09

## 2016-07-11 HISTORY — PX: TEE WITHOUT CARDIOVERSION: SHX5443

## 2016-07-11 HISTORY — DX: Personal history of pneumonia (recurrent): Z87.01

## 2016-07-11 LAB — POCT I-STAT, CHEM 8
BUN: 6 mg/dL (ref 6–20)
BUN: 7 mg/dL (ref 6–20)
BUN: 7 mg/dL (ref 6–20)
BUN: 6 mg/dL (ref 6–20)
BUN: 4 mg/dL — AB (ref 6–20)
CALCIUM ION: 1.12 mmol/L — AB (ref 1.15–1.40)
CALCIUM ION: 1.1 mmol/L — AB (ref 1.15–1.40)
CHLORIDE: 103 mmol/L (ref 101–111)
CHLORIDE: 97 mmol/L — AB (ref 101–111)
CHLORIDE: 101 mmol/L (ref 101–111)
CHLORIDE: 105 mmol/L (ref 101–111)
CREATININE: 0.3 mg/dL — AB (ref 0.44–1.00)
CREATININE: 0.2 mg/dL — AB (ref 0.44–1.00)
CREATININE: 0.3 mg/dL — AB (ref 0.44–1.00)
Calcium, Ion: 0.93 mmol/L — ABNORMAL LOW (ref 1.15–1.40)
Calcium, Ion: 1.19 mmol/L (ref 1.15–1.40)
Calcium, Ion: 1.03 mmol/L — ABNORMAL LOW (ref 1.15–1.40)
Chloride: 102 mmol/L (ref 101–111)
Creatinine, Ser: 0.3 mg/dL — ABNORMAL LOW (ref 0.44–1.00)
Creatinine, Ser: <0.2 mg/dL — ABNORMAL LOW (ref 0.44–1.00)
GLUCOSE: 155 mg/dL — AB (ref 65–99)
GLUCOSE: 125 mg/dL — AB (ref 65–99)
Glucose, Bld: 102 mg/dL — ABNORMAL HIGH (ref 65–99)
Glucose, Bld: 122 mg/dL — ABNORMAL HIGH (ref 65–99)
Glucose, Bld: 219 mg/dL — ABNORMAL HIGH (ref 65–99)
HCT: 26 % — ABNORMAL LOW (ref 36.0–46.0)
HCT: 28 % — ABNORMAL LOW (ref 36.0–46.0)
HEMATOCRIT: 25 % — AB (ref 36.0–46.0)
HEMATOCRIT: 31 % — AB (ref 36.0–46.0)
HEMATOCRIT: 24 % — AB (ref 36.0–46.0)
HEMOGLOBIN: 10.5 g/dL — AB (ref 12.0–15.0)
HEMOGLOBIN: 8.2 g/dL — AB (ref 12.0–15.0)
Hemoglobin: 8.5 g/dL — ABNORMAL LOW (ref 12.0–15.0)
Hemoglobin: 8.8 g/dL — ABNORMAL LOW (ref 12.0–15.0)
Hemoglobin: 9.5 g/dL — ABNORMAL LOW (ref 12.0–15.0)
POTASSIUM: 3.5 mmol/L (ref 3.5–5.1)
POTASSIUM: 3.8 mmol/L (ref 3.5–5.1)
POTASSIUM: 3.4 mmol/L — AB (ref 3.5–5.1)
Potassium: 4.5 mmol/L (ref 3.5–5.1)
Potassium: 4.1 mmol/L (ref 3.5–5.1)
SODIUM: 138 mmol/L (ref 135–145)
SODIUM: 141 mmol/L (ref 135–145)
Sodium: 139 mmol/L (ref 135–145)
Sodium: 140 mmol/L (ref 135–145)
Sodium: 137 mmol/L (ref 135–145)
TCO2: 25 mmol/L (ref 0–100)
TCO2: 27 mmol/L (ref 0–100)
TCO2: 28 mmol/L (ref 0–100)
TCO2: 26 mmol/L (ref 0–100)
TCO2: 23 mmol/L (ref 0–100)

## 2016-07-11 LAB — GLUCOSE, CAPILLARY
GLUCOSE-CAPILLARY: 112 mg/dL — AB (ref 65–99)
GLUCOSE-CAPILLARY: 118 mg/dL — AB (ref 65–99)
Glucose-Capillary: 116 mg/dL — ABNORMAL HIGH (ref 65–99)
Glucose-Capillary: 130 mg/dL — ABNORMAL HIGH (ref 65–99)
Glucose-Capillary: 125 mg/dL — ABNORMAL HIGH (ref 65–99)
Glucose-Capillary: 117 mg/dL — ABNORMAL HIGH (ref 65–99)
Glucose-Capillary: 91 mg/dL (ref 65–99)
Glucose-Capillary: 125 mg/dL — ABNORMAL HIGH (ref 65–99)
Glucose-Capillary: 163 mg/dL — ABNORMAL HIGH (ref 65–99)

## 2016-07-11 LAB — CBC
HEMATOCRIT: 32.5 % — AB (ref 36.0–46.0)
HEMATOCRIT: 30.3 % — AB (ref 36.0–46.0)
HEMOGLOBIN: 11 g/dL — AB (ref 12.0–15.0)
Hemoglobin: 10.1 g/dL — ABNORMAL LOW (ref 12.0–15.0)
MCH: 29.8 pg (ref 26.0–34.0)
MCH: 29.6 pg (ref 26.0–34.0)
MCHC: 33.8 g/dL (ref 30.0–36.0)
MCHC: 33.3 g/dL (ref 30.0–36.0)
MCV: 88.1 fL (ref 78.0–100.0)
MCV: 88.9 fL (ref 78.0–100.0)
PLATELETS: 174 10*3/uL (ref 150–400)
Platelets: 177 10*3/uL (ref 150–400)
RBC: 3.69 MIL/uL — AB (ref 3.87–5.11)
RBC: 3.41 MIL/uL — AB (ref 3.87–5.11)
RDW: 12.7 % (ref 11.5–15.5)
RDW: 12.5 % (ref 11.5–15.5)
WBC: 20.8 10*3/uL — ABNORMAL HIGH (ref 4.0–10.5)
WBC: 23.7 10*3/uL — AB (ref 4.0–10.5)

## 2016-07-11 LAB — POCT I-STAT 3, ART BLOOD GAS (G3+)
ACID-BASE DEFICIT: 1 mmol/L (ref 0.0–2.0)
ACID-BASE DEFICIT: 2 mmol/L (ref 0.0–2.0)
ACID-BASE DEFICIT: 4 mmol/L — AB (ref 0.0–2.0)
Acid-base deficit: 4 mmol/L — ABNORMAL HIGH (ref 0.0–2.0)
BICARBONATE: 23.3 mmol/L (ref 20.0–28.0)
BICARBONATE: 24.7 mmol/L (ref 20.0–28.0)
BICARBONATE: 21.2 mmol/L (ref 20.0–28.0)
BICARBONATE: 22 mmol/L (ref 20.0–28.0)
Bicarbonate: 24.8 mmol/L (ref 20.0–28.0)
O2 SAT: 100 %
O2 SAT: 100 %
O2 SAT: 100 %
O2 Saturation: 100 %
O2 Saturation: 99 %
PCO2 ART: 38.5 mmHg (ref 32.0–48.0)
PCO2 ART: 37 mmHg (ref 32.0–48.0)
PH ART: 7.418 (ref 7.350–7.450)
PH ART: 7.32 — AB (ref 7.350–7.450)
PH ART: 7.34 — AB (ref 7.350–7.450)
PO2 ART: 428 mmHg — AB (ref 83.0–108.0)
PO2 ART: 414 mmHg — AB (ref 83.0–108.0)
PO2 ART: 204 mmHg — AB (ref 83.0–108.0)
PO2 ART: 173 mmHg — AB (ref 83.0–108.0)
Patient temperature: 97
Patient temperature: 98
TCO2: 26 mmol/L (ref 0–100)
TCO2: 24 mmol/L (ref 0–100)
TCO2: 26 mmol/L (ref 0–100)
TCO2: 22 mmol/L (ref 0–100)
TCO2: 23 mmol/L (ref 0–100)
pCO2 arterial: 37.3 mmHg (ref 32.0–48.0)
pCO2 arterial: 47.3 mmHg (ref 32.0–48.0)
pCO2 arterial: 40.6 mmHg (ref 32.0–48.0)
pH, Arterial: 7.404 (ref 7.350–7.450)
pH, Arterial: 7.362 (ref 7.350–7.450)
pO2, Arterial: 232 mmHg — ABNORMAL HIGH (ref 83.0–108.0)

## 2016-07-11 LAB — POCT I-STAT 4, (NA,K, GLUC, HGB,HCT)
GLUCOSE: 110 mg/dL — AB (ref 65–99)
HCT: 30 % — ABNORMAL LOW (ref 36.0–46.0)
HEMOGLOBIN: 10.2 g/dL — AB (ref 12.0–15.0)
POTASSIUM: 3.3 mmol/L — AB (ref 3.5–5.1)
SODIUM: 142 mmol/L (ref 135–145)

## 2016-07-11 LAB — MAGNESIUM: MAGNESIUM: 3.2 mg/dL — AB (ref 1.7–2.4)

## 2016-07-11 LAB — CREATININE, SERUM
CREATININE: 0.5 mg/dL (ref 0.44–1.00)
GFR calc Af Amer: >60 mL/min (ref >60)
GFR calc non Af Amer: >60 mL/min (ref >60)

## 2016-07-11 LAB — PROTIME-INR
INR: 1.49
Prothrombin Time: 18.1 seconds — ABNORMAL HIGH (ref 11.4–15.2)

## 2016-07-11 LAB — APTT: aPTT: 37 seconds — ABNORMAL HIGH (ref 24–36)

## 2016-07-11 LAB — HEMOGLOBIN AND HEMATOCRIT, BLOOD
HCT: 26.4 % — ABNORMAL LOW (ref 36.0–46.0)
HEMOGLOBIN: 9.1 g/dL — AB (ref 12.0–15.0)

## 2016-07-11 LAB — PLATELET COUNT: Platelets: 241 10*3/uL (ref 150–400)

## 2016-07-11 SURGERY — EXCISION, MYXOMA, CARDIAC ATRIUM, MINIMALLY INVASIVE
Anesthesia: General | Site: Chest | Laterality: Right

## 2016-07-11 MED ORDER — SODIUM CHLORIDE 0.9% FLUSH
3.0000 mL | Freq: Two times a day (BID) | INTRAVENOUS | Status: DC
  Administered 2016-07-12: 3 mL via INTRAVENOUS

## 2016-07-11 MED ORDER — SODIUM CHLORIDE 0.9 % IV SOLN: INTRAVENOUS | Status: DC

## 2016-07-11 MED ORDER — LACTATED RINGERS IV SOLN
INTRAVENOUS | Status: DC | PRN
  Administered 2016-07-11: 08:00:00 via INTRAVENOUS

## 2016-07-11 MED ORDER — MORPHINE SULFATE (PF) 2 MG/ML IV SOLN
2.0000 mg | INTRAVENOUS | Status: DC | PRN
  Administered 2016-07-11: 1 mg via INTRAVENOUS
  Administered 2016-07-11: 2 mg via INTRAVENOUS
  Administered 2016-07-11: 1 mg via INTRAVENOUS
  Administered 2016-07-12 (×4): 2 mg via INTRAVENOUS
  Filled 2016-07-11 (×5): qty 1

## 2016-07-11 MED ORDER — ACETAMINOPHEN 500 MG PO TABS
1000.0000 mg | ORAL_TABLET | Freq: Four times a day (QID) | ORAL | Status: DC
  Administered 2016-07-11 – 2016-07-13 (×5): 1000 mg via ORAL
  Filled 2016-07-11 (×4): qty 2

## 2016-07-11 MED ORDER — METOPROLOL TARTRATE 12.5 MG HALF TABLET
12.5000 mg | ORAL_TABLET | Freq: Two times a day (BID) | ORAL | Status: DC
  Administered 2016-07-13: 12.5 mg via ORAL
  Filled 2016-07-11: qty 1

## 2016-07-11 MED ORDER — TRAMADOL HCL 50 MG PO TABS
50.0000 mg | ORAL_TABLET | ORAL | Status: DC | PRN
  Administered 2016-07-12 (×3): 50 mg via ORAL
  Filled 2016-07-11: qty 2
  Filled 2016-07-11 (×2): qty 1

## 2016-07-11 MED ORDER — ETOMIDATE 2 MG/ML IV SOLN
INTRAVENOUS | Status: DC | PRN
  Administered 2016-07-11: 12 mg via INTRAVENOUS

## 2016-07-11 MED ORDER — DIPHENHYDRAMINE HCL 50 MG/ML IJ SOLN
INTRAMUSCULAR | Status: DC | PRN
  Administered 2016-07-11: 12.5 mg via INTRAVENOUS

## 2016-07-11 MED ORDER — BISACODYL 5 MG PO TBEC
10.0000 mg | DELAYED_RELEASE_TABLET | Freq: Every day | ORAL | Status: DC
  Administered 2016-07-12: 10 mg via ORAL
  Filled 2016-07-11: qty 2

## 2016-07-11 MED ORDER — FAMOTIDINE IN NACL 20-0.9 MG/50ML-% IV SOLN
20.0000 mg | Freq: Two times a day (BID) | INTRAVENOUS | Status: AC
  Administered 2016-07-11: 20 mg via INTRAVENOUS

## 2016-07-11 MED ORDER — PHENYLEPHRINE HCL 10 MG/ML IJ SOLN
0.0000 ug/min | INTRAVENOUS | Status: DC
  Filled 2016-07-11: qty 2

## 2016-07-11 MED ORDER — ACETAMINOPHEN 160 MG/5ML PO SOLN: 650.0000 mg | Freq: Once | ORAL | Status: AC

## 2016-07-11 MED ORDER — MAGNESIUM SULFATE 4 GM/100ML IV SOLN
4.0000 g | Freq: Once | INTRAVENOUS | Status: AC
  Administered 2016-07-11: 4 g via INTRAVENOUS
  Filled 2016-07-11: qty 100

## 2016-07-11 MED ORDER — GLUTARALDEHYDE 0.625% SOAKING SOLUTION
TOPICAL | Status: AC
  Administered 2016-07-11: 1 via TOPICAL
  Filled 2016-07-11: qty 50

## 2016-07-11 MED ORDER — POTASSIUM CHLORIDE 10 MEQ/50ML IV SOLN
10.0000 meq | INTRAVENOUS | Status: AC
  Administered 2016-07-11 (×2): 10 meq via INTRAVENOUS

## 2016-07-11 MED ORDER — DEXAMETHASONE SODIUM PHOSPHATE 10 MG/ML IJ SOLN
INTRAMUSCULAR | Status: DC | PRN
  Administered 2016-07-11: 5 mg via INTRAVENOUS

## 2016-07-11 MED ORDER — ASPIRIN 81 MG PO CHEW: 324.0000 mg | CHEWABLE_TABLET | Freq: Every day | ORAL | Status: DC

## 2016-07-11 MED ORDER — ONDANSETRON HCL 4 MG/2ML IJ SOLN
INTRAMUSCULAR | Status: DC | PRN
  Administered 2016-07-11: 4 mg via INTRAVENOUS

## 2016-07-11 MED ORDER — MORPHINE SULFATE (PF) 2 MG/ML IV SOLN
1.0000 mg | INTRAVENOUS | Status: AC | PRN
  Filled 2016-07-11 (×2): qty 1

## 2016-07-11 MED ORDER — SODIUM CHLORIDE 0.45 % IV SOLN
INTRAVENOUS | Status: DC | PRN
  Administered 2016-07-11: 14:00:00 via INTRAVENOUS

## 2016-07-11 MED ORDER — SODIUM CHLORIDE 0.9 % IV SOLN
INTRAVENOUS | Status: DC
  Administered 2016-07-11: 2.6 [IU]/h via INTRAVENOUS
  Filled 2016-07-11 (×2): qty 2.5

## 2016-07-11 MED ORDER — BUPIVACAINE HCL (PF) 0.5 % IJ SOLN
INTRAMUSCULAR | Status: AC
  Filled 2016-07-11: qty 10

## 2016-07-11 MED ORDER — SUCCINYLCHOLINE CHLORIDE 20 MG/ML IJ SOLN
INTRAMUSCULAR | Status: DC | PRN
  Administered 2016-07-11: 80 mg via INTRAVENOUS

## 2016-07-11 MED ORDER — NITROGLYCERIN IN D5W 200-5 MCG/ML-% IV SOLN: 0.0000 ug/min | INTRAVENOUS | Status: DC

## 2016-07-11 MED ORDER — DEXMEDETOMIDINE HCL IN NACL 200 MCG/50ML IV SOLN
0.0000 ug/kg/h | INTRAVENOUS | Status: DC
  Filled 2016-07-11: qty 50

## 2016-07-11 MED ORDER — PROPOFOL 10 MG/ML IV BOLUS
INTRAVENOUS | Status: AC
  Filled 2016-07-11: qty 20

## 2016-07-11 MED ORDER — VANCOMYCIN HCL IN DEXTROSE 1-5 GM/200ML-% IV SOLN
1000.0000 mg | Freq: Once | INTRAVENOUS | Status: AC
  Administered 2016-07-11: 1000 mg via INTRAVENOUS
  Filled 2016-07-11: qty 200

## 2016-07-11 MED ORDER — PHENYLEPHRINE HCL 10 MG/ML IJ SOLN
INTRAMUSCULAR | Status: DC | PRN
  Administered 2016-07-11: 120 ug via INTRAVENOUS

## 2016-07-11 MED ORDER — LACTATED RINGERS IV SOLN: INTRAVENOUS | Status: DC

## 2016-07-11 MED ORDER — LACTATED RINGERS IV SOLN: 500.0000 mL | Freq: Once | INTRAVENOUS | Status: DC | PRN

## 2016-07-11 MED ORDER — FENTANYL CITRATE (PF) 250 MCG/5ML IJ SOLN
INTRAMUSCULAR | Status: DC | PRN
  Administered 2016-07-11 (×2): 50 ug via INTRAVENOUS
  Administered 2016-07-11: 100 ug via INTRAVENOUS
  Administered 2016-07-11: 200 ug via INTRAVENOUS
  Administered 2016-07-11: 100 ug via INTRAVENOUS
  Administered 2016-07-11: 50 ug via INTRAVENOUS
  Administered 2016-07-11: 200 ug via INTRAVENOUS

## 2016-07-11 MED ORDER — SODIUM CHLORIDE 0.9 % IV SOLN
INTRAVENOUS | Status: AC
  Administered 2016-07-11: 14:00:00 via INTRAVENOUS

## 2016-07-11 MED ORDER — CHLORHEXIDINE GLUCONATE 4 % EX LIQD: 30.0000 mL | CUTANEOUS | Status: DC

## 2016-07-11 MED ORDER — PANTOPRAZOLE SODIUM 40 MG PO TBEC
40.0000 mg | DELAYED_RELEASE_TABLET | Freq: Every day | ORAL | Status: DC
  Filled 2016-07-11: qty 1

## 2016-07-11 MED ORDER — BISACODYL 10 MG RE SUPP: 10.0000 mg | Freq: Every day | RECTAL | Status: DC

## 2016-07-11 MED ORDER — METOPROLOL TARTRATE 5 MG/5ML IV SOLN: 2.5000 mg | INTRAVENOUS | Status: DC | PRN

## 2016-07-11 MED ORDER — ACETAMINOPHEN 160 MG/5ML PO SOLN: 1000.0000 mg | Freq: Four times a day (QID) | ORAL | Status: DC

## 2016-07-11 MED ORDER — MIDAZOLAM HCL 10 MG/2ML IJ SOLN
INTRAMUSCULAR | Status: AC
  Filled 2016-07-11: qty 2

## 2016-07-11 MED ORDER — HEPARIN SODIUM (PORCINE) 1000 UNIT/ML IJ SOLN
INTRAMUSCULAR | Status: DC | PRN
  Administered 2016-07-11: 27000 [IU] via INTRAVENOUS

## 2016-07-11 MED ORDER — ASPIRIN EC 325 MG PO TBEC
325.0000 mg | DELAYED_RELEASE_TABLET | Freq: Every day | ORAL | Status: DC
  Administered 2016-07-12: 325 mg via ORAL
  Filled 2016-07-11: qty 1

## 2016-07-11 MED ORDER — BUPIVACAINE 0.5 % ON-Q PUMP SINGLE CATH 400 ML
400.0000 mL | INJECTION | Status: AC
  Administered 2016-07-11: 400 mL
  Filled 2016-07-11: qty 400

## 2016-07-11 MED ORDER — ONDANSETRON HCL 4 MG/2ML IJ SOLN
4.0000 mg | Freq: Four times a day (QID) | INTRAMUSCULAR | Status: DC | PRN
  Administered 2016-07-11: 2 mg via INTRAVENOUS
  Administered 2016-07-12 – 2016-07-13 (×4): 4 mg via INTRAVENOUS
  Filled 2016-07-11 (×5): qty 2

## 2016-07-11 MED ORDER — ALBUMIN HUMAN 5 % IV SOLN
250.0000 mL | INTRAVENOUS | Status: AC | PRN
  Administered 2016-07-11 (×2): 250 mL via INTRAVENOUS

## 2016-07-11 MED ORDER — 0.9 % SODIUM CHLORIDE (POUR BTL) OPTIME
TOPICAL | Status: DC | PRN
  Administered 2016-07-11: 5000 mL

## 2016-07-11 MED ORDER — METOPROLOL TARTRATE 25 MG/10 ML ORAL SUSPENSION: 12.5000 mg | Freq: Two times a day (BID) | ORAL | Status: DC

## 2016-07-11 MED ORDER — SODIUM CHLORIDE 0.9% FLUSH: 3.0000 mL | INTRAVENOUS | Status: DC | PRN

## 2016-07-11 MED ORDER — ARTIFICIAL TEARS OP OINT
TOPICAL_OINTMENT | OPHTHALMIC | Status: DC | PRN
  Administered 2016-07-11: 1 via OPHTHALMIC

## 2016-07-11 MED ORDER — PROTAMINE SULFATE 10 MG/ML IV SOLN
INTRAVENOUS | Status: DC | PRN
  Administered 2016-07-11: 250 mg via INTRAVENOUS

## 2016-07-11 MED ORDER — POTASSIUM CHLORIDE 10 MEQ/50ML IV SOLN
10.0000 meq | INTRAVENOUS | Status: AC
  Administered 2016-07-11 (×3): 10 meq via INTRAVENOUS

## 2016-07-11 MED ORDER — OXYCODONE HCL 5 MG PO TABS: 5.0000 mg | ORAL_TABLET | ORAL | Status: DC | PRN

## 2016-07-11 MED ORDER — MIDAZOLAM HCL 5 MG/5ML IJ SOLN
INTRAMUSCULAR | Status: DC | PRN
  Administered 2016-07-11: 3 mg via INTRAVENOUS
  Administered 2016-07-11 (×2): 1 mg via INTRAVENOUS

## 2016-07-11 MED ORDER — DEXTROSE 5 % IV SOLN
1.5000 g | Freq: Two times a day (BID) | INTRAVENOUS | Status: DC
  Administered 2016-07-11 – 2016-07-12 (×3): 1.5 g via INTRAVENOUS
  Filled 2016-07-11 (×4): qty 1.5

## 2016-07-11 MED ORDER — CHLORHEXIDINE GLUCONATE 0.12 % MT SOLN
15.0000 mL | OROMUCOSAL | Status: AC
  Administered 2016-07-11: 15 mL via OROMUCOSAL

## 2016-07-11 MED ORDER — SODIUM CHLORIDE 0.9 % IV SOLN
INTRAVENOUS | Status: DC | PRN
  Administered 2016-07-11: 12:00:00 via INTRAVENOUS

## 2016-07-11 MED ORDER — BUPIVACAINE HCL 0.5 % IJ SOLN
INTRAMUSCULAR | Status: DC | PRN
  Administered 2016-07-11: 10 mL

## 2016-07-11 MED ORDER — ROCURONIUM BROMIDE 10 MG/ML (PF) SYRINGE
PREFILLED_SYRINGE | INTRAVENOUS | Status: DC | PRN
  Administered 2016-07-11: 50 mg via INTRAVENOUS
  Administered 2016-07-11: 100 mg via INTRAVENOUS
  Administered 2016-07-11: 50 mg via INTRAVENOUS
  Administered 2016-07-11: 20 mg via INTRAVENOUS
  Administered 2016-07-11: 30 mg via INTRAVENOUS

## 2016-07-11 MED ORDER — ACETAMINOPHEN 650 MG RE SUPP
650.0000 mg | Freq: Once | RECTAL | Status: AC
  Administered 2016-07-11: 650 mg via RECTAL

## 2016-07-11 MED ORDER — FENTANYL CITRATE (PF) 250 MCG/5ML IJ SOLN
INTRAMUSCULAR | Status: AC
  Filled 2016-07-11: qty 25

## 2016-07-11 MED ORDER — INSULIN REGULAR BOLUS VIA INFUSION
0.0000 [IU] | Freq: Three times a day (TID) | INTRAVENOUS | Status: DC
  Filled 2016-07-11: qty 10

## 2016-07-11 MED ORDER — LACTATED RINGERS IV SOLN
INTRAVENOUS | Status: DC | PRN
  Administered 2016-07-11 (×2): via INTRAVENOUS

## 2016-07-11 MED ORDER — LIDOCAINE 2% (20 MG/ML) 5 ML SYRINGE
INTRAMUSCULAR | Status: DC | PRN
  Administered 2016-07-11: 60 mg via INTRAVENOUS

## 2016-07-11 MED ORDER — DOCUSATE SODIUM 100 MG PO CAPS
200.0000 mg | ORAL_CAPSULE | Freq: Every day | ORAL | Status: DC
  Administered 2016-07-12: 200 mg via ORAL
  Filled 2016-07-11: qty 2

## 2016-07-11 MED ORDER — MIDAZOLAM HCL 2 MG/2ML IJ SOLN: 2.0000 mg | INTRAMUSCULAR | Status: DC | PRN

## 2016-07-11 MED ORDER — SODIUM CHLORIDE 0.9 % IV SOLN: 250.0000 mL | INTRAVENOUS | Status: DC

## 2016-07-11 SURGICAL SUPPLY — 118 items
ADAPTER CARDIO PERF ANTE/RETRO (ADAPTER) ×3 IMPLANT
BAG DECANTER FOR FLEXI CONT (MISCELLANEOUS) ×3 IMPLANT
BLADE STERNUM SYSTEM 6 (BLADE) ×3 IMPLANT
BLADE SURG 11 STRL SS (BLADE) ×3 IMPLANT
CANISTER SUCTION 2500CC (MISCELLANEOUS) ×6 IMPLANT
CANNULA FEM VENOUS REMOTE 22FR (CANNULA) ×6 IMPLANT
CANNULA FEMORAL ART 14 SM (MISCELLANEOUS) ×6 IMPLANT
CANNULA GUNDRY RCSP 15FR (MISCELLANEOUS) ×3 IMPLANT
CANNULA OPTISITE PERFUSION 16F (CANNULA) ×6 IMPLANT
CANNULA OPTISITE PERFUSION 18F (CANNULA) IMPLANT
CANNULA SUMP PERICARDIAL (CANNULA) ×6 IMPLANT
CATH KIT ON Q 5IN SLV (PAIN MANAGEMENT) ×3 IMPLANT
CATH ROBINSON RED A/P 18FR (CATHETERS) ×3 IMPLANT
CONN ST 1/4X3/8  BEN (MISCELLANEOUS) ×2
CONN ST 1/4X3/8 BEN (MISCELLANEOUS) ×4 IMPLANT
CONNECTOR 1/2X3/8X1/2 3 WAY (MISCELLANEOUS) ×1
CONNECTOR 1/2X3/8X1/2 3WAY (MISCELLANEOUS) ×2 IMPLANT
CONT SPEC STER OR (MISCELLANEOUS) ×3 IMPLANT
COVER BACK TABLE 24X17X13 BIG (DRAPES) ×3 IMPLANT
COVER PROBE W GEL 5X96 (DRAPES) ×3 IMPLANT
CRADLE DONUT ADULT HEAD (MISCELLANEOUS) ×3 IMPLANT
DERMABOND ADVANCED (GAUZE/BANDAGES/DRESSINGS) ×2
DERMABOND ADVANCED .7 DNX12 (GAUZE/BANDAGES/DRESSINGS) ×4 IMPLANT
DEVICE PMI PUNCTURE CLOSURE (MISCELLANEOUS) ×3 IMPLANT
DEVICE TROCAR PUNCTURE CLOSURE (ENDOMECHANICALS) ×3 IMPLANT
DRAIN CHANNEL 28F RND 3/8 FF (WOUND CARE) ×6 IMPLANT
DRAPE BILATERAL SPLIT (DRAPES) ×3 IMPLANT
DRAPE C-ARM 42X72 X-RAY (DRAPES) ×3 IMPLANT
DRAPE CV SPLIT W-CLR ANES SCRN (DRAPES) ×3 IMPLANT
DRAPE INCISE IOBAN 66X45 STRL (DRAPES) ×6 IMPLANT
DRAPE PROXIMA HALF (DRAPES) ×3 IMPLANT
DRAPE SLUSH/WARMER DISC (DRAPES) ×3 IMPLANT
DRSG COVADERM 4X8 (GAUZE/BANDAGES/DRESSINGS) ×3 IMPLANT
ELECT BLADE 6.5 EXT (BLADE) ×3 IMPLANT
ELECT REM PT RETURN 9FT ADLT (ELECTROSURGICAL) ×6
ELECTRODE REM PT RTRN 9FT ADLT (ELECTROSURGICAL) ×4 IMPLANT
FEMORAL VENOUS CANN RAP (CANNULA) IMPLANT
GAUZE SPONGE 4X4 12PLY STRL (GAUZE/BANDAGES/DRESSINGS) ×3 IMPLANT
GLOVE BIOGEL PI IND STRL 6.5 (GLOVE) ×8 IMPLANT
GLOVE BIOGEL PI INDICATOR 6.5 (GLOVE) ×4
GLOVE ORTHO TXT STRL SZ7.5 (GLOVE) ×9 IMPLANT
GLOVE SS N UNI LF 6.5 STRL (GLOVE) ×15 IMPLANT
GLOVE SURG SS PI 6.5 STRL IVOR (GLOVE) ×3 IMPLANT
GOWN STRL REUS W/ TWL LRG LVL3 (GOWN DISPOSABLE) ×16 IMPLANT
GOWN STRL REUS W/TWL LRG LVL3 (GOWN DISPOSABLE) ×8
GRAFT PTCH CORMATRIX 7X10 4PLY (Prosthesis & Implant Heart) ×3 IMPLANT
INSERT CONFORM CROSS CLAMP 66M (MISCELLANEOUS) IMPLANT
INSERT CONFORM CROSS CLAMP 86M (MISCELLANEOUS) IMPLANT
KIT BASIN OR (CUSTOM PROCEDURE TRAY) ×3 IMPLANT
KIT DILATOR VASC 18G NDL (KITS) ×3 IMPLANT
KIT DRAINAGE VACCUM ASSIST (KITS) ×3 IMPLANT
KIT ROOM TURNOVER OR (KITS) ×3 IMPLANT
KIT SUCTION CATH 14FR (SUCTIONS) ×3 IMPLANT
LEAD PACING MYOCARDI (MISCELLANEOUS) ×3 IMPLANT
LINE VENT (MISCELLANEOUS) ×3 IMPLANT
NEEDLE AORTIC ROOT 14G 7F (CATHETERS) ×3 IMPLANT
NS IRRIG 1000ML POUR BTL (IV SOLUTION) ×15 IMPLANT
PACK OPEN HEART (CUSTOM PROCEDURE TRAY) ×3 IMPLANT
PAD ARMBOARD 7.5X6 YLW CONV (MISCELLANEOUS) ×6 IMPLANT
PAD ELECT DEFIB RADIOL ZOLL (MISCELLANEOUS) ×3 IMPLANT
RETRACTOR TRL SOFT TISSUE LG (INSTRUMENTS) IMPLANT
RETRACTOR TRM SOFT TISSUE 7.5 (INSTRUMENTS) IMPLANT
SET CANNULATION TOURNIQUET (MISCELLANEOUS) ×3 IMPLANT
SET CARDIOPLEGIA MPS 5001102 (MISCELLANEOUS) ×3 IMPLANT
SET IRRIG TUBING LAPAROSCOPIC (IRRIGATION / IRRIGATOR) ×3 IMPLANT
SOLUTION ANTI FOG 6CC (MISCELLANEOUS) ×3 IMPLANT
SPONGE GAUZE 4X4 12PLY STER LF (GAUZE/BANDAGES/DRESSINGS) ×3 IMPLANT
SUT BONE WAX W31G (SUTURE) ×3 IMPLANT
SUT E-PACK MINIMALLY INVASIVE (SUTURE) ×3 IMPLANT
SUT ETHIBOND (SUTURE) IMPLANT
SUT ETHIBOND 2 0 SH (SUTURE) IMPLANT
SUT ETHIBOND 2 0 V4 (SUTURE) IMPLANT
SUT ETHIBOND 2 0V4 GREEN (SUTURE) IMPLANT
SUT ETHIBOND 2-0 RB-1 WHT (SUTURE) IMPLANT
SUT ETHIBOND 4 0 TF (SUTURE) IMPLANT
SUT ETHIBOND 5 0 C 1 30 (SUTURE) IMPLANT
SUT ETHIBOND NAB MH 2-0 36IN (SUTURE) IMPLANT
SUT ETHIBOND X763 2 0 SH 1 (SUTURE) ×3 IMPLANT
SUT GORETEX 6.0 TH-9 30 IN (SUTURE) IMPLANT
SUT GORETEX CV 4 TH 22 36 (SUTURE) ×3 IMPLANT
SUT GORETEX CV-5THC-13 36IN (SUTURE) IMPLANT
SUT GORETEX CV4 TH-18 (SUTURE) ×6 IMPLANT
SUT GORETEX TH-18 36 INCH (SUTURE) IMPLANT
SUT MNCRL AB 3-0 PS2 18 (SUTURE) IMPLANT
SUT PROLENE 3 0 SH DA (SUTURE) IMPLANT
SUT PROLENE 3 0 SH1 36 (SUTURE) ×24 IMPLANT
SUT PROLENE 4 0 RB 1 (SUTURE) ×2
SUT PROLENE 4-0 RB1 .5 CRCL 36 (SUTURE) ×4 IMPLANT
SUT PROLENE 5 0 C 1 36 (SUTURE) IMPLANT
SUT PROLENE 6 0 C 1 30 (SUTURE) IMPLANT
SUT SILK  1 MH (SUTURE)
SUT SILK 1 MH (SUTURE) IMPLANT
SUT SILK 1 TIES 10X30 (SUTURE) IMPLANT
SUT SILK 2 0 SH CR/8 (SUTURE) IMPLANT
SUT SILK 2 0 TIES 10X30 (SUTURE) IMPLANT
SUT SILK 2 0SH CR/8 30 (SUTURE) IMPLANT
SUT SILK 3 0 (SUTURE)
SUT SILK 3 0 SH CR/8 (SUTURE) IMPLANT
SUT SILK 3 0SH CR/8 30 (SUTURE) IMPLANT
SUT SILK 3-0 18XBRD TIE 12 (SUTURE) IMPLANT
SUT TEM PAC WIRE 2 0 SH (SUTURE) IMPLANT
SUT VIC AB 2-0 CTX 36 (SUTURE) IMPLANT
SUT VIC AB 2-0 UR6 27 (SUTURE) IMPLANT
SUT VIC AB 3-0 SH 8-18 (SUTURE) IMPLANT
SUT VICRYL 2 TP 1 (SUTURE) IMPLANT
SYRINGE 10CC LL (SYRINGE) ×3 IMPLANT
SYSTEM SAHARA CHEST DRAIN ATS (WOUND CARE) ×3 IMPLANT
TAPE CLOTH SURG 4X10 WHT LF (GAUZE/BANDAGES/DRESSINGS) ×3 IMPLANT
TAPE PAPER 2X10 WHT MICROPORE (GAUZE/BANDAGES/DRESSINGS) ×3 IMPLANT
TOWEL OR 17X24 6PK STRL BLUE (TOWEL DISPOSABLE) ×6 IMPLANT
TOWEL OR 17X26 10 PK STRL BLUE (TOWEL DISPOSABLE) ×6 IMPLANT
TROCAR XCEL BLADELESS 5X75MML (TROCAR) ×3 IMPLANT
TROCAR XCEL NON-BLD 11X100MML (ENDOMECHANICALS) ×6 IMPLANT
TUBE SUCT INTRACARD DLP 20F (MISCELLANEOUS) ×3 IMPLANT
TUNNELER SHEATH ON-Q 11GX8 DSP (PAIN MANAGEMENT) ×3 IMPLANT
UNDERPAD 30X30 (UNDERPADS AND DIAPERS) ×3 IMPLANT
WATER STERILE IRR 1000ML POUR (IV SOLUTION) ×6 IMPLANT
WIRE J 3MM .035X145CM (WIRE) ×3 IMPLANT

## 2016-07-11 NOTE — Brief Op Note (Signed)
07/11/2016  12:28 PM  PATIENT:  Emily Joseph  45 y.o. female  PRE-OPERATIVE DIAGNOSIS:  RIGHT ATRIAL MASS  POST-OPERATIVE DIAGNOSIS:  * No post-op diagnosis entered *  PROCEDURE:  Procedure(s): MINIMALLY INVASIVE RESECTION OF RIGHT ATRIAL LIPOMA WITH CLOSURE OF PATENT FORAMEN OVALE (Right) TRANSESOPHAGEAL ECHOCARDIOGRAM (TEE) (N/A)  SURGEON:    Rexene Alberts, MD  ASSISTANTS:  Nicholes Rough, PA-C  ANESTHESIA:   Jillyn Hidden, MD  CROSSCLAMP TIME:   79'  CARDIOPULMONARY BYPASS TIME: 100'  FINDINGS:  Large benign appearing mass in right atrium eminating from interatrial groove along acute margin  Preliminary histology c/w benign lipoma  Patent foramen ovale  Normal LV and RV function  Mild-moderate tricuspid regurgitation  COMPLICATIONS: None  BASELINE WEIGHT: 61 kg  PATIENT DISPOSITION:   TO SICU IN STABLE CONDITION  Rexene Alberts, MD 07/11/2016 12:28 PM

## 2016-07-11 NOTE — Progress Notes (Signed)
  Echocardiogram Echocardiogram Transesophageal has been performed.  Emily Joseph 07/11/2016, 8:58 AM

## 2016-07-11 NOTE — Transfer of Care (Addendum)
Immediate Anesthesia Transfer of Care Note  Patient: Emily Joseph  Procedure(s) Performed: Procedure(s): MINIMALLY INVASIVE RESECTION OF RIGHT ATRIAL LIPOMA WITH CLOSURE OF PATENT FORAMEN OVALE (Right) TRANSESOPHAGEAL ECHOCARDIOGRAM (TEE) (N/A)  Patient Location: SICU  Anesthesia Type:General  Level of Consciousness: sedated and Patient remains intubated per anesthesia plan  Airway & Oxygen Therapy: Patient remains intubated per anesthesia plan and Patient placed on Ventilator (see vital sign flow sheet for setting)  Post-op Assessment: Report given to RN and Post -op Vital signs reviewed and stable  Post vital signs: Reviewed and stable  Last Vitals:  Vitals:   07/11/16 0605 07/11/16 0614  BP: 108/73 122/85  Pulse: 94 92  Resp: 18   Temp: 36.8 C     Last Pain:  Vitals:   07/11/16 0605  TempSrc: Oral  PainSc:          Complications: No apparent anesthesia complications

## 2016-07-11 NOTE — Progress Notes (Signed)
Pt weaned on rate of 4 and 40% for 20 minutes. Then weaned in pressure support 10/5 for 20 minutes. ABG was within appropriate parameters. FVC was 1.2L and NIF was 32. Pt extubated to 4L nasal cannula without complication.

## 2016-07-11 NOTE — Anesthesia Procedure Notes (Addendum)
Procedure Name: Intubation Date/Time: 07/11/2016 7:50 AM Performed by: Jacquiline Doe A Pre-anesthesia Checklist: Patient identified, Emergency Drugs available, Suction available and Patient being monitored Patient Re-evaluated:Patient Re-evaluated prior to inductionOxygen Delivery Method: Gate and Circle system utilized Preoxygenation: Pre-oxygenation with 100% oxygen Intubation Type: IV induction and Cricoid Pressure applied Ventilation: Mask ventilation without difficulty Laryngoscope Size: Mac and 3 Grade View: Grade I Tube type: Oral Endobronchial tube: Double lumen EBT, EBT position confirmed by fiberoptic bronchoscope, EBT position confirmed by auscultation and Left and 37 Fr Number of attempts: 1 Airway Equipment and Method: Stylet and Fiberoptic brochoscope Placement Confirmation: ETT inserted through vocal cords under direct vision,  positive ETCO2 and breath sounds checked- equal and bilateral Secured at: 29 cm Tube secured with: Tape Dental Injury: Teeth and Oropharynx as per pre-operative assessment  Comments: Intubation by Shaune Pollack, SRNA

## 2016-07-11 NOTE — Anesthesia Procedure Notes (Addendum)
Central Venous Catheter Insertion Performed by: anesthesiologist Patient location: Pre-op. Preanesthetic checklist: patient identified, IV checked, site marked, risks and benefits discussed, surgical consent, monitors and equipment checked, pre-op evaluation, timeout performed and anesthesia consent Lidocaine 1% used for infiltration Landmarks identified Catheter size: 9 Fr MAC introducer Procedure performed using ultrasound guided technique. Attempts: 1 Following insertion, line sutured and dressing applied. Post procedure assessment: blood return through all ports, free fluid flow and no air. Patient tolerated the procedure well with no immediate complications.

## 2016-07-11 NOTE — Anesthesia Postprocedure Evaluation (Signed)
Anesthesia Post Note  Patient: Emily Joseph  Procedure(s) Performed: Procedure(s) (LRB): MINIMALLY INVASIVE RESECTION OF RIGHT ATRIAL LIPOMA WITH CLOSURE OF PATENT FORAMEN OVALE (Right) TRANSESOPHAGEAL ECHOCARDIOGRAM (TEE) (N/A)  Patient location during evaluation: SICU Anesthesia Type: General Level of consciousness: sedated Pain management: pain level controlled Vital Signs Assessment: post-procedure vital signs reviewed and stable Respiratory status: patient on ventilator - see flowsheet for VS and patient remains intubated per anesthesia plan Cardiovascular status: blood pressure returned to baseline and stable Postop Assessment: no signs of nausea or vomiting Anesthetic complications: no    Last Vitals:  Vitals:   07/11/16 0614 07/11/16 1300  BP: 122/85 101/64  Pulse: 92 88  Resp:  12  Temp:      Last Pain:  Vitals:   07/11/16 0605  TempSrc: Oral  PainSc:                  Zenaida Deed

## 2016-07-11 NOTE — Progress Notes (Signed)
Patient ID: Emily Joseph, female   DOB: Jun 03, 1971, 45 y.o.   MRN: [id number] EVENING ROUNDS NOTE :     [address]       [place] [address]             [phone]                 Day of Surgery Procedure(s) (LRB): MINIMALLY INVASIVE RESECTION OF RIGHT ATRIAL LIPOMA WITH CLOSURE OF PATENT FORAMEN OVALE (Right) TRANSESOPHAGEAL ECHOCARDIOGRAM (TEE) (N/A)  Total Length of Stay:  LOS: 0 days  BP 103/60   Pulse 78   Temp 97 F (36.1 C) (Axillary)   Resp 13   Ht 5' 7.75" (1.721 m)   Wt 134 lb 5 oz (60.9 kg)   LMP 05/20/2014   SpO2 100%   BMI 20.57 kg/m   .Intake/Output      08/31 0701 - 09/01 0700 09/01 0701 - 09/02 0700   I.V. (mL/kg)  3305.7 (54.3)   Blood  226   IV Piggyback  750   Total Intake(mL/kg)  4281.7 (70.3)   Urine (mL/kg/hr)  3130 (4.7)   Blood  750 (1.1)   Chest Tube  150 (0.2)   Total Output   4030   Net   +251.7          . sodium chloride 20 mL/hr at 07/11/16 1700  . sodium chloride 100 mL/hr at 07/11/16 1700  . [START ON 07/12/2016] sodium chloride    . sodium chloride    . dexmedetomidine Stopped (07/11/16 1315)  . insulin (NOVOLIN-R) infusion 2.6 Units/hr (07/11/16 1600)  . lactated ringers    . lactated ringers    . nitroGLYCERIN    . phenylephrine (NEO-SYNEPHRINE) Adult infusion 5 mcg/min (07/11/16 1700)     Lab Results  Component Value Date   WBC 20.8 (H) 07/11/2016   HGB 11.0 (L) 07/11/2016   HCT 32.5 (L) 07/11/2016   PLT 177 07/11/2016   GLUCOSE 110 (H) 07/11/2016   ALT 16 07/09/2016   AST 17 07/09/2016   NA 142 07/11/2016   K 3.3 (L) 07/11/2016   CL 102 07/11/2016   CREATININE 0.20 (L) 07/11/2016   BUN 6 07/11/2016   CO2 20 (L) 07/09/2016   TSH 0.82 03/04/2016   INR 1.49 07/11/2016   HGBA1C 5.1 07/09/2016   Now extubated, neuro intact Not bleeding  [name] MD  Beeper [phone] Office [phone] 07/11/2016 5:56 PM

## 2016-07-11 NOTE — Interval H&P Note (Signed)
History and Physical Interval Note:  07/11/2016 5:32 AM  Emily Joseph  has presented today for surgery, with the diagnosis of RIGHT ATRIAL MASS  The various methods of treatment have been discussed with the patient and family. After consideration of risks, benefits and other options for treatment, the patient has consented to  Procedure(s): MINIMALLY INVASIVE RESECTION OF RIGHT ATRIAL MYXOMA (Right) TRANSESOPHAGEAL ECHOCARDIOGRAM (TEE) (N/A) as a surgical intervention .  The patient's history has been reviewed, patient examined, no change in status, stable for surgery.  I have reviewed the patient's chart and labs.  Questions were answered to the patient's satisfaction.     Rexene Alberts

## 2016-07-11 NOTE — Op Note (Signed)
CARDIOTHORACIC SURGERY OPERATIVE NOTE  Date of Procedure:  07/11/2016  Preoperative Diagnosis:   Right Atrial Mass  Postoperative Diagnosis:   Right Atrial Lipoma  Patent Foramen Ovale  Procedure:    Minimally-Invasive Resection of Right Atrial Lipoma  Autologous pericardial patch reconstruction of AV groove    Closure of Patent Foramen Ovale   Surgeon: Valentina Gu. Roxy Manns, MD  Assistant: Nicholes Rough, PA-C  Anesthesia: Jillyn Hidden, MD  Operative Findings:  Large benign appearing mass in right atrium eminating from interatrial groove along acute margin  Preliminary histology c/w benign lipoma  Patent foramen ovale  Normal LV and RV function  Mild-moderate tricuspid regurgitation                     BRIEF CLINICAL NOTE AND INDICATIONS FOR SURGERY  Patient is a 45 year old female with history of iron deficient anemia, eosinophilic esophagitis, and fibromyalgia who was recently referred to Dr. Stanford Breed for cardiology consultation because of a long progressive history of exertional shortness of breath, fatigue, palpitations and atypical chest discomfort. 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 The patient was referred for surgical consultation.  The patient has been seen in consultation and counseled at length regarding the indications, risks and potential benefits of surgery.  All questions have been answered, and the patient provides full informed consent for the operation as described.    DETAILS OF THE OPERATIVE PROCEDURE  Preparation:  The patient is brought to the operating room on the above mentioned date and central monitoring was established by the anesthesia team including placement of Swan-Ganz catheter through the left internal jugular vein.  A radial arterial line is placed. The patient is placed in the supine position on the operating  table.  Intravenous antibiotics are administered. General endotracheal anesthesia is induced uneventfully. The patient is initially intubated using a dual lumen endotracheal tube.  A Foley catheter is placed.  Baseline transesophageal echocardiogram was performed.  Findings were notable for a large mass in the right atrium. The mass appeared to be attached to the anterolateral wall of the atrium adjacent to the anterior and lateral borders of the tricuspid annulus. The mass did not appear to be adherent to the tricuspid valve itself and the tricuspid valve was functioning well with mild tricuspid regurgitation. The mass was fairly mobile and moved to and fro within the tricuspid annulus. There was a patent foramen ovale with left-to-right shunt. Left ventricular function was mildly reduced. There were no regional wall motion abnormalities. The mitral valve and aortic valve appeared normal.  A soft roll is placed behind the patient's left scapula and the neck gently extended and turned to the left.   The patient's right neck, chest, abdomen, both groins, and both lower extremities are prepared and draped in a sterile manner. A time out procedure is performed.  Surgical Approach:  A right miniature anterolateral thoracotomy incision is performed. The incision is placed just lateral to and superior to the right nipple. The pectoralis major muscle is retracted medially and completely preserved. The right pleural space is entered through the 3rd intercostal space. A soft tissue retractor is placed.  Two 11 mm ports are placed through separate stab incisions inferiorly. The right pleural space is insufflated continuously with carbon dioxide gas through the posterior port during the remainder of the operation.  A longitudinal incision is made in the pericardium 3 cm anterior to the phrenic nerve  and silk traction sutures are placed on either side of the incision for exposure.   Extracorporeal Cardiopulmonary  Bypass and Myocardial Protection:  A small incision is made in the right inguinal crease and the anterior surface of the right common femoral artery and right common femoral vein are identified.  The patient is placed in Trendelenburg position. The right internal jugular vein is cannulated with Seldinger technique and a guidewire advanced into the right atrium. The patient is heparinized systemically. The right internal jugular vein is cannulated with a 14 [place] pediatric femoral venous cannula. Pursestring sutures are placed on the anterior surface of the right common femoral vein and right common femoral artery. The right common femoral vein is cannulated with the Seldinger technique and a guidewire is advanced under transesophageal echocardiogram guidance into the right atrium. The femoral vein is cannulated with a long 22 French femoral venous cannula. The right common femoral artery is cannulated with Seldinger technique and a flexible guidewire is advanced until it can be appreciated intraluminally in the descending thoracic aorta on transesophageal echocardiogram. The femoral artery is cannulated with an 18 French femoral arterial cannula.  Adequate heparinization is verified.     The entire pre-bypass portion of the operation was notable for stable hemodynamics.  Cardiopulmonary bypass was begun.  Vacuum assist venous drainage is utilized. The incision in the pericardium is extended in both directions. Venous drainage and exposure are notably excellent. A generous portion of the patient's pericardium is harvested for subsequent patch reconstruction as needed. The pericardial patch was soaked in 0.625% glutaraldehyde solution for 3 minutes and subsequently rinsed in consecutive baths of saline.  Umbilical tapes are placed around the superior vena cava and the inferior vena cava.  An antegrade cardioplegia cannula is placed in the ascending aorta.    The patient is allowed to cool passively to [facility]  systemic temperature.  The aortic cross clamp is applied and cold blood cardioplegia is delivered initially in an antegrade fashion through the aortic root.  The initial cardioplegic arrest is rapid with early diastolic arrest.  Repeat doses of cardioplegia are administered intermittently every 20 to 30 minutes throughout the entire cross clamp portion of the operation through the aortic root in order to maintain completely flat electrocardiogram.  Myocardial protection was felt to be excellent.   Resection of Right Atrial Mass:  An oblique right atriotomy incision is performed after snaring the umbilical tapes around the superior and inferior vena cava. Through the incision one can easily appreciate a mass within the right atrium. The incision is extended anteriorly and inferiorly towards the acute margin of the heart with care taken to stay away from the attachment of the mass to the anterolateral wall of the right atrium. As the incision is extended one can appreciate the gross anatomical characteristics of the mass which is benign-appearing, somewhat lobulated, and well-circumscribed, consistent with a benign lipoma. The mass is adherent to the anterolateral surface of the right atrium as it approaches the acute margin of the heart. The incision is extended away from this region to facilitate complete resection of the mass with the adjacent myocardium. Several interrupted pledgeted 3-0 Prolene sutures are placed through the atrial wall and used for retraction to facilitate exposure. Once the atriotomy is extended enough sharp excision of the mass is performed incorporating the wall of the right atrium where the mass is adherent. This continues into the AV groove which is contiguous with the mass itself. Excision is continued tangentially to avoid getting  deep into the AV groove where the right coronary artery is located. The undersurface of the mass is inspected and comes close to the annulus of the  tricuspid valve. Care is taken to avoid extending the incision through the tricuspid annulus. Ultimately the mass is completely resected. The mass is sent to pathology where preliminary histology is found to be consistent with fatty tissue consistent with likely benign lipoma.  Once the mass is been resected the defect in the atrial wall is carefully inspected. It comes up to the tricuspid annulus between the 11:00 and 2:00 position on the valve. The valve itself appears intact. Patch reconstruction of the AV groove and right atrial wall is felt indicated to avoid potential for damage to the tricuspid valve and/or suture plication of the right coronary artery.   Closure of Patent Foramen Ovale:  The floor of the fossa ovalis is inspected. There is no atrial septal defect but there is a large patent foramen ovale which can easily be probed. A patent foramen ovale is closed using a 2 layer closure of running 4-0 Prolene suture.   Patch Reconstruction of the AV Groove and Right Atrial Wall:  The patient's autologous pericardium is trimmed to an elliptical shape and utilized to reconstruct the AV groove and right atrial wall. The patch is sewn in place using a 2 layer closure of running 3-0 Prolene suture. Once the defect in the AV groove has been covered the tricuspid valve is again inspected and appears to be functioning normally with no sign of leaflet restriction.   Rewarming is begun. One final dose of warm "hot shot" cardioplegia was administered through the aortic root.  The aortic cross clamp was removed after a total cross clamp time of 55 minutes.   Procedure Completion:  The remainder of the right atriotomy incision is closed using a 2 layer closure of running 3-0 Prolene suture.  Epicardial pacing wires are fixed to the inferior wall of the right ventricule and to the right atrial appendage. The patient is rewarmed to 37C temperature.  The antegrade cardioplegia cannula is removed. The  patient is weaned and disconnected from cardiopulmonary bypass.  The patient's rhythm at separation from bypass was sinus.  The patient was weaned from bypass without any inotropic support. Total cardiopulmonary bypass time for the operation was 100 minutes.  Followup transesophageal echocardiogram performed after separation from bypass revealed moderate (2+) tricuspid regurgitation. There was no residual communication across the interatrial septum.  Left and right ventricular function were unchanged from preoperatively.   The femoral arterial and venous cannulae were removed uneventfully. There was a palpable pulse in the distal right common femoral artery after removal of the cannula. Protamine was administered to reverse the anticoagulation. The right internal jugular cannula was removed and manual pressure held on the neck for 15 minutes.  Single lung ventilation was begun. The atriotomy closure was inspected for hemostasis. The pericardial sac was drained using a 28 French Bard drain placed through the anterior port incision.  The pericardium was closed using a patch of core matrix bovine submucosal tissue patch. The right pleural space is irrigated with saline solution and inspected for hemostasis. The right pleural space was drained using a 28 French Bard drain placed through the posterior port incision. The miniature thoracotomy incision was closed in multiple layers in routine fashion. The right groin incision was inspected for hemostasis and closed in multiple layers in routine fashion.  The post-bypass portion of the operation was notable for stable  rhythm and hemodynamics.  No blood products were administered during the operation.   Disposition:  The patient tolerated the procedure well.  The patient was reintubated using a single lumen endotracheal tube and subsequently transported to the surgical intensive care unit in stable condition. There were no intraoperative complications. All  sponge instrument and needle counts are verified correct at completion of the operation.     Valentina Gu. Roxy Manns MD 07/11/2016 12:35 PM

## 2016-07-12 ENCOUNTER — Inpatient Hospital Stay (HOSPITAL_COMMUNITY): Payer: 59

## 2016-07-12 LAB — GLUCOSE, CAPILLARY
GLUCOSE-CAPILLARY: 103 mg/dL — AB (ref 65–99)
GLUCOSE-CAPILLARY: 114 mg/dL — AB (ref 65–99)
GLUCOSE-CAPILLARY: 119 mg/dL — AB (ref 65–99)
GLUCOSE-CAPILLARY: 159 mg/dL — AB (ref 65–99)
GLUCOSE-CAPILLARY: 89 mg/dL (ref 65–99)
Glucose-Capillary: 103 mg/dL — ABNORMAL HIGH (ref 65–99)
Glucose-Capillary: 134 mg/dL — ABNORMAL HIGH (ref 65–99)
Glucose-Capillary: 149 mg/dL — ABNORMAL HIGH (ref 65–99)
Glucose-Capillary: 166 mg/dL — ABNORMAL HIGH (ref 65–99)
Glucose-Capillary: 93 mg/dL (ref 65–99)
Glucose-Capillary: 94 mg/dL (ref 65–99)

## 2016-07-12 LAB — POCT I-STAT, CHEM 8
BUN: 4 mg/dL — AB (ref 6–20)
CHLORIDE: 97 mmol/L — AB (ref 101–111)
Calcium, Ion: 1.2 mmol/L (ref 1.15–1.40)
Creatinine, Ser: 0.5 mg/dL (ref 0.44–1.00)
Glucose, Bld: 138 mg/dL — ABNORMAL HIGH (ref 65–99)
HEMATOCRIT: 28 % — AB (ref 36.0–46.0)
Hemoglobin: 9.5 g/dL — ABNORMAL LOW (ref 12.0–15.0)
Potassium: 4.1 mmol/L (ref 3.5–5.1)
SODIUM: 134 mmol/L — AB (ref 135–145)
TCO2: 25 mmol/L (ref 0–100)

## 2016-07-12 LAB — CBC
HCT: 30.4 % — ABNORMAL LOW (ref 36.0–46.0)
HEMATOCRIT: 28.6 % — AB (ref 36.0–46.0)
HEMOGLOBIN: 9.6 g/dL — AB (ref 12.0–15.0)
HEMOGLOBIN: 10 g/dL — AB (ref 12.0–15.0)
MCH: 30 pg (ref 26.0–34.0)
MCH: 29.6 pg (ref 26.0–34.0)
MCHC: 33.6 g/dL (ref 30.0–36.0)
MCHC: 32.9 g/dL (ref 30.0–36.0)
MCV: 89.4 fL (ref 78.0–100.0)
MCV: 89.9 fL (ref 78.0–100.0)
PLATELETS: 160 10*3/uL (ref 150–400)
Platelets: 154 10*3/uL (ref 150–400)
RBC: 3.2 MIL/uL — ABNORMAL LOW (ref 3.87–5.11)
RBC: 3.38 MIL/uL — ABNORMAL LOW (ref 3.87–5.11)
RDW: 12.6 % (ref 11.5–15.5)
RDW: 12.8 % (ref 11.5–15.5)
WBC: 17.6 10*3/uL — ABNORMAL HIGH (ref 4.0–10.5)
WBC: 18 10*3/uL — ABNORMAL HIGH (ref 4.0–10.5)

## 2016-07-12 LAB — MAGNESIUM
Magnesium: 2.4 mg/dL (ref 1.7–2.4)
Magnesium: 2.2 mg/dL (ref 1.7–2.4)

## 2016-07-12 LAB — BASIC METABOLIC PANEL
Anion gap: 5 (ref 5–15)
CHLORIDE: 103 mmol/L (ref 101–111)
CO2: 23 mmol/L (ref 22–32)
CREATININE: 0.46 mg/dL (ref 0.44–1.00)
Calcium: 7.5 mg/dL — ABNORMAL LOW (ref 8.9–10.3)
GFR calc non Af Amer: >60 mL/min (ref >60)
GLUCOSE: 92 mg/dL (ref 65–99)
Potassium: 4.1 mmol/L (ref 3.5–5.1)
Sodium: 131 mmol/L — ABNORMAL LOW (ref 135–145)

## 2016-07-12 LAB — CREATININE, SERUM
CREATININE: 0.54 mg/dL (ref 0.44–1.00)
GFR calc Af Amer: >60 mL/min (ref >60)
GFR calc non Af Amer: >60 mL/min (ref >60)

## 2016-07-12 MED ORDER — INSULIN ASPART 100 UNIT/ML ~~LOC~~ SOLN
0.0000 [IU] | SUBCUTANEOUS | Status: DC
  Administered 2016-07-12 – 2016-07-13 (×4): 2 [IU] via SUBCUTANEOUS

## 2016-07-12 NOTE — Progress Notes (Signed)
Patient ID: Emily Joseph, female   DOB: September 25, 1971, 45 y.o.   MRN: [id number] TCTS DAILY ICU PROGRESS NOTE                   [address]            [place] [address]          [id number]   1 Day Post-Op Procedure(s) (LRB): MINIMALLY INVASIVE RESECTION OF RIGHT ATRIAL LIPOMA WITH CLOSURE OF PATENT FORAMEN OVALE (Right) TRANSESOPHAGEAL ECHOCARDIOGRAM (TEE) (N/A)  Total Length of Stay:  LOS: 1 day   Subjective: Up in chair, sleepy but neuro inatct  Objective: Vital signs in last 24 hours: Temp:  [96.3 F (35.7 C)-98.2 F (36.8 C)] 98.2 F (36.8 C) (09/02 0745) Pulse Rate:  [78-94] 86 (09/02 0700) Resp:  [6-23] 23 (09/02 0700) BP: (83-107)/(50-70) 104/64 (09/02 0700) SpO2:  [100 %] 100 % (09/02 0700) Arterial Line BP: (80-216)/(49-210) 121/63 (09/02 0700) FiO2 (%):  [40 %-50 %] 40 % (09/01 1528) Weight:  [141 lb 12.8 oz (64.3 kg)] 141 lb 12.8 oz (64.3 kg) (09/02 0545)  Filed Weights   07/11/16 0613 07/12/16 0545  Weight: 134 lb 5 oz (60.9 kg) 141 lb 12.8 oz (64.3 kg)    Weight change: 7 lb 7.8 oz (3.396 kg)   Hemodynamic parameters for last 24 hours: CVP:  [4 mmHg-16 mmHg] 5 mmHg  Intake/Output from previous day: 09/01 0701 - 09/02 0700 In: 5659.6 [P.O.:50; I.V.:4233.6; Blood:226; IV Piggyback:1150] Out: 5505 [Urine:4405; Blood:750; Chest Tube:350]  Intake/Output this shift: No intake/output data recorded.  Current Meds: Scheduled Meds: . acetaminophen  1,000 mg Oral Q6H   Or  . acetaminophen (TYLENOL) oral liquid 160 mg/5 mL  1,000 mg Per Tube Q6H  . aspirin EC  325 mg Oral Daily   Or  . aspirin  324 mg Per Tube Daily  . bisacodyl  10 mg Oral Daily   Or  . bisacodyl  10 mg Rectal Daily  . cefUROXime (ZINACEF)  IV  1.5 g Intravenous Q12H  . docusate sodium  200 mg Oral Daily  . famotidine (PEPCID) IV  20 mg Intravenous Q12H  . insulin aspart  0-24 Units Subcutaneous Q4H  . metoprolol tartrate  12.5 mg Oral BID   Or  . metoprolol  tartrate  12.5 mg Per Tube BID  . [START ON 07/13/2016] pantoprazole  40 mg Oral Daily  . sodium chloride flush  3 mL Intravenous Q12H   Continuous Infusions: . sodium chloride 20 mL/hr at 07/11/16 1900  . dexmedetomidine Stopped (07/12/16 0500)  . lactated ringers    . lactated ringers    . nitroGLYCERIN    . phenylephrine (NEO-SYNEPHRINE) Adult infusion Stopped (07/11/16 2300)   PRN Meds:.sodium chloride, albumin human, lactated ringers, metoprolol, midazolam, morphine injection, ondansetron (ZOFRAN) IV, oxyCODONE, sodium chloride flush, traMADol  General appearance: alert, cooperative and no distress Neurologic: intact Heart: regular rate and rhythm, S1, S2 normal, no murmur, click, rub or gallop Lungs: diminished breath sounds bibasilar Abdomen: soft, non-tender; bowel sounds normal; no masses,  no organomegaly Extremities: extremities normal, atraumatic, no cyanosis or edema and Homans sign is negative, no sign of DVT Wound: 180 from chest tubes past 12 hours   Lab Results: CBC: Recent Labs  07/11/16 1845 07/11/16 1852 07/12/16 0300  WBC 23.7*  --  17.6*  HGB 10.1* 9.5* 9.6*  HCT 30.3* 28.0* 28.6*  PLT 174  --  154   BMET:  Recent Labs  07/09/16 1342  07/11/16 1852 07/12/16 0300  NA 136  < > 137 131*  K 3.7  < > 4.1 4.1  CL 104  < > 105 103  CO2 20*  --   --  23  GLUCOSE 97  < > 125* 92  BUN 14  < > 4* <5*  CREATININE 0.72  < > 0.30* 0.46  CALCIUM 9.4  --   --  7.5*  < > = values in this interval not displayed.  CMET: Lab Results  Component Value Date   WBC 17.6 (H) 07/12/2016   HGB 9.6 (L) 07/12/2016   HCT 28.6 (L) 07/12/2016   PLT 154 07/12/2016   GLUCOSE 92 07/12/2016   ALT 16 07/09/2016   AST 17 07/09/2016   NA 131 (L) 07/12/2016   K 4.1 07/12/2016   CL 103 07/12/2016   CREATININE 0.46 07/12/2016   BUN <5 (L) 07/12/2016   CO2 23 07/12/2016   TSH 0.82 03/04/2016   INR 1.49 07/11/2016   HGBA1C 5.1 07/09/2016    PT/INR:  Recent Labs   07/11/16 1313  LABPROT 18.1*  INR 1.49   Radiology: Dg Chest Port 1 View  Result Date: 07/12/2016 CLINICAL DATA:  Status post surgical resection of a right atrial lipoma. EXAM: PORTABLE CHEST 1 VIEW COMPARISON:  07/11/2016 FINDINGS: The endotracheal tube has been removed. The right-sided chest tube is stable. No definite pneumothorax. The NG tube has been removed. Stable left IJ catheter. Stable epicardial pacer wires. Heart is normal in size. The lungs are clear except for minimal areas of atelectasis. IMPRESSION: Interval removal of the endotracheal tube and NG tube. Stable right-sided chest. No definite pneumothorax. Minimal streaky areas of atelectasis but no infiltrates edema or effusions. Electronically Signed   By: Emily Joseph M.D.   On: 07/12/2016 07:32   Dg Chest Port 1 View  Result Date: 07/11/2016 CLINICAL DATA:  Hypoxia EXAM: PORTABLE CHEST 1 VIEW COMPARISON:  July 09, 2016 FINDINGS: Endotracheal tube tip is 3.8 cm above the carina. Nasogastric tube tip and side port below the diaphragm. There is a chest tube on the right. Temporary pacemaker wires are attached to the right heart. There is no evident pneumothorax. Lungs are clear. Heart size and pulmonary vascularity are normal. No adenopathy. No evident bone lesions. IMPRESSION: Tube and catheter positions as described without pneumothorax. No edema or consolidation. Electronically Signed   By: Emily Joseph III M.D.   On: 07/11/2016 13:44     Assessment/Plan: S/P Procedure(s) (LRB): MINIMALLY INVASIVE RESECTION OF RIGHT ATRIAL LIPOMA WITH CLOSURE OF PATENT FORAMEN OVALE (Right) TRANSESOPHAGEAL ECHOCARDIOGRAM (TEE) (N/A) Mobilize d/c tubes/lines Continue foley due to strict I&O and urinary output monitoring D/c aline and central line placed through cordis  Expected Acute  Blood - loss Anemia    Emily Joseph 07/12/2016 7:48 AM

## 2016-07-12 NOTE — Plan of Care (Signed)
Problem: Pain Management: Goal: Pain level will decrease Outcome: Not Progressing Very difficult to controls pain tonight, multiple interventions tried, pt anxious about taking pain medications

## 2016-07-12 NOTE — Plan of Care (Signed)
Problem: Coping: Goal: Ability to adjust to condition or change in health will improve Outcome: Progressing Progressing slowly, pt very apprehensive due to other circumstances around other procedures.  States she is "nervous" and is reluctant to move, perform IS.

## 2016-07-12 NOTE — Plan of Care (Signed)
Problem: Bowel/Gastric: Goal: Will not experience complications related to bowel motility Outcome: Not Progressing Last BM several days prior to admission

## 2016-07-13 ENCOUNTER — Inpatient Hospital Stay (HOSPITAL_COMMUNITY): Payer: 59

## 2016-07-13 LAB — BASIC METABOLIC PANEL
Anion gap: 3 — ABNORMAL LOW (ref 5–15)
BUN: 6 mg/dL (ref 6–20)
CO2: 27 mmol/L (ref 22–32)
Calcium: 8.3 mg/dL — ABNORMAL LOW (ref 8.9–10.3)
Chloride: 103 mmol/L (ref 101–111)
Creatinine, Ser: 0.47 mg/dL (ref 0.44–1.00)
GFR calc Af Amer: >60 mL/min (ref >60)
GFR calc non Af Amer: >60 mL/min (ref >60)
Glucose, Bld: 149 mg/dL — ABNORMAL HIGH (ref 65–99)
Potassium: 4.1 mmol/L (ref 3.5–5.1)
Sodium: 133 mmol/L — ABNORMAL LOW (ref 135–145)

## 2016-07-13 LAB — GLUCOSE, CAPILLARY
GLUCOSE-CAPILLARY: 114 mg/dL — AB (ref 65–99)
Glucose-Capillary: 125 mg/dL — ABNORMAL HIGH (ref 65–99)
Glucose-Capillary: 128 mg/dL — ABNORMAL HIGH (ref 65–99)

## 2016-07-13 LAB — CBC
HCT: 29.7 % — ABNORMAL LOW (ref 36.0–46.0)
Hemoglobin: 9.9 g/dL — ABNORMAL LOW (ref 12.0–15.0)
MCH: 30.1 pg (ref 26.0–34.0)
MCHC: 33.3 g/dL (ref 30.0–36.0)
MCV: 90.3 fL (ref 78.0–100.0)
Platelets: 179 10*3/uL (ref 150–400)
RBC: 3.29 MIL/uL — ABNORMAL LOW (ref 3.87–5.11)
RDW: 13.1 % (ref 11.5–15.5)
WBC: 19.8 10*3/uL — ABNORMAL HIGH (ref 4.0–10.5)

## 2016-07-13 MED ORDER — ALBUTEROL SULFATE (2.5 MG/3ML) 0.083% IN NEBU
3.0000 mL | INHALATION_SOLUTION | Freq: Four times a day (QID) | RESPIRATORY_TRACT | Status: DC | PRN
Start: 2016-07-13 — End: 2016-07-16

## 2016-07-13 MED ORDER — ONDANSETRON HCL 4 MG/2ML IJ SOLN
4.0000 mg | Freq: Four times a day (QID) | INTRAMUSCULAR | Status: DC | PRN
  Administered 2016-07-13: 4 mg via INTRAVENOUS
  Filled 2016-07-13: qty 2

## 2016-07-13 MED ORDER — METOPROLOL TARTRATE 12.5 MG HALF TABLET
12.5000 mg | ORAL_TABLET | Freq: Two times a day (BID) | ORAL | Status: DC
  Administered 2016-07-13 – 2016-07-14 (×3): 12.5 mg via ORAL
  Filled 2016-07-13 (×3): qty 1

## 2016-07-13 MED ORDER — SODIUM CHLORIDE 0.9% FLUSH
3.0000 mL | Freq: Two times a day (BID) | INTRAVENOUS | Status: DC
  Administered 2016-07-13 – 2016-07-15 (×4): 3 mL via INTRAVENOUS

## 2016-07-13 MED ORDER — SODIUM CHLORIDE 0.9 % IV SOLN: 250.0000 mL | INTRAVENOUS | Status: DC | PRN

## 2016-07-13 MED ORDER — POLYETHYLENE GLYCOL 3350 17 G PO PACK
17.0000 g | PACK | Freq: Every day | ORAL | Status: DC | PRN
  Administered 2016-07-13 – 2016-07-16 (×4): 17 g via ORAL
  Filled 2016-07-13 (×4): qty 1

## 2016-07-13 MED ORDER — DIPHENHYDRAMINE HCL 25 MG PO CAPS
25.0000 mg | ORAL_CAPSULE | Freq: Every evening | ORAL | Status: DC | PRN
  Administered 2016-07-14 – 2016-07-15 (×2): 25 mg via ORAL
  Filled 2016-07-13 (×2): qty 1

## 2016-07-13 MED ORDER — FLUTICASONE PROPIONATE 50 MCG/ACT NA SUSP
2.0000 | Freq: Every day | NASAL | Status: DC
  Administered 2016-07-13 – 2016-07-15 (×3): 2 via NASAL
  Filled 2016-07-13: qty 16

## 2016-07-13 MED ORDER — ENOXAPARIN SODIUM 30 MG/0.3ML ~~LOC~~ SOLN
30.0000 mg | SUBCUTANEOUS | Status: DC
  Administered 2016-07-13 – 2016-07-16 (×4): 30 mg via SUBCUTANEOUS
  Filled 2016-07-13 (×4): qty 0.3

## 2016-07-13 MED ORDER — DOCUSATE SODIUM 100 MG PO CAPS
200.0000 mg | ORAL_CAPSULE | Freq: Two times a day (BID) | ORAL | Status: DC | PRN
  Administered 2016-07-13: 200 mg via ORAL
  Filled 2016-07-13: qty 2

## 2016-07-13 MED ORDER — PANTOPRAZOLE SODIUM 40 MG PO TBEC
40.0000 mg | DELAYED_RELEASE_TABLET | Freq: Every day | ORAL | Status: DC
  Administered 2016-07-13 – 2016-07-16 (×3): 40 mg via ORAL
  Filled 2016-07-13 (×4): qty 1

## 2016-07-13 MED ORDER — VITAMIN B-12 1000 MCG PO TABS
1000.0000 ug | ORAL_TABLET | Freq: Every day | ORAL | Status: DC
  Administered 2016-07-13 – 2016-07-15 (×3): 1000 ug via ORAL
  Filled 2016-07-13 (×3): qty 1

## 2016-07-13 MED ORDER — DOCUSATE SODIUM 100 MG PO CAPS
200.0000 mg | ORAL_CAPSULE | Freq: Every day | ORAL | Status: DC
Start: 2016-07-13 — End: 2016-07-13

## 2016-07-13 MED ORDER — ASPIRIN EC 325 MG PO TBEC
325.0000 mg | DELAYED_RELEASE_TABLET | Freq: Every day | ORAL | Status: DC
  Administered 2016-07-13 – 2016-07-16 (×4): 325 mg via ORAL
  Filled 2016-07-13 (×4): qty 1

## 2016-07-13 MED ORDER — SODIUM CHLORIDE 0.9% FLUSH
3.0000 mL | INTRAVENOUS | Status: DC | PRN
  Administered 2016-07-14: 3 mL via INTRAVENOUS
  Filled 2016-07-13: qty 3

## 2016-07-13 MED ORDER — OXYCODONE HCL 5 MG PO TABS
5.0000 mg | ORAL_TABLET | ORAL | Status: DC | PRN
  Administered 2016-07-14 – 2016-07-15 (×8): 5 mg via ORAL
  Filled 2016-07-13: qty 2
  Filled 2016-07-13 (×7): qty 1

## 2016-07-13 MED ORDER — MOVING RIGHT ALONG BOOK
Freq: Once | Status: AC
  Administered 2016-07-13: 1
  Filled 2016-07-13: qty 1

## 2016-07-13 MED ORDER — ONDANSETRON HCL 4 MG PO TABS: 4.0000 mg | ORAL_TABLET | Freq: Four times a day (QID) | ORAL | Status: DC | PRN

## 2016-07-13 MED ORDER — TRAMADOL HCL 50 MG PO TABS
50.0000 mg | ORAL_TABLET | ORAL | Status: DC | PRN
  Administered 2016-07-13 (×2): 100 mg via ORAL
  Filled 2016-07-13 (×2): qty 2

## 2016-07-13 NOTE — Progress Notes (Signed)
Pt received from ICU RN Marita Kansas. Pt and husband oriented to room and equipment. VSS. CCMD notified x2. Pt denies pain or needs at this time. Call light within reach, husband at bedside.   Fritz Pickerel, RN

## 2016-07-13 NOTE — Progress Notes (Signed)
Patient ID: Emily Joseph, female   DOB: Mar 15, 1971, 45 y.o.   MRN: [id number] TCTS DAILY ICU PROGRESS NOTE                   [address]            [place] [address]          [id number]   2 Days Post-Op Procedure(s) (LRB): MINIMALLY INVASIVE RESECTION OF RIGHT ATRIAL LIPOMA WITH CLOSURE OF PATENT FORAMEN OVALE (Right) TRANSESOPHAGEAL ECHOCARDIOGRAM (TEE) (N/A)  Total Length of Stay:  LOS: 2 days   Subjective: Awake and alert, walked some in hall , but slow to move   Objective: Vital signs in last 24 hours: Temp:  [97.8 F (36.6 C)-98.7 F (37.1 C)] 97.8 F (36.6 C) (09/03 0425) Pulse Rate:  [70-93] 70 (09/03 0700) Cardiac Rhythm: Normal sinus rhythm (09/03 0700) Resp:  [8-29] 12 (09/03 0700) BP: (88-116)/(58-76) 91/68 (09/03 0700) SpO2:  [97 %-100 %] 98 % (09/03 0700) Weight:  [137 lb 11.2 oz (62.5 kg)] 137 lb 11.2 oz (62.5 kg) (09/03 0500)  Filed Weights   07/11/16 0613 07/12/16 0545 07/13/16 0500  Weight: 134 lb 5 oz (60.9 kg) 141 lb 12.8 oz (64.3 kg) 137 lb 11.2 oz (62.5 kg)    Weight change: -4 lb 1.6 oz (-1.86 kg)   Hemodynamic parameters for last 24 hours:    Intake/Output from previous day: 09/02 0701 - 09/03 0700 In: 1053 [P.O.:480; I.V.:473; IV Piggyback:100] Out: 2315 [Urine:1995; Chest Tube:320]  Intake/Output this shift: No intake/output data recorded.  Current Meds: Scheduled Meds: . acetaminophen  1,000 mg Oral Q6H   Or  . acetaminophen (TYLENOL) oral liquid 160 mg/5 mL  1,000 mg Per Tube Q6H  . aspirin EC  325 mg Oral Daily   Or  . aspirin  324 mg Per Tube Daily  . bisacodyl  10 mg Oral Daily   Or  . bisacodyl  10 mg Rectal Daily  . cefUROXime (ZINACEF)  IV  1.5 g Intravenous Q12H  . docusate sodium  200 mg Oral Daily  . insulin aspart  0-24 Units Subcutaneous Q4H  . metoprolol tartrate  12.5 mg Oral BID   Or  . metoprolol tartrate  12.5 mg Per Tube BID  . pantoprazole  40 mg Oral Daily  . sodium chloride flush  3 mL  Intravenous Q12H   Continuous Infusions: . sodium chloride 20 mL/hr at 07/11/16 1900  . dexmedetomidine Stopped (07/12/16 0500)  . lactated ringers    . lactated ringers    . nitroGLYCERIN    . phenylephrine (NEO-SYNEPHRINE) Adult infusion Stopped (07/11/16 2300)   PRN Meds:.sodium chloride, lactated ringers, metoprolol, midazolam, morphine injection, ondansetron (ZOFRAN) IV, oxyCODONE, sodium chloride flush, traMADol  General appearance: alert, cooperative and no distress Neurologic: intact Heart: regular rate and rhythm, S1, S2 normal, no murmur, click, rub or gallop Lungs: diminished breath sounds bibasilar Abdomen: soft, non-tender; bowel sounds normal; no masses,  no organomegaly Extremities: extremities normal, atraumatic, no cyanosis or edema and Homans sign is negative, no sign of DVT Wound: no air leak from chest tubes 320 from tubes 24 hours   Lab Results: CBC: Recent Labs  07/12/16 1700 07/13/16 0330  WBC 18.0* 19.8*  HGB 10.0* 9.9*  HCT 30.4* 29.7*  PLT 160 179   BMET:  Recent Labs  07/12/16 0300 07/12/16 1659 07/12/16 1700 07/13/16 0330  NA 131* 134*  --  133*  K 4.1 4.1  --  4.1  CL 103 97*  --  103  CO2 23  --   --  27  GLUCOSE 92 138*  --  149*  BUN <5* 4*  --  6  CREATININE 0.46 0.50 0.54 0.47  CALCIUM 7.5*  --   --  8.3*    CMET: Lab Results  Component Value Date   WBC 19.8 (H) 07/13/2016   HGB 9.9 (L) 07/13/2016   HCT 29.7 (L) 07/13/2016   PLT 179 07/13/2016   GLUCOSE 149 (H) 07/13/2016   ALT 16 07/09/2016   AST 17 07/09/2016   NA 133 (L) 07/13/2016   K 4.1 07/13/2016   CL 103 07/13/2016   CREATININE 0.47 07/13/2016   BUN 6 07/13/2016   CO2 27 07/13/2016   TSH 0.82 03/04/2016   INR 1.49 07/11/2016   HGBA1C 5.1 07/09/2016    PT/INR:  Recent Labs  07/11/16 1313  LABPROT 18.1*  INR 1.49   Radiology: Dg Chest Port 1 View  Result Date: 07/13/2016 CLINICAL DATA:  Chest tube in place EXAM: PORTABLE CHEST 1 VIEW COMPARISON:   July 12, 2016 FINDINGS: The heart size and mediastinal contours are within normal limits. Right chest tube is identified unchanged. There is no pleural line to suggest pneumothorax. There is no focal pneumonia, pulmonary edema or pleural effusion. There is probable minimal atelectasis of right lung base. The visualized skeletal structures are stable. IMPRESSION: Probable minimal atelectasis of right lung base. Right chest tube is identified unchanged. There is no pneumothorax. Electronically Signed   By: Abelardo Diesel M.D.   On: 07/13/2016 07:22     Assessment/Plan: S/P Procedure(s) (LRB): MINIMALLY INVASIVE RESECTION OF RIGHT ATRIAL LIPOMA WITH CLOSURE OF PATENT FORAMEN OVALE (Right) TRANSESOPHAGEAL ECHOCARDIOGRAM (TEE) (N/A) Mobilize Plan for transfer to step-down: see transfer orders D/c central line and foley    [name] 07/13/2016 8:18 AM

## 2016-07-13 NOTE — Progress Notes (Signed)
Patient transferred to 2W21 with no distress noted. Report received and no questions. Family in room and aware of change. Will transfer care at this time.

## 2016-07-14 ENCOUNTER — Inpatient Hospital Stay (HOSPITAL_COMMUNITY): Payer: 59

## 2016-07-14 ENCOUNTER — Encounter (HOSPITAL_COMMUNITY): Payer: Self-pay | Admitting: Thoracic Surgery (Cardiothoracic Vascular Surgery)

## 2016-07-14 DIAGNOSIS — N390 Urinary tract infection, site not specified: Secondary | ICD-10-CM

## 2016-07-14 HISTORY — DX: Urinary tract infection, site not specified: N39.0

## 2016-07-14 LAB — URINE MICROSCOPIC-ADD ON

## 2016-07-14 LAB — BASIC METABOLIC PANEL
Anion gap: 3 — ABNORMAL LOW (ref 5–15)
BUN: <5 mg/dL — ABNORMAL LOW (ref 6–20)
CO2: 29 mmol/L (ref 22–32)
Calcium: 8.2 mg/dL — ABNORMAL LOW (ref 8.9–10.3)
Chloride: 103 mmol/L (ref 101–111)
Creatinine, Ser: 0.57 mg/dL (ref 0.44–1.00)
GFR calc Af Amer: >60 mL/min (ref >60)
GFR calc non Af Amer: >60 mL/min (ref >60)
Glucose, Bld: 151 mg/dL — ABNORMAL HIGH (ref 65–99)
Potassium: 3.2 mmol/L — ABNORMAL LOW (ref 3.5–5.1)
Sodium: 135 mmol/L (ref 135–145)

## 2016-07-14 LAB — URINALYSIS, ROUTINE W REFLEX MICROSCOPIC
BILIRUBIN URINE: NEGATIVE
GLUCOSE, UA: NEGATIVE mg/dL
KETONES UR: 40 mg/dL — AB
Leukocytes, UA: NEGATIVE
Nitrite: NEGATIVE
PH: 6.5 (ref 5.0–8.0)
PROTEIN: NEGATIVE mg/dL
Specific Gravity, Urine: 1.011 (ref 1.005–1.030)

## 2016-07-14 LAB — CBC
HCT: 29.2 % — ABNORMAL LOW (ref 36.0–46.0)
Hemoglobin: 9.6 g/dL — ABNORMAL LOW (ref 12.0–15.0)
MCH: 29.9 pg (ref 26.0–34.0)
MCHC: 32.9 g/dL (ref 30.0–36.0)
MCV: 91 fL (ref 78.0–100.0)
Platelets: 174 10*3/uL (ref 150–400)
RBC: 3.21 MIL/uL — ABNORMAL LOW (ref 3.87–5.11)
RDW: 12.8 % (ref 11.5–15.5)
WBC: 13.5 10*3/uL — ABNORMAL HIGH (ref 4.0–10.5)

## 2016-07-14 MED ORDER — METOPROLOL TARTRATE 25 MG PO TABS
25.0000 mg | ORAL_TABLET | Freq: Two times a day (BID) | ORAL | Status: DC
  Administered 2016-07-14 – 2016-07-16 (×4): 25 mg via ORAL
  Filled 2016-07-14 (×4): qty 1

## 2016-07-14 MED ORDER — AMOXICILLIN-POT CLAVULANATE 500-125 MG PO TABS
1.0000 | ORAL_TABLET | Freq: Two times a day (BID) | ORAL | Status: DC
  Administered 2016-07-14 – 2016-07-16 (×4): 500 mg via ORAL
  Filled 2016-07-14 (×4): qty 1

## 2016-07-14 MED ORDER — PHENAZOPYRIDINE HCL 100 MG PO TABS
100.0000 mg | ORAL_TABLET | Freq: Three times a day (TID) | ORAL | Status: DC
  Administered 2016-07-14 – 2016-07-16 (×5): 100 mg via ORAL
  Filled 2016-07-14 (×6): qty 1

## 2016-07-14 MED ORDER — POTASSIUM CHLORIDE CRYS ER 20 MEQ PO TBCR
20.0000 meq | EXTENDED_RELEASE_TABLET | Freq: Once | ORAL | Status: AC
  Administered 2016-07-14: 20 meq via ORAL
  Filled 2016-07-14: qty 1

## 2016-07-14 MED ORDER — POTASSIUM CHLORIDE 20 MEQ PO PACK
40.0000 meq | PACK | Freq: Once | ORAL | Status: AC
  Administered 2016-07-14: 40 meq via ORAL
  Filled 2016-07-14: qty 2

## 2016-07-14 MED ORDER — HYDROCORTISONE 1 % EX CREA
TOPICAL_CREAM | Freq: Two times a day (BID) | CUTANEOUS | Status: DC | PRN
  Filled 2016-07-14: qty 28

## 2016-07-14 MED ORDER — FUROSEMIDE 20 MG PO TABS
20.0000 mg | ORAL_TABLET | Freq: Every day | ORAL | Status: AC
  Administered 2016-07-14 – 2016-07-15 (×2): 20 mg via ORAL
  Filled 2016-07-14 (×2): qty 1

## 2016-07-14 NOTE — Progress Notes (Addendum)
[address]       [place] [address]             [phone]      3 Days Post-Op Procedure(s) (LRB): MINIMALLY INVASIVE RESECTION OF RIGHT ATRIAL LIPOMA WITH CLOSURE OF PATENT FORAMEN OVALE (Right) TRANSESOPHAGEAL ECHOCARDIOGRAM (TEE) (N/A) Subjective: Shares that she believes she has some cystitis. Had some incisional pain overnight. Well controlled with oral pain medication.   Objective: Vital signs in last 24 hours: Temp:  [97.8 F (36.6 C)-98.6 F (37 C)] 98.6 F (37 C) (09/04 0515) Pulse Rate:  [76-98] 92 (09/04 0515) Cardiac Rhythm: Normal sinus rhythm (09/04 0700) Resp:  [14-21] 18 (09/04 0515) BP: (104-119)/(64-74) 108/70 (09/04 0515) SpO2:  [99 %-100 %] 99 % (09/04 0515) Weight:  [136 lb 12.8 oz (62.1 kg)] 136 lb 12.8 oz (62.1 kg) (09/04 0515)     Intake/Output from previous day: 09/03 0701 - 09/04 0700 In: 20 [I.V.:20] Out: 895 [Urine:875; Chest Tube:20] Intake/Output this shift: No intake/output data recorded.  General appearance: alert and cooperative Heart: sinus tachycardia Lungs: clear to auscultation bilaterally and crackles on the lower right lobe Abdomen: soft, non-tender; bowel sounds normal; no masses,  no organomegaly Extremities: extremities normal, atraumatic, no cyanosis or edema Wound: c/d/i  Lab Results:  Recent Labs  07/13/16 0330 07/14/16 0159  WBC 19.8* 13.5*  HGB 9.9* 9.6*  HCT 29.7* 29.2*  PLT 179 174   BMET:  Recent Labs  07/13/16 0330 07/14/16 0159  NA 133* 135  K 4.1 3.2*  CL 103 103  CO2 27 29  GLUCOSE 149* 151*  BUN 6 <5*  CREATININE 0.47 0.57  CALCIUM 8.3* 8.2*    PT/INR:  Recent Labs  07/11/16 1313  LABPROT 18.1*  INR 1.49   ABG    Component Value Date/Time   PHART 7.340 (L) 07/11/2016 1851   HCO3 22.0 07/11/2016 1851   TCO2 25 07/12/2016 1659   ACIDBASEDEF 4.0 (H) 07/11/2016 1851   O2SAT 99.0 07/11/2016 1851   CBG (last 3)   Recent Labs  07/13/16 0423 07/13/16 0827  07/13/16 1221  GLUCAP 125* 114* 128*    Assessment/Plan: S/P Procedure(s) (LRB): MINIMALLY INVASIVE RESECTION OF RIGHT ATRIAL LIPOMA WITH CLOSURE OF PATENT FORAMEN OVALE (Right) TRANSESOPHAGEAL ECHOCARDIOGRAM (TEE) (N/A)   1. CV- sinus tachycardia. Will increase BB to 25mg  BID. Hold for SBP < 105.  2. Pulmonary- Crackles in the right lower lobe. Will order a CXR today since none post chest tube removal. On room air with good oxygen saturation. Encouraged Incentive spirometry. Ambulation.  3. Renal- creatinine stable. Remains fluid negative. She complains of burning with urination so I ordered a UA with culture. She also states she uses hydrocortisone on her urethra at home for swelling. Await culture.  4. GI- prefers miralax, no Bm. 5. PT/OT  Consulted to review precautions and movement limitations. Pt having anxiety about moving the wrong way. 6. Pain- continue oral medication.   Plan: Mobilize. Control pain. Await urine culture. Increase BB for improved HR control. Home soon.     LOS: 3 days    Elgie Collard 07/14/2016   Wt Readings from Last 3 Encounters:  07/14/16 136 lb 12.8 oz (62.1 kg)  07/09/16 134 lb 5 oz (60.9 kg)  07/04/16 137 lb (62.1 kg)  mild diuresis  Feels better today, walking in hall  Checking urine   D/c ON Q  today  I have seen and examined Emily Joseph  and agree with the above assessment  and plan.  [name] MD Beeper [phone] Office [phone] 07/14/2016 12:34 PM

## 2016-07-14 NOTE — Evaluation (Signed)
Physical Therapy Evaluation/ Discharge Patient Details Name: Emily Joseph MRN: [id number] DOB: 10/23/1971 Today's Date: 07/14/2016   History of Present Illness  45 yo admitted for Rt atrial lipoma resection and PFO closure  Clinical Impression  Pt very pleasant and nervous to move because she is afraid she will injure something. Pt educated for limited RUE lifting and pushing to allow healing but need for increased mobility and gait daily. Pt able to perform all mobility without physical assist with all questions answered during session. Pt encouraged to ambulate daily with nursing. No further therapy needs at this time with pt aware and agreeable.    HR 104  Follow Up Recommendations No PT follow up    Equipment Recommendations  None recommended by PT    Recommendations for Other Services       Precautions / Restrictions Precautions Precautions: None      Mobility  Bed Mobility Overal bed mobility: Needs Assistance Bed Mobility: Supine to Sit     Supine to sit: Supervision     General bed mobility comments: cues for sequence to roll to left and up from side  Transfers Overall transfer level: Modified independent                  Ambulation/Gait Ambulation/Gait assistance: Modified independent (Device/Increase time) Ambulation Distance (Feet): 650 Feet Assistive device: None Gait Pattern/deviations: Step-through pattern;Decreased stride length   Gait velocity interpretation: Below normal speed for age/gender General Gait Details: pt with cautious gait and decreased stride length, no LOB  Stairs Stairs: Yes Stairs assistance: Modified independent (Device/Increase time) Stair Management: One rail Left;Alternating pattern;Forwards Number of Stairs: 11    Wheelchair Mobility    Modified Rankin (Stroke Patients Only)       Balance Overall balance assessment: No apparent balance deficits (not formally assessed)                                            Pertinent Vitals/Pain Pain Assessment: No/denies pain    Home Living Family/patient expects to be discharged to:: Private residence Living Arrangements: Spouse/significant other Available Help at Discharge: Family;Available 24 hours/day Type of Home: House Home Access: Stairs to enter   [employer] of Steps: 2 Home Layout: Two level;Bed/bath upstairs Home Equipment: None      Prior Function Level of Independence: Independent         Comments: 4 kids age 10-14     Hand Dominance        Extremity/Trunk Assessment   Upper Extremity Assessment: RUE deficits/detail RUE Deficits / Details: decreased ROM secondary to post op pain         Lower Extremity Assessment: Overall WFL for tasks assessed      Cervical / Trunk Assessment: Normal  Communication   Communication: No difficulties  Cognition Arousal/Alertness: Awake/alert Behavior During Therapy: WFL for tasks assessed/performed Overall Cognitive Status: Within Functional Limits for tasks assessed                      General Comments      Exercises        Assessment/Plan    PT Assessment Patent does not need any further PT services  PT Diagnosis Difficulty walking   PT Problem List    PT Treatment Interventions     PT Goals (Current goals can be found in the Care  Plan section) Acute Rehab PT Goals PT Goal Formulation: All assessment and education complete, DC therapy    Frequency     Barriers to discharge        Co-evaluation               End of Session   Activity Tolerance: Patient tolerated treatment well Patient left: in chair;with call bell/phone within reach Nurse Communication: Mobility status         Time: [id number] PT Time Calculation (min) (ACUTE ONLY): 20 min   Charges:   PT Evaluation $PT Eval Moderate Complexity: 1 Procedure     PT G CodesMelford Aase 07/14/2016, 1:48 PM Elwyn Reach,  [place]

## 2016-07-15 ENCOUNTER — Inpatient Hospital Stay (HOSPITAL_COMMUNITY): Payer: 59

## 2016-07-15 LAB — BASIC METABOLIC PANEL
Anion gap: 6 (ref 5–15)
BUN: <5 mg/dL — ABNORMAL LOW (ref 6–20)
CO2: 28 mmol/L (ref 22–32)
Calcium: 8.5 mg/dL — ABNORMAL LOW (ref 8.9–10.3)
Chloride: 103 mmol/L (ref 101–111)
Creatinine, Ser: 0.51 mg/dL (ref 0.44–1.00)
GFR calc Af Amer: >60 mL/min (ref >60)
GFR calc non Af Amer: >60 mL/min (ref >60)
Glucose, Bld: 108 mg/dL — ABNORMAL HIGH (ref 65–99)
Potassium: 3.4 mmol/L — ABNORMAL LOW (ref 3.5–5.1)
Sodium: 137 mmol/L (ref 135–145)

## 2016-07-15 LAB — CBC
HCT: 29 % — ABNORMAL LOW (ref 36.0–46.0)
Hemoglobin: 9.3 g/dL — ABNORMAL LOW (ref 12.0–15.0)
MCH: 29.3 pg (ref 26.0–34.0)
MCHC: 32.1 g/dL (ref 30.0–36.0)
MCV: 91.5 fL (ref 78.0–100.0)
Platelets: 224 10*3/uL (ref 150–400)
RBC: 3.17 MIL/uL — ABNORMAL LOW (ref 3.87–5.11)
RDW: 12.9 % (ref 11.5–15.5)
WBC: 11.3 10*3/uL — ABNORMAL HIGH (ref 4.0–10.5)

## 2016-07-15 MED ORDER — POTASSIUM CHLORIDE 20 MEQ PO PACK
40.0000 meq | PACK | Freq: Once | ORAL | Status: AC
  Administered 2016-07-15: 40 meq via ORAL
  Filled 2016-07-15: qty 2

## 2016-07-15 MED FILL — Mannitol IV Soln 20%: INTRAVENOUS | Qty: 500 | Status: AC

## 2016-07-15 MED FILL — Electrolyte-R (PH 7.4) Solution: INTRAVENOUS | Qty: 4000 | Status: AC

## 2016-07-15 MED FILL — Sodium Chloride IV Soln 0.9%: INTRAVENOUS | Qty: 2000 | Status: AC

## 2016-07-15 MED FILL — Heparin Sodium (Porcine) Inj 1000 Unit/ML: INTRAMUSCULAR | Qty: 20 | Status: AC

## 2016-07-15 MED FILL — Lidocaine HCl IV Inj 20 MG/ML: INTRAVENOUS | Qty: 5 | Status: AC

## 2016-07-15 MED FILL — Sodium Bicarbonate IV Soln 8.4%: INTRAVENOUS | Qty: 50 | Status: AC

## 2016-07-15 NOTE — Progress Notes (Signed)
1410-1500 Pt has walked several times today so education done. Understanding voiced. Encouraged IS and walking for ex. Discussed CRP 2 and will refer to [place]. Pt to check with insurance re coverage. Questions re activity answered. Graylon Good RN BSN 07/15/2016 2:59 PM

## 2016-07-15 NOTE — Progress Notes (Signed)
OT Cancellation Note  Patient Details Name: Emily Joseph MRN: [id number] DOB: July 25, 1971   Cancelled Treatment:    Reason Eval/Treat Not Completed: Patient at procedure or test/ unavailable. Pt currently getting pacing wires out and will be on bedrest for 1 hour. Will check back as schedule allows.  Almon Register [id number] 07/15/2016, 8:37 AM

## 2016-07-15 NOTE — Progress Notes (Signed)
Pacing wires and onQ pump removed. Pt tolerated it well. Pt educated on the importance of one hour bed rest. Will continue to monitor.

## 2016-07-15 NOTE — Evaluation (Signed)
Occupational Therapy Evaluation and Discharge Patient Details Name: OSIA AYE MRN: [id number] DOB: [birth year] Today's Date: 07/15/2016    History of Present Illness 45 yo admitted for Rt atrial lipoma resection and PFO closure   Clinical Impression   This 45 yo female admitted and underwent above presents to acute OT with all education completed with pt and husband, we will D/C from acute OT.    Follow Up Recommendations  No OT follow up    Equipment Recommendations  None recommended by OT       Precautions / Restrictions Restrictions Weight Bearing Restrictions: Yes Other Position/Activity Restrictions: No pushing/pulling/lifting more than a 1/2 gallon of milk with left arm only; raise up from bed from left sidelying, no lifting right arm over head, no reaching out to open/close doors      Mobility Bed Mobility Overal bed mobility: Modified Independent Bed Mobility: Rolling;Sidelying to Sit Rolling: Modified independent (Device/Increase time) (increased time) Sidelying to sit: Supervision          Transfers Overall transfer level: Modified independent (increased ime)                    Balance Overall balance assessment: No apparent balance deficits (not formally assessed)                                          ADL                                         General ADL Comments: educated pt and husband as well as provided hanout on dressing (upper and lower body), bathing, grooming, washing hair, opening/closing doors, lifting restrictions, making sure items that she needs during the day are placed for her at counter height, that is is ok to use her arms for brushing her teeth-eating--keyboarding-washing face. She also asked about a reacher and made her aware where she could get one.               Pertinent Vitals/Pain Pain Assessment: No/denies pain     Hand Dominance Right   Extremity/Trunk Assessment Upper  Extremity Assessment Upper Extremity Assessment: RUE deficits/detail RUE Deficits / Details: decreased ROM secondary to post op pain; pt aware of the "space" that is safe for her to work in when completing tasks RUE Coordination: decreased gross motor           Communication Communication Communication: No difficulties   Cognition Arousal/Alertness: Awake/alert Behavior During Therapy: WFL for tasks assessed/performed Overall Cognitive Status: Within Functional Limits for tasks assessed                                Home Living Family/patient expects to be discharged to:: Private residence Living Arrangements: Spouse/significant other Available Help at Discharge: Family;Available 24 hours/day Type of Home: House Home Access: Stairs to enter [employer] of Steps: 2   Home Layout: Two level;Bed/bath upstairs     Bathroom Shower/Tub:  (pt will sponge bath for now until surgeon clears her to get incisions wet)   Bathroom Toilet: Standard     Home Equipment: None          Prior Functioning/Environment Level of Independence: Independent  Comments: 4 kids age 48-14    OT Diagnosis: Generalized weakness         OT Goals(Current goals can be found in the care plan section) Acute Rehab OT Goals Patient Stated Goal: home soon  OT Frequency:                End of Session Equipment Utilized During Treatment:  (none)  Activity Tolerance: Patient tolerated treatment well Patient left: in chair (at sink with husband helping her get washed up. Pt and husband made aware that when she finished to call nursing to help put leads back on)   Time: 1030-1104 OT Time Calculation (min): 34 min Charges:  OT General Charges $OT Visit: 1 Procedure OT Evaluation $OT Eval Low Complexity: 1 Procedure OT Treatments $Self Care/Home Management : 8-22 mins  Almon Register  [id number] 07/15/2016, 11:39 AM

## 2016-07-15 NOTE — Discharge Summary (Signed)
Physician Discharge Summary  Patient ID: Emily Joseph MRN: [id number] DOB/AGE: 45/45/1972 45 y.o.  Admit date: 07/11/2016 Discharge date: 07/16/2016  Admission Diagnoses:  Patient Active Problem List   Diagnosis Date Noted  . Chronic pain 06/08/2016  . Chronic fatigue 06/08/2016  . GERD (gastroesophageal reflux disease) with esophagitis 06/08/2016  . Palpitations 06/08/2016  . Anxiety 06/08/2016  . Multiple environmental allergies 03/04/2016  . Multiple food allergies 03/04/2016  . Nasal obstruction 06/26/2015  . Irritable bowel syndrome 01/27/2013  . Fibromyalgia 01/07/2007   Discharge Diagnoses:   Patient Active Problem List   Diagnosis Date Noted  . 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 Chronic pain 06/08/2016  . Chronic fatigue 06/08/2016  . GERD (gastroesophageal reflux disease) with esophagitis 06/08/2016  . Palpitations 06/08/2016  . Anxiety 06/08/2016  . Multiple environmental allergies 03/04/2016  . Multiple food allergies 03/04/2016  . Nasal obstruction 06/26/2015  . Irritable bowel syndrome 01/27/2013  . Fibromyalgia 01/07/2007  Possible cystitis, UTI  Discharged Condition: good  History of Present Illness:  Emily Joseph is a45 year old female with history of iron deficient anemia, eosinophilic esophagitis, and fibromyalgia who was recently referred to Dr. Stanford Breed for cardiology consultation because of a long progressive history of exertional shortness of breath, fatigue, palpitations and atypical chest discomfort. 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  Due to this she was referred to Dr. Roxy Manns for surgical resection.  She was evaluated by Dr. Roxy Manns who was in agreement with proceeding with surgical resection.  The risks and benefits of the procedure were explained to the patient and she was agreeable to proceed.  Hospital Course:   Emily Joseph  presented to [facility] hospital on 07/11/2016.  She was taken to the operating room and underwent Minimally Invasive Resection of Right Atrial Lipoma with closure of Patent Foramen Ovale.  She tolerated the procedure without difficulty and was taken to the SICU in stable condition.  She was extubated the evening of surgery.  During her stay in the SICU the patient's chest tubes and arterial lines were removed without difficulty.  She was maintaining NSR and felt medically stable for transfer to the telemetry unit.  She continued to progress.  She was mildly tachycardic and her lopressor was increased.  She developed symptoms consistent with UTI and UA and culture were obtained.  She was treated with Augmentin. Her UC showed >100,000 gram negative rods.  We are still awaiting sensitivities. She was diuresed for hypervolemia.  She continues to maintain NSR.  Her pacing wires and On Q were removed without difficulty.  She is felt medically stable for discharge home today.       Significant Diagnostic Studies: cardiac graphics:   Echocardiogram:   Left ventricle: The cavity size was normal. Wall thickness was   normal. Systolic function was normal. The estimated ejection   fraction was in the range of 50% to 55%.  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 This myxoma likely impairs   the flow across the tricuspid valve.  Treatments: surgery:    Minimally-Invasive Resection of Right Atrial Lipoma                       Autologous pericardial patch reconstruction of AV groove  Closure of Patent Foramen Ovale  Disposition: 01-Home or Self Care   Discharge Medications:     Medication List    TAKE these medications   acetaminophen 650 MG CR tablet Commonly known as:  TYLENOL Take 650 mg by mouth daily as needed for pain.   albuterol 108 (90 Base) MCG/ACT inhaler Commonly known as:  PROVENTIL HFA;VENTOLIN HFA Inhale 2 puffs into the lungs every 6 (six)  hours as needed for wheezing.   amoxicillin-clavulanate 500-125 MG tablet Commonly known as:  AUGMENTIN Take 1 tablet (500 mg total) by mouth 2 (two) times daily.   aspirin 325 MG EC tablet Take 1 tablet (325 mg total) by mouth daily.   diclofenac sodium 1 % Gel Commonly known as:  VOLTAREN Apply 2 g topically 4 (four) times daily as needed.   docusate sodium 100 MG capsule Commonly known as:  COLACE Take 100 mg by mouth daily as needed for moderate constipation. Reported on 03/12/2016   fluticasone 50 MCG/ACT nasal spray Commonly known as:  FLONASE Place 2 sprays into both nostrils daily.   metoprolol tartrate 25 MG tablet Commonly known as:  LOPRESSOR Take 1 tablet (25 mg total) by mouth 2 (two) times daily.   omeprazole 20 MG capsule Commonly known as:  PRILOSEC Take 20 mg by mouth 2 (two) times daily before a meal.   OVER THE COUNTER MEDICATION Take 1 capsule by mouth daily. Patient takes methylated folate.   oxyCODONE 5 MG immediate release tablet Commonly known as:  Oxy IR/ROXICODONE Take 1 tablet (5 mg total) by mouth every 4 (four) hours as needed for severe pain.   polyethylene glycol packet Commonly known as:  MIRALAX / GLYCOLAX Take 17 g by mouth daily as needed for mild constipation.   predniSONE 20 MG tablet Commonly known as:  DELTASONE 60 mg po evening prior to CT scan and 60 mg po morning of CT scan, along with Benadryl 50 mg morning of CT Scan   vitamin B-12 1000 MCG tablet Commonly known as:  CYANOCOBALAMIN Take 1,000 mcg by mouth daily.      The patient has been discharged on:   1.Beta Blocker:  Yes [ x  ]                              No   [   ]                              If No, reason:  2.Ace Inhibitor/ARB: Yes [   ]                                     No  [ x   ]                                     If No, reason: No CAD, labile BP  3.Statin:   Yes [   ]                  No  [ x  ]                  If No, reason: No CAD  4.Shela CommonsVelta Addison  [  x  ]                  No   [   ]                  If No, reason:   Follow-up Information    Rexene Alberts, MD Follow up on 08/04/2016.   Specialty:  Cardiothoracic Surgery Why:  PA/LAT CXR to be taken (at [facility] which is in the same building as Dr. Guy Sandifer office) on 08/04/2016 at 9:30 am;Appointment time is at 9:00 am Contact information: [place] [address] [phone]        Brian Crenshaw, MD .   Specialty:  Cardiology Why:  Call for a 2 week follow up appointment Contact information: [place] [address] [phone]        Nurse Follow up on 07/24/2016.   Why:  Appointment is with nurse only to have chest tube sutures removed. Appointment time is at 10:00 am Contact information: [place] [address]          Signed: Nani Skillern PA-C 07/16/2016, 9:53 AM

## 2016-07-15 NOTE — Progress Notes (Addendum)
[address]       [place] [address]             [phone]      4 Days Post-Op Procedure(s) (LRB): MINIMALLY INVASIVE RESECTION OF RIGHT ATRIAL LIPOMA WITH CLOSURE OF PATENT FORAMEN OVALE (Right) TRANSESOPHAGEAL ECHOCARDIOGRAM (TEE) (N/A) Subjective: Frequent urination  Objective: Vital signs in last 24 hours: Temp:  [97.4 F (36.3 C)-99 F (37.2 C)] 98.8 F (37.1 C) (09/05 0558) Pulse Rate:  [88-116] 88 (09/05 0558) Cardiac Rhythm: Normal sinus rhythm;Sinus tachycardia (09/04 1930) Resp:  [20] 20 (09/05 0558) BP: (116-125)/(63-72) 125/63 (09/05 0558) SpO2:  [98 %-100 %] 98 % (09/05 0558) Weight:  [134 lb 12.8 oz (61.1 kg)] 134 lb 12.8 oz (61.1 kg) (09/05 0558)  Hemodynamic parameters for last 24 hours:    Intake/Output from previous day: 09/04 0701 - 09/05 0700 In: 840 [P.O.:840] Out: 2900 [Urine:2900] Intake/Output this shift: No intake/output data recorded.  General appearance: cooperative and anxious Heart: regular rate and rhythm and tachy, no murmur Lungs: clear to auscultation bilaterally Abdomen: benign Extremities: min edema Wound: incis healing well  Lab Results:  Recent Labs  07/14/16 0159 07/15/16 0316  WBC 13.5* 11.3*  HGB 9.6* 9.3*  HCT 29.2* 29.0*  PLT 174 224   BMET:  Recent Labs  07/14/16 0159 07/15/16 0316  NA 135 137  K 3.2* 3.4*  CL 103 103  CO2 29 28  GLUCOSE 151* 108*  BUN <5* <5*  CREATININE 0.57 0.51  CALCIUM 8.2* 8.5*    PT/INR: No results for input(s): LABPROT, INR in the last 72 hours. ABG    Component Value Date/Time   PHART 7.340 (L) 07/11/2016 1851   HCO3 22.0 07/11/2016 1851   TCO2 25 07/12/2016 1659   ACIDBASEDEF 4.0 (H) 07/11/2016 1851   O2SAT 99.0 07/11/2016 1851   CBG (last 3)   Recent Labs  07/13/16 0423 07/13/16 0827 07/13/16 1221  GLUCAP 125* 114* 128*    Meds Scheduled Meds: . amoxicillin-clavulanate  1 tablet Oral BID  . aspirin EC  325 mg Oral Daily  .  enoxaparin (LOVENOX) injection  30 mg Subcutaneous Q24H  . fluticasone  2 spray Each Nare Daily  . furosemide  20 mg Oral Daily  . metoprolol tartrate  25 mg Oral BID  . pantoprazole  40 mg Oral QAC breakfast  . phenazopyridine  100 mg Oral TID WC  . sodium chloride flush  3 mL Intravenous Q12H  . vitamin B-12  1,000 mcg Oral Daily   Continuous Infusions:  PRN Meds:.sodium chloride, albuterol, diphenhydrAMINE, docusate sodium, ondansetron **OR** ondansetron (ZOFRAN) IV, oxyCODONE, polyethylene glycol, sodium chloride flush, traMADol  Xrays Dg Chest 2 View  Result Date: 07/15/2016 CLINICAL DATA:  Chest soreness, right-sided pneumothorax EXAM: CHEST  2 VIEW COMPARISON:  Portable chest x-ray of July 14, 2016 FINDINGS: There is a persistent small right apical pneumothorax amounting to between 5 and 10% of the lung volume. There is a persistent small right pleural effusion blunting the costophrenic angle. There is no left pneumothorax. A trace of pleural fluid is present on the left. There is no alveolar infiltrate. The heart and pulmonary vascularity are normal. Epicardial pacemaker leads are visible. The mediastinum is normal in width. The bony thorax exhibits no acute abnormality. IMPRESSION: Stable 05-10 percent right apical pneumothorax. Small right pleural effusion and trace left pleural effusion, stable. No mediastinal shift. No pneumonia. Electronically Signed   By: David  Martinique M.D.  On: 07/15/2016 07:23   Dg Chest Port 1 View  Result Date: 07/14/2016 CLINICAL DATA:  Shortness of breath, interval resection of right atrial lipoma EXAM: PORTABLE CHEST 1 VIEW COMPARISON:  07/13/2016 FINDINGS: Interval removal of the right apical chest tube. Small right apical pneumothorax.  Left lung is clear. The heart is normal in size. IMPRESSION: Interval removal of the right apical chest tube. Small right apical pneumothorax. These results will be called to the ordering clinician or representative by the  Radiologist Assistant, and communication documented in the PACS or zVision Dashboard. Electronically Signed   By: Julian Hy M.D.   On: 07/14/2016 13:25    Assessment/Plan: S/P Procedure(s) (LRB): MINIMALLY INVASIVE RESECTION OF RIGHT ATRIAL LIPOMA WITH CLOSURE OF PATENT FORAMEN OVALE (Right) TRANSESOPHAGEAL ECHOCARDIOGRAM (TEE) (N/A)  1 doing well 2 may have cystitis- cont augmentin and pyridium for now, cx pending 3 small pntx is stable 4 cont beta blocker, anxiety is a component  5 gentle diuresis for mild volume overload, replace K+ 6 routine rehab, poss home in  am   LOS: 4 days    GOLD,WAYNE E 07/15/2016  I have seen and examined the patient and agree with the assessment and plan as outlined.  Possible d/c home tomorrow.  Rexene Alberts, MD 07/15/2016 8:08 AM

## 2016-07-16 MED ORDER — METOPROLOL TARTRATE 25 MG PO TABS: 25.0000 mg | ORAL_TABLET | Freq: Two times a day (BID) | ORAL | 1 refills | Status: DC

## 2016-07-16 MED ORDER — ASPIRIN 325 MG PO TBEC: 325.0000 mg | DELAYED_RELEASE_TABLET | Freq: Every day | ORAL | 0 refills | Status: DC

## 2016-07-16 MED ORDER — POTASSIUM CHLORIDE CRYS ER 20 MEQ PO TBCR
40.0000 meq | EXTENDED_RELEASE_TABLET | Freq: Once | ORAL | Status: AC
  Administered 2016-07-16: 40 meq via ORAL
  Filled 2016-07-16: qty 2

## 2016-07-16 MED ORDER — AMOXICILLIN-POT CLAVULANATE 500-125 MG PO TABS: 1.0000 | ORAL_TABLET | Freq: Two times a day (BID) | ORAL | 0 refills | Status: DC

## 2016-07-16 MED ORDER — OXYCODONE HCL 5 MG PO TABS: 5.0000 mg | ORAL_TABLET | ORAL | 0 refills | Status: DC | PRN

## 2016-07-16 MED FILL — Heparin Sodium (Porcine) Inj 1000 Unit/ML: INTRAMUSCULAR | Qty: 2500 | Status: AC

## 2016-07-16 MED FILL — Magnesium Sulfate Inj 50%: INTRAMUSCULAR | Qty: 10 | Status: AC

## 2016-07-16 MED FILL — Heparin Sodium (Porcine) Inj 1000 Unit/ML: INTRAMUSCULAR | Qty: 10 | Status: AC

## 2016-07-16 MED FILL — Potassium Chloride Inj 2 mEq/ML: INTRAVENOUS | Qty: 40 | Status: AC

## 2016-07-16 NOTE — Discharge Instructions (Signed)
Activity: 1.May walk up steps                2.No lifting more than ten pounds for two weeks.                 3.No driving for two weeks.                4.Stop any activity that causes chest pain, shortness of breath, dizziness, sweating or excessive weakness.                5.Avoid straining.                6.Continue with your breathing exercises daily.  Diet: Low fat, Low salt diet  Wound Care: May shower.  Clean wounds with mild soap and water daily. Contact the office at [phone] if any problems arise.

## 2016-07-16 NOTE — Progress Notes (Signed)
1025 Observed pt walking in hall independently with steady gait. Stated no questions re ed done yesterday. For discharge. Graylon Good RN BSN 07/16/2016 10:24 AM

## 2016-07-16 NOTE — Progress Notes (Addendum)
      [address]       [place] [address]             [phone]        5 Days Post-Op Procedure(s) (LRB): MINIMALLY INVASIVE RESECTION OF RIGHT ATRIAL LIPOMA WITH CLOSURE OF PATENT FORAMEN OVALE (Right) TRANSESOPHAGEAL ECHOCARDIOGRAM (TEE) (N/A)  Subjective: Patient states urine symptoms better. Has constipation.  Objective: Vital signs in last 24 hours: Temp:  [98.3 F (36.8 C)-98.8 F (37.1 C)] 98.6 F (37 C) (09/06 0445) Pulse Rate:  [89-105] 89 (09/06 0445) Cardiac Rhythm: Normal sinus rhythm (09/05 1900) Resp:  [18-20] 18 (09/06 0445) BP: (97-114)/(59-69) 97/59 (09/06 0445) SpO2:  [98 %-100 %] 98 % (09/06 0445) Weight:  [138 lb 3.2 oz (62.7 kg)] 138 lb 3.2 oz (62.7 kg) (09/06 0445)  Pre op weight  60.9 kg Current Weight  07/16/16 138 lb 3.2 oz (62.7 kg)       Intake/Output from previous day: 09/05 0701 - 09/06 0700 In: 720 [P.O.:720] Out: 1600 [Urine:1600]   Physical Exam:  Cardiovascular: Slightly tachy Pulmonary: Slightly decreased at right base Abdomen: Soft, non tender, bowel sounds present. Extremities: Trace bilateral lower extremity edema. Wound: Clean and dry.  No erythema or signs of infection.  Lab Results: CBC: Recent Labs  07/14/16 0159 07/15/16 0316  WBC 13.5* 11.3*  HGB 9.6* 9.3*  HCT 29.2* 29.0*  PLT 174 224   BMET:  Recent Labs  07/14/16 0159 07/15/16 0316  NA 135 137  K 3.2* 3.4*  CL 103 103  CO2 29 28  GLUCOSE 151* 108*  BUN <5* <5*  CREATININE 0.57 0.51  CALCIUM 8.2* 8.5*    PT/INR:  Lab Results  Component Value Date   INR 1.49 07/11/2016   INR 1.09 07/09/2016   ABG:  INR: Will add last result for INR, ABG once components are confirmed Will add last 4 CBG results once components are confirmed  Assessment/Plan:  1. CV - SR in the 80's this am. On Lopressor 25 mg bid. 2.  Pulmonary - On room air. Encourage incentive spirometer 3.  Acute blood loss anemia - H and H yesterday stable at  9.3 and 29 4. ID-On Augmentin for possible cystitis. Also on Pyridium as well. She has received 3 days of Pyridium and will ask if needs to continue at discharge 5. Constipation-passing flatus but no bowel movement yet. She took Miralax twice and thinks she may be able to go today. 6. Possible discharge later today  ZIMMERMAN,DONIELLE MPA-C 07/16/2016,7:27 AM  I have seen and examined the patient and agree with the assessment and plan as outlined.  Looks ready for d/c home today.  Instructions given.  Rexene Alberts, MD 07/16/2016 9:05 AM

## 2016-07-16 NOTE — Care Management Note (Signed)
Case Management Note Emily Gibbons RN, BSN Unit 2W-Case Manager [phone]  Patient Details  Name: Emily Joseph MRN: [id number] Date of Birth: [birth year]  Subjective/Objective:   Pt admitted s/p resection of atrial tumor                 Action/Plan: PTA pt lived at home with husband and children- anticipate return home with family- no CM needs noted.   Expected Discharge Date:    07/16/16              Expected Discharge Plan:  Home/Self Care  In-House Referral:     Discharge planning Services  CM Consult  Post Acute Care Choice:    Choice offered to:     DME Arranged:    DME Agency:     HH Arranged:    HH Agency:     Status of Service:  Completed, signed off  If discussed at [employer] of Stay Meetings, dates discussed:    Additional Comments:  Dawayne Patricia, RN 07/16/2016, 11:26 AM

## 2016-07-17 ENCOUNTER — Other Ambulatory Visit: Payer: Self-pay | Admitting: Family Medicine

## 2016-07-17 ENCOUNTER — Encounter: Payer: Self-pay | Admitting: Thoracic Surgery (Cardiothoracic Vascular Surgery)

## 2016-07-17 LAB — URINE CULTURE: Culture: >=100000 — AB

## 2016-07-17 MED ORDER — CEFIXIME 400 MG PO TABS: 400.0000 mg | ORAL_TABLET | Freq: Every day | ORAL | 0 refills | Status: DC

## 2016-07-24 ENCOUNTER — Encounter (INDEPENDENT_AMBULATORY_CARE_PROVIDER_SITE_OTHER): Payer: Self-pay

## 2016-07-24 DIAGNOSIS — Z4802 Encounter for removal of sutures: Secondary | ICD-10-CM

## 2016-07-24 DIAGNOSIS — D219 Benign neoplasm of connective and other soft tissue, unspecified: Secondary | ICD-10-CM

## 2016-07-29 ENCOUNTER — Encounter: Payer: Self-pay | Admitting: Student

## 2016-07-29 ENCOUNTER — Ambulatory Visit (INDEPENDENT_AMBULATORY_CARE_PROVIDER_SITE_OTHER): Payer: 59 | Admitting: Student

## 2016-07-29 ENCOUNTER — Other Ambulatory Visit: Payer: Self-pay | Admitting: Student

## 2016-07-29 VITALS — BP 99/67 | HR 90 | Ht 67.0 in | Wt 132.6 lb

## 2016-07-29 DIAGNOSIS — R222 Localized swelling, mass and lump, trunk: Secondary | ICD-10-CM | POA: Diagnosis not present

## 2016-07-29 DIAGNOSIS — R0789 Other chest pain: Secondary | ICD-10-CM | POA: Diagnosis not present

## 2016-07-29 DIAGNOSIS — R42 Dizziness and giddiness: Secondary | ICD-10-CM

## 2016-07-29 DIAGNOSIS — R300 Dysuria: Secondary | ICD-10-CM | POA: Diagnosis not present

## 2016-07-29 DIAGNOSIS — I5189 Other ill-defined heart diseases: Secondary | ICD-10-CM

## 2016-07-29 MED ORDER — SULFAMETHOXAZOLE-TRIMETHOPRIM 800-160 MG PO TABS: 1.0000 | ORAL_TABLET | Freq: Two times a day (BID) | ORAL | 0 refills | Status: DC

## 2016-07-29 MED ORDER — METOPROLOL TARTRATE 25 MG PO TABS: 12.5000 mg | ORAL_TABLET | Freq: Two times a day (BID) | ORAL | 1 refills | Status: DC
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
## 2016-07-29 NOTE — Patient Instructions (Addendum)
Medications:  Decrease Metoprolol to 12.5 mg twice daily.  START Bactrim 800-160 mg 1 tab oral twice daily.    Follow-Up:  Keep your scheduled appointment with Dr. Stanford Breed on 09/16/16 at 4 pm.  If you need a refill on your cardiac medications before your next appointment, please call your pharmacy.

## 2016-08-01 ENCOUNTER — Ambulatory Visit (INDEPENDENT_AMBULATORY_CARE_PROVIDER_SITE_OTHER): Payer: 59 | Admitting: Family Medicine

## 2016-08-01 ENCOUNTER — Encounter: Payer: Self-pay | Admitting: Family Medicine

## 2016-08-01 ENCOUNTER — Ambulatory Visit (INDEPENDENT_AMBULATORY_CARE_PROVIDER_SITE_OTHER)
Admission: RE | Admit: 2016-08-01 | Discharge: 2016-08-01 | Disposition: A | Discharge status: Home / Self Care | Payer: 59 | Source: Ambulatory Visit | Attending: Thoracic Surgery (Cardiothoracic Vascular Surgery) | Admitting: Thoracic Surgery (Cardiothoracic Vascular Surgery)

## 2016-08-01 ENCOUNTER — Other Ambulatory Visit: Payer: Self-pay | Admitting: Thoracic Surgery (Cardiothoracic Vascular Surgery)

## 2016-08-01 VITALS — BP 100/66 | HR 100 | Temp 98.2°F | Ht 67.0 in | Wt 132.5 lb

## 2016-08-01 DIAGNOSIS — R5383 Other fatigue: Secondary | ICD-10-CM

## 2016-08-01 DIAGNOSIS — R399 Unspecified symptoms and signs involving the genitourinary system: Secondary | ICD-10-CM

## 2016-08-01 DIAGNOSIS — R222 Localized swelling, mass and lump, trunk: Secondary | ICD-10-CM | POA: Diagnosis not present

## 2016-08-01 DIAGNOSIS — N390 Urinary tract infection, site not specified: Secondary | ICD-10-CM | POA: Diagnosis not present

## 2016-08-01 DIAGNOSIS — I5189 Other ill-defined heart diseases: Secondary | ICD-10-CM

## 2016-08-01 DIAGNOSIS — R0789 Other chest pain: Secondary | ICD-10-CM

## 2016-08-01 DIAGNOSIS — D509 Iron deficiency anemia, unspecified: Secondary | ICD-10-CM

## 2016-08-01 DIAGNOSIS — L292 Pruritus vulvae: Secondary | ICD-10-CM

## 2016-08-01 LAB — POCT URINALYSIS DIPSTICK
Bilirubin, UA: NEGATIVE
Glucose, UA: NEGATIVE
KETONES UA: NEGATIVE
Leukocytes, UA: NEGATIVE
Nitrite, UA: NEGATIVE
PH UA: 6
PROTEIN UA: NEGATIVE
RBC UA: POSITIVE
SPEC GRAV UA: 1.015
UROBILINOGEN UA: NEGATIVE

## 2016-08-01 LAB — BASIC METABOLIC PANEL
BUN: 9 mg/dL (ref 6–23)
CALCIUM: 9 mg/dL (ref 8.4–10.5)
CHLORIDE: 103 meq/L (ref 96–112)
CO2: 28 meq/L (ref 19–32)
CREATININE: 0.67 mg/dL (ref 0.40–1.20)
GFR: 101.07 mL/min (ref >60.00)
Glucose, Bld: 95 mg/dL (ref 70–99)
Potassium: 4.1 mEq/L (ref 3.5–5.1)
SODIUM: 138 meq/L (ref 135–145)

## 2016-08-01 LAB — CBC WITH DIFFERENTIAL/PLATELET
BASOS ABS: 0.1 10*3/uL (ref 0.0–0.1)
Basophils Relative: 1 % (ref 0.0–3.0)
Eosinophils Absolute: 0.8 10*3/uL — ABNORMAL HIGH (ref 0.0–0.7)
Eosinophils Relative: 9.4 % — ABNORMAL HIGH (ref 0.0–5.0)
HCT: 34 % — ABNORMAL LOW (ref 36.0–46.0)
Hemoglobin: 11.6 g/dL — ABNORMAL LOW (ref 12.0–15.0)
LYMPHS ABS: 1.4 10*3/uL (ref 0.7–4.0)
Lymphocytes Relative: 18 % (ref 12.0–46.0)
MCHC: 34.2 g/dL (ref 30.0–36.0)
MCV: 87.3 fl (ref 78.0–100.0)
MONO ABS: 0.4 10*3/uL (ref 0.1–1.0)
MONOS PCT: 5.4 % (ref 3.0–12.0)
NEUTROS ABS: 5.3 10*3/uL (ref 1.4–7.7)
NEUTROS PCT: 66.2 % (ref 43.0–77.0)
PLATELETS: 368 10*3/uL (ref 150.0–400.0)
RBC: 3.89 Mil/uL (ref 3.87–5.11)
RDW: 13.4 % (ref 11.5–15.5)
WBC: 8 10*3/uL (ref 4.0–10.5)

## 2016-08-01 MED ORDER — NYSTATIN-TRIAMCINOLONE 100000-0.1 UNIT/GM-% EX OINT: 1.0000 "application " | TOPICAL_OINTMENT | Freq: Two times a day (BID) | CUTANEOUS | 0 refills | Status: AC

## 2016-08-01 NOTE — Progress Notes (Signed)
Pre visit review using our clinic review tool, if applicable. No additional management support is needed unless otherwise documented below in the visit note. 
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
## 2016-08-01 NOTE — Patient Instructions (Addendum)
  [name] I have seen you today for an acute visit because your primary care provider was not available.     - POCT urinalysis dipstick  2. Other fatigue  - CBC with Differential/Platelet - Basic Metabolic Panel  3. Urinary tract infection without hematuria, site unspecified  - Urine culture  Continue over-the-counter yeast treatment. Chest x-ray to be done today.   Monitor for signs of worsening symptoms and seek immediate medical attention if any concerning/warning symptom as we discussed.   1 weeks follow up appointment with your doctor, before if symptoms get worse.  Monitor blood pressure at home. Continue monitoring temperature. Complete 5 days of oral antibiotic. Further recommendations will be given according to lab results and  urine culture  *Probiotic daily, Align 1 cap daily.  Keep appointment with cardiologist.

## 2016-08-02 ENCOUNTER — Encounter: Payer: Self-pay | Admitting: Family Medicine

## 2016-08-03 ENCOUNTER — Encounter: Payer: Self-pay | Admitting: Family Medicine

## 2016-08-03 LAB — URINE CULTURE: ORGANISM ID, BACTERIA: NO GROWTH

## 2016-08-04 ENCOUNTER — Ambulatory Visit (INDEPENDENT_AMBULATORY_CARE_PROVIDER_SITE_OTHER): Payer: Self-pay | Admitting: Thoracic Surgery (Cardiothoracic Vascular Surgery)

## 2016-08-04 ENCOUNTER — Encounter: Payer: Self-pay | Admitting: Thoracic Surgery (Cardiothoracic Vascular Surgery)

## 2016-08-04 VITALS — BP 104/68 | HR 91 | Resp 16 | Ht 68.0 in | Wt 132.8 lb

## 2016-08-04 DIAGNOSIS — R222 Localized swelling, mass and lump, trunk: Secondary | ICD-10-CM

## 2016-08-04 DIAGNOSIS — I5189 Other ill-defined heart diseases: Secondary | ICD-10-CM

## 2016-08-04 DIAGNOSIS — Z09 Encounter for follow-up examination after completed treatment for conditions other than malignant neoplasm: Secondary | ICD-10-CM

## 2016-08-04 NOTE — Patient Instructions (Signed)
You may continue to gradually increase your physical activity as tolerated.  Refrain from any heavy lifting or strenuous use of your arms and shoulders until at least 8 weeks from the time of your surgery, and avoid activities that cause increased pain in your chest on the side of your surgical incision.  Otherwise you may continue to increase activities without any particular limitations.  Increase the intensity and duration of physical activity gradually.  You may return to driving an automobile as long as you are no longer requiring oral narcotic pain relievers during the daytime.  It would be wise to start driving only short distances during the daylight and gradually increase from there as you feel comfortable.  You are encouraged to enroll and participate in the outpatient cardiac rehab program beginning as soon as practical.   

## 2016-08-04 NOTE — Progress Notes (Signed)
[address]       [place] [address]             [phone]     CARDIOTHORACIC SURGERY OFFICE NOTE  Referring Provider is Stanford Breed Denice Bors, MD PCP is Lucretia Kern., DO   HPI:  Patient is a 45 year old female with multiple medical problems who returns to our office today for routine follow-up approximately 3 weeks status post minimally invasive resection of large right atrial lipoma and closure of patent foramen ovale on 07/11/2016. Her early postoperative recovery was notable for the development of a Klebsiella urinary tract infection. She completed a course of oral Augmentin.  Her postoperative recovery has otherwise been uneventful.  She was seen in the office last week by Melvyn Neth at [facility].  Her dose of metoprolol was decreased to 12.5 mg twice daily because of some transient dizzy spells. She was also started on Bactrim at that time because the patient was worried about low-grade temperature (99.53F).  She was seen in the office the end of last week by her primary care physician. Follow-up urine culture was no growth. She was instructed to stop taking all antibiotics.  She returns to our office today and reports that overall she is doing quite well. She is walking more than 40 minutes every day. She notes that her breathing is considerably better than it was prior to surgery. She no longer feels the heaviness and orthopnea she experienced prior to surgery. She still has some mild tightness across her chest related to the surgery. She has not been taking any sort of pain relievers for the past 2 weeks. Appetite is fairly good. She is sleeping fairly well at night. She is not having palpitations or dizzy spells.   Current Outpatient Prescriptions  Medication Sig Dispense Refill  . acetaminophen (TYLENOL) 650 MG CR tablet Take 650 mg by mouth daily as needed for pain.    Marland Kitchen albuterol (PROVENTIL HFA;VENTOLIN HFA) 108 (90 BASE) MCG/ACT inhaler Inhale 2  puffs into the lungs every 6 (six) hours as needed for wheezing.    Marland Kitchen aspirin EC 325 MG EC tablet Take 1 tablet (325 mg total) by mouth daily. 30 tablet 0  . diclofenac sodium (VOLTAREN) 1 % GEL Apply 2 g topically 4 (four) times daily as needed. 100 g 1  . docusate sodium (COLACE) 100 MG capsule Take 100 mg by mouth daily as needed for moderate constipation. Reported on 03/12/2016    . fluticasone (FLONASE) 50 MCG/ACT nasal spray Place 2 sprays into both nostrils daily. 16 g 6  . metoprolol tartrate (LOPRESSOR) 25 MG tablet Take 0.5 tablets (12.5 mg total) by mouth 2 (two) times daily. 60 tablet 1  . nystatin-triamcinolone ointment (MYCOLOG) Apply 1 application topically 2 (two) times daily. 45 g 0  . omeprazole (PRILOSEC) 20 MG capsule Take 20 mg by mouth 2 (two) times daily before a meal.    . OVER THE COUNTER MEDICATION Take 1 capsule by mouth daily. Patient takes methylated folate.    . polyethylene glycol (MIRALAX / GLYCOLAX) packet Take 17 g by mouth daily as needed for mild constipation.    . predniSONE (DELTASONE) 20 MG tablet 60 mg po evening prior to CT scan and 60 mg po morning of CT scan, along with Benadryl 50 mg morning of CT Scan 6 tablet 0  . sulfamethoxazole-trimethoprim (BACTRIM DS,SEPTRA DS) 800-160 MG tablet Take 1 tablet by mouth 2 (two) times daily. For  7 days. 14 tablet 0  . vitamin B-12 (CYANOCOBALAMIN) 1000 MCG tablet Take 1,000 mcg by mouth daily.     No current facility-administered medications for this visit.       Physical Exam:   BP 104/68   Pulse 91   Resp 16   Ht 5\' 8"  (1.727 m)   Wt 132 lb 12.8 oz (60.2 kg)   LMP 05/20/2014   SpO2 99% Comment: ON RA  BMI 20.19 kg/m   General:  Well-appearing  Chest:   Clear to auscultation  CV:   Regular rate and rhythm  Incisions:  Healing nicely  Abdomen:  Soft nontender  Extremities:  Warm and well-perfused  Diagnostic Tests:  CHEST  2 VIEW  COMPARISON:  07/15/2016  FINDINGS: Resolution of right  pneumothorax. Resolution of bilateral pleural effusions. Mild linear density right middle lobe improved in due to atelectasis.  Heart size normal. Negative for heart failure. Negative for pneumonia.  IMPRESSION: Interval resolution of right pneumothorax and small bilateral effusions. Improved lung aeration in the right middle lobe.   Electronically Signed   By: Franchot Gallo M.D.   On: 08/01/2016 15:13   Impression:  Patient is doing well approximately 3-1/2 weeks status post minimally invasive resection of large right atrial lipoma.   Plan:  I have encouraged the patient to continue to gradually increase her physical activity as tolerated with her primary limitation remaining that she refrain from heavy lifting or strenuous use of her arms or shoulders for at least another 4-6 weeks. I think she may resume driving an automobile. I think she would benefit from enrolling in participation in the outpatient cardiac rehabilitation program. I see no reason for her to remain on aspirin at this time. The patient will return in 6 weeks for follow-up. All of her questions have been addressed.    Valentina Gu. Roxy Manns, MD 08/04/2016 10:21 AM

## 2016-08-19 ENCOUNTER — Other Ambulatory Visit: Payer: Self-pay | Admitting: *Deleted

## 2016-08-25 ENCOUNTER — Other Ambulatory Visit: Payer: Self-pay

## 2016-08-25 ENCOUNTER — Ambulatory Visit (HOSPITAL_COMMUNITY): Payer: 59 | Attending: Cardiology

## 2016-08-25 DIAGNOSIS — I5189 Other ill-defined heart diseases: Secondary | ICD-10-CM

## 2016-08-25 DIAGNOSIS — I059 Rheumatic mitral valve disease, unspecified: Secondary | ICD-10-CM | POA: Insufficient documentation

## 2016-08-25 DIAGNOSIS — I519 Heart disease, unspecified: Secondary | ICD-10-CM | POA: Diagnosis not present

## 2016-09-01 ENCOUNTER — Other Ambulatory Visit: Payer: Self-pay

## 2016-09-01 DIAGNOSIS — I10 Essential (primary) hypertension: Secondary | ICD-10-CM

## 2016-09-01 MED ORDER — METOPROLOL TARTRATE 25 MG PO TABS: 12.5000 mg | ORAL_TABLET | Freq: Two times a day (BID) | ORAL | 1 refills | Status: DC

## 2016-09-01 NOTE — Telephone Encounter (Signed)
RX/ refill sent to [facility] pharm via epic metoprolol tartrate 25 mg take 1/2 tab bid.. Monitor BP's daily and keep written record.

## 2016-09-02 ENCOUNTER — Encounter: Payer: Self-pay | Admitting: Cardiology

## 2016-09-04 ENCOUNTER — Telehealth: Payer: Self-pay | Admitting: Cardiology

## 2016-09-04 NOTE — Telephone Encounter (Signed)
New message  How long should pt be on lopressor  Lopressor was refilled post op  How many times should her BP be checked  What time to check Bp/activity etc

## 2016-09-04 NOTE — Telephone Encounter (Signed)
Returned call to patient.She seemed very anxious.Stated she wanted to know how many times she should be checking her B/P.Stated she has a home B/P cuff and has been checking B/P.Stated yesterday B/P 91/63 pulse 90.She felt bad most of day, no energy.B/P has been 105/68 p 89,103/66 p 99,110/79 p 95.Today 112/72 p 85.Stated she feels better today.Stated she is disappointment she has not started rehab yet, but has rehab orientation 09/11/16.Advised to check B/P and pulse daily,keep a diary of readings.Alternate checking in am and pm.Advised if systolic B/P consistently  runs under 100 to call back.Advised to continue metoprolol 25 mg 1/2 tablet twice a day. Patient reassured.Advised to keep appointment with Dr.Crenshaw 09/16/16 at 4:00 pm.Call sooner if needed.Message sent to Dr.Crensahw for review.

## 2016-09-10 ENCOUNTER — Encounter (HOSPITAL_COMMUNITY): Payer: Self-pay

## 2016-09-11 ENCOUNTER — Encounter (HOSPITAL_COMMUNITY)
Admission: RE | Admit: 2016-09-11 | Discharge: 2016-09-11 | Disposition: A | Discharge status: Home / Self Care | Payer: 59 | Source: Ambulatory Visit | Attending: Cardiology | Admitting: Cardiology

## 2016-09-11 ENCOUNTER — Encounter (HOSPITAL_COMMUNITY): Payer: Self-pay

## 2016-09-11 VITALS — BP 104/70 | HR 98 | Ht 68.0 in | Wt 134.3 lb

## 2016-09-11 DIAGNOSIS — I519 Heart disease, unspecified: Secondary | ICD-10-CM | POA: Diagnosis present

## 2016-09-11 DIAGNOSIS — Z9889 Other specified postprocedural states: Secondary | ICD-10-CM | POA: Insufficient documentation

## 2016-09-11 DIAGNOSIS — Z48812 Encounter for surgical aftercare following surgery on the circulatory system: Secondary | ICD-10-CM | POA: Insufficient documentation

## 2016-09-11 DIAGNOSIS — I5189 Other ill-defined heart diseases: Secondary | ICD-10-CM

## 2016-09-11 NOTE — Progress Notes (Signed)
Cardiac Rehab Medication Review by a Pharmacist  Does the patient  feel that his/her medications are working for him/her?  yes  Has the patient been experiencing any side effects to the medications prescribed?  Yes - chest squeezing and fatigue   Does the patient measure his/her own blood pressure or blood glucose at home?  yes - typically runs low  Does the patient have any problems obtaining medications due to transportation or finances?   no  Understanding of regimen: excellent Understanding of indications: excellent Potential of compliance: excellent   Pharmacist comments: Emily Joseph presents to cardiac rehab in good spirits with no assistance. She has no concerns about her medications, and feels that they are necessary for her. She would like to address length of therapy with her cardiologist. Overall, she has a good understanding of her regimen.  [name], PharmD PGY1 Pharmacy Resident [phone] (Pager) 09/11/2016 9:39 AM

## 2016-09-11 NOTE — Progress Notes (Signed)
Cardiac Individual Treatment Plan  Patient Details  Name: Emily Joseph MRN: [id number] Date of Birth: Jun 08, 1971 Referring Provider:   Flowsheet Row CARDIAC REHAB PHASE II ORIENTATION from 09/11/2016 in [place]  Referring Provider  Kirk Ruths MD      Initial Encounter Date:  [place] PHASE II ORIENTATION from 09/11/2016 in [place]  Date  09/11/16  Referring Provider  Kirk Ruths MD      Visit Diagnosis: 07/11/16 s/p minimally invasive resection of right atrial lipoma  Patient's Home Medications on Admission:  Current Outpatient Prescriptions:  .  acetaminophen (TYLENOL) 650 MG CR tablet, Take 650 mg by mouth daily as needed for pain., Disp: , Rfl:  .  albuterol (PROVENTIL HFA;VENTOLIN HFA) 108 (90 BASE) MCG/ACT inhaler, Inhale 2 puffs into the lungs every 6 (six) hours as needed for wheezing., Disp: , Rfl:  .  diclofenac sodium (VOLTAREN) 1 % GEL, Apply 2 g topically 4 (four) times daily as needed., Disp: 100 g, Rfl: 1 .  fluticasone (FLONASE) 50 MCG/ACT nasal spray, Place 2 sprays into both nostrils daily., Disp: 16 g, Rfl: 6 .  metoprolol tartrate (LOPRESSOR) 25 MG tablet, Take 0.5 tablets (12.5 mg total) by mouth 2 (two) times daily., Disp: 60 tablet, Rfl: 1 .  docusate sodium (COLACE) 100 MG capsule, Take 100 mg by mouth daily as needed for moderate constipation. Reported on 03/12/2016, Disp: , Rfl:  .  omeprazole (PRILOSEC) 20 MG capsule, Take 20 mg by mouth 2 (two) times daily before a meal., Disp: , Rfl:  .  OVER THE COUNTER MEDICATION, Take 1 capsule by mouth daily. Patient takes methylated folate., Disp: , Rfl:  .  polyethylene glycol (MIRALAX / GLYCOLAX) packet, Take 17 g by mouth daily as needed for mild constipation., Disp: , Rfl:  .  vitamin B-12 (CYANOCOBALAMIN) 1000 MCG tablet, Take 1,000 mcg by mouth daily., Disp: , Rfl:   Past Medical History: Past Medical History:   Diagnosis Date  . Adenomyosis    not papanicolaou smear of cervix and cervical HPV  . Anxiety 06/08/2016   -about her health and about taking medications  . Arthritis    ?  Marland Kitchen Asthma   . 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 Chronic fatigue 06/08/2016   -eval with rheum x2, neurology, gastroenterology  . Chronic pain 06/08/2016   -all over her whole life; joints, muscles, head -numerous evaluations, Dr. Trudie Reed in 2017, [place] rheum -seeing Dr. Jaynee Eagles, Neurologist for back pain with radicular symptoms  . Complication of anesthesia    "with epidural bp bottoms out"  . Dysrhythmia    fast hr  . Eosinophilic esophagitis    Sees Dr. Carlean Purl  . Fibromyalgia   . GERD (gastroesophageal reflux disease)   . Heart murmur   . History of pneumonia    when pt was pregnant  . Hypertension   . Iron deficiency anemia due to chronic blood loss    Menorrhagia, s/p complete hysterectomy, ovaries remain hx  . Irritable bowel syndrome 01/27/2013   Dr Carlean Purl 02/2016   . Microhematuria   . Mouth problem    failed gum graft 1/16  . Nasal airway abnormality   . Nasal obstruction 06/26/2015   Seen by ENT 06-26-15.  May need sleep study to see if having apnea    . Palpitations   . s/p minimally invasive resection of right  atrial lipoma    4 cm mass in right atrium c/w benign cardiac lipoma  . Shortness of breath dyspnea   . SUI (stress urinary incontinence, female)   . Syncope    with last child with epideral  . UTI (urinary tract infection) 07/14/2016   >=100,000 COLONIES/mL KLEBSIELLA PNEUMONIA    Tobacco Use: History  Smoking Status  . Never Smoker  Smokeless Tobacco  . Never Used    Labs: Recent Review Flowsheet Data    Labs for ITP Cardiac and Pulmonary Rehab Latest Ref Rng & Units 07/11/2016 07/11/2016 07/11/2016 07/11/2016 07/12/2016   Hemoglobin A1c 4.8 - 5.6 % - - - - -   PHART 7.350 - 7.450 7.320(L) 7.362 7.340(L) - -   PCO2ART 32.0 - 48.0 mmHg 47.3 37.0 40.6 - -    HCO3 20.0 - 28.0 mmol/L 24.7 21.2 22.0 - -   TCO2 0 - 100 mmol/L 26 22 23 23 25    ACIDBASEDEF 0.0 - 2.0 mmol/L 2.0 4.0(H) 4.0(H) - -   O2SAT % 100.0 100.0 99.0 - -      Capillary Blood Glucose: Lab Results  Component Value Date   GLUCAP 128 (H) 07/13/2016   GLUCAP 114 (H) 07/13/2016   GLUCAP 125 (H) 07/13/2016   GLUCAP 134 (H) 07/12/2016   GLUCAP 149 (H) 07/12/2016     Exercise Target Goals: Date: 09/11/16  Exercise Program Goal: Individual exercise prescription set with THRR, safety & activity barriers. Participant demonstrates ability to understand and report RPE using BORG scale, to self-measure pulse accurately, and to acknowledge the importance of the exercise prescription.  Exercise Prescription Goal: Starting with aerobic activity 30 plus minutes a day, 3 days per week for initial exercise prescription. Provide home exercise prescription and guidelines that participant acknowledges understanding prior to discharge.  Activity Barriers & Risk Stratification:     Activity Barriers & Cardiac Risk Stratification - 09/11/16 0905      Activity Barriers & Cardiac Risk Stratification   Activity Barriers Other (comment)   Comments wears orthotic in left shoe, right hip pain, right should and neck pain    Cardiac Risk Stratification Low      6 Minute Walk:     6 Minute Walk    Row Name 09/11/16 1346         6 Minute Walk   Phase Initial     Distance 1400 feet     Walk Time 6 minutes     # of Rest Breaks 0     MPH 2.65     METS 4.75     RPE 12     VO2 Peak 16.64     Symptoms No     Resting HR 98 bpm     Resting BP 104/70     Max Ex. HR 112 bpm     Max Ex. BP 102/70     2 Minute Post BP 103/71        Initial Exercise Prescription:     Initial Exercise Prescription - 09/11/16 1400      Date of Initial Exercise RX and Referring Provider   Date 09/11/16   Referring Provider Kirk Ruths MD     Treadmill   MPH 2.6   Grade 0   Minutes 10   METs  2.99     Bike   Level 0.5   Minutes 10   METs 2.57     NuStep   Level 3   Minutes 10   METs  2     Prescription Details   Frequency (times per week) 3   Duration Progress to 30 minutes of continuous aerobic without signs/symptoms of physical distress     Intensity   THRR 40-80% of Max Heartrate 70-140   Ratings of Perceived Exertion 11-13     Progression   Progression Continue to progress workloads to maintain intensity without signs/symptoms of physical distress.     Resistance Training   Training Prescription Yes   Weight 2lbs   Reps 10-12      Perform Capillary Blood Glucose checks as needed.  Exercise Prescription Changes:   Exercise Comments:   Discharge Exercise Prescription (Final Exercise Prescription Changes):   Nutrition:  Target Goals: Understanding of nutrition guidelines, daily intake of sodium 1500mg , cholesterol 200mg , calories 30% from fat and 7% or less from saturated fats, daily to have 5 or more servings of fruits and vegetables.  Biometrics:     Pre Biometrics - 09/11/16 1403      Pre Biometrics   Waist Circumference 30.75 inches   Hip Circumference 38.5 inches   Waist to Hip Ratio 0.8 %   Triceps Skinfold 34 mm   % Body Fat 32.8 %   Grip Strength 30 kg   Flexibility 13 in   Single Leg Stand 30 seconds       Nutrition Therapy Plan and Nutrition Goals:   Nutrition Discharge: Nutrition Scores:   Nutrition Goals Re-Evaluation:   Psychosocial: Target Goals: Acknowledge presence or absence of depression, maximize coping skills, provide positive support system. Participant is able to verbalize types and ability to use techniques and skills needed for reducing stress and depression.  Initial Review & Psychosocial Screening:     Initial Psych Review & Screening - 09/11/16 [place]? Yes   Comments Brief psychosocial assessment reveals no needs, no further intervention at this time.      Screening Interventions   Interventions Encouraged to exercise      Quality of Life Scores:     Quality of Life - 09/11/16 1405      Quality of Life Scores   Health/Function Pre 11.89 %   Socioeconomic Pre 22.31 %   Psych/Spiritual Pre 23.57 %   Family Pre 27.3 %   GLOBAL Pre 19.23 %      PHQ-9: Recent Review Flowsheet Data    Depression screen [facility] 2/9 03/26/2016 03/12/2016 10/09/2015 10/03/2015 09/18/2015   Decreased Interest 0 0 0 0 0   Down, Depressed, Hopeless 0 0 0 0 0   PHQ - 2 Score 0 0 0 0 0      Psychosocial Evaluation and Intervention:   Psychosocial Re-Evaluation:   Vocational Rehabilitation: Provide vocational rehab assistance to qualifying candidates.   Vocational Rehab Evaluation & Intervention:     Vocational Rehab - 09/11/16 1833      Initial Vocational Rehab Evaluation & Intervention   Assessment shows need for Vocational Rehabilitation No  Pt does not plan to return back to work.      Education: Education Goals: Education classes will be provided on a weekly basis, covering required topics. Participant will state understanding/return demonstration of topics presented.  Learning Barriers/Preferences:     Learning Barriers/Preferences - 09/10/16 1019      Learning Barriers/Preferences   Learning Barriers None   Learning Preferences None      Education Topics: Count Your Pulse:  -Group instruction provided by verbal instruction, demonstration, patient  participation and written materials to support subject.  Instructors address importance of being able to find your pulse and how to count your pulse when at home without a heart monitor.  Patients get hands on experience counting their pulse with staff help and individually.   Heart Attack, Angina, and Risk Factor Modification:  -Group instruction provided by verbal instruction, video, and written materials to support subject.  Instructors address signs and symptoms of angina and heart  attacks.    Also discuss risk factors for heart disease and how to make changes to improve heart health risk factors.   Functional Fitness:  -Group instruction provided by verbal instruction, demonstration, patient participation, and written materials to support subject.  Instructors address safety measures for doing things around the house.  Discuss how to get up and down off the floor, how to pick things up properly, how to safely get out of a chair without assistance, and balance training.   Meditation and Mindfulness:  -Group instruction provided by verbal instruction, patient participation, and written materials to support subject.  Instructor addresses importance of mindfulness and meditation practice to help reduce stress and improve awareness.  Instructor also leads participants through a meditation exercise.    Stretching for Flexibility and Mobility:  -Group instruction provided by verbal instruction, patient participation, and written materials to support subject.  Instructors lead participants through series of stretches that are designed to increase flexibility thus improving mobility.  These stretches are additional exercise for major muscle groups that are typically performed during regular warm up and cool down.   Hands Only CPR Anytime:  -Group instruction provided by verbal instruction, video, patient participation and written materials to support subject.  Instructors co-teach with AHA video for hands only CPR.  Participants get hands on experience with mannequins.   Nutrition I class: Heart Healthy Eating:  -Group instruction provided by PowerPoint slides, verbal discussion, and written materials to support subject matter. The instructor gives an explanation and review of the Therapeutic Lifestyle Changes diet recommendations, which includes a discussion on lipid goals, dietary fat, sodium, fiber, plant stanol/sterol esters, sugar, and the components of a well-balanced,  healthy diet.   Nutrition II class: Lifestyle Skills:  -Group instruction provided by PowerPoint slides, verbal discussion, and written materials to support subject matter. The instructor gives an explanation and review of label reading, grocery shopping for heart health, heart healthy recipe modifications, and ways to make healthier choices when eating out.   Diabetes Question & Answer:  -Group instruction provided by PowerPoint slides, verbal discussion, and written materials to support subject matter. The instructor gives an explanation and review of diabetes co-morbidities, pre- and post-prandial blood glucose goals, pre-exercise blood glucose goals, signs, symptoms, and treatment of hypoglycemia and hyperglycemia, and foot care basics.   Diabetes Blitz:  -Group instruction provided by PowerPoint slides, verbal discussion, and written materials to support subject matter. The instructor gives an explanation and review of the physiology behind type 1 and type 2 diabetes, diabetes medications and rational behind using different medications, pre- and post-prandial blood glucose recommendations and Hemoglobin A1c goals, diabetes diet, and exercise including blood glucose guidelines for exercising safely.    Portion Distortion:  -Group instruction provided by PowerPoint slides, verbal discussion, written materials, and food models to support subject matter. The instructor gives an explanation of serving size versus portion size, changes in portions sizes over the last 20 years, and what consists of a serving from each food group.   Stress Management:  -Group  instruction provided by verbal instruction, video, and written materials to support subject matter.  Instructors review role of stress in heart disease and how to cope with stress positively.     Exercising on Your Own:  -Group instruction provided by verbal instruction, power point, and written materials to support subject.  Instructors  discuss benefits of exercise, components of exercise, frequency and intensity of exercise, and end points for exercise.  Also discuss use of nitroglycerin and activating EMS.  Review options of places to exercise outside of rehab.  Review guidelines for sex with heart disease.   Cardiac Drugs I:  -Group instruction provided by verbal instruction and written materials to support subject.  Instructor reviews cardiac drug classes: antiplatelets, anticoagulants, beta blockers, and statins.  Instructor discusses reasons, side effects, and lifestyle considerations for each drug class.   Cardiac Drugs II:  -Group instruction provided by verbal instruction and written materials to support subject.  Instructor reviews cardiac drug classes: angiotensin converting enzyme inhibitors (ACE-I), angiotensin II receptor blockers (ARBs), nitrates, and calcium channel blockers.  Instructor discusses reasons, side effects, and lifestyle considerations for each drug class.   Anatomy and Physiology of the Circulatory System:  -Group instruction provided by verbal instruction, video, and written materials to support subject.  Reviews functional anatomy of heart, how it relates to various diagnoses, and what role the heart plays in the overall system.   Knowledge Questionnaire Score:     Knowledge Questionnaire Score - 09/11/16 1346      Knowledge Questionnaire Score   Pre Score 24/24      Core Components/Risk Factors/Patient Goals at Admission:     Personal Goals and Risk Factors at Admission - 09/11/16 0917      Core Components/Risk Factors/Patient Goals on Admission   Increase Strength and Stamina Yes   Intervention Provide advice, education, support and counseling about physical activity/exercise needs.;Develop an individualized exercise prescription for aerobic and resistive training based on initial evaluation findings, risk stratification, comorbidities and participant's personal goals.   Expected  Outcomes Achievement of increased cardiorespiratory fitness and enhanced flexibility, muscular endurance and strength shown through measurements of functional capacity and personal statement of participant.   Hypertension Yes   Intervention Provide education on lifestyle modifcations including regular physical activity/exercise, weight management, moderate sodium restriction and increased consumption of fresh fruit, vegetables, and low fat dairy, alcohol moderation, and smoking cessation.;Monitor prescription use compliance.   Expected Outcomes Short Term: Continued assessment and intervention until BP is < 140/48mm HG in hypertensive participants. < 130/78mm HG in hypertensive participants with diabetes, heart failure or chronic kidney disease.;Long Term: Maintenance of blood pressure at goal levels.   Stress Yes   Intervention Offer individual and/or small group education and counseling on adjustment to heart disease, stress management and health-related lifestyle change. Teach and support self-help strategies.;Refer participants experiencing significant psychosocial distress to appropriate mental health specialists for further evaluation and treatment. When possible, include family members and significant others in education/counseling sessions.   Expected Outcomes Short Term: Participant demonstrates changes in health-related behavior, relaxation and other stress management skills, ability to obtain effective social support, and compliance with psychotropic medications if prescribed.;Long Term: Emotional wellbeing is indicated by absence of clinically significant psychosocial distress or social isolation.   Personal Goal Other Yes   Personal Goal increase stamina, be able to do more with family, decrease anxiety   Intervention individualized exercise prescription, education classes, relaxation and meditation classes   Expected Outcomes increase overall functional capacity and ability to  be active with  family      Core Components/Risk Factors/Patient Goals Review:    Core Components/Risk Factors/Patient Goals at Discharge (Final Review):    ITP Comments:     ITP Comments    Row Name 09/10/16 1023 09/11/16 0920         ITP Comments Dr.Traci Radford Pax, Medical Director  Dr.Traci Turner, Medical Director          Comments:  Pt in today for cardiac rehab orientation from 0800 - 1100.  As a part of the orientation appointment, pt completed warm up stretches and 6 minute walk test.  Pt did not have her medications yet this morning and needed to sit down for her HR to decrease from 106 to below 100 so the walk test could be completed.  Pt advised she would need to have her medications prior to full exercise next week.  Monitor showed SR ST with rare PAC. Pt denies any discomfort but indicated she needed to work on her breathing. Brief psychosocial assessment reveals no immediate needs.  Will review QOL survey results with pt during full exercise.  Pt is excited "to feel better" by participating in cardiac rehab. Cherre Huger, BSN
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
## 2016-09-15 ENCOUNTER — Encounter (HOSPITAL_COMMUNITY)
Admission: RE | Admit: 2016-09-15 | Discharge: 2016-09-15 | Disposition: A | Discharge status: Home / Self Care | Payer: 59 | Source: Ambulatory Visit | Attending: Cardiology | Admitting: Cardiology

## 2016-09-15 ENCOUNTER — Ambulatory Visit (INDEPENDENT_AMBULATORY_CARE_PROVIDER_SITE_OTHER): Payer: Self-pay | Admitting: Thoracic Surgery (Cardiothoracic Vascular Surgery)

## 2016-09-15 ENCOUNTER — Encounter: Payer: Self-pay | Admitting: Thoracic Surgery (Cardiothoracic Vascular Surgery)

## 2016-09-15 VITALS — BP 109/72 | HR 90 | Resp 20 | Ht 68.0 in | Wt 134.0 lb

## 2016-09-15 DIAGNOSIS — I5189 Other ill-defined heart diseases: Secondary | ICD-10-CM

## 2016-09-15 DIAGNOSIS — Z09 Encounter for follow-up examination after completed treatment for conditions other than malignant neoplasm: Secondary | ICD-10-CM

## 2016-09-15 DIAGNOSIS — Z48812 Encounter for surgical aftercare following surgery on the circulatory system: Secondary | ICD-10-CM | POA: Diagnosis not present

## 2016-09-15 DIAGNOSIS — I519 Heart disease, unspecified: Secondary | ICD-10-CM

## 2016-09-15 NOTE — Progress Notes (Signed)
Daily Session Note  Patient Details  Name: Emily Joseph MRN: [id number] Date of Birth: [birth year] Referring Provider:   Flowsheet Row CARDIAC REHAB PHASE II ORIENTATION from 09/11/2016 in [facility]  Referring Provider  Kirk Ruths MD      Encounter Date: 09/15/2016  Check In:     Session Check In - 09/15/16 1158      Check-In   Location [facility]   Staff Present Maurice Small, RN, BSN;Joann Rion, RN, Marga Melnick, RN, [facility], MS, ACSM RCEP, Exercise Physiologist   Supervising physician immediately available to respond to emergencies [facility] immediately available   Physician(s) Dr. Lonny Prude   Medication changes reported     No   Fall or balance concerns reported    No   Warm-up and Cool-down Performed as group-led instruction   Resistance Training Performed Yes   VAD Patient? No     Pain Assessment   Currently in Pain? No/denies      Capillary Blood Glucose: No results found for this or any previous visit (from the past 24 hour(s)).   Goals Met:  Exercise tolerated well Personal goals reviewed  Goals Unmet:  Not Applicable  Comments:  Pt started cardiac rehab today.  Pt tolerated light exercise without difficulty. VSS, telemetry-SR,ST, asymptomatic.  Medication list reconciled. Pt denies barriers to medication compliance.  PSYCHOSOCIAL ASSESSMENT:  PHQ-0. Pt exhibits positive coping skills, hopeful outlook with supportive family. Pt list her husband, children,siste and friends as a support system. Pt for a long time did not know what was going on with her health specifically, but knew that something was off.  Pt is excited to participate in cardiac rehab  No psychosocial needs identified at this time, no psychosocial interventions necessary.    Pt enjoys double dating with husband, walking, taking care of family-going to kids events and pottery and painting.  Pt is looking forward to the  increase energy so that she may participate in her children's life. This is something that she has not been able to do for some time.  Pt desires to increase stamina, be able to walk 4 miles, biking trails with family and be able to drive.  Pt long term goal is to learn how to relax and decrease anxiety.  Will do frequent periodic check in with pt regarding achieving these goals.  Pt oriented to exercise equipment and routine.    Understanding verbalized. Maurice Small RN, BSN     Dr. Fransico Him is Medical Director for Cardiac Rehab at [facility].

## 2016-09-15 NOTE — Progress Notes (Signed)
[address]       [place] [address]             [phone]     CARDIOTHORACIC SURGERY OFFICE NOTE  Referring Provider is Lelon Perla, MD PCP is Lucretia Kern., DO   HPI:  Patient returns to the office today for routine follow-up status post minimally invasive resection of large right atrial lipoma and closure of patent foramen ovale on 07/11/2016. She was last seen here in our office on 08/04/2016 at which time she was doing well.  She returns to our office for routine follow-up today. She reports that she is doing quite well. She just started the cardiac rehabilitation program and she is enthusiastic about participation. She reports mild soreness in her right chest related to her mini thoracotomy. She has not been taking any sort of pain relievers for quite some time now, and the pain doesn't seem to limit her to much. She states that her exercise tolerance and stamina remains marginal but it is gradually improving. She notes that her breathing is much better than it was prior to surgery. Overall she is pleased with her progress.   Current Outpatient Prescriptions  Medication Sig Dispense Refill  . fluticasone (FLONASE) 50 MCG/ACT nasal spray Place 2 sprays into both nostrils daily. 16 g 6  . metoprolol tartrate (LOPRESSOR) 25 MG tablet Take 0.5 tablets (12.5 mg total) by mouth 2 (two) times daily. 60 tablet 1   No current facility-administered medications for this visit.       Physical Exam:   BP 109/72 (BP Location: Right Arm, Patient Position: Sitting, Cuff Size: Normal)   Pulse 90   Resp 20   Ht 5\' 8"  (1.727 m)   Wt 134 lb (60.8 kg)   LMP 05/20/2014   SpO2 99% Comment: RA  BMI 20.37 kg/m   General:  Well-appearing  Chest:   Clear to auscultation  CV:   Regular rate and rhythm without murmur  Incisions:  Healing nicely  Abdomen:  Soft nontender  Extremities:  Warm and well-perfused  Diagnostic Tests:  Transthoracic  Echocardiography  Patient:    Emily Joseph, Emily Joseph MR #:       [id number] Study Date: 08/25/2016 Gender:     F Age:        73 Height:     172.7 cm Weight:     60.2 kg BSA:        1.69 m^2 Pt. Status: Room:   [place], RDCS  PERFORMING   [facility], Outpatient  ORDERING     Ahmed Prima, [place] M  cc:  ------------------------------------------------------------------- LV EF: 55% -   60%  ------------------------------------------------------------------- Indications:      Atrial mass (I51.9).  ------------------------------------------------------------------- History:   Risk factors:  S/p resection of right atrial lipoma.  ------------------------------------------------------------------- Study Conclusions  - Left ventricle: The cavity size was normal. Wall thickness was   normal. Systolic function was normal. The estimated ejection   fraction was in the range of 55% to 60%. Wall motion was normal;   there were no regional wall motion abnormalities. Left   ventricular diastolic function parameters were normal. - Mitral valve: Calcified annulus.  Impressions:  - Normal LV function; no evidence of right atrial mass.  ------------------------------------------------------------------- Labs, prior tests, procedures, and surgery: Transthoracic echocardiography (06/23/2016).     EF was 55%. Right atrial  mass noted.  ------------------------------------------------------------------- Study data:  Comparison was made to the study of 06/23/2016.  Study status:  Routine.  Procedure:  The patient reported no pain pre or post test. Transthoracic echocardiography. Image quality was adequate.  Study completion:  There were no complications. Transthoracic echocardiography.  M-mode, complete 2D, spectral Doppler, and color Doppler.  Birthdate:  Patient birthdate: [birth year].  Age:  Patient is 45 yr  old.  Sex:  Gender: female. BMI: 20.2 kg/m^2.  Blood pressure:     104/68  Patient status: Outpatient.  Study date:  Study date: 08/25/2016. Study time: 09:34 AM.  Location:  [facility]  -------------------------------------------------------------------  ------------------------------------------------------------------- Left ventricle:  The cavity size was normal. Wall thickness was normal. Systolic function was normal. The estimated ejection fraction was in the range of 55% to 60%. Wall motion was normal; there were no regional wall motion abnormalities. The transmitral flow pattern was normal. The deceleration time of the early transmitral flow velocity was normal. Left ventricular diastolic function parameters were normal.  ------------------------------------------------------------------- Aortic valve:   Trileaflet; mildly calcified leaflets. Mobility was not restricted.  Doppler:  Transvalvular velocity was within the normal range. There was no stenosis. There was no regurgitation.   ------------------------------------------------------------------- Aorta:  Aortic root: The aortic root was normal in size.  ------------------------------------------------------------------- Mitral valve:   Calcified annulus. Mobility was not restricted. Doppler:  Transvalvular velocity was within the normal range. There was no evidence for stenosis. There was no regurgitation.  ------------------------------------------------------------------- Left atrium:  The atrium was normal in size.  ------------------------------------------------------------------- Right ventricle:  The cavity size was normal. Systolic function was normal.  ------------------------------------------------------------------- Pulmonic valve:    Doppler:  Transvalvular velocity was within the normal range. There was no evidence for  stenosis.  ------------------------------------------------------------------- Tricuspid valve:   Structurally normal valve.    Doppler: Transvalvular velocity was within the normal range. There was no regurgitation.  ------------------------------------------------------------------- Right atrium:  The atrium was normal in size.  ------------------------------------------------------------------- Pericardium:  There was no pericardial effusion.  ------------------------------------------------------------------- Systemic veins: Inferior vena cava: The vessel was mildly dilated.  ------------------------------------------------------------------- Measurements   Left ventricle                         Value        Reference  LV ID, ED, PLAX chordal        (L)     39.8  mm     43 - 52  LV ID, ES, PLAX chordal                28.3  mm     23 - 38  LV fx shortening, PLAX chordal         29    %      >=29  LV PW thickness, ED                    6.96  mm     ---------  IVS/LV PW ratio, ED                    1.08         <=1.3  Stroke volume, 2D                      56    ml     ---------  Stroke volume/bsa, 2D  33    ml/m^2 ---------  LV e&', lateral                         12.2  cm/s   ---------  LV E/e&', lateral                       5.33         ---------  LV e&', medial                          6.74  cm/s   ---------  LV E/e&', medial                        9.64         ---------  LV e&', average                         9.47  cm/s   ---------  LV E/e&', average                       6.86         ---------    Ventricular septum                     Value        Reference  IVS thickness, ED                      7.51  mm     ---------    LVOT                                   Value        Reference  LVOT ID, S                             19    mm     ---------  LVOT area                              2.84  cm^2   ---------  LVOT peak velocity, S                   106   cm/s   ---------  LVOT mean velocity, S                  71.3  cm/s   ---------  LVOT VTI, S                            19.8  cm     ---------    Aorta                                  Value        Reference  Aortic root ID, ED                     29    mm     ---------    Left atrium  Value        Reference  LA ID, A-P, ES                         21    mm     ---------  LA ID/bsa, A-P                         1.24  cm/m^2 <=2.2  LA volume, S                           22.5  ml     ---------  LA volume/bsa, S                       13.3  ml/m^2 ---------  LA volume, ES, 1-p A4C                 21.9  ml     ---------  LA volume/bsa, ES, 1-p A4C             12.9  ml/m^2 ---------  LA volume, ES, 1-p A2C                 23.4  ml     ---------  LA volume/bsa, ES, 1-p A2C             13.8  ml/m^2 ---------    Mitral valve                           Value        Reference  Mitral E-wave peak velocity            65    cm/s   ---------  Mitral A-wave peak velocity            55.8  cm/s   ---------  Mitral deceleration time               201   ms     150 - 230  Mitral E/A ratio, peak                 1.2          ---------    Systemic veins                         Value        Reference  Estimated CVP                          3     mm Hg  ---------    Right ventricle                        Value        Reference  TAPSE                                  6.53  mm     ---------  RV s&', lateral, S                      5.93  cm/s   ---------  Legend: (L)  and  (H)  mark values outside specified reference range.  -------------------------------------------------------------------  Prepared and Electronically Authenticated by  Kirk Ruths 2017-10-16T11:01:21   Impression:  Patient is doing well approximately 6 weeks following minimally invasive resection of large right atrial lipoma   Plan:  I have encouraged the patient to continue to gradually increase  her physical activity as tolerated with care to avoid any sort of heavy lifting pulling or pushing using her arms or shoulders for the next several weeks. Otherwise she may resume unrestricted physical activity. We have not recommended any changes to her current medications. The patient will return to our office for routine follow-up next September, approximately 1 year following her original surgery where she will call and return sooner only should specific problems or questions arise.    Valentina Gu. Roxy Manns, MD 09/15/2016 2:44 PM

## 2016-09-15 NOTE — Patient Instructions (Addendum)
You may continue to gradually increase your physical activity as tolerated.  Refrain from any heavy lifting or strenuous use of your arms and shoulders until at least 8 weeks from the time of your surgery, and avoid activities that cause increased pain in your chest on the side of your surgical incision.  Otherwise you may continue to increase activities without any particular limitations.  Increase the intensity and duration of physical activity gradually.  Continue all previous medications without any changes at this time

## 2016-09-16 ENCOUNTER — Ambulatory Visit (INDEPENDENT_AMBULATORY_CARE_PROVIDER_SITE_OTHER): Payer: 59 | Admitting: Cardiology

## 2016-09-16 ENCOUNTER — Encounter: Payer: Self-pay | Admitting: Cardiology

## 2016-09-16 VITALS — BP 100/66 | HR 78 | Ht 68.0 in | Wt 134.0 lb

## 2016-09-16 DIAGNOSIS — I519 Heart disease, unspecified: Secondary | ICD-10-CM | POA: Diagnosis not present

## 2016-09-16 DIAGNOSIS — R002 Palpitations: Secondary | ICD-10-CM

## 2016-09-16 DIAGNOSIS — I5189 Other ill-defined heart diseases: Secondary | ICD-10-CM

## 2016-09-16 NOTE — Patient Instructions (Signed)
Your physician wants you to follow-up in: ONE YEAR WITH DR CRENSHAW You will receive a reminder letter in the mail two months in advance. If you don't receive a letter, please call our office to schedule the follow-up appointment.   If you need a refill on your cardiac medications before your next appointment, please call your pharmacy.  

## 2016-09-17 ENCOUNTER — Encounter (HOSPITAL_COMMUNITY)
Admission: RE | Admit: 2016-09-17 | Discharge: 2016-09-17 | Disposition: A | Discharge status: Home / Self Care | Payer: 59 | Source: Ambulatory Visit | Attending: Cardiology | Admitting: Cardiology

## 2016-09-17 DIAGNOSIS — I5189 Other ill-defined heart diseases: Secondary | ICD-10-CM

## 2016-09-17 DIAGNOSIS — Z48812 Encounter for surgical aftercare following surgery on the circulatory system: Secondary | ICD-10-CM | POA: Diagnosis not present

## 2016-09-18 ENCOUNTER — Telehealth: Payer: Self-pay | Admitting: Cardiology

## 2016-09-18 NOTE — Telephone Encounter (Signed)
Pt notified, very happy to hear-she will ease into it.

## 2016-09-18 NOTE — Progress Notes (Signed)
Cardiac Individual Treatment Plan  Patient Details  Name: Emily Joseph MRN: [id number] Date of Birth: 1971/01/07 Referring Provider:   Flowsheet Row CARDIAC REHAB PHASE II ORIENTATION from 09/11/2016 in [place]  Referring Provider  Kirk Ruths MD      Initial Encounter Date:  [place] PHASE II ORIENTATION from 09/11/2016 in [facility]  Date  09/11/16  Referring Provider  Kirk Ruths MD      Visit Diagnosis: 07/11/16 s/p minimally invasive resection of right atrial lipoma  Patient's Home Medications on Admission:  Current Outpatient Prescriptions:  .  fluticasone (FLONASE) 50 MCG/ACT nasal spray, Place 2 sprays into both nostrils daily., Disp: 16 g, Rfl: 6 .  metoprolol tartrate (LOPRESSOR) 25 MG tablet, Take 0.5 tablets (12.5 mg total) by mouth 2 (two) times daily., Disp: 60 tablet, Rfl: 1  Past Medical History: Past Medical History:  Diagnosis Date  . Adenomyosis    not papanicolaou smear of cervix and cervical HPV  . Anxiety 06/08/2016   -about her health and about taking medications  . Arthritis    ?  Marland Kitchen Asthma   . 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 Chronic fatigue 06/08/2016   -eval with rheum x2, neurology, gastroenterology  . Chronic pain 06/08/2016   -all over her whole life; joints, muscles, head -numerous evaluations, Dr. Trudie Reed in 2017, [facility] -seeing Dr. Jaynee Eagles, Neurologist for back pain with radicular symptoms  . Complication of anesthesia    "with epidural bp bottoms out"  . Dysrhythmia    fast hr  . Eosinophilic esophagitis    Sees Dr. Carlean Purl  . Fibromyalgia   . GERD (gastroesophageal reflux disease)   . Heart murmur   . History of pneumonia    when pt was pregnant  . Hypertension   . Iron deficiency anemia due to chronic blood loss    Menorrhagia, s/p complete hysterectomy, ovaries remain hx  . Irritable bowel syndrome  01/27/2013   Dr Carlean Purl 02/2016   . Microhematuria   . Mouth problem    failed gum graft 1/16  . Nasal airway abnormality   . Nasal obstruction 06/26/2015   Seen by ENT 06-26-15.  May need sleep study to see if having apnea    . Palpitations   . 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 Shortness of breath dyspnea   . SUI (stress urinary incontinence, female)   . Syncope    with last child with epideral  . UTI (urinary tract infection) 07/14/2016   >=100,000 COLONIES/mL KLEBSIELLA PNEUMONIA    Tobacco Use: History  Smoking Status  . Never Smoker  Smokeless Tobacco  . Never Used    Labs: Recent Review Flowsheet Data    Labs for ITP Cardiac and Pulmonary Rehab Latest Ref Rng & Units 07/11/2016 07/11/2016 07/11/2016 07/11/2016 07/12/2016   Hemoglobin A1c 4.8 - 5.6 % - - - - -   PHART 7.350 - 7.450 7.320(L) 7.362 7.340(L) - -   PCO2ART 32.0 - 48.0 mmHg 47.3 37.0 40.6 - -   HCO3 20.0 - 28.0 mmol/L 24.7 21.2 22.0 - -   TCO2 0 - 100 mmol/L 26 22 23 23 25    ACIDBASEDEF 0.0 - 2.0 mmol/L 2.0 4.0(H) 4.0(H) - -   O2SAT % 100.0 100.0 99.0 - -  Capillary Blood Glucose: Lab Results  Component Value Date   GLUCAP 128 (H) 07/13/2016   GLUCAP 114 (H) 07/13/2016   GLUCAP 125 (H) 07/13/2016   GLUCAP 134 (H) 07/12/2016   GLUCAP 149 (H) 07/12/2016     Exercise Target Goals:    Exercise Program Goal: Individual exercise prescription set with THRR, safety & activity barriers. Participant demonstrates ability to understand and report RPE using BORG scale, to self-measure pulse accurately, and to acknowledge the importance of the exercise prescription.  Exercise Prescription Goal: Starting with aerobic activity 30 plus minutes a day, 3 days per week for initial exercise prescription. Provide home exercise prescription and guidelines that participant acknowledges understanding prior to discharge.  Activity Barriers & Risk  Stratification:     Activity Barriers & Cardiac Risk Stratification - 09/11/16 0905      Activity Barriers & Cardiac Risk Stratification   Activity Barriers Other (comment)   Comments wears orthotic in left shoe, right hip pain, right should and neck pain    Cardiac Risk Stratification Low      6 Minute Walk:     6 Minute Walk    Row Name 09/11/16 1346         6 Minute Walk   Phase Initial     Distance 1400 feet     Walk Time 6 minutes     # of Rest Breaks 0     MPH 2.65     METS 4.75     RPE 12     VO2 Peak 16.64     Symptoms No     Resting HR 98 bpm     Resting BP 104/70     Max Ex. HR 112 bpm     Max Ex. BP 102/70     2 Minute Post BP 103/71        Initial Exercise Prescription:     Initial Exercise Prescription - 09/11/16 1400      Date of Initial Exercise RX and Referring Provider   Date 09/11/16   Referring Provider Kirk Ruths MD     Treadmill   MPH 2.6   Grade 0   Minutes 10   METs 2.99     Bike   Level 0.5   Minutes 10   METs 2.57     NuStep   Level 3   Minutes 10   METs 2     Prescription Details   Frequency (times per week) 3   Duration Progress to 30 minutes of continuous aerobic without signs/symptoms of physical distress     Intensity   THRR 40-80% of Max Heartrate 70-140   Ratings of Perceived Exertion 11-13     Progression   Progression Continue to progress workloads to maintain intensity without signs/symptoms of physical distress.     Resistance Training   Training Prescription Yes   Weight 2lbs   Reps 10-12      Perform Capillary Blood Glucose checks as needed.  Exercise Prescription Changes:     Exercise Prescription Changes    Row Name 09/16/16 1000 09/17/16 1600           Exercise Review   Progression Yes Yes        Response to Exercise   Blood Pressure (Admit) 100/68 106/62      Blood Pressure (Exercise) 104/62 110/72      Blood Pressure (Exit) 98/60 101/69      Heart Rate (Admit) 89 bpm 82  bpm  Heart Rate (Exercise) 114 bpm 106 bpm      Heart Rate (Exit) 86 bpm 81 bpm      Rating of Perceived Exertion (Exercise) 14 13      Symptoms none none      Duration Progress to 30 minutes of continuous aerobic without signs/symptoms of physical distress Progress to 30 minutes of continuous aerobic without signs/symptoms of physical distress      Intensity THRR unchanged THRR unchanged        Progression   Average METs 2.4 2.5        Resistance Training   Training Prescription Yes Yes      Weight 2lbs 2lbs      Reps 10-12 10-12        Treadmill   MPH (P)  2  decreased speed per pt. tolerance/comfort level 2.3  decreased speed per pt. tolerance/comfort level      Grade 0 1      Minutes 10 10      METs 2.99 3.08        Bike   Level 0.5 0.5      Minutes 10 10      METs 2.55 2.55        NuStep   Level 3 3      Minutes 10 10      METs 1.7 1.9         Exercise Comments:     Exercise Comments    Row Name 09/16/16 1045           Exercise Comments Reduced TM speed per pt comfort level. Will continue to monitor exercise progression/tolerance          Discharge Exercise Prescription (Final Exercise Prescription Changes):     Exercise Prescription Changes - 09/17/16 1600      Exercise Review   Progression Yes     Response to Exercise   Blood Pressure (Admit) 106/62   Blood Pressure (Exercise) 110/72   Blood Pressure (Exit) 101/69   Heart Rate (Admit) 82 bpm   Heart Rate (Exercise) 106 bpm   Heart Rate (Exit) 81 bpm   Rating of Perceived Exertion (Exercise) 13   Symptoms none   Duration Progress to 30 minutes of continuous aerobic without signs/symptoms of physical distress   Intensity THRR unchanged     Progression   Average METs 2.5     Resistance Training   Training Prescription Yes   Weight 2lbs   Reps 10-12     Treadmill   MPH 2.3  decreased speed per pt. tolerance/comfort level   Grade 1   Minutes 10   METs 3.08     Bike   Level 0.5    Minutes 10   METs 2.55     NuStep   Level 3   Minutes 10   METs 1.9      Nutrition:  Target Goals: Understanding of nutrition guidelines, daily intake of sodium 1500mg , cholesterol 200mg , calories 30% from fat and 7% or less from saturated fats, daily to have 5 or more servings of fruits and vegetables.  Biometrics:     Pre Biometrics - 09/11/16 1403      Pre Biometrics   Waist Circumference 30.75 inches   Hip Circumference 38.5 inches   Waist to Hip Ratio 0.8 %   Triceps Skinfold 34 mm   % Body Fat 32.8 %   Grip Strength 30 kg   Flexibility 13 in   Single Leg Stand 30  seconds       Nutrition Therapy Plan and Nutrition Goals:   Nutrition Discharge: Nutrition Scores:   Nutrition Goals Re-Evaluation:   Psychosocial: Target Goals: Acknowledge presence or absence of depression, maximize coping skills, provide positive support system. Participant is able to verbalize types and ability to use techniques and skills needed for reducing stress and depression.  Initial Review & Psychosocial Screening:     Initial Psych Review & Screening - 09/11/16 [place]? Yes   Comments Brief psychosocial assessment reveals no needs, no further intervention at this time.     Screening Interventions   Interventions Encouraged to exercise      Quality of Life Scores:     Quality of Life - 09/11/16 1405      Quality of Life Scores   Health/Function Pre 11.89 %   Socioeconomic Pre 22.31 %   Psych/Spiritual Pre 23.57 %   Family Pre 27.3 %   GLOBAL Pre 19.23 %      PHQ-9: Recent Review Flowsheet Data    Depression screen [facility] 2/9 09/15/2016 03/26/2016 03/12/2016 10/09/2015 10/03/2015   Decreased Interest 0 0 0 0 0   Down, Depressed, Hopeless 0 0 0 0 0   PHQ - 2 Score 0 0 0 0 0      Psychosocial Evaluation and Intervention:     Psychosocial Evaluation - 09/18/16 1408      Psychosocial Evaluation & Interventions    Interventions Stress management education;Encouraged to exercise with the program and follow exercise prescription;Relaxation education   Comments continue to monitor.  Provide support and encouragement.     Discharge Psychosocial Assessment & Intervention   Discharge Continue support measures as needed      Psychosocial Re-Evaluation:     Psychosocial Re-Evaluation    Row Name 09/18/16 1409             Psychosocial Re-Evaluation   Interventions Stress management education;Relaxation education;Encouraged to attend Cardiac Rehabilitation for the exercise          Vocational Rehabilitation: Provide vocational rehab assistance to qualifying candidates.   Vocational Rehab Evaluation & Intervention:     Vocational Rehab - 09/11/16 1833      Initial Vocational Rehab Evaluation & Intervention   Assessment shows need for Vocational Rehabilitation No  Pt does not plan to return back to work.      Education: Education Goals: Education classes will be provided on a weekly basis, covering required topics. Participant will state understanding/return demonstration of topics presented.  Learning Barriers/Preferences:     Learning Barriers/Preferences - 09/10/16 1019      Learning Barriers/Preferences   Learning Barriers None   Learning Preferences None      Education Topics: Count Your Pulse:  -Group instruction provided by verbal instruction, demonstration, patient participation and written materials to support subject.  Instructors address importance of being able to find your pulse and how to count your pulse when at home without a heart monitor.  Patients get hands on experience counting their pulse with staff help and individually.   Heart Attack, Angina, and Risk Factor Modification:  -Group instruction provided by verbal instruction, video, and written materials to support subject.  Instructors address signs and symptoms of angina and heart attacks.    Also discuss  risk factors for heart disease and how to make changes to improve heart health risk factors.   Functional Fitness:  -Group instruction provided by verbal  instruction, demonstration, patient participation, and written materials to support subject.  Instructors address safety measures for doing things around the house.  Discuss how to get up and down off the floor, how to pick things up properly, how to safely get out of a chair without assistance, and balance training.   Meditation and Mindfulness:  -Group instruction provided by verbal instruction, patient participation, and written materials to support subject.  Instructor addresses importance of mindfulness and meditation practice to help reduce stress and improve awareness.  Instructor also leads participants through a meditation exercise.    Stretching for Flexibility and Mobility:  -Group instruction provided by verbal instruction, patient participation, and written materials to support subject.  Instructors lead participants through series of stretches that are designed to increase flexibility thus improving mobility.  These stretches are additional exercise for major muscle groups that are typically performed during regular warm up and cool down.   Hands Only CPR Anytime:  -Group instruction provided by verbal instruction, video, patient participation and written materials to support subject.  Instructors co-teach with AHA video for hands only CPR.  Participants get hands on experience with mannequins.   Nutrition I class: Heart Healthy Eating:  -Group instruction provided by PowerPoint slides, verbal discussion, and written materials to support subject matter. The instructor gives an explanation and review of the Therapeutic Lifestyle Changes diet recommendations, which includes a discussion on lipid goals, dietary fat, sodium, fiber, plant stanol/sterol esters, sugar, and the components of a well-balanced, healthy diet.   Nutrition II  class: Lifestyle Skills:  -Group instruction provided by PowerPoint slides, verbal discussion, and written materials to support subject matter. The instructor gives an explanation and review of label reading, grocery shopping for heart health, heart healthy recipe modifications, and ways to make healthier choices when eating out.   Diabetes Question & Answer:  -Group instruction provided by PowerPoint slides, verbal discussion, and written materials to support subject matter. The instructor gives an explanation and review of diabetes co-morbidities, pre- and post-prandial blood glucose goals, pre-exercise blood glucose goals, signs, symptoms, and treatment of hypoglycemia and hyperglycemia, and foot care basics.   Diabetes Blitz:  -Group instruction provided by PowerPoint slides, verbal discussion, and written materials to support subject matter. The instructor gives an explanation and review of the physiology behind type 1 and type 2 diabetes, diabetes medications and rational behind using different medications, pre- and post-prandial blood glucose recommendations and Hemoglobin A1c goals, diabetes diet, and exercise including blood glucose guidelines for exercising safely.    Portion Distortion:  -Group instruction provided by PowerPoint slides, verbal discussion, written materials, and food models to support subject matter. The instructor gives an explanation of serving size versus portion size, changes in portions sizes over the last 20 years, and what consists of a serving from each food group.   Stress Management:  -Group instruction provided by verbal instruction, video, and written materials to support subject matter.  Instructors review role of stress in heart disease and how to cope with stress positively.     Exercising on Your Own:  -Group instruction provided by verbal instruction, power point, and written materials to support subject.  Instructors discuss benefits of exercise,  components of exercise, frequency and intensity of exercise, and end points for exercise.  Also discuss use of nitroglycerin and activating EMS.  Review options of places to exercise outside of rehab.  Review guidelines for sex with heart disease.   Cardiac Drugs I:  -Group instruction provided by verbal instruction  and written materials to support subject.  Instructor reviews cardiac drug classes: antiplatelets, anticoagulants, beta blockers, and statins.  Instructor discusses reasons, side effects, and lifestyle considerations for each drug class.   Cardiac Drugs II:  -Group instruction provided by verbal instruction and written materials to support subject.  Instructor reviews cardiac drug classes: angiotensin converting enzyme inhibitors (ACE-I), angiotensin II receptor blockers (ARBs), nitrates, and calcium channel blockers.  Instructor discusses reasons, side effects, and lifestyle considerations for each drug class. Flowsheet Row CARDIAC REHAB PHASE II EXERCISE from 09/17/2016 in [place]  Date  09/17/16  Instruction Review Code  2- meets goals/outcomes      Anatomy and Physiology of the Circulatory System:  -Group instruction provided by verbal instruction, video, and written materials to support subject.  Reviews functional anatomy of heart, how it relates to various diagnoses, and what role the heart plays in the overall system.   Knowledge Questionnaire Score:     Knowledge Questionnaire Score - 09/11/16 1346      Knowledge Questionnaire Score   Pre Score 24/24      Core Components/Risk Factors/Patient Goals at Admission:     Personal Goals and Risk Factors at Admission - 09/11/16 0917      Core Components/Risk Factors/Patient Goals on Admission   Increase Strength and Stamina Yes   Intervention Provide advice, education, support and counseling about physical activity/exercise needs.;Develop an individualized exercise prescription for  aerobic and resistive training based on initial evaluation findings, risk stratification, comorbidities and participant's personal goals.   Expected Outcomes Achievement of increased cardiorespiratory fitness and enhanced flexibility, muscular endurance and strength shown through measurements of functional capacity and personal statement of participant.   Hypertension Yes   Intervention Provide education on lifestyle modifcations including regular physical activity/exercise, weight management, moderate sodium restriction and increased consumption of fresh fruit, vegetables, and low fat dairy, alcohol moderation, and smoking cessation.;Monitor prescription use compliance.   Expected Outcomes Short Term: Continued assessment and intervention until BP is < 140/53mm HG in hypertensive participants. < 130/50mm HG in hypertensive participants with diabetes, heart failure or chronic kidney disease.;Long Term: Maintenance of blood pressure at goal levels.   Stress Yes   Intervention Offer individual and/or small group education and counseling on adjustment to heart disease, stress management and health-related lifestyle change. Teach and support self-help strategies.;Refer participants experiencing significant psychosocial distress to appropriate mental health specialists for further evaluation and treatment. When possible, include family members and significant others in education/counseling sessions.   Expected Outcomes Short Term: Participant demonstrates changes in health-related behavior, relaxation and other stress management skills, ability to obtain effective social support, and compliance with psychotropic medications if prescribed.;Long Term: Emotional wellbeing is indicated by absence of clinically significant psychosocial distress or social isolation.   Personal Goal Other Yes   Personal Goal increase stamina, be able to do more with family, decrease anxiety   Intervention individualized exercise  prescription, education classes, relaxation and meditation classes   Expected Outcomes increase overall functional capacity and ability to be active with family      Core Components/Risk Factors/Patient Goals Review:    Core Components/Risk Factors/Patient Goals at Discharge (Final Review):    ITP Comments:     ITP Comments    Row Name 09/10/16 1023 09/11/16 0920         ITP Comments Dr.Traci Radford Pax, Medical Director  Dr.Traci Turner, Medical Director          Comments: Pt is making expected progress  toward personal goals after completing 3 sessions. Pt with long history of feeling shortness of breath and generally not feeling well.  Pt understands it will take time for her to feel "normal" again. Pt seen in the office for follow up with dr. Stanford Breed.  Pt with anxiety regarding her health and future.  Pt is a mother to four school aged children.  Continue to support and encourage pt.   Recommend continued exercise and life style modification education including  stress management and relaxation techniques to decrease cardiac risk profile. Cherre Huger, BSN

## 2016-09-18 NOTE — Telephone Encounter (Signed)
Spoke with pt she has questions about her diet, she is 9 weeks post-op atrial lipoma surgery. She is asking if it is ok now to drink caffeine and wine. She states that she was told immediatly post op she was told not to but, she would like to start again or should she still abstain? Please advise

## 2016-09-18 NOTE — Telephone Encounter (Signed)
Ok for wine and coffee Kirk Ruths

## 2016-09-18 NOTE — Telephone Encounter (Signed)
New message      Pt was seen on Tuesday.  She has questions to ask the nurse

## 2016-09-19 ENCOUNTER — Encounter (HOSPITAL_COMMUNITY)
Admission: RE | Admit: 2016-09-19 | Discharge: 2016-09-19 | Disposition: A | Discharge status: Home / Self Care | Payer: 59 | Source: Ambulatory Visit | Attending: Cardiology | Admitting: Cardiology

## 2016-09-19 DIAGNOSIS — Z48812 Encounter for surgical aftercare following surgery on the circulatory system: Secondary | ICD-10-CM | POA: Diagnosis not present

## 2016-09-19 DIAGNOSIS — I5189 Other ill-defined heart diseases: Secondary | ICD-10-CM

## 2016-09-19 NOTE — Progress Notes (Signed)
QUALITY OF LIFE SCORE REVIEW  Pt completed Quality of Life survey as a participant in Cardiac Rehab. Scores 21.0 or below are considered low. Pt score very low in several areas.. Pt scored as following     Quality of Life - 09/11/16 1405      Quality of Life Scores   Health/Function Pre 11.89 %   Socioeconomic Pre 22.31 %   Psych/Spiritual Pre 23.57 %   Family Pre 27.3 %   GLOBAL Pre 19.23 %    Pt attributes her scores to being low due to having medical issues for so long and that it took time to discover what the problem was.  Pt is patient with herself and knows it will take time for her to feel better.  Pt has supportive family which includes her four children and spouse.  Pt husband travels with his work so pt has extended family and friends to help pitch in when needed.  Pt feels positive about her future and knows it will take time.  Offered emotional support and reassurance.  Will continue to monitor and intervene as necessary. Cherre Huger, BSN

## 2016-09-22 ENCOUNTER — Encounter (HOSPITAL_COMMUNITY): Payer: 59

## 2016-09-24 ENCOUNTER — Encounter (HOSPITAL_COMMUNITY)
Admission: RE | Admit: 2016-09-24 | Discharge: 2016-09-24 | Disposition: A | Discharge status: Home / Self Care | Payer: 59 | Source: Ambulatory Visit | Attending: Cardiology | Admitting: Cardiology

## 2016-09-24 DIAGNOSIS — Z48812 Encounter for surgical aftercare following surgery on the circulatory system: Secondary | ICD-10-CM | POA: Diagnosis not present

## 2016-09-24 DIAGNOSIS — I5189 Other ill-defined heart diseases: Secondary | ICD-10-CM

## 2016-09-24 NOTE — Progress Notes (Signed)
Reviewed home exercise with pt today.  Pt plans to walk on treadmill for exercise. Pt will walk on Tues./Thurs. Doing 2 bouts of 10-15 minutes (incremently progressing exercise time pending tolerance)  Reviewed THR, pulse, RPE, sign and symptoms,and when to call 911 or MD.  Also discussed weather considerations and indoor options.  Pt voiced understanding.    Emily Joseph [employer]

## 2016-09-26 ENCOUNTER — Telehealth (HOSPITAL_COMMUNITY): Payer: Self-pay | Admitting: *Deleted

## 2016-09-26 ENCOUNTER — Encounter (HOSPITAL_COMMUNITY)
Admission: RE | Admit: 2016-09-26 | Discharge: 2016-09-26 | Disposition: A | Discharge status: Home / Self Care | Payer: 59 | Source: Ambulatory Visit | Attending: Cardiology | Admitting: Cardiology

## 2016-09-26 DIAGNOSIS — Z48812 Encounter for surgical aftercare following surgery on the circulatory system: Secondary | ICD-10-CM | POA: Diagnosis not present

## 2016-09-26 DIAGNOSIS — I5189 Other ill-defined heart diseases: Secondary | ICD-10-CM

## 2016-09-26 NOTE — Telephone Encounter (Signed)
-----   Message from Lelon Perla, MD sent at 09/24/2016  3:33 PM EST ----- SBP >95 ----- Message ----- From: Rowe Pavy, RN Sent: 09/24/2016   2:15 PM To: Lelon Perla, MD  I will let her know.  What is an acceptable bp for this pt?  We have been given her gatorade and water to increase her bp to at least low 100's.  Thanks for the advisement ----- Message ----- From: Lelon Perla, MD Sent: 09/24/2016  12:39 PM To: Rowe Pavy, RN  Would continue metoprolol at present dose Kirk Ruths  ----- Message ----- From: Rowe Pavy, RN Sent: 09/24/2016  11:35 AM To: Lelon Perla, MD  Dr. Stanford Breed The above pt is exercising in cardiac rehab.  Pt with mid 90 to [id number] for systolic bp.  Pt is worried this is too low and at times she feels spacey.  She understands the benefit of taking a beta blocker and wonders if she should quarter the 12.5mg  twice a day?  Your thoughts Psychologist, clinical

## 2016-09-29 ENCOUNTER — Encounter (HOSPITAL_COMMUNITY)
Admission: RE | Admit: 2016-09-29 | Discharge: 2016-09-29 | Disposition: A | Discharge status: Home / Self Care | Payer: 59 | Source: Ambulatory Visit | Attending: Cardiology | Admitting: Cardiology

## 2016-09-29 DIAGNOSIS — Z48812 Encounter for surgical aftercare following surgery on the circulatory system: Secondary | ICD-10-CM | POA: Diagnosis not present

## 2016-09-29 DIAGNOSIS — I5189 Other ill-defined heart diseases: Secondary | ICD-10-CM

## 2016-09-30 ENCOUNTER — Telehealth: Payer: Self-pay | Admitting: Cardiology

## 2016-09-30 NOTE — Telephone Encounter (Signed)
Would wait until March 2018 (six months after recent cardiac mass resection and PFO closure). Emily Joseph

## 2016-09-30 NOTE — Telephone Encounter (Signed)
Will forward for dr crenshaw review  

## 2016-09-30 NOTE — Telephone Encounter (Signed)
Spoke with pt, Aware of dr crenshaw's recommendations.  °

## 2016-09-30 NOTE — Telephone Encounter (Signed)
Pt says her dentist wants to know if it is alright for her to have her teeth cleaned? Her dentist is Dr Dorann Lodge and her phone number is [phone].

## 2016-10-01 ENCOUNTER — Encounter (HOSPITAL_COMMUNITY): Payer: 59

## 2016-10-06 ENCOUNTER — Encounter (HOSPITAL_COMMUNITY)
Admission: RE | Admit: 2016-10-06 | Discharge: 2016-10-06 | Disposition: A | Discharge status: Home / Self Care | Payer: 59 | Source: Ambulatory Visit | Attending: Cardiology | Admitting: Cardiology

## 2016-10-06 DIAGNOSIS — Z48812 Encounter for surgical aftercare following surgery on the circulatory system: Secondary | ICD-10-CM | POA: Diagnosis not present

## 2016-10-06 DIAGNOSIS — I5189 Other ill-defined heart diseases: Secondary | ICD-10-CM

## 2016-10-06 LAB — GLUCOSE, CAPILLARY: Glucose-Capillary: 95 mg/dL (ref 65–99)

## 2016-10-08 ENCOUNTER — Encounter (HOSPITAL_COMMUNITY): Payer: 59

## 2016-10-08 ENCOUNTER — Telehealth (HOSPITAL_COMMUNITY): Payer: Self-pay | Admitting: Family Medicine

## 2016-10-10 ENCOUNTER — Encounter (HOSPITAL_COMMUNITY)
Admission: RE | Admit: 2016-10-10 | Discharge: 2016-10-10 | Disposition: A | Discharge status: Home / Self Care | Payer: 59 | Source: Ambulatory Visit | Attending: Cardiology | Admitting: Cardiology

## 2016-10-10 DIAGNOSIS — Z48812 Encounter for surgical aftercare following surgery on the circulatory system: Secondary | ICD-10-CM | POA: Insufficient documentation

## 2016-10-10 DIAGNOSIS — Z9889 Other specified postprocedural states: Secondary | ICD-10-CM | POA: Diagnosis not present

## 2016-10-10 DIAGNOSIS — Z8679 Personal history of other diseases of the circulatory system: Secondary | ICD-10-CM

## 2016-10-10 DIAGNOSIS — I519 Heart disease, unspecified: Secondary | ICD-10-CM | POA: Diagnosis present

## 2016-10-10 DIAGNOSIS — I5189 Other ill-defined heart diseases: Secondary | ICD-10-CM

## 2016-10-10 HISTORY — DX: Personal history of other diseases of the circulatory system: Z86.79

## 2016-10-13 ENCOUNTER — Encounter (HOSPITAL_COMMUNITY)
Admission: RE | Admit: 2016-10-13 | Discharge: 2016-10-13 | Disposition: A | Discharge status: Home / Self Care | Payer: 59 | Source: Ambulatory Visit | Attending: Cardiology | Admitting: Cardiology

## 2016-10-13 DIAGNOSIS — Z48812 Encounter for surgical aftercare following surgery on the circulatory system: Secondary | ICD-10-CM | POA: Diagnosis not present

## 2016-10-13 DIAGNOSIS — I5189 Other ill-defined heart diseases: Secondary | ICD-10-CM

## 2016-10-15 ENCOUNTER — Encounter (HOSPITAL_COMMUNITY)
Admission: RE | Admit: 2016-10-15 | Discharge: 2016-10-15 | Disposition: A | Discharge status: Home / Self Care | Payer: 59 | Source: Ambulatory Visit | Attending: Cardiology | Admitting: Cardiology

## 2016-10-15 DIAGNOSIS — Z48812 Encounter for surgical aftercare following surgery on the circulatory system: Secondary | ICD-10-CM | POA: Diagnosis not present

## 2016-10-15 DIAGNOSIS — I5189 Other ill-defined heart diseases: Secondary | ICD-10-CM

## 2016-10-16 ENCOUNTER — Telehealth: Payer: Self-pay | Admitting: *Deleted

## 2016-10-16 NOTE — Progress Notes (Signed)
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  fluticasone (FLONASE) 50 MCG/ACT nasal spray, Place 2 sprays into both nostrils daily., Disp: 16 g, Rfl: 6  Past Medical History: Past Medical History:  Diagnosis Date  . Adenomyosis    not papanicolaou smear of cervix and cervical HPV  . Anxiety 06/08/2016   -about her health and about taking medications  . Arthritis    ?  Marland Kitchen Asthma   . 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 Chronic fatigue 06/08/2016   -eval with rheum x2, neurology, gastroenterology  . Chronic pain 06/08/2016   -all over her whole life; joints, muscles, head -numerous evaluations, Dr. Trudie Reed in 2017, [place] [facility] -seeing Dr. Jaynee Eagles, Neurologist for back pain with radicular symptoms  . Complication of anesthesia    "with epidural bp bottoms out"  . Dysrhythmia    fast hr  . Eosinophilic esophagitis    Sees Dr. Carlean Purl  . Fibromyalgia   . GERD (gastroesophageal reflux disease)   . Heart murmur   . History of pneumonia    when pt was pregnant  . Hypertension   . Iron deficiency anemia due to chronic blood loss    Menorrhagia, s/p complete hysterectomy, ovaries remain hx  . Irritable bowel syndrome 01/27/2013   Dr Carlean Purl 02/2016   . Microhematuria   . Mouth problem    failed gum graft 1/16  . Nasal airway abnormality   . Nasal  obstruction 06/26/2015   Seen by ENT 06-26-15.  May need sleep study to see if having apnea    . Palpitations   . 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 Shortness of breath dyspnea   . SUI (stress urinary incontinence, female)   . Syncope    with last child with epideral  . UTI (urinary tract infection) 07/14/2016   >=100,000 COLONIES/mL KLEBSIELLA PNEUMONIA    Tobacco Use: History  Smoking Status  . Never Smoker  Smokeless Tobacco  . Never Used    Labs: Recent Review Flowsheet Data    Labs for ITP Cardiac and Pulmonary Rehab Latest Ref Rng & Units 07/11/2016 07/11/2016 07/11/2016 07/11/2016 07/12/2016   Hemoglobin A1c 4.8 - 5.6 % - - - - -   PHART 7.350 - 7.450 7.320(L) 7.362 7.340(L) - -   PCO2ART 32.0 - 48.0 mmHg 47.3 37.0 40.6 - -   HCO3 20.0 - 28.0 mmol/L 24.7 21.2 22.0 - -   TCO2 0 - 100 mmol/L 26 22 23 23 25    ACIDBASEDEF 0.0 - 2.0 mmol/L 2.0 4.0(H) 4.0(H) - -   O2SAT % 100.0 100.0 99.0 - -      Capillary Blood Glucose: Lab Results  Component Value Date   GLUCAP 95 10/06/2016   GLUCAP 128 (H) 07/13/2016  GLUCAP 114 (H) 07/13/2016   GLUCAP 125 (H) 07/13/2016   GLUCAP 134 (H) 07/12/2016     Exercise Target Goals:    Exercise Program Goal: Individual exercise prescription set with THRR, safety & activity barriers. Participant demonstrates ability to understand and report RPE using BORG scale, to self-measure pulse accurately, and to acknowledge the importance of the exercise prescription.  Exercise Prescription Goal: Starting with aerobic activity 30 plus minutes a day, 3 days per week for initial exercise prescription. Provide home exercise prescription and guidelines that participant acknowledges understanding prior to discharge.  Activity Barriers & Risk Stratification:     Activity Barriers & Cardiac Risk Stratification - 09/11/16 0905      Activity Barriers & Cardiac Risk Stratification    Activity Barriers Other (comment)   Comments wears orthotic in left shoe, right hip pain, right should and neck pain    Cardiac Risk Stratification Low      6 Minute Walk:     6 Minute Walk    Row Name 09/11/16 1346         6 Minute Walk   Phase Initial     Distance 1400 feet     Walk Time 6 minutes     # of Rest Breaks 0     MPH 2.65     METS 4.75     RPE 12     VO2 Peak 16.64     Symptoms No     Resting HR 98 bpm     Resting BP 104/70     Max Ex. HR 112 bpm     Max Ex. BP 102/70     2 Minute Post BP 103/71        Initial Exercise Prescription:     Initial Exercise Prescription - 09/11/16 1400      Date of Initial Exercise RX and Referring Provider   Date 09/11/16   Referring Provider Kirk Ruths MD     Treadmill   MPH 2.6   Grade 0   Minutes 10   METs 2.99     Bike   Level 0.5   Minutes 10   METs 2.57     NuStep   Level 3   Minutes 10   METs 2     Prescription Details   Frequency (times per week) 3   Duration Progress to 30 minutes of continuous aerobic without signs/symptoms of physical distress     Intensity   THRR 40-80% of Max Heartrate 70-140   Ratings of Perceived Exertion 11-13     Progression   Progression Continue to progress workloads to maintain intensity without signs/symptoms of physical distress.     Resistance Training   Training Prescription Yes   Weight 2lbs   Reps 10-12      Perform Capillary Blood Glucose checks as needed.  Exercise Prescription Changes:     Exercise Prescription Changes    Row Name 09/16/16 1000 09/17/16 1600 10/14/16 1100         Exercise Review   Progression Yes Yes Yes       Response to Exercise   Blood Pressure (Admit) 100/68 106/62 106/73     Blood Pressure (Exercise) 104/62 110/72 118/70     Blood Pressure (Exit) 98/60 101/69 103/62     Heart Rate (Admit) 89 bpm 82 bpm 89 bpm     Heart Rate (Exercise) 114 bpm 106 bpm 114 bpm     Heart Rate (Exit) 86 bpm 81 bpm  81 bpm      Rating of Perceived Exertion (Exercise) 14 13 12      Symptoms none none none     Comments  -  - Reviewed HEP on  09/24/16     Duration Progress to 30 minutes of continuous aerobic without signs/symptoms of physical distress Progress to 30 minutes of continuous aerobic without signs/symptoms of physical distress Progress to 30 minutes of continuous aerobic without signs/symptoms of physical distress     Intensity THRR unchanged THRR unchanged THRR unchanged       Progression   Average METs 2.4 2.5 2.6       Resistance Training   Training Prescription Yes Yes Yes     Weight 2lbs 2lbs 2lbs     Reps 10-12 10-12 10-12       Treadmill   MPH (P)  2  decreased speed per pt. tolerance/comfort level 2.3  decreased speed per pt. tolerance/comfort level 2.5  decreased speed per pt. tolerance/comfort level     Grade 0 1 1     Minutes 10 10 10      METs 2.99 3.08 3.26       Bike   Level 0.5 0.5 0.5     Minutes 10 10 10      METs 2.55 2.55 2.52       NuStep   Level 3 3 3      Minutes 10 10 10      METs 1.7 1.9 2       Home Exercise Plan   Plans to continue exercise at  -  - Home  Reviewed on 09/24/16 see progress note     Frequency  -  - Add 2 additional days to program exercise sessions.        Exercise Comments:     Exercise Comments    Row Name 09/16/16 1045 10/14/16 1142         Exercise Comments Reduced TM speed per pt comfort level. Will continue to monitor exercise progression/tolerance Reviewed METs and goals. Pt is tolerating exercise well; will continue to monitor exercise progression.         Discharge Exercise Prescription (Final Exercise Prescription Changes):     Exercise Prescription Changes - 10/14/16 1100      Exercise Review   Progression Yes     Response to Exercise   Blood Pressure (Admit) 106/73   Blood Pressure (Exercise) 118/70   Blood Pressure (Exit) 103/62   Heart Rate (Admit) 89 bpm   Heart Rate (Exercise) 114 bpm   Heart Rate (Exit) 81 bpm    Rating of Perceived Exertion (Exercise) 12   Symptoms none   Comments Reviewed HEP on  09/24/16   Duration Progress to 30 minutes of continuous aerobic without signs/symptoms of physical distress   Intensity THRR unchanged     Progression   Average METs 2.6     Resistance Training   Training Prescription Yes   Weight 2lbs   Reps 10-12     Treadmill   MPH 2.5  decreased speed per pt. tolerance/comfort level   Grade 1   Minutes 10   METs 3.26     Bike   Level 0.5   Minutes 10   METs 2.52     NuStep   Level 3   Minutes 10   METs 2     Home Exercise Plan   Plans to continue exercise at Home  Reviewed on 09/24/16 see progress note   Frequency Add 2  additional days to program exercise sessions.      Nutrition:  Target Goals: Understanding of nutrition guidelines, daily intake of sodium 1500mg , cholesterol 200mg , calories 30% from fat and 7% or less from saturated fats, daily to have 5 or more servings of fruits and vegetables.  Biometrics:     Pre Biometrics - 09/11/16 1403      Pre Biometrics   Waist Circumference 30.75 inches   Hip Circumference 38.5 inches   Waist to Hip Ratio 0.8 %   Triceps Skinfold 34 mm   % Body Fat 32.8 %   Grip Strength 30 kg   Flexibility 13 in   Single Leg Stand 30 seconds       Nutrition Therapy Plan and Nutrition Goals:   Nutrition Discharge: Nutrition Scores:   Nutrition Goals Re-Evaluation:   Psychosocial: Target Goals: Acknowledge presence or absence of depression, maximize coping skills, provide positive support system. Participant is able to verbalize types and ability to use techniques and skills needed for reducing stress and depression.  Initial Review & Psychosocial Screening:     Initial Psych Review & Screening - 09/11/16 [place]? Yes   Comments Brief psychosocial assessment reveals no needs, no further intervention at this time.     Screening Interventions    Interventions Encouraged to exercise      Quality of Life Scores:     Quality of Life - 09/11/16 1405      Quality of Life Scores   Health/Function Pre 11.89 %   Socioeconomic Pre 22.31 %   Psych/Spiritual Pre 23.57 %   Family Pre 27.3 %   GLOBAL Pre 19.23 %      PHQ-9: Recent Review Flowsheet Data    Depression screen [facility] 2/9 09/15/2016 03/26/2016 03/12/2016 10/09/2015 10/03/2015   Decreased Interest 0 0 0 0 0   Down, Depressed, Hopeless 0 0 0 0 0   PHQ - 2 Score 0 0 0 0 0      Psychosocial Evaluation and Intervention:     Psychosocial Evaluation - 10/16/16 1551      Psychosocial Evaluation & Interventions   Interventions Stress management education;Encouraged to exercise with the program and follow exercise prescription;Relaxation education   Comments continue to monitor.  Provide support and encouragement.     Discharge Psychosocial Assessment & Intervention   Discharge Continue support measures as needed      Psychosocial Re-Evaluation:     Psychosocial Re-Evaluation    Row Name 09/18/16 1409 10/16/16 1551           Psychosocial Re-Evaluation   Interventions Stress management education;Relaxation education;Encouraged to attend Cardiac Rehabilitation for the exercise Encouraged to attend Cardiac Rehabilitation for the exercise;Stress management education;Relaxation education      Continued Psychosocial Services Needed  - No         Vocational Rehabilitation: Provide vocational rehab assistance to qualifying candidates.   Vocational Rehab Evaluation & Intervention:     Vocational Rehab - 09/11/16 1833      Initial Vocational Rehab Evaluation & Intervention   Assessment shows need for Vocational Rehabilitation No  Pt does not plan to return back to work.      Education: Education Goals: Education classes will be provided on a weekly basis, covering required topics. Participant will state understanding/return demonstration of topics  presented.  Learning Barriers/Preferences:     Learning Barriers/Preferences - 09/10/16 1019      Learning Barriers/Preferences  Learning Barriers None   Learning Preferences None      Education Topics: Count Your Pulse:  -Group instruction provided by verbal instruction, demonstration, patient participation and written materials to support subject.  Instructors address importance of being able to find your pulse and how to count your pulse when at home without a heart monitor.  Patients get hands on experience counting their pulse with staff help and individually.   Heart Attack, Angina, and Risk Factor Modification:  -Group instruction provided by verbal instruction, video, and written materials to support subject.  Instructors address signs and symptoms of angina and heart attacks.    Also discuss risk factors for heart disease and how to make changes to improve heart health risk factors. Flowsheet Row CARDIAC REHAB PHASE II EXERCISE from 10/15/2016 in [place]  Date  10/10/16  Educator  Andi Hence, RN  Instruction Review Code  2- meets goals/outcomes      Functional Fitness:  -Group instruction provided by verbal instruction, demonstration, patient participation, and written materials to support subject.  Instructors address safety measures for doing things around the house.  Discuss how to get up and down off the floor, how to pick things up properly, how to safely get out of a chair without assistance, and balance training. Flowsheet Row CARDIAC REHAB PHASE II EXERCISE from 10/15/2016 in [place]  Date  09/19/16  Instruction Review Code  2- meets goals/outcomes      Meditation and Mindfulness:  -Group instruction provided by verbal instruction, patient participation, and written materials to support subject.  Instructor addresses importance of mindfulness and meditation practice to help reduce stress and  improve awareness.  Instructor also leads participants through a meditation exercise.    Stretching for Flexibility and Mobility:  -Group instruction provided by verbal instruction, patient participation, and written materials to support subject.  Instructors lead participants through series of stretches that are designed to increase flexibility thus improving mobility.  These stretches are additional exercise for major muscle groups that are typically performed during regular warm up and cool down. Flowsheet Row CARDIAC REHAB PHASE II EXERCISE from 10/15/2016 in [place]  Date  10/15/16  Instruction Review Code  2- meets goals/outcomes      Hands Only CPR Anytime:  -Group instruction provided by verbal instruction, video, patient participation and written materials to support subject.  Instructors co-teach with AHA video for hands only CPR.  Participants get hands on experience with mannequins.   Nutrition I class: Heart Healthy Eating:  -Group instruction provided by PowerPoint slides, verbal discussion, and written materials to support subject matter. The instructor gives an explanation and review of the Therapeutic Lifestyle Changes diet recommendations, which includes a discussion on lipid goals, dietary fat, sodium, fiber, plant stanol/sterol esters, sugar, and the components of a well-balanced, healthy diet.   Nutrition II class: Lifestyle Skills:  -Group instruction provided by PowerPoint slides, verbal discussion, and written materials to support subject matter. The instructor gives an explanation and review of label reading, grocery shopping for heart health, heart healthy recipe modifications, and ways to make healthier choices when eating out.   Diabetes Question & Answer:  -Group instruction provided by PowerPoint slides, verbal discussion, and written materials to support subject matter. The instructor gives an explanation and review of diabetes  co-morbidities, pre- and post-prandial blood glucose goals, pre-exercise blood glucose goals, signs, symptoms, and treatment of hypoglycemia and hyperglycemia, and foot care  basics.   Diabetes Blitz:  -Group instruction provided by PowerPoint slides, verbal discussion, and written materials to support subject matter. The instructor gives an explanation and review of the physiology behind type 1 and type 2 diabetes, diabetes medications and rational behind using different medications, pre- and post-prandial blood glucose recommendations and Hemoglobin A1c goals, diabetes diet, and exercise including blood glucose guidelines for exercising safely.    Portion Distortion:  -Group instruction provided by PowerPoint slides, verbal discussion, written materials, and food models to support subject matter. The instructor gives an explanation of serving size versus portion size, changes in portions sizes over the last 20 years, and what consists of a serving from each food group.   Stress Management:  -Group instruction provided by verbal instruction, video, and written materials to support subject matter.  Instructors review role of stress in heart disease and how to cope with stress positively.   Flowsheet Row CARDIAC REHAB PHASE II EXERCISE from 10/15/2016 in [place]  Date  09/24/16  Instruction Review Code  2- meets goals/outcomes      Exercising on Your Own:  -Group instruction provided by verbal instruction, power point, and written materials to support subject.  Instructors discuss benefits of exercise, components of exercise, frequency and intensity of exercise, and end points for exercise.  Also discuss use of nitroglycerin and activating EMS.  Review options of places to exercise outside of rehab.  Review guidelines for sex with heart disease.   Cardiac Drugs I:  -Group instruction provided by verbal instruction and written materials to support subject.   Instructor reviews cardiac drug classes: antiplatelets, anticoagulants, beta blockers, and statins.  Instructor discusses reasons, side effects, and lifestyle considerations for each drug class.   Cardiac Drugs II:  -Group instruction provided by verbal instruction and written materials to support subject.  Instructor reviews cardiac drug classes: angiotensin converting enzyme inhibitors (ACE-I), angiotensin II receptor blockers (ARBs), nitrates, and calcium channel blockers.  Instructor discusses reasons, side effects, and lifestyle considerations for each drug class. Flowsheet Row CARDIAC REHAB PHASE II EXERCISE from 10/15/2016 in [place]  Date  09/26/16  Instruction Review Code  2- meets goals/outcomes      Anatomy and Physiology of the Circulatory System:  -Group instruction provided by verbal instruction, video, and written materials to support subject.  Reviews functional anatomy of heart, how it relates to various diagnoses, and what role the heart plays in the overall system.   Knowledge Questionnaire Score:     Knowledge Questionnaire Score - 09/11/16 1346      Knowledge Questionnaire Score   Pre Score 24/24      Core Components/Risk Factors/Patient Goals at Admission:     Personal Goals and Risk Factors at Admission - 09/11/16 0917      Core Components/Risk Factors/Patient Goals on Admission   Increase Strength and Stamina Yes   Intervention Provide advice, education, support and counseling about physical activity/exercise needs.;Develop an individualized exercise prescription for aerobic and resistive training based on initial evaluation findings, risk stratification, comorbidities and participant's personal goals.   Expected Outcomes Achievement of increased cardiorespiratory fitness and enhanced flexibility, muscular endurance and strength shown through measurements of functional capacity and personal statement of participant.    Hypertension Yes   Intervention Provide education on lifestyle modifcations including regular physical activity/exercise, weight management, moderate sodium restriction and increased consumption of fresh fruit, vegetables, and low fat dairy, alcohol moderation, and smoking cessation.;Monitor prescription use compliance.  Expected Outcomes Short Term: Continued assessment and intervention until BP is < 140/54mm HG in hypertensive participants. < 130/32mm HG in hypertensive participants with diabetes, heart failure or chronic kidney disease.;Long Term: Maintenance of blood pressure at goal levels.   Stress Yes   Intervention Offer individual and/or small group education and counseling on adjustment to heart disease, stress management and health-related lifestyle change. Teach and support self-help strategies.;Refer participants experiencing significant psychosocial distress to appropriate mental health specialists for further evaluation and treatment. When possible, include family members and significant others in education/counseling sessions.   Expected Outcomes Short Term: Participant demonstrates changes in health-related behavior, relaxation and other stress management skills, ability to obtain effective social support, and compliance with psychotropic medications if prescribed.;Long Term: Emotional wellbeing is indicated by absence of clinically significant psychosocial distress or social isolation.   Personal Goal Other Yes   Personal Goal increase stamina, be able to do more with family, decrease anxiety   Intervention individualized exercise prescription, education classes, relaxation and meditation classes   Expected Outcomes increase overall functional capacity and ability to be active with family      Core Components/Risk Factors/Patient Goals Review:      Goals and Risk Factor Review    Row Name 10/14/16 0812             Core Components/Risk Factors/Patient Goals Review   Personal  Goals Review Increase Strength and Stamina       Review Pt is exercising at home. Pt walks on treadmill 2-3x week for 13 minutes, Discussed time progression and importance of warm up and cool down.       Expected Outcomes Pt will continue to show progression time/speed on treadmill. Pt will continue to show progression in exercise tolerance.          Core Components/Risk Factors/Patient Goals at Discharge (Final Review):      Goals and Risk Factor Review - 10/14/16 0812      Core Components/Risk Factors/Patient Goals Review   Personal Goals Review Increase Strength and Stamina   Review Pt is exercising at home. Pt walks on treadmill 2-3x week for 13 minutes, Discussed time progression and importance of warm up and cool down.   Expected Outcomes Pt will continue to show progression time/speed on treadmill. Pt will continue to show progression in exercise tolerance.      ITP Comments:     ITP Comments    Row Name 09/10/16 1023 09/11/16 0920         ITP Comments Dr.Traci Radford Pax, Medical Director  Dr.Traci Turner, Medical Director          Comments:  Pt is making expected progress toward personal goals after completing 11 sessions.Repeat Psychosocial Assessment: Pt with supportive family, denies any Psychosocial needs or interventions at this time. Pt with improving outlook and noted less anxiety. Pt observed interacting positively with other participants. Will monitor pt response to d/c metoprolol due to feeling dizzy and spacey. Recommend continued exercise and life style modification education including  stress management and relaxation techniques to decrease cardiac risk profile. Cherre Huger, BSN

## 2016-10-16 NOTE — Telephone Encounter (Signed)
Spoke with pt, Aware of dr Jacalyn Lefevre recommendations. Patient voiced understanding to stop metoprolol

## 2016-10-16 NOTE — Telephone Encounter (Signed)
-----   Message from Lelon Perla, MD sent at 10/16/2016  3:26 PM EST ----- Regarding: FW: update on pt bp in cardiac rehab DC metoprolol Kirk Ruths  ----- Message ----- From: Rowe Pavy, RN Sent: 10/16/2016   3:15 PM To: Lelon Perla, MD Subject: update on pt bp in cardiac rehab               Dr. Stanford Breed  Thought I would touch base with you to let you know how Emily Joseph is doing overall in cardiac rehab.  BP remain soft despite wearing a girdle and   having gatorade each time she exercises to maintain bp above the recommended bp of 95/.  As you are probably aware pt has increased anxiety regarding her bp and reports having low energy and feeling dizzy at home. Pt would like for you to reconsider the beta blocker dosage or switching to another one.  I have explained to her the benefits of beta blocker post cardiac event but pt says as soon as she takes the metoprolol she starts to feel funny. Please advise on how best to proceed. Maurice Small RN

## 2016-10-17 ENCOUNTER — Encounter (HOSPITAL_COMMUNITY)
Admission: RE | Admit: 2016-10-17 | Discharge: 2016-10-17 | Disposition: A | Discharge status: Home / Self Care | Payer: 59 | Source: Ambulatory Visit | Attending: Cardiology | Admitting: Cardiology

## 2016-10-17 DIAGNOSIS — I5189 Other ill-defined heart diseases: Secondary | ICD-10-CM

## 2016-10-17 DIAGNOSIS — Z48812 Encounter for surgical aftercare following surgery on the circulatory system: Secondary | ICD-10-CM | POA: Diagnosis not present

## 2016-10-20 ENCOUNTER — Encounter (HOSPITAL_COMMUNITY)
Admission: RE | Admit: 2016-10-20 | Discharge: 2016-10-20 | Disposition: A | Discharge status: Home / Self Care | Payer: 59 | Source: Ambulatory Visit | Attending: Cardiology | Admitting: Cardiology

## 2016-10-20 DIAGNOSIS — I5189 Other ill-defined heart diseases: Secondary | ICD-10-CM

## 2016-10-20 DIAGNOSIS — Z48812 Encounter for surgical aftercare following surgery on the circulatory system: Secondary | ICD-10-CM | POA: Diagnosis not present

## 2016-10-22 ENCOUNTER — Telehealth: Payer: Self-pay | Admitting: Cardiology

## 2016-10-22 ENCOUNTER — Encounter (HOSPITAL_COMMUNITY)
Admission: RE | Admit: 2016-10-22 | Discharge: 2016-10-22 | Disposition: A | Discharge status: Home / Self Care | Payer: 59 | Source: Ambulatory Visit | Attending: Cardiology | Admitting: Cardiology

## 2016-10-22 DIAGNOSIS — I5189 Other ill-defined heart diseases: Secondary | ICD-10-CM

## 2016-10-22 DIAGNOSIS — Z48812 Encounter for surgical aftercare following surgery on the circulatory system: Secondary | ICD-10-CM | POA: Diagnosis not present

## 2016-10-22 NOTE — Telephone Encounter (Signed)
New message  Pt call requesting to speak with RN about her low heart rate. Pt states her systolic has been low as well. Please call back to discuss

## 2016-10-22 NOTE — Telephone Encounter (Signed)
Spoke with pt, Aware of dr crenshaw's recommendations.  °

## 2016-10-22 NOTE — Progress Notes (Signed)
Emily Joseph 46 y.o. female Nutrition Note Spoke with pt.  Nutrition Survey reviewed with pt. Pt is following Step 2 of the Therapeutic Lifestyle Changes diet. Pt expressed understanding of the information reviewed.  Lab Results  Component Value Date   HGBA1C 5.1 07/09/2016   Wt Readings from Last 3 Encounters:  09/16/16 134 lb (60.8 kg)  09/15/16 134 lb (60.8 kg)  09/11/16 134 lb 4.2 oz (60.9 kg)   Nutrition Diagnosis ? Food-and nutrition-related knowledge deficit related to lack of exposure to information as related to diagnosis of: ? CVD Nutrition Intervention ? Benefits of adopting Therapeutic Lifestyle Changes discussed when Medficts reviewed. ? Pt to attend the Portion Distortion class ? Pt to attend the  ? Nutrition I class                      ? Nutrition II class - met; 10/21/16 ? Continue client-centered nutrition education by RD, as part of interdisciplinary care.  Goal(s) ? Pt to describe the benefit of including fruits, vegetables, whole grains, and low-fat dairy products in a heart healthy meal plan.  Monitor and Evaluate progress toward nutrition goal with team.  Derek Mound, M.Ed, RD, LDN, CDE 10/22/2016 12:25 PM

## 2016-10-22 NOTE — Telephone Encounter (Signed)
Called pt, goes to VM. OK to leave detailed msg per DPR. Did advise that based on information provided, Dr. Stanford Breed did not express concerns. Recommended she f/u in cardiac rehab as scheduled and if new issues or questions, to call back.

## 2016-10-22 NOTE — Telephone Encounter (Signed)
Pt of Dr. Stanford Breed Hx: right atrial mass resection PFO repair Currently enrolled in cardiac rehab  Spoke to patient. Note that it was very difficult to get a clear chronology of her concerns as she jumps from subject to subject.  As best as I was able to understand, patient notes concerns regarding HR readings by her fitbit.  Shares that she has an average reading of 71 to 75 - concerned because the average seems to have gone up since discontinuing the metoprolol, but also notes that she has high HR peaks that seem to occur sometimes w stress, anxiety, exercise. She shared HR peak reading of 186 on Dec 5th (before stopping metoprolol) and 154 on Dec 10th.  She participates in cardiac rehab, Metoprolol discontinued recently due to her low BPs. Overall, she notes the BPs seem to be doing better. She was concerns about d/cing the metoprolol from 12.5mg  BID rather than cutting further to quarter tab BID first. Notes her sensitivity to meds and med changes.  Notes concern for orthostatic hypotension.  Of note, she's not having any symptoms today but did express concern that her average HR was higher now. I advised that this would probably be the case going off the BB.  Informed her I'm not sure how to assist but would need to let provider review available info; an OV may be advised.

## 2016-10-22 NOTE — Telephone Encounter (Signed)
HR normal Emily Joseph

## 2016-10-24 ENCOUNTER — Encounter (HOSPITAL_COMMUNITY): Payer: 59

## 2016-10-27 ENCOUNTER — Other Ambulatory Visit: Payer: Self-pay

## 2016-10-27 ENCOUNTER — Encounter: Payer: 59 | Admitting: Physician Assistant

## 2016-10-27 ENCOUNTER — Other Ambulatory Visit (HOSPITAL_COMMUNITY): Payer: Self-pay

## 2016-10-27 ENCOUNTER — Encounter (HOSPITAL_COMMUNITY)
Admission: RE | Admit: 2016-10-27 | Discharge: 2016-10-27 | Disposition: A | Discharge status: Home / Self Care | Payer: 59 | Source: Ambulatory Visit | Attending: Cardiology | Admitting: Cardiology

## 2016-10-27 ENCOUNTER — Telehealth: Payer: Self-pay | Admitting: Cardiology

## 2016-10-27 ENCOUNTER — Telehealth: Payer: Self-pay | Admitting: Physician Assistant

## 2016-10-27 ENCOUNTER — Encounter (HOSPITAL_COMMUNITY): Payer: Self-pay | Admitting: Nurse Practitioner

## 2016-10-27 ENCOUNTER — Observation Stay (HOSPITAL_COMMUNITY)
Admission: EM | Admit: 2016-10-27 | Discharge: 2016-10-28 | Disposition: A | Discharge status: Home / Self Care | Payer: 59 | Attending: Cardiology | Admitting: Cardiology

## 2016-10-27 ENCOUNTER — Emergency Department (HOSPITAL_COMMUNITY): Payer: 59

## 2016-10-27 DIAGNOSIS — I5189 Other ill-defined heart diseases: Secondary | ICD-10-CM

## 2016-10-27 DIAGNOSIS — F419 Anxiety disorder, unspecified: Secondary | ICD-10-CM | POA: Diagnosis not present

## 2016-10-27 DIAGNOSIS — E876 Hypokalemia: Secondary | ICD-10-CM | POA: Insufficient documentation

## 2016-10-27 DIAGNOSIS — G8929 Other chronic pain: Secondary | ICD-10-CM | POA: Insufficient documentation

## 2016-10-27 DIAGNOSIS — Z8249 Family history of ischemic heart disease and other diseases of the circulatory system: Secondary | ICD-10-CM | POA: Diagnosis not present

## 2016-10-27 DIAGNOSIS — I110 Hypertensive heart disease with heart failure: Secondary | ICD-10-CM | POA: Diagnosis not present

## 2016-10-27 DIAGNOSIS — I509 Heart failure, unspecified: Secondary | ICD-10-CM | POA: Insufficient documentation

## 2016-10-27 DIAGNOSIS — M797 Fibromyalgia: Secondary | ICD-10-CM | POA: Diagnosis not present

## 2016-10-27 DIAGNOSIS — Z48812 Encounter for surgical aftercare following surgery on the circulatory system: Secondary | ICD-10-CM | POA: Diagnosis not present

## 2016-10-27 DIAGNOSIS — Z8679 Personal history of other diseases of the circulatory system: Secondary | ICD-10-CM | POA: Diagnosis present

## 2016-10-27 DIAGNOSIS — I4892 Unspecified atrial flutter: Secondary | ICD-10-CM | POA: Diagnosis not present

## 2016-10-27 LAB — CBC
HCT: 40.9 % (ref 36.0–46.0)
Hemoglobin: 14.1 g/dL (ref 12.0–15.0)
MCH: 29.3 pg (ref 26.0–34.0)
MCHC: 34.5 g/dL (ref 30.0–36.0)
MCV: 84.9 fL (ref 78.0–100.0)
PLATELETS: 248 10*3/uL (ref 150–400)
RBC: 4.82 MIL/uL (ref 3.87–5.11)
RDW: 13 % (ref 11.5–15.5)
WBC: 7.9 10*3/uL (ref 4.0–10.5)

## 2016-10-27 LAB — BASIC METABOLIC PANEL
Anion gap: 8 (ref 5–15)
BUN: 10 mg/dL (ref 6–20)
CALCIUM: 9.5 mg/dL (ref 8.9–10.3)
CO2: 23 mmol/L (ref 22–32)
CREATININE: 0.66 mg/dL (ref 0.44–1.00)
Chloride: 109 mmol/L (ref 101–111)
GFR calc Af Amer: >60 mL/min (ref >60)
Glucose, Bld: 121 mg/dL — ABNORMAL HIGH (ref 65–99)
Potassium: 3.4 mmol/L — ABNORMAL LOW (ref 3.5–5.1)
SODIUM: 140 mmol/L (ref 135–145)

## 2016-10-27 LAB — TSH: TSH: 2.131 u[IU]/mL (ref 0.350–4.500)

## 2016-10-27 LAB — URINALYSIS, ROUTINE W REFLEX MICROSCOPIC
Bilirubin Urine: NEGATIVE
GLUCOSE, UA: NEGATIVE mg/dL
Ketones, ur: NEGATIVE mg/dL
Leukocytes, UA: NEGATIVE
Nitrite: NEGATIVE
Protein, ur: NEGATIVE mg/dL
SPECIFIC GRAVITY, URINE: 1.002 — AB (ref 1.005–1.030)
pH: 7 (ref 5.0–8.0)

## 2016-10-27 LAB — I-STAT TROPONIN, ED: Troponin i, poc: 0 ng/mL (ref 0.00–0.08)

## 2016-10-27 LAB — PREGNANCY, URINE: PREG TEST UR: NEGATIVE

## 2016-10-27 LAB — MAGNESIUM: Magnesium: 2 mg/dL (ref 1.7–2.4)

## 2016-10-27 MED ORDER — SODIUM CHLORIDE 0.9 % IV BOLUS (SEPSIS)
1000.0000 mL | Freq: Once | INTRAVENOUS | Status: AC
  Administered 2016-10-27: 1000 mL via INTRAVENOUS

## 2016-10-27 MED ORDER — METOPROLOL TARTRATE 25 MG PO TABS
25.0000 mg | ORAL_TABLET | Freq: Two times a day (BID) | ORAL | Status: DC
  Administered 2016-10-27 – 2016-10-28 (×2): 25 mg via ORAL
  Filled 2016-10-27 (×2): qty 1

## 2016-10-27 MED ORDER — ADULT MULTIVITAMIN W/MINERALS CH
1.0000 | ORAL_TABLET | Freq: Every day | ORAL | Status: DC
  Administered 2016-10-27 – 2016-10-28 (×2): 1 via ORAL
  Filled 2016-10-27 (×2): qty 1

## 2016-10-27 MED ORDER — METOPROLOL TARTRATE 25 MG PO TABS: 25.0000 mg | ORAL_TABLET | Freq: Two times a day (BID) | ORAL | Status: DC

## 2016-10-27 MED ORDER — ACETAMINOPHEN 500 MG PO TABS: 500.0000 mg | ORAL_TABLET | Freq: Every day | ORAL | Status: DC | PRN

## 2016-10-27 MED ORDER — FLUTICASONE PROPIONATE 50 MCG/ACT NA SUSP
2.0000 | Freq: Every day | NASAL | Status: DC
  Administered 2016-10-28: 2 via NASAL
  Filled 2016-10-27: qty 16

## 2016-10-27 MED ORDER — ENOXAPARIN SODIUM 40 MG/0.4ML ~~LOC~~ SOLN
40.0000 mg | SUBCUTANEOUS | Status: DC
  Administered 2016-10-27: 40 mg via SUBCUTANEOUS
  Filled 2016-10-27: qty 0.4

## 2016-10-27 MED ORDER — ASPIRIN EC 81 MG PO TBEC
81.0000 mg | DELAYED_RELEASE_TABLET | Freq: Every day | ORAL | Status: DC
  Administered 2016-10-27 – 2016-10-28 (×2): 81 mg via ORAL
  Filled 2016-10-27 (×2): qty 1

## 2016-10-27 MED ORDER — ALBUTEROL SULFATE (2.5 MG/3ML) 0.083% IN NEBU: 2.5000 mg | INHALATION_SOLUTION | Freq: Four times a day (QID) | RESPIRATORY_TRACT | Status: DC | PRN

## 2016-10-27 MED ORDER — ONDANSETRON HCL 4 MG/2ML IJ SOLN: 4.0000 mg | Freq: Four times a day (QID) | INTRAMUSCULAR | Status: DC | PRN

## 2016-10-27 NOTE — ED Notes (Signed)
Cardiology at bedside.

## 2016-10-27 NOTE — Progress Notes (Signed)
This encounter was created in error - please disregard.

## 2016-10-27 NOTE — ED Notes (Signed)
placede patient on the monitor did ekg shown to er doctor

## 2016-10-27 NOTE — Telephone Encounter (Signed)
New message  Pt's husband called to say that pt is being transported to the hospital at this time  Canceled appt w/Meng Isaac Laud

## 2016-10-27 NOTE — ED Notes (Signed)
Attempted report 

## 2016-10-27 NOTE — ED Provider Notes (Signed)
[facility] DEPT Provider Note   CSN: [id number] Arrival date & time: 10/27/16  1439     History   Chief Complaint Chief Complaint  Patient presents with  . Near Syncope    HPI RAYSSA STRAMEL is a 45 y.o. female.  HPI   Patient with hx Right atrial lipoma and PFO resected in 07/2016 p/w intermittent palpitations, associated cough, left sided chest pressure, generalized weakness, lightheadedness, near syncope.  Pt was initially on lopressor but was orthostatic with it, taken off on 10/16/16.  On12/10/17 she had an episode of palpitations - described as feeling violent and having an abnormal rhythm, lasted about 1 minute.  Then for the past 4 days she has had racing palpitations every day, usually for short periods of time.  Today the palpitations lasted about 4 minutes, caused coughing.  She laid down on the bed with her feet up without improvement.  She then went to cardiac rehab, as she was leaving developed palpitations again.  At home she started feeling weak, lightheaded, near syncopal, got hot between her shoulder blades and into the back of her head.  Pt currently has some mild chest pressure that is coming and going, lasts a split second and goes away, mild SOB.  Feels some nasal congestion, feels like she's "fighting something off."  Denies leg swelling.  Her only medications currently are flonase and multivitamin.  Had long car trip to [place] over Thanksgiving but got out of the car every 3 hours.    Past Medical History:  Diagnosis Date  . Adenomyosis    not papanicolaou smear of cervix and cervical HPV  . Anxiety 06/08/2016   -about her health and about taking medications  . Arthritis    ?  Marland Kitchen Asthma   . 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 Chronic fatigue 06/08/2016   -eval with rheum x2, neurology, gastroenterology  . Chronic pain 06/08/2016   -all over her whole life; joints, muscles, head -numerous evaluations, Dr. Trudie Reed in 2017, [facility]  r[facility] -seeing Dr. Jaynee Eagles, Neurologist for back pain with radicular symptoms  . Complication of anesthesia    "with epidural bp bottoms out"  . Dysrhythmia    fast hr  . Eosinophilic esophagitis    Sees Dr. Carlean Purl  . Fibromyalgia   . GERD (gastroesophageal reflux disease)   . Heart murmur   . History of pneumonia    when pt was pregnant  . Hypertension   . Iron deficiency anemia due to chronic blood loss    Menorrhagia, s/p complete hysterectomy, ovaries remain hx  . Irritable bowel syndrome 01/27/2013   Dr Carlean Purl 02/2016   . Microhematuria   . Mouth problem    failed gum graft 1/16  . Nasal airway abnormality   . Nasal obstruction 06/26/2015   Seen by ENT 06-26-15.  May need sleep study to see if having apnea    . Palpitations   . 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 Shortness of breath dyspnea   . SUI (stress urinary incontinence, female)   . Syncope    with last child with epideral  . UTI (urinary tract infection) 07/14/2016   >=100,000 COLONIES/mL KLEBSIELLA PNEUMONIA    Patient Active Problem List   Diagnosis Date Noted  . Atrial flutter with rapid ventricular response ([place]) 10/27/2016  . UTI (urinary tract infection)  07/14/2016  . 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 Chronic pain 06/08/2016  . Chronic fatigue 06/08/2016  . GERD (gastroesophageal reflux disease) with esophagitis 06/08/2016  . Palpitations 06/08/2016  . Anxiety 06/08/2016  . Multiple environmental allergies 03/04/2016  . Multiple food allergies 03/04/2016  . Nasal obstruction 06/26/2015  . Irritable bowel syndrome 01/27/2013  . Fibromyalgia 01/07/2007    Past Surgical History:  Procedure Laterality Date  . APPENDECTOMY    . BALLOON DILATION N/A 02/09/2013   Procedure: BALLOON DILATION;  Surgeon: Arta Silence, MD;  Location: [facility];  Service: Endoscopy;  Laterality: N/A;  . BILATERAL  SALPINGECTOMY N/A 06/14/2014   Procedure: BILATERAL SALPINGECTOMY;  Surgeon: Allena Katz, MD;  Location: [facility];  Service: Gynecology;  Laterality: N/A;  . COLONOSCOPY WITH PROPOFOL N/A 02/09/2013   Procedure: COLONOSCOPY WITH PROPOFOL;  Surgeon: Arta Silence, MD;  [facility]: [facility];  Service: Endoscopy;  Laterality: N/A;  need ultra thin colon scope  . ESOPHAGOGASTRODUODENOSCOPY (EGD) WITH PROPOFOL N/A 02/09/2013   Procedure: ESOPHAGOGASTRODUODENOSCOPY (EGD) WITH PROPOFOL;  Surgeon: Arta Silence, MD;  [facility];  Service: Endoscopy;  Laterality: N/A;  . LAPAROSCOPIC ASSISTED VAGINAL HYSTERECTOMY Right 06/14/2014   Procedure: OPEN LAPAROSCOPIC ASSISTED VAGINAL HYSTERECTOMY;  Surgeon: Allena Katz, MD;  Location: [facility];  Service: Gynecology;  Laterality: Right;  . LYSIS OF ADHESION N/A 06/14/2014   Procedure: LYSIS OF ADHESION;  Surgeon: Allena Katz, MD;  Location: [facility];  Service: Gynecology;  Laterality: N/A;  . MINIMALLY INVASIVE EXCISION OF ATRIAL MYXOMA Right 07/11/2016   Procedure: MINIMALLY INVASIVE RESECTION OF RIGHT ATRIAL LIPOMA WITH CLOSURE OF PATENT FORAMEN OVALE;  Surgeon: Rexene Alberts, MD;  Location: [facility];  Service: Open Heart Surgery;  Laterality: Right;  . OVARIAN CYST REMOVAL Right 1990  . TEE WITHOUT CARDIOVERSION N/A 07/11/2016   Procedure: TRANSESOPHAGEAL ECHOCARDIOGRAM (TEE);  Surgeon: Rexene Alberts, MD;  Location: [facility];  Service: Open Heart Surgery;  Laterality: N/A;    OB History    Gravida Para Term Preterm AB Living   4 4 4     4    SAB TAB Ectopic Multiple Live Births                   Home Medications    Prior to Admission medications   Medication Sig Start Date End Date Taking? Authorizing Provider  acetaminophen (TYLENOL) 500 MG tablet Take 500 mg by mouth daily as needed (pain).   Yes Historical Provider, MD  albuterol (PROVENTIL HFA;VENTOLIN HFA) 108 (90 Base) MCG/ACT inhaler Inhale 2 puffs into the lungs every 6 (six)  hours as needed (asthmatic bronchitis).   Yes Historical Provider, MD  Cyanocobalamin (VITAMIN B-12 PO) Take 1 tablet by mouth daily.   Yes Historical Provider, MD  EPINEPHrine (EPIPEN 2-PAK) 0.3 mg/0.3 mL IJ SOAJ injection Inject 0.3 mg into the muscle once as needed (severe allergic reaction).   Yes Historical Provider, MD  fluticasone (FLONASE) 50 MCG/ACT nasal spray Place 2 sprays into both nostrils daily. 08/31/14  Yes Timmothy Euler, MD  Multiple Vitamin (MULTIVITAMIN WITH MINERALS) TABS tablet Take 1 tablet by mouth daily.   Yes Historical Provider, MD  Polyethyl Glycol-Propyl Glycol (SYSTANE OP) Place 1 drop into both eyes at bedtime.   Yes Historical Provider, MD    Family History Family History  Problem Relation Age of Onset  . Cancer Mother   . CAD Father     MI  at age 65  . Stroke Father   . Cancer Maternal Grandfather   . Stroke Paternal Grandfather   . Kidney disease Paternal Grandfather     Social History Social History  Substance Use Topics  . Smoking status: Never Smoker  . Smokeless tobacco: Never Used  . Alcohol use 0.0 oz/week     Comment: 3 glasses wine per week non recently     Allergies   Avocado; Macadamia nut oil; Mango flavor; Other; Peanuts [peanut oil]; Ciprofloxacin; Demerol [meperidine]; Iodinated diagnostic agents; Macrobid [nitrofurantoin]; Latex; Dulcolax [bisacodyl]; and Sulfamethoxazole   Review of Systems Review of Systems  All other systems reviewed and are negative.    Physical Exam Updated Vital Signs BP 116/92 (BP Location: Right Arm)   Pulse 106   Temp 98.2 F (36.8 C) (Oral)   Resp 25   Ht 5\' 8"  (1.727 m)   Wt 61.7 kg   LMP 05/20/2014   SpO2 100%   BMI 20.68 kg/m   Physical Exam  Constitutional: She appears well-developed and well-nourished. No distress.  HENT:  Head: Normocephalic and atraumatic.  Neck: Neck supple.  Cardiovascular: Tachycardia present.   Pulmonary/Chest: Effort normal and breath sounds normal.  No respiratory distress. She has no wheezes. She has no rales.  Abdominal: Soft. She exhibits no distension. There is no tenderness. There is no rebound and no guarding.  Musculoskeletal: She exhibits no edema.  Neurological: She is alert.  Skin: She is not diaphoretic.  Psychiatric: She has a normal mood and affect. Her behavior is normal.  Nursing note and vitals reviewed.    ED Treatments / Results  Labs (all labs ordered are listed, but only abnormal results are displayed) Labs Reviewed  BASIC METABOLIC PANEL - Abnormal; Notable for the following:       Result Value   Potassium 3.4 (*)    Glucose, Bld 121 (*)    All other components within normal limits  URINALYSIS, ROUTINE W REFLEX MICROSCOPIC - Abnormal; Notable for the following:    Color, Urine COLORLESS (*)    Specific Gravity, Urine 1.002 (*)    Hgb urine dipstick MODERATE (*)    Bacteria, UA RARE (*)    Squamous Epithelial / LPF 0-5 (*)    All other components within normal limits  CBC  PREGNANCY, URINE  MAGNESIUM  TSH  I-STAT TROPOININ, ED    EKG  EKG Interpretation None       ED ECG REPORT   Date: 10/27/2016  Rate: 136  Rhythm: Ectopic atrial tachycardia   QRS Axis: normal  Intervals: normal  ST/T Wave abnormalities: ST depressions inferiorly  Conduction Disutrbances:none  Narrative Interpretation:   Old EKG Reviewed: changes noted  I have personally reviewed the EKG tracing and agree with the computerized printout as noted.   Radiology Dg Chest Port 1 View  Result Date: 10/27/2016 CLINICAL DATA:  Near syncope, tachycardia EXAM: PORTABLE CHEST 1 VIEW COMPARISON:  08/01/2016 FINDINGS: The heart size and mediastinal contours are within normal limits. Both lungs are clear. The visualized skeletal structures are unremarkable. IMPRESSION: No active disease. Electronically Signed   By: Kathreen Devoid   On: 10/27/2016 15:30    Procedures Procedures (including critical care time)  Medications Ordered  in ED Medications  metoprolol tartrate (LOPRESSOR) tablet 25 mg (25 mg Oral Given 10/27/16 1926)  sodium chloride 0.9 % bolus 1,000 mL (0 mLs Intravenous Stopped 10/27/16 1524)     Initial Impression / Assessment and Plan / ED Course  I have reviewed the triage vital signs and the nursing notes.  Pertinent labs & imaging results that were available during my care of the patient were reviewed by me and considered in my medical decision making (see chart for details).  Clinical Course as of Oct 27 1944  Mon Oct 27, 2016  1531 I spoke with Wannetta Sender, cardmaster.  Cardiology will consult.    [EW]    Clinical Course User Index [EW] Clayton Bibles, PA-C    Afebrile nontoxic patient with hx right atrial mass excision and PFO closure in September, on metoprolol until 11 days ago, taken off for orthostasis, now with episodes of palpitations.  Found to have ectopic atrial tachycardia. Discussed pt with Dr Ellender Hose. Pt admitted to cardiology.      Final Clinical Impressions(s) / ED Diagnoses   Final diagnoses:  Atrial flutter with rapid ventricular response [facility])    New Prescriptions New Prescriptions   No medications on file     Clayton Bibles, PA-C 10/27/16 [place], MD 10/29/16 2056
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
## 2016-10-27 NOTE — Progress Notes (Signed)
Pt returned to exercise today.  Pt absent due to school dismissal early for winter break.  Pt reports she talked with someone at the office this morning regarding fast heart rate.  appt made for 2:00 to see Hal Meng.  Pt felt able to exercise and was cleared to do so by office nurse.  Pt participated in exercise today with no complaints during exercise.  Pt remains quite anxious and nervous about her heart "racing".  After exercise completed pt took her monitor off.  As pt was preparing to leave pt came to the desk and stated that her HR was racing again.  Pt quickly placed back on the monitor. HR showed 150's SVT.  Resolved with Valsalva maneuvers.  bp 100/70.  Pt with resolution to SR.  Pt denied any further complaints and is heading toward the office for her appt. Pt reports having this sensation prior to her surgery but had not worn a holter monitor or had a cardiology work up.  Pt given copy of todays session, rehab report and tracings for review.  Will continue to monitor. Cherre Huger, BSN

## 2016-10-27 NOTE — ED Triage Notes (Addendum)
Per EMS pt from home called out for near syncope.  September patient had 2 inch cardiac lipoma  removed. Patient was at cardiac rehab here and captured an event - patient felt dizzy and lightheaded and made an appointment with Dr. Stanford Breed- cardiologist today at 2pm. Pt states dizziness and lightheadedness returned- no syncope. Resting HR was 130's. + orthostatic. Patient HR from laying to sitting went from 130 to 265bpm with extreme dizziness and shortness of breath. Pt HR decreased when returned to supine and with deep breathing.   18 Left AC with approximately 366ml of NS

## 2016-10-27 NOTE — Telephone Encounter (Signed)
Thank you :)

## 2016-10-27 NOTE — Telephone Encounter (Signed)
Spoke with pt states that recently stopped Lopressor and has been having orthostatic BP ever since in the morning. She states that she has been having SOB, palpitations and dizziness. She states that she has h/o orthostatic hypotension and palpitations. She states that this happens daily upon waking and maybe a few time daily she has no medications, and episodes last <65minutes, she states that she has to lay down and this will help her recover and have the palpitations stop faster.  Scheduled appt today with Eulas Post @2pm 
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
## 2016-10-27 NOTE — Telephone Encounter (Signed)
New message   Pt c/o BP issue: STAT if pt c/o blurred vision, one-sided weakness or slurred speech  1. What are your last 5 BP readings? unknown 2. Are you having any other symptoms (ex. Dizziness, headache, blurred vision, passed out)? no 3. What is your BP issue? diastolic low , feeling faint

## 2016-10-27 NOTE — Progress Notes (Signed)
Thanks, she later cancelled the appt and went to the hospital. But since I was on call at the hospital, I saw her any way. I have reviewed the rehab report and tracing she brought with her in the ED.

## 2016-10-27 NOTE — ED Notes (Signed)
Pt placed on bedpan

## 2016-10-27 NOTE — Telephone Encounter (Signed)
New message  Pt's husband called to state that the patient is being transported to the hospital

## 2016-10-27 NOTE — Telephone Encounter (Signed)
In separate note, patient's appt cancelled, she is currently being evaluated in ED for elevated heart rate, orthostatic hypotension and presyncope.

## 2016-10-27 NOTE — ED Notes (Signed)
Portable Xray at bedside.

## 2016-10-27 NOTE — Telephone Encounter (Signed)
noted 

## 2016-10-28 ENCOUNTER — Encounter (HOSPITAL_COMMUNITY): Payer: Self-pay | Admitting: Cardiology

## 2016-10-28 DIAGNOSIS — I4892 Unspecified atrial flutter: Secondary | ICD-10-CM | POA: Diagnosis not present

## 2016-10-28 LAB — BASIC METABOLIC PANEL
Anion gap: 7 (ref 5–15)
BUN: 10 mg/dL (ref 6–20)
CO2: 23 mmol/L (ref 22–32)
CREATININE: 0.59 mg/dL (ref 0.44–1.00)
Calcium: 9 mg/dL (ref 8.9–10.3)
Chloride: 109 mmol/L (ref 101–111)
GFR calc Af Amer: >60 mL/min (ref >60)
GLUCOSE: 89 mg/dL (ref 65–99)
POTASSIUM: 3.3 mmol/L — AB (ref 3.5–5.1)
SODIUM: 139 mmol/L (ref 135–145)

## 2016-10-28 MED ORDER — ASPIRIN 81 MG PO TBEC: 81.0000 mg | DELAYED_RELEASE_TABLET | Freq: Every day | ORAL | Status: DC

## 2016-10-28 MED ORDER — METOPROLOL TARTRATE 25 MG PO TABS: 25.0000 mg | ORAL_TABLET | Freq: Two times a day (BID) | ORAL | 6 refills | Status: DC

## 2016-10-28 NOTE — Progress Notes (Signed)
Patient arrived to the floor, heart rate in the 130's, no complaints, Vitals were stable, patient complained of dizziness with position changes. Patient oriented to unit, call bell in reach.

## 2016-10-28 NOTE — Discharge Summary (Signed)
Discharge Summary    Patient ID: Emily Joseph,  MRN: [id number], DOB/AGE: 01-11-1971 45 y.o.  Admit date: 10/27/2016 Discharge date: 10/28/2016  Primary Care Provider: Lucretia Kern Primary Cardiologist: Dr. Stanford Breed  Discharge Diagnoses    Active Problems:   Atrial flutter with rapid ventricular response (HCC)   Allergies Allergies  Allergen Reactions  . Avocado Shortness Of Breath and Nausea And Vomiting  . Macadamia Nut Oil Hives and Swelling    LIPS SWELL   . Mango Flavor Swelling    SWELLING OF THE LIPS  . Other Other (See Comments)    [name]  . Peanuts [Peanut Oil] Other (See Comments)    Tested positive on allergy test.  . Ciprofloxacin Other (See Comments)    SEVERE JOINT PAIN  . Demerol [Meperidine] Other (See Comments)    HALLUCINATIONS   . Iodinated Diagnostic Agents Hives    Patient had IVP @ age 49 and broke out in [place]  . Macrobid [Nitrofurantoin] Other (See Comments)    CHEST PAIN  . Latex Swelling    Please use paper tape or nitrile gloves  . Dulcolax [Bisacodyl] Palpitations    Felt light headed and dizzy  . Sulfamethoxazole Other (See Comments)    Childhood reaction - upset stomach    Diagnostic Studies/Procedures    None _____________   History of Present Illness     Emily Joseph is a pleasant 45 yo Caucasian female with PMH of HTN, patent foramen ovale, and R atrial lipoma. In August 2017 that showed EF 50-55%, large right atrial mass noted. She subsequently underwent coronary CT on 06/29/2016 that showed 0 coronary calcium score, normal coronary origin with no evidence of CAD. Large right atrial mass measuring 42 mm in the right-to-left direction. She eventually underwent right atrial mass resection by Dr. Roxy Manns on 07/11/2016, she also underwent sure of patent foramen ovale during the same procedure. Pathology confirms lipoma. She  recovered quite quickly and was extubated on the same day of procedure. Postop, she did have mild tachycardia and was placed on 25 mg twice a day of metoprolol. A repeat echocardiogram obtained on 08/2015/2017 showed normal EF 55-60%, no regional wall motion abnormality, no residual right atrial mass noted. Of note, after surgery, during her follow-up with cardiologist office on 07/29/2016, she complained of some dizziness on the 25 mg twice a day of metoprolol, therefore the dose was cut back down to 12.5 mg twice a day. She was seen by Dr. Stanford Breed again in the office on 09/16/2016 at which time she was doing well. One year follow-up was recommended. She contacted the cardiology office on 10/16/2016 due to concern of low blood pressure, she was eventually taken off of her metoprolol.  In the past week, she has had 4 episodes palpitations. They usually last less than 5 minutes. She denies any chest pain or shortness breath. On last Friday, Saturday and Sunday, she had one episode of palpitations each. Over a week ago, she also appears to have an episode of vasovagal episode where her heart rate suddenly dropped from 110 down to low 50s and she had significant dizziness. However he quickly recovered. This morning prior to cardiac rehabilitation, she had some palpitation. During cardiac rehabilitation, she was initially in sinus rhythm, however went into what appears to be atrial flutter with RVR versus ectopic atrial tachycardia. She eventually came out by  herself. She was allowed to go home. While home, she started having recurrent palpitation. She also complaining of some flushing sensation as well. She was initially placed on the office schedule to be seen in the clinic on 12/18. However due to recurrent flushing sensation and worsening symptom, she eventually called EMS and was taken to [facility]. Per patient report, on EMS arrival, heart rate was in the 260 range. However, we were unable to locate EMS  record at that time. While in the emergency room, her heart rate has been persistent in the 130s. Chest x-ray was negative for acute process. She did complain of increased thirst recently, have her blood glucose level is only mildly elevated in the 120s. Urinalysis is normal without any sign of UTI. Cardiology has been consulted for 2:1 atrial flutter with RVR.   Hospital Course     Consultants: None  She was admitted and restarted on metoprolol 25mg  BID. Her CHADSvasc score was 1, therefore she was not started on anticoagulation, only ASA 81mg . She had pregnancy-induced hypertension in the past but has never been diagnosed with hypertension otherwise and her blood pressure typically runs low. She has had borderline sugars in the past but never been diagnosed with diabetes mellitus. TSH was normal this admission. She converted back into SR.   She was seen and examined by Dr. Stanford Breed and determined stable for discharge. I have made follow up appt in the office. She could be considered for ablation in the future if recurrent symptoms.  _____________  Discharge Vitals Blood pressure 99/64, pulse 72, temperature 98.6 F (37 C), temperature source Oral, resp. rate 16, height 5\' 8"  (1.727 m), weight 134 lb 3.2 oz (60.9 kg), last menstrual period 05/20/2014, SpO2 98 %.  Filed Weights   10/27/16 1455 10/28/16 0536  Weight: 136 lb (61.7 kg) 134 lb 3.2 oz (60.9 kg)    Labs & Radiologic Studies    CBC  Recent Labs  10/27/16 1455  WBC 7.9  HGB 14.1  HCT 40.9  MCV 84.9  PLT Q000111Q   Basic Metabolic Panel  Recent Labs  10/27/16 1455 10/28/16 0344  NA 140 139  K 3.4* 3.3*  CL 109 109  CO2 23 23  GLUCOSE 121* 89  BUN 10 10  CREATININE 0.66 0.59  CALCIUM 9.5 9.0  MG 2.0  --    Liver Function Tests No results for input(s): AST, ALT, ALKPHOS, BILITOT, PROT, ALBUMIN in the last 72 hours. No results for input(s): LIPASE, AMYLASE in the last 72 hours. Cardiac Enzymes No results for  input(s): CKTOTAL, CKMB, CKMBINDEX, TROPONINI in the last 72 hours. BNP Invalid input(s): POCBNP D-Dimer No results for input(s): DDIMER in the last 72 hours. Hemoglobin A1C No results for input(s): HGBA1C in the last 72 hours. Fasting Lipid Panel No results for input(s): CHOL, HDL, LDLCALC, TRIG, CHOLHDL, LDLDIRECT in the last 72 hours. Thyroid Function Tests  Recent Labs  10/27/16 1825  TSH 2.131   _____________  Dg Chest Port 1 View  Result Date: 10/27/2016 CLINICAL DATA:  Near syncope, tachycardia EXAM: PORTABLE CHEST 1 VIEW COMPARISON:  08/01/2016 FINDINGS: The heart size and mediastinal contours are within normal limits. Both lungs are clear. The visualized skeletal structures are unremarkable. IMPRESSION: No active disease. Electronically Signed   By: Kathreen Devoid   On: 10/27/2016 15:30   Disposition   Pt is being discharged home today in good condition.  Follow-up Plans & Appointments    Follow-up Information  Almyra Deforest, [place] On 11/07/2016.   Specialties:  Cardiology, Radiology Why:  at 8:45am for your follow up appt.  Contact information: [address] [place] [address] [phone]          Discharge Instructions    Diet - low sodium heart healthy    Complete by:  As directed    Increase activity slowly    Complete by:  As directed       Discharge Medications   Current Discharge Medication List    START taking these medications   Details  aspirin EC 81 MG EC tablet Take 1 tablet (81 mg total) by mouth daily.    metoprolol tartrate (LOPRESSOR) 25 MG tablet Take 1 tablet (25 mg total) by mouth 2 (two) times daily. Qty: 60 tablet, Refills: 6      CONTINUE these medications which have NOT CHANGED   Details  acetaminophen (TYLENOL) 500 MG tablet Take 500 mg by mouth daily as needed (pain).    albuterol (PROVENTIL HFA;VENTOLIN HFA) 108 (90 Base) MCG/ACT inhaler Inhale 2 puffs into the lungs every 6 (six) hours as needed  (asthmatic bronchitis).    Cyanocobalamin (VITAMIN B-12 PO) Take 1 tablet by mouth daily.    EPINEPHrine (EPIPEN 2-PAK) 0.3 mg/0.3 mL IJ SOAJ injection Inject 0.3 mg into the muscle once as needed (severe allergic reaction).    fluticasone (FLONASE) 50 MCG/ACT nasal spray Place 2 sprays into both nostrils daily. Qty: 16 g, Refills: 6   Associated Diagnoses: Acute maxillary sinusitis, recurrence not specified    Multiple Vitamin (MULTIVITAMIN WITH MINERALS) TABS tablet Take 1 tablet by mouth daily.    Polyethyl Glycol-Propyl Glycol (SYSTANE OP) Place 1 drop into both eyes at bedtime.           Outstanding Labs/Studies   None  Duration of Discharge Encounter   Greater than 30 minutes including physician time.  Signed, Reino Bellis NP-C 10/28/2016, 1:42 PM

## 2016-10-28 NOTE — Progress Notes (Signed)
Patient Name: Emily Joseph Date of Encounter: 10/28/2016  Primary Cardiologist: Dr [facility] Problem List     Active Problems:   Atrial flutter with rapid ventricular response (HCC)     Subjective   No chest pain or dyspnea  Inpatient Medications    Scheduled Meds: . aspirin EC  81 mg Oral Daily  . enoxaparin (LOVENOX) injection  40 mg Subcutaneous Q24H  . fluticasone  2 spray Each Nare Daily  . metoprolol tartrate  25 mg Oral BID  . multivitamin with minerals  1 tablet Oral Daily   Continuous Infusions:  PRN Meds: acetaminophen, albuterol, ondansetron (ZOFRAN) IV   Vital Signs    Vitals:   10/27/16 2004 10/28/16 0300 10/28/16 0536 10/28/16 0953  BP: 119/77 (!) 92/53 (!) 93/56 105/71  Pulse: (!) 130 84 (!) 116 92  Resp: 18 20 18 18   Temp: 97.8 F (36.6 C) 98.4 F (36.9 C) 98.5 F (36.9 C) 98.2 F (36.8 C)  TempSrc: Oral Oral Oral Oral  SpO2: 100% 98% 98%   Weight:   134 lb 3.2 oz (60.9 kg)   Height:        Intake/Output Summary (Last 24 hours) at 10/28/16 1055 Last data filed at 10/28/16 0959  Gross per 24 hour  Intake              480 ml  Output              151 ml  Net              329 ml   Filed Weights   10/27/16 1455 10/28/16 0536  Weight: 136 lb (61.7 kg) 134 lb 3.2 oz (60.9 kg)    Physical Exam   GEN: Well nourished, well developed, in no acute distress.  HEENT: Grossly normal.  Neck: Supple Cardiac: RRR Respiratory: CTA GI: Soft, nontender, nondistended MS: no deformity or atrophy. Skin: warm and dry, no rash. Neuro:  Strength and sensation are intact.   Labs    CBC  Recent Labs  10/27/16 1455  WBC 7.9  HGB 14.1  HCT 40.9  MCV 84.9  PLT Q000111Q   Basic Metabolic Panel  Recent Labs  10/27/16 1455 10/28/16 0344  NA 140 139  K 3.4* 3.3*  CL 109 109  CO2 23 23  GLUCOSE 121* 89  BUN 10 10  CREATININE 0.66 0.59  CALCIUM 9.5 9.0  MG 2.0  --    Thyroid Function Tests  Recent Labs  10/27/16 1825  TSH  2.131    Telemetry    sinus - Personally Reviewed  Radiology    Dg Chest Port 1 View  Result Date: 10/27/2016 CLINICAL DATA:  Near syncope, tachycardia EXAM: PORTABLE CHEST 1 VIEW COMPARISON:  08/01/2016 FINDINGS: The heart size and mediastinal contours are within normal limits. Both lungs are clear. The visualized skeletal structures are unremarkable. IMPRESSION: No active disease. Electronically Signed   By: Kathreen Devoid   On: 10/27/2016 15:30     Patient Profile     45 year old female with past medical history of right atrial lipoma resection in September 2017, fibromyalgia with new onset atrial flutter. Patient had follow-up echocardiogram October 2017 showed normal LV systolic function and no right atrial mass.   Assessment & Plan    1 new onset atrial flutter-patient back in sinus today. Continue metoprolol. Recent echocardiogram showed normal LV function. TSH normal. Her only embolic risk factor is female sex. CHADSvasc 1. She had pregnancy-induced  hypertension in the past but has never been diagnosed with hypertension otherwise and her blood pressure typically runs low. She has had borderline sugars in the past but never been diagnosed with diabetes mellitus. I therefore do not think she requires long-term anticoagulation. We will treat with aspirin. We can consider referral for ablation for recurrent symptoms despite metoprolol.  2 Status post resection of right atrial lipoma-no recurrence on follow-up echo.    3 Hypokalemia-supplement  DC today with TOC appt one week FU with me 6-8 weeks D1  Signed, Kirk Ruths, MD  10/28/2016, 10:55 AM

## 2016-10-29 ENCOUNTER — Encounter (HOSPITAL_COMMUNITY): Payer: 59

## 2016-10-29 ENCOUNTER — Telehealth (HOSPITAL_COMMUNITY): Payer: Self-pay | Admitting: *Deleted

## 2016-10-29 NOTE — Telephone Encounter (Signed)
-----   Message from Lelon Perla, MD sent at 10/29/2016  8:08 AM EST ----- Regarding: RE: Return to cardiac rehab Can return now Kirk Ruths  ----- Message ----- From: Rowe Pavy, RN Sent: 10/29/2016   7:57 AM To: Lelon Perla, MD Subject: Return to cardiac rehab                        Dr. Stanford Breed,  The above pt discharged from hospital on yesterday.  Has follow up appt with Almyra Deforest on next Friday 12/29.  Do you prefer she hold on returning to exercise until seen in follow up?   Thank you for the advisement Maurice Small RN

## 2016-10-29 NOTE — Telephone Encounter (Signed)
Returned call to patient who left a message earlier today.  Pt calling out today due to starting new dosage medication.  Pt took metoprolol 25 mg at 10:00.  Pt checked her bp 80/50. Pt didn;t feel bad but didn't feel like she should exercise.  Pt is trying to get on a 9am and 9pm dosage for metoprolol so her bp would be ok to exercise at 11;15 class.  Pt hopes to return to exercise on Friday. Cherre Huger, BSN

## 2016-10-31 ENCOUNTER — Encounter (HOSPITAL_COMMUNITY)
Admission: RE | Admit: 2016-10-31 | Discharge: 2016-10-31 | Disposition: A | Discharge status: Home / Self Care | Payer: 59 | Source: Ambulatory Visit | Attending: Cardiology | Admitting: Cardiology

## 2016-10-31 DIAGNOSIS — Z48812 Encounter for surgical aftercare following surgery on the circulatory system: Secondary | ICD-10-CM | POA: Diagnosis not present

## 2016-10-31 DIAGNOSIS — I5189 Other ill-defined heart diseases: Secondary | ICD-10-CM

## 2016-11-05 ENCOUNTER — Encounter (HOSPITAL_COMMUNITY)
Admission: RE | Admit: 2016-11-05 | Discharge: 2016-11-05 | Disposition: A | Discharge status: Home / Self Care | Payer: 59 | Source: Ambulatory Visit | Attending: Cardiology | Admitting: Cardiology

## 2016-11-05 DIAGNOSIS — Z48812 Encounter for surgical aftercare following surgery on the circulatory system: Secondary | ICD-10-CM | POA: Diagnosis not present

## 2016-11-05 DIAGNOSIS — I5189 Other ill-defined heart diseases: Secondary | ICD-10-CM

## 2016-11-05 NOTE — Progress Notes (Signed)
Pt has upcoming appt on Monday 12/29 at 0800 with Almyra Deforest.  Pt given copy of rehab report for review.  Pt reports having the fluttering sensation in the late afternoon prior to taken her second dose of metoprolol.  Bp remain soft on the dosage of 25mg  twice a day.  Pt tolerated light exercise with no complaints and no "fluttering" sensation. Will follow up with pt after office visit. Cherre Huger, BSN

## 2016-11-06 ENCOUNTER — Telehealth: Payer: Self-pay | Admitting: Internal Medicine

## 2016-11-06 NOTE — Telephone Encounter (Addendum)
45 yo F with a h/o R atrial lipoma s/p resection 4 months ago c/b AFl RVR vs. atrial tachycardia 10 days after Metoprolol 12.5 mg b.i.d was stopped who is calling re relative hypotension, dizziness, and fatigue on Metoprolol 25 mg b.i.d.   Blood pressure as low as 90/50, heart rate in the 70s.   She feels unwell on the higher dose of Metoprolol 25 mg b.i.d. and wishes to go back down to Metoprolol 12.5 mg b.i.d. She plans to see her provider in the morning.  I told her that is fine to reduce her dose overnight and that she may discuss this further in the morning with her provider.  In passing, she also mentions that she has having twinges in her chest lasting less than a second and not associated with any shortness of breath, diaphoresis, or nausea, no radiation, not associated with exertion and not relieved by rest. She has not taken any Tylenol or NSAIDs in an attempt to reduce this.  I offered her the opportunity to come to the emergency department to have this evaluated with an EKG. She does not feel that this is warranted and prefers to have this evaluated by her provider in the morning. In the meantime, she will take Tylenol.  I reemphasized that if her chest pain becomes worse or she develops any additional symptoms, she should come to the emergency department.  Allyson Sabal, MD

## 2016-11-07 ENCOUNTER — Encounter: Payer: Self-pay | Admitting: Physician Assistant

## 2016-11-07 ENCOUNTER — Encounter (HOSPITAL_COMMUNITY)
Admission: RE | Admit: 2016-11-07 | Discharge: 2016-11-07 | Disposition: A | Discharge status: Home / Self Care | Payer: 59 | Source: Ambulatory Visit | Attending: Cardiology | Admitting: Cardiology

## 2016-11-07 ENCOUNTER — Ambulatory Visit (INDEPENDENT_AMBULATORY_CARE_PROVIDER_SITE_OTHER): Payer: 59 | Admitting: Physician Assistant

## 2016-11-07 VITALS — BP 107/67 | HR 93 | Ht 67.5 in | Wt 136.4 lb

## 2016-11-07 DIAGNOSIS — E876 Hypokalemia: Secondary | ICD-10-CM

## 2016-11-07 DIAGNOSIS — R0789 Other chest pain: Secondary | ICD-10-CM | POA: Diagnosis not present

## 2016-11-07 DIAGNOSIS — I5189 Other ill-defined heart diseases: Secondary | ICD-10-CM

## 2016-11-07 DIAGNOSIS — I4892 Unspecified atrial flutter: Secondary | ICD-10-CM | POA: Diagnosis not present

## 2016-11-07 DIAGNOSIS — I519 Heart disease, unspecified: Secondary | ICD-10-CM | POA: Diagnosis not present

## 2016-11-07 DIAGNOSIS — Z48812 Encounter for surgical aftercare following surgery on the circulatory system: Secondary | ICD-10-CM | POA: Diagnosis not present

## 2016-11-07 LAB — BASIC METABOLIC PANEL
BUN: 17 mg/dL (ref 7–25)
CALCIUM: 8.8 mg/dL (ref 8.6–10.2)
CO2: 24 mmol/L (ref 20–31)
Chloride: 104 mmol/L (ref 98–110)
Creat: 0.66 mg/dL (ref 0.50–1.10)
GLUCOSE: 107 mg/dL — AB (ref 65–99)
Potassium: 4 mmol/L (ref 3.5–5.3)
SODIUM: 138 mmol/L (ref 135–146)
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
## 2016-11-07 NOTE — Patient Instructions (Addendum)
Your physician recommends that you schedule a follow-up appointment in: TWO MONTHS with Dr. Stanford Breed  If you have recurrent palpitations, Isaac Laud PA has recommended a 30 day monitor. Please call our office  Your physician recommends that you return for lab work today (BMET)

## 2016-11-11 ENCOUNTER — Telehealth: Payer: Self-pay | Admitting: *Deleted

## 2016-11-11 NOTE — Telephone Encounter (Signed)
Spoke with pt, she was recently seen by [place] and forgot to ask if she should conmtinue taking aspirin. She reports no fluttering or problems. Will forward for dr Stanford Breed review

## 2016-11-12 ENCOUNTER — Encounter (HOSPITAL_COMMUNITY)
Admission: RE | Admit: 2016-11-12 | Discharge: 2016-11-12 | Disposition: A | Discharge status: Home / Self Care | Payer: 59 | Source: Ambulatory Visit | Attending: Cardiology | Admitting: Cardiology

## 2016-11-12 DIAGNOSIS — I5189 Other ill-defined heart diseases: Secondary | ICD-10-CM

## 2016-11-12 DIAGNOSIS — I519 Heart disease, unspecified: Secondary | ICD-10-CM | POA: Insufficient documentation

## 2016-11-12 DIAGNOSIS — Z9889 Other specified postprocedural states: Secondary | ICD-10-CM | POA: Insufficient documentation

## 2016-11-12 DIAGNOSIS — Z48812 Encounter for surgical aftercare following surgery on the circulatory system: Secondary | ICD-10-CM | POA: Insufficient documentation

## 2016-11-12 NOTE — Telephone Encounter (Signed)
[name]

## 2016-11-12 NOTE — Telephone Encounter (Signed)
Spoke with pt, Aware of dr crenshaw's recommendations.  °

## 2016-11-12 NOTE — Telephone Encounter (Signed)
I did see her in the office, for now, given no recurrence, I did allow her to decrease the metoprolol to 12.5mg  BID.

## 2016-11-13 NOTE — Progress Notes (Signed)
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  acetaminophen (TYLENOL) 500 MG tablet, Take 500 mg by mouth daily as needed (pain)., Disp: , Rfl:  .  albuterol (PROVENTIL HFA;VENTOLIN HFA) 108 (90 Base) MCG/ACT inhaler, Inhale 2 puffs into the lungs every 6 (six) hours as needed (asthmatic bronchitis)., Disp: , Rfl:  .  aspirin EC 81 MG EC tablet, Take 1 tablet (81 mg total) by mouth daily., Disp: , Rfl:  .  Cyanocobalamin (VITAMIN B-12 PO), Take 1 tablet by mouth daily., Disp: , Rfl:  .  EPINEPHrine (EPIPEN 2-PAK) 0.3 mg/0.3 mL IJ SOAJ injection, Inject 0.3 mg into the muscle once as needed (severe allergic reaction)., Disp: , Rfl:  .  fluticasone (FLONASE) 50 MCG/ACT nasal spray, Place 2 sprays into both nostrils daily., Disp: 16 g, Rfl: 6 .  metoprolol tartrate (LOPRESSOR) 12.5 mg TABS tablet, Take 12.5 mg by mouth 2 (two) times daily., Disp: , Rfl:  .  Multiple Vitamin (MULTIVITAMIN WITH MINERALS) TABS tablet, Take 1 tablet by mouth daily., Disp: , Rfl:  .  Polyethyl Glycol-Propyl Glycol (SYSTANE OP), Place 1 drop into both eyes at bedtime., Disp: , Rfl:   Past Medical History: Past Medical History:  Diagnosis Date  . Adenomyosis    not papanicolaou smear of cervix and cervical HPV  . Anxiety 06/08/2016   -about her health and about taking medications  . Arthritis    ?  Marland Kitchen Asthma   .  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 Chronic fatigue 06/08/2016   -eval with rheum x2, neurology, gastroenterology  . Chronic pain 06/08/2016   -all over her whole life; joints, muscles, head -numerous evaluations, Dr. Trudie Reed in 2017, [place] [facility] -seeing Dr. Jaynee Eagles, Neurologist for back pain with radicular symptoms  . Complication of anesthesia    "with epidural bp bottoms out"  . Dysrhythmia    fast hr  . Eosinophilic esophagitis    Sees Dr. Carlean Purl  . Fibromyalgia   . GERD (gastroesophageal reflux disease)   . Heart murmur   . History of pneumonia    when pt was pregnant  . Iron deficiency anemia due to chronic blood loss    Menorrhagia, s/p complete hysterectomy, ovaries remain hx  . Irritable bowel syndrome 01/27/2013   Dr Carlean Purl 02/2016   . Microhematuria   . Mouth problem    failed gum graft 1/16  . Nasal airway abnormality   . Nasal obstruction 06/26/2015   Seen by ENT 06-26-15.  May need sleep study to see if having apnea    . Palpitations   . 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 Shortness of breath dyspnea   . SUI (stress urinary incontinence, female)   . Syncope    with last child with  epideral  . UTI (urinary tract infection) 07/14/2016   >=100,000 COLONIES/mL KLEBSIELLA PNEUMONIA    Tobacco Use: History  Smoking Status  . Never Smoker  Smokeless Tobacco  . Never Used    Labs: Recent Review Flowsheet Data    Labs for ITP Cardiac and Pulmonary Rehab Latest Ref Rng & Units 07/11/2016 07/11/2016 07/11/2016 07/11/2016 07/12/2016   Hemoglobin A1c 4.8 - 5.6 % - - - - -   PHART 7.350 - 7.450 7.320(L) 7.362 7.340(L) - -   PCO2ART 32.0 - 48.0 mmHg 47.3 37.0 40.6 - -   HCO3 20.0 - 28.0 mmol/L 24.7 21.2 22.0 - -   TCO2 0 - 100 mmol/L _0 ACIDBASEDEF 0.0 - 2.0 mmol/L 2.0 4.0(H) 4.0(H) - -   O2SAT % 100.0 100.0 99.0 - -      Capillary Blood Glucose: Lab  Results  Component Value Date   GLUCAP 95 10/06/2016   GLUCAP 128 (H) 07/13/2016   GLUCAP 114 (H) 07/13/2016   GLUCAP 125 (H) 07/13/2016   GLUCAP 134 (H) 07/12/2016     Exercise Target Goals:    Exercise Program Goal: Individual exercise prescription set with THRR, safety & activity barriers. Participant demonstrates ability to understand and report RPE using BORG scale, to self-measure pulse accurately, and to acknowledge the importance of the exercise prescription.  Exercise Prescription Goal: Starting with aerobic activity 30 plus minutes a day, 3 days per week for initial exercise prescription. Provide home exercise prescription and guidelines that participant acknowledges understanding prior to discharge.  Activity Barriers & Risk Stratification:     Activity Barriers & Cardiac Risk Stratification - 09/11/16 0905      Activity Barriers & Cardiac Risk Stratification   Activity Barriers Other (comment)   Comments wears orthotic in left shoe, right hip pain, right should and neck pain    Cardiac Risk Stratification Low      6 Minute Walk:     6 Minute Walk    Row Name 09/11/16 1346         6 Minute Walk   Phase Initial     Distance 1400 feet     Walk Time 6 minutes     # of Rest Breaks 0     MPH 2.65     METS 4.75     RPE 12     VO2 Peak 16.64     Symptoms No     Resting HR 98 bpm     Resting BP 104/70     Max Ex. HR 112 bpm     Max Ex. BP 102/70     2 Minute Post BP 103/71        Initial Exercise Prescription:     Initial Exercise Prescription - 09/11/16 1400      Date of Initial Exercise RX and Referring Provider   Date 09/11/16   Referring Provider Kirk Ruths MD     Treadmill   MPH 2.6   Grade 0   Minutes 10   METs 2.99     Bike   Level 0.5   Minutes 10   METs 2.57     NuStep   Level 3   Minutes 10   METs 2     Prescription Details   Frequency (times per week) 3   Duration Progress to 30 minutes of continuous aerobic  without signs/symptoms of physical distress     Intensity   THRR 40-80% of Max Heartrate  70-140   Ratings of Perceived Exertion 11-13     Progression   Progression Continue to progress workloads to maintain intensity without signs/symptoms of physical distress.     Resistance Training   Training Prescription Yes   Weight 2lbs   Reps 10-12      Perform Capillary Blood Glucose checks as needed.  Exercise Prescription Changes:     Exercise Prescription Changes    Row Name 09/16/16 1000 09/17/16 1600 10/14/16 1100 11/11/16 1100       Exercise Review   Progression Yes Yes Yes Yes      Response to Exercise   Blood Pressure (Admit) 100/68 106/62 106/73 107/72    Blood Pressure (Exercise) 104/62 110/72 118/70 118/82    Blood Pressure (Exit) 98/60 101/69 103/62 104/70    Heart Rate (Admit) 89 bpm 82 bpm 89 bpm 84 bpm    Heart Rate (Exercise) 114 bpm 106 bpm 114 bpm 121 bpm    Heart Rate (Exit) 86 bpm 81 bpm 81 bpm 81 bpm    Rating of Perceived Exertion (Exercise) _0 Symptoms none none none none    Comments  -  - Reviewed HEP on  09/24/16 Reviewed HEP on  09/24/16    Duration Progress to 30 minutes of continuous aerobic without signs/symptoms of physical distress Progress to 30 minutes of continuous aerobic without signs/symptoms of physical distress Progress to 30 minutes of continuous aerobic without signs/symptoms of physical distress Progress to 30 minutes of continuous aerobic without signs/symptoms of physical distress    Intensity THRR unchanged THRR unchanged THRR unchanged THRR unchanged      Progression   Average METs 2.4 2.5 2.6 2.7      Resistance Training   Training Prescription Yes Yes Yes Yes    Weight 2lbs 2lbs 2lbs 2lbs    Reps 10-12 10-12 10-12 10-12      Treadmill   MPH (P)  2  decreased speed per pt. tolerance/comfort level 2.3  decreased speed per pt. tolerance/comfort level 2.5  decreased speed per pt. tolerance/comfort level 2.5   decreased speed per pt. tolerance/comfort level    Grade 0 _1 Minutes _2 METs 2.99 3.08 3.26 3.26      Bike   Level 0.5 0.5 0.5 0.5    Minutes _3 METs 2.55 2.55 2.52 2.51      NuStep   Level _4 Minutes _5 METs 1.7 1.9 2 2.3      Home Exercise Plan   Plans to continue exercise at  -  - Home  Reviewed on 09/24/16 see progress note Home  Reviewed on 09/24/16 see progress note    Frequency  -  - Add 2 additional days to program exercise sessions. Add 2 additional days to program exercise sessions.       Exercise Comments:     Exercise Comments    Row Name 09/16/16 1045 10/14/16 1142 11/11/16 1135       Exercise Comments Reduced TM speed per pt comfort level. Will continue to monitor exercise progression/tolerance Reviewed METs and goals. Pt is tolerating exercise well; will continue to monitor exercise progression. Reviewed METs and goals. Pt is tolerating exercise well; will continue to monitor exercise progression.        Discharge Exercise Prescription (Final Exercise Prescription Changes):  Exercise Prescription Changes - 11/11/16 1100      Exercise Review   Progression Yes     Response to Exercise   Blood Pressure (Admit) 107/72   Blood Pressure (Exercise) 118/82   Blood Pressure (Exit) 104/70   Heart Rate (Admit) 84 bpm   Heart Rate (Exercise) 121 bpm   Heart Rate (Exit) 81 bpm   Rating of Perceived Exertion (Exercise) 12   Symptoms none   Comments Reviewed HEP on  09/24/16   Duration Progress to 30 minutes of continuous aerobic without signs/symptoms of physical distress   Intensity THRR unchanged     Progression   Average METs 2.7     Resistance Training   Training Prescription Yes   Weight 2lbs   Reps 10-12     Treadmill   MPH 2.5  decreased speed per pt. tolerance/comfort level   Grade 1   Minutes 10   METs 3.26     Bike   Level 0.5   Minutes 10   METs 2.51     NuStep   Level 4    Minutes 10   METs 2.3     Home Exercise Plan   Plans to continue exercise at Home  Reviewed on 09/24/16 see progress note   Frequency Add 2 additional days to program exercise sessions.      Nutrition:  Target Goals: Understanding of nutrition guidelines, daily intake of sodium <1548m, cholesterol <2037m calories 30% from fat and 7% or less from saturated fats, daily to have 5 or more servings of fruits and vegetables.  Biometrics:     Pre Biometrics - 09/11/16 1403      Pre Biometrics   Waist Circumference 30.75 inches   Hip Circumference 38.5 inches   Waist to Hip Ratio 0.8 %   Triceps Skinfold 34 mm   % Body Fat 32.8 %   Grip Strength 30 kg   Flexibility 13 in   Single Leg Stand 30 seconds       Nutrition Therapy Plan and Nutrition Goals:   Nutrition Discharge: Nutrition Scores:   Nutrition Goals Re-Evaluation:   Psychosocial: Target Goals: Acknowledge presence or absence of depression, maximize coping skills, provide positive support system. Participant is able to verbalize types and ability to use techniques and skills needed for reducing stress and depression.  Initial Review & Psychosocial Screening:     Initial Psych Review & Screening - 09/11/16 [id number]   Comments Brief psychosocial assessment reveals no needs, no further intervention at this time.     Screening Interventions   Interventions Encouraged to exercise      Quality of Life Scores:     Quality of Life - 09/11/16 1405      Quality of Life Scores   Health/Function Pre 11.89 %   Socioeconomic Pre 22.31 %   Psych/Spiritual Pre 23.57 %   Family Pre 27.3 %   GLOBAL Pre 19.23 %      PHQ-9: Recent Review Flowsheet Data    [facility] 09/15[facility]2016 03/26/2016 03/12/2016 10/09/2015 10/03/2015   Decreased Interest 0 0 0 0 0   Down, Depressed, Hopeless 0 0 0 0 0   PHQ - 2 Score 0 0 0 0 0      Psychosocial Evaluation and  Intervention:     Psychosocial Evaluation - 11/13/16 1651      Psychosocial Evaluation & Interventions   Interventions Stress management  education;Encouraged to exercise with the program and follow exercise prescription;Relaxation education   Comments continue to monitor.  Provide support and encouragement.   Continued Psychosocial Services Needed No      Psychosocial Re-Evaluation:     Psychosocial Re-Evaluation    [place] Name 09/18/16 1409 10/16/16 1551 11/13/16 1651         Psychosocial Re-Evaluation   Interventions Stress management education;Relaxation education;Encouraged to attend Cardiac Rehabilitation for the exercise Encouraged to attend Cardiac Rehabilitation for the exercise;Stress management education;Relaxation education Encouraged to attend Cardiac Rehabilitation for the exercise;Stress management education;Relaxation education     Comments  -  - Showing improvement regarding anxiety about her heart, medications and exercise     Continued Psychosocial Services Needed  - No  -        Vocational Rehabilitation: Provide vocational rehab assistance to qualifying candidates.   Vocational Rehab Evaluation & Intervention:     Vocational Rehab - 09/11/16 1833      Initial Vocational Rehab Evaluation & Intervention   Assessment shows need for Vocational Rehabilitation No  Pt does not plan to return back to work.      Education: Education Goals: Education classes will be provided on a weekly basis, covering required topics. Participant will state understanding/return demonstration of topics presented.  Learning Barriers/Preferences:     Learning Barriers/Preferences - 09/10/16 1019      Learning Barriers/Preferences   Learning Barriers None   Learning Preferences None      Education Topics: Count Your Pulse:  -Group instruction provided by verbal instruction, demonstration, patient participation and written materials to support subject.  Instructors address  importance of being able to find your pulse and how to count your pulse when at home without a heart monitor.  Patients get hands on experience counting their pulse with staff help and individually.   Heart Attack, Angina, and Risk Factor Modification:  -Group instruction provided by verbal instruction, video, and written materials to support subject.  Instructors address signs and symptoms of angina and heart attacks.    Also discuss risk factors for heart disease and how to make changes to improve heart health risk factors. Flowsheet Row CARDIAC REHAB PHASE II EXERCISE from 11/12/2016 in [facility]  Date  10/17/16  Educator  Barnet Pall RN  Instruction Review Code  2- meets goals/outcomes      Functional Fitness:  -Group instruction provided by verbal instruction, demonstration, patient participation, and written materials to support subject.  Instructors address safety measures for doing things around the house.  Discuss how to get up and down off the floor, how to pick things up properly, how to safely get out of a chair without assistance, and balance training. Flowsheet Row CARDIAC REHAB PHASE II EXERCISE from 11/12/2016 in [facility]  Date  09/19/16  Instruction Review Code  2- meets goals/outcomes      Meditation and Mindfulness:  -Group instruction provided by verbal instruction, patient participation, and written materials to support subject.  Instructor addresses importance of mindfulness and meditation practice to help reduce stress and improve awareness.  Instructor also leads participants through a meditation exercise.  Flowsheet Row CARDIAC REHAB PHASE II EXERCISE from 11/12/2016 in [facility]  Date  11/05/16  Instruction Review Code  2- meets goals/outcomes      Stretching for Flexibility and Mobility:  -Group instruction provided by verbal instruction, patient participation, and  written materials to support subject.  Instructors lead participants through series of stretches that are designed to increase flexibility thus improving mobility.  These stretches are additional exercise for major muscle groups that are typically performed during regular warm up and cool down. Flowsheet Row CARDIAC REHAB PHASE II EXERCISE from 11/12/2016 in [place]  Date  10/15/16  Instruction Review Code  2- meets goals/outcomes      Hands Only CPR Anytime:  -Group instruction provided by verbal instruction, video, patient participation and written materials to support subject.  Instructors co-teach with [facility] video for hands only CPR.  Participants get hands on experience with mannequins.   Nutrition I class: Heart Healthy Eating:  -Group instruction provided by PowerPoint slides, verbal discussion, and written materials to support subject matter. The instructor gives an explanation and review of the Therapeutic Lifestyle Changes diet recommendations, which includes a discussion on lipid goals, dietary fat, sodium, fiber, plant stanol/sterol esters, sugar, and the components of a well-balanced, healthy diet.   Nutrition II class: Lifestyle Skills:  -Group instruction provided by PowerPoint slides, verbal discussion, and written materials to support subject matter. The instructor gives an explanation and review of label reading, grocery shopping for heart health, heart healthy recipe modifications, and ways to make healthier choices when eating out. Flowsheet Row CARDIAC REHAB PHASE II EXERCISE from 11/12/2016 in [facility]  Date  10/21/16  Educator  RD  Instruction Review Code  2- meets goals/outcomes      Diabetes Question & Answer:  -Group instruction provided by PowerPoint slides, verbal discussion, and written materials to support subject matter. The instructor gives an explanation and review of diabetes co-morbidities, pre-  and post-prandial blood glucose goals, pre-exercise blood glucose goals, signs, symptoms, and treatment of hypoglycemia and hyperglycemia, and foot care basics.   Diabetes Blitz:  -Group instruction provided by PowerPoint slides, verbal discussion, and written materials to support subject matter. The instructor gives an explanation and review of the physiology behind type 1 and type 2 diabetes, diabetes medications and rational behind using different medications, pre- and post-prandial blood glucose recommendations and Hemoglobin A1c goals, diabetes diet, and exercise including blood glucose guidelines for exercising safely.    Portion Distortion:  -Group instruction provided by PowerPoint slides, verbal discussion, written materials, and food models to support subject matter. The instructor gives an explanation of serving size versus portion size, changes in portions sizes over the last 20 years, and what consists of a serving from each food group. Flowsheet Row CARDIAC REHAB PHASE II EXERCISE from 11/12/2016 in [place]  Date  11/12/16  Educator  RD  Instruction Review Code  2- meets goals/outcomes      Stress Management:  -Group instruction provided by verbal instruction, video, and written materials to support subject matter.  Instructors review role of stress in heart disease and how to cope with stress positively.   Flowsheet Row CARDIAC REHAB PHASE II EXERCISE from 11/12/2016 in [facility]  Date  09/24/16  Instruction Review Code  2- meets goals/outcomes      Exercising on Your Own:  -Group instruction provided by verbal instruction, power point, and written materials to support subject.  Instructors discuss benefits of exercise, components of exercise, frequency and intensity of exercise, and end points for exercise.  Also discuss use of nitroglycerin and activating EMS.  Review options of places to exercise outside of  rehab.  Review guidelines for sex with heart disease.  Cardiac Drugs I:  -Group instruction provided by verbal instruction and written materials to support subject.  Instructor reviews cardiac drug classes: antiplatelets, anticoagulants, beta blockers, and statins.  Instructor discusses reasons, side effects, and lifestyle considerations for each drug class.   Cardiac Drugs II:  -Group instruction provided by verbal instruction and written materials to support subject.  Instructor reviews cardiac drug classes: angiotensin converting enzyme inhibitors (ACE-I), angiotensin II receptor blockers (ARBs), nitrates, and calcium channel blockers.  Instructor discusses reasons, side effects, and lifestyle considerations for each drug class. Flowsheet Row CARDIAC REHAB PHASE II EXERCISE from 11/12/2016 in [facility]  Date  09/26/16  Instruction Review Code  2- meets goals/outcomes      Anatomy and Physiology of the Circulatory System:  -Group instruction provided by verbal instruction, video, and written materials to support subject.  Reviews functional anatomy of heart, how it relates to various diagnoses, and what role the heart plays in the overall system.   Knowledge Questionnaire Score:     Knowledge Questionnaire Score - 09/11/16 1346      Knowledge Questionnaire Score   Pre Score 24/24      Core Components/Risk Factors/Patient Goals at Admission:     Personal Goals and Risk Factors at Admission - 09/11/16 0917      Core Components/Risk Factors/Patient Goals on Admission   Increase Strength and Stamina Yes   Intervention Provide advice, education, support and counseling about physical activity/exercise needs.;Develop an individualized exercise prescription for aerobic and resistive training based on initial evaluation findings, risk stratification, comorbidities and participant's personal goals.   Expected Outcomes Achievement of increased  cardiorespiratory fitness and enhanced flexibility, muscular endurance and strength shown through measurements of functional capacity and personal statement of participant.   Hypertension Yes   Intervention Provide education on lifestyle modifcations including regular physical activity/exercise, weight management, moderate sodium restriction and increased consumption of fresh fruit, vegetables, and low fat dairy, alcohol moderation, and smoking cessation.;Monitor prescription use compliance.   Expected Outcomes Short Term: Continued assessment and intervention until BP is < 140/55m HG in hypertensive participants. < 130/887mHG in hypertensive participants with diabetes, heart failure or chronic kidney disease.;Long Term: Maintenance of blood pressure at goal levels.   Stress Yes   Intervention Offer individual and/or small group education and counseling on adjustment to heart disease, stress management and health-related lifestyle change. Teach and support self-help strategies.;Refer participants experiencing significant psychosocial distress to appropriate mental health specialists for further evaluation and treatment. When possible, include family members and significant others in education/counseling sessions.   Expected Outcomes Short Term: Participant demonstrates changes in health-related behavior, relaxation and other stress management skills, ability to obtain effective social support, and compliance with psychotropic medications if prescribed.;Long Term: Emotional wellbeing is indicated by absence of clinically significant psychosocial distress or social isolation.   Personal Goal Other Yes   Personal Goal increase stamina, be able to do more with family, decrease anxiety   Intervention individualized exercise prescription, education classes, relaxation and meditation classes   Expected Outcomes increase overall functional capacity and ability to be active with family      Core  Components/Risk Factors/Patient Goals Review:      Goals and Risk Factor Review    Row Name 10/14/16 0812 11/13/16 1450           Core Components/Risk Factors/Patient Goals Review   Personal Goals Review Increase Strength and Stamina  -      Review Pt is exercising at  home. Pt walks on treadmill 2-3x week for 13 minutes, Discussed time progression and importance of warm up and cool down. Pt is now walking on TM for 12-15 minutes 1-2x/day. Pt states that some days are better than others.       Expected Outcomes Pt will continue to show progression time/speed on treadmill. Pt will continue to show progression in exercise tolerance. Pt will continue to show progression time/speed on treadmill. Pt will continue to show progression in exercise tolerance.         Core Components/Risk Factors/Patient Goals at Discharge (Final Review):      Goals and Risk Factor Review - 11/13/16 1450      Core Components/Risk Factors/Patient Goals Review   Review Pt is now walking on TM for 12-15 minutes 1-2x/day. Pt states that some days are better than others.    Expected Outcomes Pt will continue to show progression time/speed on treadmill. Pt will continue to show progression in exercise tolerance.      ITP Comments:     ITP Comments    Row Name 09/10/16 1023 09/11/16 0920 11/07/16 1125       ITP Comments Dr.Traci Radford Pax, Medical Director  Dr.Traci Turner, Medical Director  Attended CPR education class; goals and outcomes met.        Comments:  Pt is making expected progress toward personal goals after completing 19 sessions. Pt seems to able to tolerate the Metoprolol that was restarted due to aflutter. RepeatPsychosocial Assessment: Pt with supportive family, denies any Psychosocial needs or interventions at this time. Pt with improving outlook and noted less anxiety and improved tolerance of the metoprolol dose. Pt observed interacting positively with other participants. Recommend continued  exercise and life style modification education including  stress management and relaxation techniques to decrease cardiac risk profile. Cherre Huger, BSN

## 2016-11-14 ENCOUNTER — Encounter (HOSPITAL_COMMUNITY)
Admission: RE | Admit: 2016-11-14 | Discharge: 2016-11-14 | Disposition: A | Discharge status: Home / Self Care | Payer: 59 | Source: Ambulatory Visit | Attending: Cardiology | Admitting: Cardiology

## 2016-11-14 DIAGNOSIS — Z48812 Encounter for surgical aftercare following surgery on the circulatory system: Secondary | ICD-10-CM | POA: Diagnosis not present

## 2016-11-14 DIAGNOSIS — I5189 Other ill-defined heart diseases: Secondary | ICD-10-CM

## 2016-11-17 ENCOUNTER — Encounter (HOSPITAL_COMMUNITY)
Admission: RE | Admit: 2016-11-17 | Discharge: 2016-11-17 | Disposition: A | Discharge status: Home / Self Care | Payer: 59 | Source: Ambulatory Visit | Attending: Cardiology | Admitting: Cardiology

## 2016-11-17 DIAGNOSIS — I5189 Other ill-defined heart diseases: Secondary | ICD-10-CM

## 2016-11-17 DIAGNOSIS — Z48812 Encounter for surgical aftercare following surgery on the circulatory system: Secondary | ICD-10-CM | POA: Diagnosis not present

## 2016-11-19 ENCOUNTER — Encounter (HOSPITAL_COMMUNITY)
Admission: RE | Admit: 2016-11-19 | Discharge: 2016-11-19 | Disposition: A | Discharge status: Home / Self Care | Payer: 59 | Source: Ambulatory Visit | Attending: Cardiology | Admitting: Cardiology

## 2016-11-19 DIAGNOSIS — Z48812 Encounter for surgical aftercare following surgery on the circulatory system: Secondary | ICD-10-CM | POA: Diagnosis not present

## 2016-11-19 DIAGNOSIS — I5189 Other ill-defined heart diseases: Secondary | ICD-10-CM

## 2016-11-21 ENCOUNTER — Encounter (HOSPITAL_COMMUNITY)
Admission: RE | Admit: 2016-11-21 | Discharge: 2016-11-21 | Disposition: A | Discharge status: Home / Self Care | Payer: 59 | Source: Ambulatory Visit | Attending: Cardiology | Admitting: Cardiology

## 2016-11-21 DIAGNOSIS — Z48812 Encounter for surgical aftercare following surgery on the circulatory system: Secondary | ICD-10-CM | POA: Diagnosis not present

## 2016-11-21 DIAGNOSIS — I5189 Other ill-defined heart diseases: Secondary | ICD-10-CM

## 2016-11-24 ENCOUNTER — Encounter (HOSPITAL_COMMUNITY)
Admission: RE | Admit: 2016-11-24 | Discharge: 2016-11-24 | Disposition: A | Discharge status: Home / Self Care | Payer: 59 | Source: Ambulatory Visit | Attending: Cardiology | Admitting: Cardiology

## 2016-11-24 DIAGNOSIS — Z48812 Encounter for surgical aftercare following surgery on the circulatory system: Secondary | ICD-10-CM | POA: Diagnosis not present

## 2016-11-24 DIAGNOSIS — I5189 Other ill-defined heart diseases: Secondary | ICD-10-CM

## 2016-11-26 ENCOUNTER — Encounter (HOSPITAL_COMMUNITY): Payer: 59

## 2016-11-28 ENCOUNTER — Encounter (HOSPITAL_COMMUNITY): Payer: 59

## 2016-12-01 ENCOUNTER — Encounter (HOSPITAL_COMMUNITY)
Admission: RE | Admit: 2016-12-01 | Discharge: 2016-12-01 | Disposition: A | Discharge status: Home / Self Care | Payer: 59 | Source: Ambulatory Visit | Attending: Cardiology | Admitting: Cardiology

## 2016-12-01 DIAGNOSIS — I5189 Other ill-defined heart diseases: Secondary | ICD-10-CM

## 2016-12-01 DIAGNOSIS — Z48812 Encounter for surgical aftercare following surgery on the circulatory system: Secondary | ICD-10-CM | POA: Diagnosis not present

## 2016-12-03 ENCOUNTER — Encounter (HOSPITAL_COMMUNITY)
Admission: RE | Admit: 2016-12-03 | Discharge: 2016-12-03 | Disposition: A | Discharge status: Home / Self Care | Payer: 59 | Source: Ambulatory Visit | Attending: Cardiology | Admitting: Cardiology

## 2016-12-03 DIAGNOSIS — Z48812 Encounter for surgical aftercare following surgery on the circulatory system: Secondary | ICD-10-CM | POA: Diagnosis not present

## 2016-12-03 DIAGNOSIS — I5189 Other ill-defined heart diseases: Secondary | ICD-10-CM

## 2016-12-05 ENCOUNTER — Encounter (HOSPITAL_COMMUNITY)
Admission: RE | Admit: 2016-12-05 | Discharge: 2016-12-05 | Disposition: A | Discharge status: Home / Self Care | Payer: 59 | Source: Ambulatory Visit | Attending: Cardiology | Admitting: Cardiology

## 2016-12-05 DIAGNOSIS — Z48812 Encounter for surgical aftercare following surgery on the circulatory system: Secondary | ICD-10-CM | POA: Diagnosis not present

## 2016-12-05 DIAGNOSIS — I5189 Other ill-defined heart diseases: Secondary | ICD-10-CM

## 2016-12-08 ENCOUNTER — Encounter (HOSPITAL_COMMUNITY)
Admission: RE | Admit: 2016-12-08 | Discharge: 2016-12-08 | Disposition: A | Discharge status: Home / Self Care | Payer: 59 | Source: Ambulatory Visit | Attending: Cardiology | Admitting: Cardiology

## 2016-12-08 DIAGNOSIS — Z48812 Encounter for surgical aftercare following surgery on the circulatory system: Secondary | ICD-10-CM | POA: Diagnosis not present

## 2016-12-08 DIAGNOSIS — I5189 Other ill-defined heart diseases: Secondary | ICD-10-CM

## 2016-12-10 ENCOUNTER — Encounter (HOSPITAL_COMMUNITY)
Admission: RE | Admit: 2016-12-10 | Discharge: 2016-12-10 | Disposition: A | Discharge status: Home / Self Care | Payer: 59 | Source: Ambulatory Visit | Attending: Cardiology | Admitting: Cardiology

## 2016-12-10 DIAGNOSIS — I5189 Other ill-defined heart diseases: Secondary | ICD-10-CM

## 2016-12-10 DIAGNOSIS — Z48812 Encounter for surgical aftercare following surgery on the circulatory system: Secondary | ICD-10-CM | POA: Diagnosis not present

## 2016-12-11 NOTE — Progress Notes (Signed)
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  acetaminophen (TYLENOL) 500 MG tablet, Take 500 mg by mouth daily as needed (pain)., Disp: , Rfl:  .  albuterol (PROVENTIL HFA;VENTOLIN HFA) 108 (90 Base) MCG/ACT inhaler, Inhale 2 puffs into the lungs every 6 (six) hours as needed (asthmatic bronchitis)., Disp: , Rfl:  .  aspirin EC 81 MG EC tablet, Take 1 tablet (81 mg total) by mouth daily., Disp: , Rfl:  .  Cyanocobalamin (VITAMIN B-12 PO), Take 1 tablet by mouth daily., Disp: , Rfl:  .  EPINEPHrine (EPIPEN 2-PAK) 0.3 mg/0.3 mL IJ SOAJ injection, Inject 0.3 mg into the muscle once as needed (severe allergic reaction)., Disp: , Rfl:  .  fluticasone (FLONASE) 50 MCG/ACT nasal spray, Place 2 sprays into both nostrils daily., Disp: 16 g, Rfl: 6 .  metoprolol tartrate (LOPRESSOR) 12.5 mg TABS tablet, Take 12.5 mg by mouth 2 (two) times daily., Disp: , Rfl:  .  Multiple Vitamin (MULTIVITAMIN WITH MINERALS) TABS tablet, Take 1 tablet by mouth daily., Disp: , Rfl:  .  Polyethyl Glycol-Propyl Glycol (SYSTANE OP), Place 1 drop into both eyes at bedtime., Disp: , Rfl:   Past Medical History: Past Medical History:  Diagnosis Date  . Adenomyosis    not papanicolaou smear of cervix and cervical HPV  . Anxiety 06/08/2016   -about her health and about taking medications  . Arthritis    ?  Marland Kitchen Asthma   .  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 Chronic fatigue 06/08/2016   -eval with rheum x2, neurology, gastroenterology  . Chronic pain 06/08/2016   -all over her whole life; joints, muscles, head -numerous evaluations, Dr. Trudie Reed in 2017, [place] [facility] -seeing Dr. Jaynee Eagles, Neurologist for back pain with radicular symptoms  . Complication of anesthesia    "with epidural bp bottoms out"  . Dysrhythmia    fast hr  . Eosinophilic esophagitis    Sees Dr. Carlean Purl  . Fibromyalgia   . GERD (gastroesophageal reflux disease)   . Heart murmur   . History of pneumonia    when pt was pregnant  . Iron deficiency anemia due to chronic blood loss    Menorrhagia, s/p complete hysterectomy, ovaries remain hx  . Irritable bowel syndrome 01/27/2013   Dr Carlean Purl 02/2016   . Microhematuria   . Mouth problem    failed gum graft 1/16  . Nasal airway abnormality   . Nasal obstruction 06/26/2015   Seen by ENT 06-26-15.  May need sleep study to see if having apnea    . Palpitations   . 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 Shortness of breath dyspnea   . SUI (stress urinary incontinence, female)   . Syncope    with last child with  epideral  . UTI (urinary tract infection) 07/14/2016   >=100,000 COLONIES/mL KLEBSIELLA PNEUMONIA    Tobacco Use: History  Smoking Status  . Never Smoker  Smokeless Tobacco  . Never Used    Labs: Recent Review Flowsheet Data    Labs for ITP Cardiac and Pulmonary Rehab Latest Ref Rng & Units 07/11/2016 07/11/2016 07/11/2016 07/11/2016 07/12/2016   Hemoglobin A1c 4.8 - 5.6 % - - - - -   PHART 7.350 - 7.450 7.320(L) 7.362 7.340(L) - -   PCO2ART 32.0 - 48.0 mmHg 47.3 37.0 40.6 - -   HCO3 20.0 - 28.0 mmol/L 24.7 21.2 22.0 - -   TCO2 0 - 100 mmol/L _0 ACIDBASEDEF 0.0 - 2.0 mmol/L 2.0 4.0(H) 4.0(H) - -   O2SAT % 100.0 100.0 99.0 - -      Capillary Blood Glucose: Lab  Results  Component Value Date   GLUCAP 95 10/06/2016   GLUCAP 128 (H) 07/13/2016   GLUCAP 114 (H) 07/13/2016   GLUCAP 125 (H) 07/13/2016   GLUCAP 134 (H) 07/12/2016     Exercise Target Goals:    Exercise Program Goal: Individual exercise prescription set with THRR, safety & activity barriers. Participant demonstrates ability to understand and report RPE using BORG scale, to self-measure pulse accurately, and to acknowledge the importance of the exercise prescription.  Exercise Prescription Goal: Starting with aerobic activity 30 plus minutes a day, 3 days per week for initial exercise prescription. Provide home exercise prescription and guidelines that participant acknowledges understanding prior to discharge.  Activity Barriers & Risk Stratification:     Activity Barriers & Cardiac Risk Stratification - 09/11/16 0905      Activity Barriers & Cardiac Risk Stratification   Activity Barriers Other (comment)   Comments wears orthotic in left shoe, right hip pain, right should and neck pain    Cardiac Risk Stratification Low      6 Minute Walk:     6 Minute Walk    Row Name 09/11/16 1346         6 Minute Walk   Phase Initial     Distance 1400 feet     Walk Time 6 minutes     # of Rest Breaks 0     MPH 2.65     METS 4.75     RPE 12     VO2 Peak 16.64     Symptoms No     Resting HR 98 bpm     Resting BP 104/70     Max Ex. HR 112 bpm     Max Ex. BP 102/70     2 Minute Post BP 103/71        Initial Exercise Prescription:     Initial Exercise Prescription - 09/11/16 1400      Date of Initial Exercise RX and Referring Provider   Date 09/11/16   Referring Provider Kirk Ruths MD     Treadmill   MPH 2.6   Grade 0   Minutes 10   METs 2.99     Bike   Level 0.5   Minutes 10   METs 2.57     NuStep   Level 3   Minutes 10   METs 2     Prescription Details   Frequency (times per week) 3   Duration Progress to 30 minutes of continuous aerobic  without signs/symptoms of physical distress     Intensity   THRR 40-80% of Max Heartrate  70-140   Ratings of Perceived Exertion 11-13     Progression   Progression Continue to progress workloads to maintain intensity without signs/symptoms of physical distress.     Resistance Training   Training Prescription Yes   Weight 2lbs   Reps 10-12      Perform Capillary Blood Glucose checks as needed.  Exercise Prescription Changes:     Exercise Prescription Changes    Row Name 09/16/16 1000 09/17/16 1600 10/14/16 1100 11/11/16 1100 12/09/16 1400     Exercise Review   Progression _0      Response to Exercise   Blood Pressure (Admit) 100/68 106/62 106/73 107/72 102/68   Blood Pressure (Exercise) 104/62 110/72 118/70 118/82 118/60   Blood Pressure (Exit) 98/60 101/69 103/62 104/70 94/68   Heart Rate (Admit) 89 bpm 82 bpm 89 bpm 84 bpm 70 bpm   Heart Rate (Exercise) 114 bpm 106 bpm 114 bpm 121 bpm 112 bpm   Heart Rate (Exit) 86 bpm 81 bpm 81 bpm 81 bpm 78 bpm   Rating of Perceived Exertion (Exercise) _1 Symptoms _2    Comments  -  - Reviewed HEP on  09/24/16 Reviewed HEP on  09/24/16 Reviewed HEP on  09/24/16   Duration Progress to 30 minutes of continuous aerobic without signs/symptoms of physical distress Progress to 30 minutes of continuous aerobic without signs/symptoms of physical distress Progress to 30 minutes of continuous aerobic without signs/symptoms of physical distress Progress to 30 minutes of continuous aerobic without signs/symptoms of physical distress Progress to 30 minutes of continuous aerobic without signs/symptoms of physical distress   Intensity _3      Progression   Average METs 2.4 2.5 2.6 2.7 2.9     Resistance Training   Training Prescription _4    Weight 2lbs 2lbs 2lbs 2lbs 2lbs   Reps 10-12 10-12 10-12 10-12 10-12      Treadmill   MPH (P)  2  decreased speed per pt. tolerance/comfort level 2.3  decreased speed per pt. tolerance/comfort level 2.5  decreased speed per pt. tolerance/comfort level 2.5  decreased speed per pt. tolerance/comfort level 2.5  decreased speed per pt. tolerance/comfort level   Grade 0 _5 Minutes _6 METs 2.99 3.08 3.26 3.26 3.26     Bike   Level 0.5 0.5 0.5 0.5 0.5   Minutes _7 METs 2.55 2.55 2.52 2.51 2.51     NuStep   Level _8 Minutes _9 METs 1.7 1.9 2 2.3 2.3     Home Exercise Plan   Plans to continue exercise at  -  - Home  Reviewed on 09/24/16 see progress note Home  Reviewed on 09/24/16 see progress note Home  Reviewed on 09/24/16 see progress note   Frequency  -  - Add 2 additional days to program exercise sessions. Add 2 additional days to program exercise sessions. Add 2 additional days to program exercise sessions.      Exercise Comments:     Exercise Comments    Row Name 09/16/16 1045 10/14/16 1142 11/11/16 1135 12/09/16 1443     Exercise Comments Reduced TM speed per pt comfort level. Will continue to monitor exercise progression/tolerance Reviewed METs and goals. Pt  is tolerating exercise well; will continue to monitor exercise progression. Reviewed METs and goals. Pt is tolerating exercise well; will continue to monitor exercise progression. Reviewed METs and goals. Pt is tolerating exercise well; will continue to monitor exercise progression.       Discharge Exercise Prescription (Final Exercise Prescription Changes):     Exercise Prescription Changes - 12/09/16 1400      Exercise Review   Progression Yes     Response to Exercise   Blood Pressure (Admit) 102/68   Blood Pressure (Exercise) 118/60   Blood Pressure (Exit) 94/68   Heart Rate (Admit) 70 bpm   Heart Rate (Exercise) 112 bpm   Heart Rate (Exit) 78 bpm   Rating of Perceived Exertion (Exercise) 10   Symptoms none    Comments Reviewed HEP on  09/24/16   Duration Progress to 30 minutes of continuous aerobic without signs/symptoms of physical distress   Intensity THRR unchanged     Progression   Average METs 2.9     Resistance Training   Training Prescription Yes   Weight 2lbs   Reps 10-12     Treadmill   MPH 2.5  decreased speed per pt. tolerance/comfort level   Grade 2   Minutes 10   METs 3.26     Bike   Level 0.5   Minutes 10   METs 2.51     NuStep   Level 4   Minutes 10   METs 2.3     Home Exercise Plan   Plans to continue exercise at Home  Reviewed on 09/24/16 see progress note   Frequency Add 2 additional days to program exercise sessions.      Nutrition:  Target Goals: Understanding of nutrition guidelines, daily intake of sodium '1500mg'$ , cholesterol '200mg'$ , calories 30% from fat and 7% or less from saturated fats, daily to have 5 or more servings of fruits and vegetables.  Biometrics:     Pre Biometrics - 09/11/16 1403      Pre Biometrics   Waist Circumference 30.75 inches   Hip Circumference 38.5 inches   Waist to Hip Ratio 0.8 %   Triceps Skinfold 34 mm   % Body Fat 32.8 %   Grip Strength 30 kg   Flexibility 13 in   Single Leg Stand 30 seconds       Nutrition Therapy Plan and Nutrition Goals:   Nutrition Discharge: Nutrition Scores:   Nutrition Goals Re-Evaluation:   Psychosocial: Target Goals: Acknowledge presence or absence of depression, maximize coping skills, provide positive support system. Participant is able to verbalize types and ability to use techniques and skills needed for reducing stress and depression.  Initial Review & Psychosocial Screening:     Initial Psych Review & Screening - 09/11/16 [place]? Yes   Comments Brief psychosocial assessment reveals no needs, no further intervention at this time.     Screening Interventions   Interventions Encouraged to exercise      Quality of Life  Scores:     Quality of Life - 09/11/16 1405      Quality of Life Scores   Health/Function Pre 11.89 %   Socioeconomic Pre 22.31 %   Psych/Spiritual Pre 23.57 %   Family Pre 27.3 %   GLOBAL Pre 19.23 %      PHQ-9: Recent Review Flowsheet Data    Depression screen [facility] 2/9 09/15/2016 03/26/2016 03/12/2016 10/09/2015 10/03/2015   Decreased Interest 0  0 0 0 0   Down, Depressed, Hopeless 0 0 0 0 0   PHQ - 2 Score 0 0 0 0 0      Psychosocial Evaluation and Intervention:     Psychosocial Evaluation - 12/11/16 1537      Psychosocial Evaluation & Interventions   Interventions Stress management education;Relaxation education;Encouraged to exercise with the program and follow exercise prescription   Comments continue to monitor.  Provide support and encouragement.      Psychosocial Re-Evaluation:     Psychosocial Re-Evaluation    Row Name 09/18/16 1409 10/16/16 1551 11/13/16 1651 12/11/16 1537       Psychosocial Re-Evaluation   Interventions Stress management education;Relaxation education;Encouraged to attend Cardiac Rehabilitation for the exercise Encouraged to attend Cardiac Rehabilitation for the exercise;Stress management education;Relaxation education Encouraged to attend Cardiac Rehabilitation for the exercise;Stress management education;Relaxation education Relaxation education;Stress management education;Encouraged to attend Cardiac Rehabilitation for the exercise    Comments  -  - Showing improvement regarding anxiety about her heart, medications and exercise Showing improvement regarding anxiety about her heart, medications and exercise    Continued Psychosocial Services Needed  - No  -  -       Vocational Rehabilitation: Provide vocational rehab assistance to qualifying candidates.   Vocational Rehab Evaluation & Intervention:     Vocational Rehab - 09/11/16 1833      Initial Vocational Rehab Evaluation & Intervention   Assessment shows need for Vocational  Rehabilitation No  Pt does not plan to return back to work.      Education: Education Goals: Education classes will be provided on a weekly basis, covering required topics. Participant will state understanding/return demonstration of topics presented.  Learning Barriers/Preferences:     Learning Barriers/Preferences - 09/10/16 1019      Learning Barriers/Preferences   Learning Barriers None   Learning Preferences None      Education Topics: Count Your Pulse:  -Group instruction provided by verbal instruction, demonstration, patient participation and written materials to support subject.  Instructors address importance of being able to find your pulse and how to count your pulse when at home without a heart monitor.  Patients get hands on experience counting their pulse with staff help and individually.   Heart Attack, Angina, and Risk Factor Modification:  -Group instruction provided by verbal instruction, video, and written materials to support subject.  Instructors address signs and symptoms of angina and heart attacks.    Also discuss risk factors for heart disease and how to make changes to improve heart health risk factors. Flowsheet Row CARDIAC REHAB PHASE II EXERCISE from 12/10/2016 in [facility]  Date  12/03/16  Educator  Barnet Pall RN  Instruction Review Code  2- meets goals/outcomes      Functional Fitness:  -Group instruction provided by verbal instruction, demonstration, patient participation, and written materials to support subject.  Instructors address safety measures for doing things around the house.  Discuss how to get up and down off the floor, how to pick things up properly, how to safely get out of a chair without assistance, and balance training. Flowsheet Row CARDIAC REHAB PHASE II EXERCISE from 12/10/2016 in [facility] [place] [facility]  Date  09/19/16  Instruction Review Code  2- meets goals/outcomes       Meditation and Mindfulness:  -Group instruction provided by verbal instruction, patient participation, and written materials to support subject.  Instructor addresses importance of mindfulness and meditation practice to  help reduce stress and improve awareness.  Instructor also leads participants through a meditation exercise.  Flowsheet Row CARDIAC REHAB PHASE II EXERCISE from 12/10/2016 in [facility]  Date  11/05/16  Instruction Review Code  2- meets goals/outcomes      Stretching for Flexibility and Mobility:  -Group instruction provided by verbal instruction, patient participation, and written materials to support subject.  Instructors lead participants through series of stretches that are designed to increase flexibility thus improving mobility.  These stretches are additional exercise for major muscle groups that are typically performed during regular warm up and cool down. Flowsheet Row CARDIAC REHAB PHASE II EXERCISE from 12/10/2016 in [place]  Date  12/05/16  Instruction Review Code  2- meets goals/outcomes      Hands Only CPR Anytime:  -Group instruction provided by verbal instruction, video, patient participation and written materials to support subject.  Instructors co-teach with AHA video for hands only CPR.  Participants get hands on experience with mannequins.   Nutrition I class: Heart Healthy Eating:  -Group instruction provided by PowerPoint slides, verbal discussion, and written materials to support subject matter. The instructor gives an explanation and review of the Therapeutic Lifestyle Changes diet recommendations, which includes a discussion on lipid goals, dietary fat, sodium, fiber, plant stanol/sterol esters, sugar, and the components of a well-balanced, healthy diet.   Nutrition II class: Lifestyle Skills:  -Group instruction provided by PowerPoint slides, verbal discussion, and written  materials to support subject matter. The instructor gives an explanation and review of label reading, grocery shopping for heart health, heart healthy recipe modifications, and ways to make healthier choices when eating out. Flowsheet Row CARDIAC REHAB PHASE II EXERCISE from 12/10/2016 in [place]  Date  10/21/16  Educator  RD  Instruction Review Code  2- meets goals/outcomes      Diabetes Question & Answer:  -Group instruction provided by PowerPoint slides, verbal discussion, and written materials to support subject matter. The instructor gives an explanation and review of diabetes co-morbidities, pre- and post-prandial blood glucose goals, pre-exercise blood glucose goals, signs, symptoms, and treatment of hypoglycemia and hyperglycemia, and foot care basics. Flowsheet Row CARDIAC REHAB PHASE II EXERCISE from 12/10/2016 in [place]  Date  11/21/16  Educator  RD  Instruction Review Code  2- meets goals/outcomes      Diabetes Blitz:  -Group instruction provided by PowerPoint slides, verbal discussion, and written materials to support subject matter. The instructor gives an explanation and review of the physiology behind type 1 and type 2 diabetes, diabetes medications and rational behind using different medications, pre- and post-prandial blood glucose recommendations and Hemoglobin A1c goals, diabetes diet, and exercise including blood glucose guidelines for exercising safely.    Portion Distortion:  -Group instruction provided by PowerPoint slides, verbal discussion, written materials, and food models to support subject matter. The instructor gives an explanation of serving size versus portion size, changes in portions sizes over the last 20 years, and what consists of a serving from each food group. Flowsheet Row CARDIAC REHAB PHASE II EXERCISE from 12/10/2016 in [place]  Date  11/12/16   Educator  RD  Instruction Review Code  2- meets goals/outcomes      Stress Management:  -Group instruction provided by verbal instruction, video, and written materials to support subject matter.  Instructors review role of stress in heart disease and how  to cope with stress positively.   Flowsheet Row CARDIAC REHAB PHASE II EXERCISE from 12/10/2016 in [place]  Date  09/24/16  Instruction Review Code  2- meets goals/outcomes      Exercising on Your Own:  -Group instruction provided by verbal instruction, power point, and written materials to support subject.  Instructors discuss benefits of exercise, components of exercise, frequency and intensity of exercise, and end points for exercise.  Also discuss use of nitroglycerin and activating EMS.  Review options of places to exercise outside of rehab.  Review guidelines for sex with heart disease. Flowsheet Row CARDIAC REHAB PHASE II EXERCISE from 12/10/2016 in [place]  Date  12/10/16  Instruction Review Code  2- meets goals/outcomes      Cardiac Drugs I:  -Group instruction provided by verbal instruction and written materials to support subject.  Instructor reviews cardiac drug classes: antiplatelets, anticoagulants, beta blockers, and statins.  Instructor discusses reasons, side effects, and lifestyle considerations for each drug class.   Cardiac Drugs II:  -Group instruction provided by verbal instruction and written materials to support subject.  Instructor reviews cardiac drug classes: angiotensin converting enzyme inhibitors (ACE-I), angiotensin II receptor blockers (ARBs), nitrates, and calcium channel blockers.  Instructor discusses reasons, side effects, and lifestyle considerations for each drug class. Flowsheet Row CARDIAC REHAB PHASE II EXERCISE from 12/10/2016 in [place]  Date  11/19/16  Instruction Review Code  2- meets  goals/outcomes      Anatomy and Physiology of the Circulatory System:  -Group instruction provided by verbal instruction, video, and written materials to support subject.  Reviews functional anatomy of heart, how it relates to various diagnoses, and what role the heart plays in the overall system.   Knowledge Questionnaire Score:     Knowledge Questionnaire Score - 09/11/16 1346      Knowledge Questionnaire Score   Pre Score 24/24      Core Components/Risk Factors/Patient Goals at Admission:     Personal Goals and Risk Factors at Admission - 09/11/16 0917      Core Components/Risk Factors/Patient Goals on Admission   Increase Strength and Stamina Yes   Intervention Provide advice, education, support and counseling about physical activity/exercise needs.;Develop an individualized exercise prescription for aerobic and resistive training based on initial evaluation findings, risk stratification, comorbidities and participant's personal goals.   Expected Outcomes Achievement of increased cardiorespiratory fitness and enhanced flexibility, muscular endurance and strength shown through measurements of functional capacity and personal statement of participant.   Hypertension Yes   Intervention Provide education on lifestyle modifcations including regular physical activity/exercise, weight management, moderate sodium restriction and increased consumption of fresh fruit, vegetables, and low fat dairy, alcohol moderation, and smoking cessation.;Monitor prescription use compliance.   Expected Outcomes Short Term: Continued assessment and intervention until BP is < 140/21m HG in hypertensive participants. < 130/875mHG in hypertensive participants with diabetes, heart failure or chronic kidney disease.;Long Term: Maintenance of blood pressure at goal levels.   Stress Yes   Intervention Offer individual and/or small group education and counseling on adjustment to heart disease, stress management  and health-related lifestyle change. Teach and support self-help strategies.;Refer participants experiencing significant psychosocial distress to appropriate mental health specialists for further evaluation and treatment. When possible, include family members and significant others in education/counseling sessions.   Expected Outcomes Short Term: Participant demonstrates changes in health-related behavior, relaxation and other stress management skills, ability to obtain  effective social support, and compliance with psychotropic medications if prescribed.;Long Term: Emotional wellbeing is indicated by absence of clinically significant psychosocial distress or social isolation.   Personal Goal Other Yes   Personal Goal increase stamina, be able to do more with family, decrease anxiety   Intervention individualized exercise prescription, education classes, relaxation and meditation classes   Expected Outcomes increase overall functional capacity and ability to be active with family      Core Components/Risk Factors/Patient Goals Review:      Goals and Risk Factor Review    Row Name 10/14/16 1121 11/13/16 1450 12/09/16 1443         Core Components/Risk Factors/Patient Goals Review   Personal Goals Review Increase Strength and Stamina  -  -     Review Pt is exercising at home. Pt walks on treadmill 2-3x week for 13 minutes, Discussed time progression and importance of warm up and cool down. Pt is now walking on TM for 12-15 minutes 1-2x/day. Pt states that some days are better than others.  Pt is now walking on TM for 29 minutes and mph/pace has also increased.     Expected Outcomes Pt will continue to show progression time/speed on treadmill. Pt will continue to show progression in exercise tolerance. Pt will continue to show progression time/speed on treadmill. Pt will continue to show progression in exercise tolerance. Pt will continue to show progression time/speed on treadmill. Pt will continue  to show progression in exercise tolerance.        Core Components/Risk Factors/Patient Goals at Discharge (Final Review):      Goals and Risk Factor Review - 12/09/16 1443      Core Components/Risk Factors/Patient Goals Review   Review Pt is now walking on TM for 29 minutes and mph/pace has also increased.   Expected Outcomes Pt will continue to show progression time/speed on treadmill. Pt will continue to show progression in exercise tolerance.      ITP Comments:     ITP Comments    Row Name 09/10/16 1023 09/11/16 0920 11/07/16 1125       ITP Comments Dr.Traci Radford Pax, Medical Director  Dr.Traci Turner, Medical Director  Attended CPR education class; goals and outcomes met.        Comments:  Pt is making expected progress toward personal goals after completing 29 sessions. Psychosocial Assessment  Pt with good support at home from husband and children. Pt interacts positively with healthy coping skills. Generally feels good about her outlook. Improved anxiety related to heart issues.Recommend continued exercise and life style modification education including  stress management and relaxation techniques to decrease cardiac risk profile. Cherre Huger, BSN

## 2016-12-12 ENCOUNTER — Encounter (HOSPITAL_COMMUNITY)
Admission: RE | Admit: 2016-12-12 | Discharge: 2016-12-12 | Disposition: A | Discharge status: Home / Self Care | Payer: 59 | Source: Ambulatory Visit | Attending: Cardiology | Admitting: Cardiology

## 2016-12-12 DIAGNOSIS — I519 Heart disease, unspecified: Secondary | ICD-10-CM | POA: Diagnosis present

## 2016-12-12 DIAGNOSIS — I5189 Other ill-defined heart diseases: Secondary | ICD-10-CM

## 2016-12-12 DIAGNOSIS — Z48812 Encounter for surgical aftercare following surgery on the circulatory system: Secondary | ICD-10-CM | POA: Diagnosis not present

## 2016-12-12 DIAGNOSIS — Z9889 Other specified postprocedural states: Secondary | ICD-10-CM | POA: Insufficient documentation

## 2016-12-15 ENCOUNTER — Encounter (HOSPITAL_COMMUNITY)
Admission: RE | Admit: 2016-12-15 | Discharge: 2016-12-15 | Disposition: A | Discharge status: Home / Self Care | Payer: 59 | Source: Ambulatory Visit | Attending: Cardiology | Admitting: Cardiology

## 2016-12-15 DIAGNOSIS — Z48812 Encounter for surgical aftercare following surgery on the circulatory system: Secondary | ICD-10-CM | POA: Diagnosis not present

## 2016-12-15 DIAGNOSIS — I5189 Other ill-defined heart diseases: Secondary | ICD-10-CM

## 2016-12-17 ENCOUNTER — Encounter (HOSPITAL_COMMUNITY)
Admission: RE | Admit: 2016-12-17 | Discharge: 2016-12-17 | Disposition: A | Discharge status: Home / Self Care | Payer: 59 | Source: Ambulatory Visit | Attending: Cardiology | Admitting: Cardiology

## 2016-12-17 DIAGNOSIS — Z48812 Encounter for surgical aftercare following surgery on the circulatory system: Secondary | ICD-10-CM | POA: Diagnosis not present

## 2016-12-17 DIAGNOSIS — I5189 Other ill-defined heart diseases: Secondary | ICD-10-CM

## 2016-12-19 ENCOUNTER — Encounter (HOSPITAL_COMMUNITY)
Admission: RE | Admit: 2016-12-19 | Discharge: 2016-12-19 | Disposition: A | Discharge status: Home / Self Care | Payer: 59 | Source: Ambulatory Visit | Attending: Cardiology | Admitting: Cardiology

## 2016-12-19 DIAGNOSIS — I5189 Other ill-defined heart diseases: Secondary | ICD-10-CM

## 2016-12-19 DIAGNOSIS — Z48812 Encounter for surgical aftercare following surgery on the circulatory system: Secondary | ICD-10-CM | POA: Diagnosis not present

## 2016-12-22 ENCOUNTER — Encounter (HOSPITAL_COMMUNITY)
Admission: RE | Admit: 2016-12-22 | Discharge: 2016-12-22 | Disposition: A | Discharge status: Home / Self Care | Payer: 59 | Source: Ambulatory Visit | Attending: Cardiology | Admitting: Cardiology

## 2016-12-22 DIAGNOSIS — Z48812 Encounter for surgical aftercare following surgery on the circulatory system: Secondary | ICD-10-CM | POA: Diagnosis not present

## 2016-12-22 DIAGNOSIS — I5189 Other ill-defined heart diseases: Secondary | ICD-10-CM

## 2016-12-24 ENCOUNTER — Encounter (HOSPITAL_COMMUNITY)
Admission: RE | Admit: 2016-12-24 | Discharge: 2016-12-24 | Disposition: A | Discharge status: Home / Self Care | Payer: 59 | Source: Ambulatory Visit | Attending: Cardiology | Admitting: Cardiology

## 2016-12-24 DIAGNOSIS — Z48812 Encounter for surgical aftercare following surgery on the circulatory system: Secondary | ICD-10-CM | POA: Diagnosis not present

## 2016-12-24 DIAGNOSIS — I5189 Other ill-defined heart diseases: Secondary | ICD-10-CM

## 2016-12-26 ENCOUNTER — Encounter (HOSPITAL_COMMUNITY)
Admission: RE | Admit: 2016-12-26 | Discharge: 2016-12-26 | Disposition: A | Discharge status: Home / Self Care | Payer: 59 | Source: Ambulatory Visit | Attending: Cardiology | Admitting: Cardiology

## 2016-12-26 VITALS — Ht 68.0 in | Wt 140.7 lb

## 2016-12-26 DIAGNOSIS — Z48812 Encounter for surgical aftercare following surgery on the circulatory system: Secondary | ICD-10-CM | POA: Diagnosis not present

## 2016-12-26 DIAGNOSIS — I5189 Other ill-defined heart diseases: Secondary | ICD-10-CM

## 2016-12-26 NOTE — Progress Notes (Signed)
Discharge Summary  Patient Details  Name: Emily Joseph MRN: [id number] Date of Birth: July 05, 1971 Referring Provider:     CARDIAC REHAB PHASE II ORIENTATION from 09/11/2016 in [facility]  Referring Provider  Kirk Ruths MD       Number of Visits: 36  Reason for Discharge:  Patient reached a stable level of exercise. Patient independent in their exercise.  Smoking History:  History  Smoking Status  . Never Smoker  Smokeless Tobacco  . 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  ADL UCSD:   Initial Exercise Prescription:     Initial Exercise Prescription - 09/11/16 1400      Date of Initial Exercise RX and Referring Provider   Date 09/11/16   Referring Provider Kirk Ruths MD     Treadmill   MPH 2.6   Grade 0   Minutes 10   METs 2.99     Bike   Level 0.5   Minutes 10   METs 2.57     NuStep   Level 3   Minutes 10   METs 2     Prescription Details   Frequency (times per week) 3   Duration Progress to 30 minutes of continuous aerobic without signs/symptoms of physical distress     Intensity   THRR 40-80% of Max Heartrate 70-140   Ratings of Perceived Exertion 11-13     Progression   Progression Continue to progress workloads to maintain intensity without signs/symptoms of physical distress.     Resistance Training   Training Prescription Yes   Weight 2lbs   Reps 10-12      Discharge Exercise Prescription (Final Exercise Prescription Changes):     Exercise Prescription Changes - 12/26/16 1507      Response to Exercise   Blood Pressure (Admit) 92/60   Blood Pressure (Exercise) 104/64   Blood Pressure (Exit) 92/64   Heart Rate (Admit) 92 bpm   Heart Rate (Exercise) 114 bpm   Heart Rate (Exit) 81 bpm   Rating of Perceived Exertion (Exercise) 9   Symptoms none   Comments reviewed HEP on 09/24/16   Duration Progress to 30 minutes of  aerobic without  signs/symptoms of physical distress   Intensity THRR unchanged     Progression   Average METs 2.9     Resistance Training   Training Prescription Yes   Weight 2lbs   Reps 10-15   Time 10 Minutes     Treadmill   MPH 2.5   Grade 2   Minutes 10   METs 3.6     Bike   Level 0.5   Minutes 10   METs 2.47     NuStep   Level 4   Minutes 10   METs 2.7     Home Exercise Plan   Plans to continue exercise at Home (comment)   Frequency Add 2 additional days to program exercise sessions.   Initial Home Exercises Provided 09/24/16      Functional Capacity:     6 Minute Walk    Row Name 09/11/16 1346 01/15/17 1504       6 Minute Walk   Phase Initial Discharge    Distance 1400 feet 1654 feet    Distance % Change  - 18.14 %    Walk Time 6 minutes 6 minutes    # of Rest Breaks 0 0    MPH 2.65 3.13  METS 4.75 5.19    RPE 12 11    VO2 Peak 16.64  -    Symptoms No No    Resting HR 98 bpm 87 bpm    Resting BP 104/70 106/71    Max Ex. HR 112 bpm 121 bpm    Max Ex. BP 102/70 100/60    2 Minute Post BP 103/71 102/62       Psychological, QOL, Others - Outcomes: PHQ 2/9: Depression screen [facility] 2/9 12/26/2016 09/15/2016 03/26/2016 03/12/2016 10/09/2015  Decreased Interest 0 0 0 0 0  Down, Depressed, Hopeless 0 0 0 0 0  PHQ - 2 Score 0 0 0 0 0  Altered sleeping - - - - -  Tired, decreased energy - - - - -  Change in appetite - - - - -  Feeling bad or failure about yourself  - - - - -  Trouble concentrating - - - - -  Moving slowly or fidgety/restless - - - - -  Suicidal thoughts - - - - -  PHQ-9 Score - - - - -    Quality of Life:     Quality of Life - 01/15/17 1517      Quality of Life Scores   Health/Function Pre 11.89 %   Health/Function Post 22.81 %   Health/Function % Change 91.84 %   Socioeconomic Pre 22.31 %   Socioeconomic Post 25.07 %   Socioeconomic % Change  12.37 %   Psych/Spiritual Pre 23.57 %   Psych/Spiritual Post 23.14 %   Psych/Spiritual %  Change -1.82 %   Family Pre 27.3 %   Family Post 26.1 %   Family % Change -4.4 %   GLOBAL Pre 19.23 %   GLOBAL Post 23.89 %   GLOBAL % Change 24.23 %      Personal Goals: Goals established at orientation with interventions provided to work toward goal.     Personal Goals and Risk Factors at Admission - 09/11/16 0917      Core Components/Risk Factors/Patient Goals on Admission   Increase Strength and Stamina Yes   Intervention Provide advice, education, support and counseling about physical activity/exercise needs.;Develop an individualized exercise prescription for aerobic and resistive training based on initial evaluation findings, risk stratification, comorbidities and participant's personal goals.   Expected Outcomes Achievement of increased cardiorespiratory fitness and enhanced flexibility, muscular endurance and strength shown through measurements of functional capacity and personal statement of participant.   Hypertension Yes   Intervention Provide education on lifestyle modifcations including regular physical activity/exercise, weight management, moderate sodium restriction and increased consumption of fresh fruit, vegetables, and low fat dairy, alcohol moderation, and smoking cessation.;Monitor prescription use compliance.   Expected Outcomes Short Term: Continued assessment and intervention until BP is < 140/96m HG in hypertensive participants. < 130/816mHG in hypertensive participants with diabetes, heart failure or chronic kidney disease.;Long Term: Maintenance of blood pressure at goal levels.   Stress Yes   Intervention Offer individual and/or small group education and counseling on adjustment to heart disease, stress management and health-related lifestyle change. Teach and support self-help strategies.;Refer participants experiencing significant psychosocial distress to appropriate mental health specialists for further evaluation and treatment. When possible, include family  members and significant others in education/counseling sessions.   Expected Outcomes Short Term: Participant demonstrates changes in health-related behavior, relaxation and other stress management skills, ability to obtain effective social support, and compliance with psychotropic medications if prescribed.;Long Term: Emotional wellbeing is indicated by absence of clinically  significant psychosocial distress or social isolation.   Personal Goal Other Yes   Personal Goal increase stamina, be able to do more with family, decrease anxiety   Intervention individualized exercise prescription, education classes, relaxation and meditation classes   Expected Outcomes increase overall functional capacity and ability to be active with family       Personal Goals Discharge:     Goals and Risk Factor Review    Row Name 10/14/16 1610 11/13/16 1450 12/09/16 1443         Core Components/Risk Factors/Patient Goals Review   Personal Goals Review Increase Strength and Stamina  -  -     Review Pt is exercising at home. Pt walks on treadmill 2-3x week for 13 minutes, Discussed time progression and importance of warm up and cool down. Pt is now walking on TM for 12-15 minutes 1-2x/day. Pt states that some days are better than others.  Pt is now walking on TM for 29 minutes and mph/pace has also increased.     Expected Outcomes Pt will continue to show progression time/speed on treadmill. Pt will continue to show progression in exercise tolerance. Pt will continue to show progression time/speed on treadmill. Pt will continue to show progression in exercise tolerance. Pt will continue to show progression time/speed on treadmill. Pt will continue to show progression in exercise tolerance.        Nutrition & Weight - Outcomes:     Pre Biometrics - 09/11/16 1403      Pre Biometrics   Waist Circumference 30.75 inches   Hip Circumference 38.5 inches   Waist to Hip Ratio 0.8 %   Triceps Skinfold 34 mm   % Body  Fat 32.8 %   Grip Strength 30 kg   Flexibility 13 in   Single Leg Stand 30 seconds         Post Biometrics - 01/15/17 1509       Post  Biometrics   Height 5' 8"  (1.727 m)   Weight 140 lb 10.5 oz (63.8 kg)   Waist Circumference 30.5 inches   Hip Circumference 38.5 inches   Waist to Hip Ratio 0.79 %   BMI (Calculated) 21.4   Triceps Skinfold 30 mm   % Body Fat 32.4 %   Grip Strength 30 kg   Flexibility 17 in   Single Leg Stand 30 seconds      Nutrition:     Nutrition Therapy & Goals - 01/16/17 1224      Nutrition Therapy   Diet Therapeutic Lifestyle Changes     Personal Nutrition Goals   Nutrition Goal Maintain wt     Intervention Plan   Intervention Prescribe, educate and counsel regarding individualized specific dietary modifications aiming towards targeted core components such as weight, hypertension, lipid management, diabetes, heart failure and other comorbidities.   Expected Outcomes Short Term Goal: Understand basic principles of dietary content, such as calories, fat, sodium, cholesterol and nutrients.;Long Term Goal: Adherence to prescribed nutrition plan.      Nutrition Discharge:     Nutrition Assessments - 01/16/17 1224      MEDFICTS Scores   Pre Score 19   Post Score 21   Score Difference 2      Education Questionnaire Score:     Knowledge Questionnaire Score - 09/11/16 1346      Knowledge Questionnaire Score   Pre Score 24/24      Goals reviewed with patient Pt graduated from cardiac rehab program today with completion  of 36 exercise sessions in Phase II. Pt maintained good attendance and progressed nicely during her participation in rehab as evidenced by increased MET level.   Medication list reconciled. Repeat  PHQ score-0. Pt with a tremendous improvement with anxiety about her health and recovery from the surgery. Pt has made significant lifestyle changes and should be commended for her success. Pt feels she has achieved most goals  during cardiac rehab. Pt has not biked on trails with her family but feels with continued exercise post rehab she should be able to do so.  Pt did increase stamina and is able to ambulate 10,000 steps a day, pt has returned to driving.  Pt feels she is able to relax which has decreased her anxiety.  Pt feels hopeful about her future and feels she is able to be a good mother and wife. Pt plans to continue exercise at [facility] using the recumbent bike, treadmill and elliptical.  Pt plans to exercise at the gym 2 x week and when the weather becomes warmer add in walking on the other days.  It was a joy to watch this patient transform and feel more confident about herself.  Cherre Huger, BSN

## 2016-12-29 ENCOUNTER — Encounter (HOSPITAL_COMMUNITY): Admission: RE | Admit: 2016-12-29 | Payer: 59 | Source: Ambulatory Visit

## 2016-12-31 ENCOUNTER — Encounter (HOSPITAL_COMMUNITY): Payer: 59

## 2017-01-02 ENCOUNTER — Encounter (HOSPITAL_COMMUNITY): Payer: 59

## 2017-01-05 ENCOUNTER — Encounter (HOSPITAL_COMMUNITY): Payer: 59
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
## 2017-01-07 ENCOUNTER — Encounter (HOSPITAL_COMMUNITY): Payer: 59

## 2017-01-08 ENCOUNTER — Ambulatory Visit (INDEPENDENT_AMBULATORY_CARE_PROVIDER_SITE_OTHER): Payer: 59 | Admitting: Cardiology

## 2017-01-08 ENCOUNTER — Encounter: Payer: Self-pay | Admitting: Cardiology

## 2017-01-08 VITALS — BP 100/70 | HR 71 | Ht 68.0 in | Wt 142.0 lb

## 2017-01-08 DIAGNOSIS — D219 Benign neoplasm of connective and other soft tissue, unspecified: Secondary | ICD-10-CM | POA: Diagnosis not present

## 2017-01-08 DIAGNOSIS — I4892 Unspecified atrial flutter: Secondary | ICD-10-CM

## 2017-01-08 NOTE — Patient Instructions (Signed)
Your physician wants you to follow-up in: 6 MONTHS WITH DR CRENSHAW You will receive a reminder letter in the mail two months in advance. If you don't receive a letter, please call our office to schedule the follow-up appointment.   If you need a refill on your cardiac medications before your next appointment, please call your pharmacy.  

## 2017-01-09 ENCOUNTER — Encounter (HOSPITAL_COMMUNITY): Payer: 59

## 2017-01-12 ENCOUNTER — Encounter (HOSPITAL_COMMUNITY): Payer: 59

## 2017-01-22 ENCOUNTER — Encounter: Payer: Self-pay | Admitting: Family Medicine

## 2017-01-22 ENCOUNTER — Ambulatory Visit (INDEPENDENT_AMBULATORY_CARE_PROVIDER_SITE_OTHER): Payer: 59 | Admitting: Family Medicine

## 2017-01-22 VITALS — BP 92/64 | HR 86 | Temp 98.4°F | Ht 68.0 in | Wt 141.1 lb

## 2017-01-22 DIAGNOSIS — Z9109 Other allergy status, other than to drugs and biological substances: Secondary | ICD-10-CM

## 2017-01-22 DIAGNOSIS — I519 Heart disease, unspecified: Secondary | ICD-10-CM

## 2017-01-22 DIAGNOSIS — E876 Hypokalemia: Secondary | ICD-10-CM

## 2017-01-22 DIAGNOSIS — I5189 Other ill-defined heart diseases: Secondary | ICD-10-CM

## 2017-01-22 DIAGNOSIS — Z8679 Personal history of other diseases of the circulatory system: Secondary | ICD-10-CM

## 2017-01-22 LAB — BASIC METABOLIC PANEL
BUN: 15 mg/dL (ref 6–23)
CALCIUM: 9.7 mg/dL (ref 8.4–10.5)
CHLORIDE: 102 meq/L (ref 96–112)
CO2: 28 meq/L (ref 19–32)
Creatinine, Ser: 0.69 mg/dL (ref 0.40–1.20)
GFR: 97.49 mL/min (ref >60.00)
Glucose, Bld: 81 mg/dL (ref 70–99)
Potassium: 4 mEq/L (ref 3.5–5.1)
SODIUM: 138 meq/L (ref 135–145)

## 2017-01-22 MED ORDER — FLUTICASONE PROPIONATE 50 MCG/ACT NA SUSP: 2.0000 | Freq: Every day | NASAL | 6 refills | Status: DC

## 2017-01-22 NOTE — Progress Notes (Signed)
Pre visit review using our clinic review tool, if applicable. No additional management support is needed unless otherwise documented below in the visit note. 

## 2017-01-22 NOTE — Progress Notes (Signed)
HPI:  Emily Joseph is a very pleasant 46 yo here for an acute visit for several issues. She has seasonal allergies and feels better when takes rx  flonase rather then otc options and requests rx. She also wants to check labs for potassium. She was in the hospital a few months ago for her cardiac issues and had low potassium and reports her cardiologist did not recheck but that her [employer] told her she should recheck. She also has many questions about her metoprolol. HAs had some mild dyspnea since her heart surgery last year - it feels like a constant mild tightness in the chest. She wonders if stopping the BB in the hospital for low blood pressure caused her atrial flutter and if the mild dyspnea is related to the surgery or the medicine or something else. She did not feel good about the interaction she had with cardiologist she saw recently. Does have good relationship with her CTS but has not asked for his thoughts on these matters. No CP, recent palpitations, worsening of the dyspnea - reports it is better then it was before surgery, fevers, cough, wheezing.  ROS: See pertinent positives and negatives per HPI.  Past Medical History:  Diagnosis Date  . Adenomyosis    not papanicolaou smear of cervix and cervical HPV  . Anxiety 06/08/2016   -about her health and about taking medications  . Arthritis    ?  Marland Kitchen Asthma   . 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 Chronic fatigue 06/08/2016   -eval with rheum x2, neurology, gastroenterology  . Chronic pain 06/08/2016   -all over her whole life; joints, muscles, head -numerous evaluations, Dr. Trudie Reed in 2017, [place] rheum -seeing Dr. Jaynee Eagles, Neurologist for back pain with radicular symptoms  . Complication of anesthesia    "with epidural bp bottoms out"  . Dysrhythmia    fast hr  . Eosinophilic esophagitis    Sees Dr. Carlean Purl  . Fibromyalgia   . GERD (gastroesophageal reflux disease)   . Heart murmur   . History of  pneumonia    when pt was pregnant  . Iron deficiency anemia due to chronic blood loss    Menorrhagia, s/p complete hysterectomy, ovaries remain hx  . Irritable bowel syndrome 01/27/2013   Dr Carlean Purl 02/2016   . Microhematuria   . Mouth problem    failed gum graft 1/16  . Nasal airway abnormality   . Nasal obstruction 06/26/2015   Seen by ENT 06-26-15.  May need sleep study to see if having apnea    . Palpitations   . 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 Shortness of breath dyspnea   . SUI (stress urinary incontinence, female)   . Syncope    with last child with epideral  . UTI (urinary tract infection) 07/14/2016   >=100,000 COLONIES/mL KLEBSIELLA PNEUMONIA    Past Surgical History:  Procedure Laterality Date  . APPENDECTOMY    . BALLOON DILATION N/A 02/09/2013   Procedure: BALLOON DILATION;  Surgeon: Arta Silence, MD;  Location: [facility];  Service: Endoscopy;  Laterality: N/A;  . BILATERAL SALPINGECTOMY N/A 06/14/2014   Procedure: BILATERAL SALPINGECTOMY;  Surgeon: Allena Katz, MD;  Location: [facility];  Service: Gynecology;  Laterality: N/A;  . COLONOSCOPY WITH PROPOFOL N/A 02/09/2013   Procedure: COLONOSCOPY WITH PROPOFOL;  Surgeon: Gwyndolyn Saxon  Paulita Fujita, MD;  [facility]: [facility];  Service: Endoscopy;  Laterality: N/A;  need ultra thin colon scope  . ESOPHAGOGASTRODUODENOSCOPY (EGD) WITH PROPOFOL N/A 02/09/2013   Procedure: ESOPHAGOGASTRODUODENOSCOPY (EGD) WITH PROPOFOL;  Surgeon: Arta Silence, MD;  [facility];  Service: Endoscopy;  Laterality: N/A;  . LAPAROSCOPIC ASSISTED VAGINAL HYSTERECTOMY Right 06/14/2014   Procedure: OPEN LAPAROSCOPIC ASSISTED VAGINAL HYSTERECTOMY;  Surgeon: Allena Katz, MD;  Location: [facility];  Service: Gynecology;  Laterality: Right;  . LYSIS OF ADHESION N/A 06/14/2014   Procedure: LYSIS OF ADHESION;  Surgeon: Allena Katz, MD;  Location: [facility];  Service:  Gynecology;  Laterality: N/A;  . MINIMALLY INVASIVE EXCISION OF ATRIAL MYXOMA Right 07/11/2016   Procedure: MINIMALLY INVASIVE RESECTION OF RIGHT ATRIAL LIPOMA WITH CLOSURE OF PATENT FORAMEN OVALE;  Surgeon: Rexene Alberts, MD;  Location: [facility];  Service: Open Heart Surgery;  Laterality: Right;  . OVARIAN CYST REMOVAL Right 1990  . TEE WITHOUT CARDIOVERSION N/A 07/11/2016   Procedure: TRANSESOPHAGEAL ECHOCARDIOGRAM (TEE);  Surgeon: Rexene Alberts, MD;  Location: [facility];  Service: Open Heart Surgery;  Laterality: N/A;    Family History  Problem Relation Age of Onset  . Cancer Mother   . CAD Father     MI at age 62  . Stroke Father   . Cancer Maternal Grandfather   . Stroke Paternal Grandfather   . Kidney disease Paternal Grandfather     Social History   Social History  . Marital status: Married    Spouse name: [name]   . Number of children: 4  . Years of education: 12+   Social History Main Topics  . Smoking status: Never Smoker  . Smokeless tobacco: Never Used  . Alcohol use 0.0 oz/week     Comment: 3 glasses wine per week non recently  . Drug use: No  . Sexual activity: Not Asked   Other Topics Concern  . None   Social History Narrative   Social History:   Updated: 04/2016   Now stays at home -  feels can barely function just to keep house and take care of  4 children. Husband travels and works a lot.   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      [name] - husband; [phone] -daughter; [name]- son [name] 2003 daughter [name] 2008 daughter    Healthy diet - lots of food allergies. Avoiding gluten currently.      Of note, at age 77 she had a very traumatic medical experience. She apparently was in the hospital for some time and had many needlesticks, many CT scans and surgery for an ovarian mass. This was very traumatizing for her. She still gets anxiety when she is going to see a  doctor or health care provider. She had a flashback to this time when she went for acupuncture.     Current Outpatient Prescriptions:  .  acetaminophen (TYLENOL) 500 MG tablet, Take 500 mg by mouth daily as needed (pain)., Disp: , Rfl:  .  albuterol (PROVENTIL HFA;VENTOLIN HFA) 108 (90 Base) MCG/ACT inhaler, Inhale 2 puffs into the lungs every 6 (six) hours as needed (asthmatic bronchitis)., Disp: , Rfl:  .  aspirin EC 81 MG EC tablet, Take 1 tablet (81 mg total) by mouth daily., Disp: , Rfl:  .  EPINEPHrine (EPIPEN 2-PAK) 0.3 mg/0.3 mL IJ SOAJ injection, Inject 0.3 mg into the  muscle once as needed (severe allergic reaction)., Disp: , Rfl:  .  fluticasone (FLONASE) 50 MCG/ACT nasal spray, Place 2 sprays into both nostrils daily., Disp: 16 g, Rfl: 6 .  metoprolol tartrate (LOPRESSOR) 12.5 mg TABS tablet, Take 12.5 mg by mouth 2 (two) times daily., Disp: , Rfl:  .  Multiple Vitamin (MULTIVITAMIN WITH MINERALS) TABS tablet, Take 1 tablet by mouth daily., Disp: , Rfl:  .  Polyethyl Glycol-Propyl Glycol (SYSTANE OP), Place 1 drop into both eyes at bedtime., Disp: , Rfl:   EXAM:  Vitals:   01/22/17 1307  BP: 92/64  Pulse: 86  Temp: 98.4 F (36.9 C)    Body mass index is 21.45 kg/m.  GENERAL: vitals reviewed and listed above, alert, oriented, appears well hydrated and in no acute distress  HEENT: atraumatic, conjunttiva clear, no obvious abnormalities on inspection of external nose and ears  NECK: no obvious masses on inspection  LUNGS: clear to auscultation bilaterally, no wheezes, rales or rhonchi, good air movement  CV: HRRR, no peripheral edema  MS: moves all extremities without noticeable abnormality  PSYCH: pleasant and cooperative, no obvious depression or anxiety  ASSESSMENT AND PLAN:  Discussed the following assessment and plan:  Hypokalemia - Plan: Basic metabolic panel  Multiple environmental allergies - Plan: fluticasone (FLONASE) 50 MCG/ACT nasal spray  s/p  minimally invasive resection of right atrial lipoma  Hx of atrial flutter  -sent rx for flonase -will check BMP -in terms of dyspnea and BB suggested perhaps she could consult her CT surgeon as she has an unusual hx, consider pulm evaluation if not felt related to prior cardiac hx - she agrees -follow up as needed -Patient advised to return or notify a doctor immediately if symptoms worsen or persist or new concerns arise.  Patient Instructions  BEFORE YOU LEAVE: -follow up: yearly for physical -lab  I will sent refill on the flonase.  Please talk with your heart specialist and let me know if you need [place] to place a referral or can help further.  We have ordered labs or studies at this visit. It can take up to 1-2 weeks for results and processing. IF results require follow up or explanation, we will call you with instructions. Clinically stable results will be released to your [facility]. If you have not heard from [place] or cannot find your results in [facility] in 2 weeks please contact our office at [phone].  If you are not yet signed up for [facility], please consider signing up.           Colin Benton R., DO

## 2017-01-22 NOTE — Patient Instructions (Signed)
BEFORE YOU LEAVE: -follow up: yearly for physical -lab  I will sent refill on the flonase.  Please talk with your heart specialist and let me know if you need [place] to place a referral or can help further.  We have ordered labs or studies at this visit. It can take up to 1-2 weeks for results and processing. IF results require follow up or explanation, we will call you with instructions. Clinically stable results will be released to your [facility]. If you have not heard from [place] or cannot find your results in [facility] in 2 weeks please contact our office at [phone].  If you are not yet signed up for [facility], please consider signing up.

## 2017-01-23 ENCOUNTER — Encounter (HOSPITAL_COMMUNITY): Payer: Self-pay | Admitting: *Deleted

## 2017-02-11 ENCOUNTER — Telehealth: Payer: Self-pay | Admitting: Family Medicine

## 2017-02-11 NOTE — Telephone Encounter (Signed)
° ° ° °  Pt is requesting a call back about labs she had last week

## 2017-02-11 NOTE — Telephone Encounter (Signed)
Left a message for a return call.

## 2017-02-12 NOTE — Telephone Encounter (Signed)
I called the pt and left a detailed message with the results and advised her this was sent as a Mychart message on 3/15 by Dr Maudie Mercury to her as well.

## 2017-02-16 ENCOUNTER — Telehealth: Payer: Self-pay | Admitting: Cardiology

## 2017-02-16 ENCOUNTER — Telehealth: Payer: Self-pay | Admitting: Internal Medicine

## 2017-02-16 NOTE — Telephone Encounter (Signed)
New Message  Pt voiced have IBS (constipation) and pt feels miserable and have take stool softener and miralax.  Pt needs assistance on what else she needs to do or take.

## 2017-02-16 NOTE — Telephone Encounter (Signed)
Pt seeking recommendations for "frozen gut". Taking miralax and stool softeners. She is asking about recommendations for gut massage, enemas, etc. I have advised pt to call GI Dr. Celesta Aver office, for whom she is a patient. Optionally, she may also discuss w PCP.  She does have concern of potential electrolyte disturbances, HR or BP changes if starting new meds. This understood, I advised to call us if they need cardiology review on Rx. She expressed agreement w this plan & thanks for call.  routed as [employer]

## 2017-02-16 NOTE — Telephone Encounter (Signed)
Called complaining of constipation. Called office earlier today with same and was waiting on a call back with recommendations. Chart reviewed. I told her that she could continue to increase miralax or take an enema (she is familiar with both). Will forward note to Dr. Carlean Purl and his nurse

## 2017-02-16 NOTE — Telephone Encounter (Signed)
Patient reports that she is having terrible constipation 1 week since a decent BM. She took two docusate sodium yesterday She has been using Miralax 2-3 times a day with very little results.  She had a very small thin soft  stool this am, but is uncomfortable because .  She is well hydrated. She had open heart surgery in September and is on lopressor for arrhythmias after surgery. .  She is concerned about what she can take after her surgery. Cardiology instructed to call here.  She has been exercisingt. Please advise what she can take.

## 2017-02-17 NOTE — Telephone Encounter (Signed)
I spoke to her and advised Senokot 2-4 followed by MiraLAx in purge fashion She did not try enema as recommended by Dr. Henrene Pastor She said will try 2 Senokot and some Miralax and call back prn

## 2017-06-16 ENCOUNTER — Encounter: Payer: 59 | Admitting: Family Medicine

## 2017-06-23 ENCOUNTER — Telehealth: Payer: Self-pay | Admitting: Family Medicine

## 2017-06-23 ENCOUNTER — Encounter: Payer: Self-pay | Admitting: Family Medicine

## 2017-06-23 ENCOUNTER — Ambulatory Visit (INDEPENDENT_AMBULATORY_CARE_PROVIDER_SITE_OTHER): Payer: 59 | Admitting: Family Medicine

## 2017-06-23 VITALS — BP 84/60 | HR 85 | Temp 98.0°F | Ht 68.0 in | Wt 146.0 lb

## 2017-06-23 DIAGNOSIS — R300 Dysuria: Secondary | ICD-10-CM

## 2017-06-23 LAB — POC URINALSYSI DIPSTICK (AUTOMATED)
Bilirubin, UA: NEGATIVE
Glucose, UA: NEGATIVE
Ketones, UA: NEGATIVE
NITRITE UA: NEGATIVE
PH UA: 6 (ref 5.0–8.0)
PROTEIN UA: 15
Spec Grav, UA: 1.015 (ref 1.010–1.025)
UROBILINOGEN UA: 0.2 U/dL

## 2017-06-23 MED ORDER — CEPHALEXIN 500 MG PO CAPS: 500.0000 mg | ORAL_CAPSULE | Freq: Three times a day (TID) | ORAL | 0 refills | Status: DC

## 2017-06-23 NOTE — Addendum Note (Signed)
Addended by: Agnes Lawrence on: 06/23/2017 11:26 AM   Modules accepted: Orders

## 2017-06-23 NOTE — Telephone Encounter (Signed)
Pt said she is not allergic to augmentin 500 mg bid. That med she took after her surgery

## 2017-06-23 NOTE — Progress Notes (Signed)
HPI:  Acute visit for Dysuria: -started about 1 week ago -symptoms include: dysuria, frequency, urgency, intermittent mild hematuria since yesterday -denies fevers, NV, abd/flank/pelvic pain, vaginal discharge, dizziness, prezyncope, orthostatic symptoms -hx urinary tract stricture, chronic hematuria since childhood, UTI - has urologist  ROS: See pertinent positives and negatives per HPI.  Past Medical History:  Diagnosis Date  . Adenomyosis    not papanicolaou smear of cervix and cervical HPV  . Anxiety 06/08/2016   -about her health and about taking medications  . Arthritis    ?  Marland Kitchen Asthma   . 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 Chronic fatigue 06/08/2016   -eval with rheum x2, neurology, gastroenterology  . Chronic pain 06/08/2016   -all over her whole life; joints, muscles, head -numerous evaluations, Dr. Trudie Reed in 2017, [place] rhe[facility] -seeing Dr. Jaynee Eagles, Neurologist for back pain with radicular symptoms  . Complication of anesthesia    "with epidural bp bottoms out"  . Dysrhythmia    fast hr  . Eosinophilic esophagitis    Sees Dr. Carlean Purl  . Fibromyalgia   . GERD (gastroesophageal reflux disease)   . Heart murmur   . History of pneumonia    when pt was pregnant  . Iron deficiency anemia due to chronic blood loss    Menorrhagia, s/p complete hysterectomy, ovaries remain hx  . Irritable bowel syndrome 01/27/2013   Dr Carlean Purl 02/2016   . Microhematuria   . Mouth problem    failed gum graft 1/16  . Nasal airway abnormality   . Nasal obstruction 06/26/2015   Seen by ENT 06-26-15.  May need sleep study to see if having apnea    . Palpitations   . 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 Shortness of breath dyspnea   . SUI (stress urinary incontinence, female)   . Syncope    with last child with epideral  . UTI (urinary tract infection) 07/14/2016   >=100,000 COLONIES/mL  KLEBSIELLA PNEUMONIA    Past Surgical History:  Procedure Laterality Date  . APPENDECTOMY    . BALLOON DILATION N/A 02/09/2013   Procedure: BALLOON DILATION;  Surgeon: Arta Silence, MD;  [facility];  Service: Endoscopy;  Laterality: N/A;  . BILATERAL SALPINGECTOMY N/A 06/14/2014   Procedure: BILATERAL SALPINGECTOMY;  Surgeon: Allena Katz, MD;  Location: [facility];  Service: Gynecology;  Laterality: N/A;  . COLONOSCOPY WITH PROPOFOL N/A 02/09/2013   Procedure: COLONOSCOPY WITH PROPOFOL;  Surgeon: Arta Silence, MD;  [facility];  Service: Endoscopy;  Laterality: N/A;  need ultra thin colon scope  . ESOPHAGOGASTRODUODENOSCOPY (EGD) WITH PROPOFOL N/A 02/09/2013   Procedure: ESOPHAGOGASTRODUODENOSCOPY (EGD) WITH PROPOFOL;  Surgeon: Arta Silence, MD;  [facility];  Service: Endoscopy;  Laterality: N/A;  . LAPAROSCOPIC ASSISTED VAGINAL HYSTERECTOMY Right 06/14/2014   Procedure: OPEN LAPAROSCOPIC ASSISTED VAGINAL HYSTERECTOMY;  Surgeon: Allena Katz, MD;  Location: [facility];  Service: Gynecology;  Laterality: Right;  . LYSIS OF ADHESION N/A 06/14/2014   Procedure: LYSIS OF ADHESION;  Surgeon: Allena Katz, MD;  Location: [facility];  Service: Gynecology;  Laterality: N/A;  . MINIMALLY INVASIVE EXCISION OF ATRIAL MYXOMA Right 07/11/2016   Procedure: MINIMALLY INVASIVE RESECTION OF RIGHT ATRIAL LIPOMA WITH CLOSURE OF PATENT FORAMEN OVALE;  Surgeon: Rexene Alberts, MD;  Location: [facility];  Service: Open Heart Surgery;  Laterality: Right;  . OVARIAN CYST REMOVAL Right 1990  . TEE WITHOUT CARDIOVERSION N/A 07/11/2016   Procedure: TRANSESOPHAGEAL ECHOCARDIOGRAM (TEE);  Surgeon: Rexene Alberts, MD;  Location: [facility];  Service: Open Heart Surgery;  Laterality: N/A;    Family History  Problem Relation Age of Onset  . Cancer Mother   . CAD Father        MI at age 4  . Stroke Father   . Cancer Maternal Grandfather   . Stroke Paternal Grandfather   . Kidney disease  Paternal Grandfather     Social History   Social History  . Marital status: Married    Spouse name: [name]   . Number of children: 4  . Years of education: 12+   Social History Main Topics  . Smoking status: Never Smoker  . Smokeless tobacco: Never Used  . Alcohol use 0.0 oz/week     Comment: 3 glasses wine per week non recently  . Drug use: No  . Sexual activity: Not Asked   Other Topics Concern  . None   Social History Narrative   Social History:   Updated: 04/2016   Now stays at home -  feels can barely function just to keep house and take care of  4 children. Husband travels and works a lot.   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      [name] - husband; [phone] -daughter; [name]- son [name] 2003 daughter [name] 2008 daughter    Healthy diet - lots of food allergies. Avoiding gluten currently.      Of note, at age 34 she had a very traumatic medical experience. She apparently was in the hospital for some time and had many needlesticks, many CT scans and surgery for an ovarian mass. This was very traumatizing for her. She still gets anxiety when she is going to see a doctor or health care provider. She had a flashback to this time when she went for acupuncture.     Current Outpatient Prescriptions:  .  acetaminophen (TYLENOL) 500 MG tablet, Take 500 mg by mouth daily as needed (pain)., Disp: , Rfl:  .  albuterol (PROVENTIL HFA;VENTOLIN HFA) 108 (90 Base) MCG/ACT inhaler, Inhale 2 puffs into the lungs every 6 (six) hours as needed (asthmatic bronchitis)., Disp: , Rfl:  .  aspirin EC 81 MG EC tablet, Take 1 tablet (81 mg total) by mouth daily., Disp: , Rfl:  .  EPINEPHrine (EPIPEN 2-PAK) 0.3 mg/0.3 mL IJ SOAJ injection, Inject 0.3 mg into the muscle once as needed (severe allergic reaction)., Disp: , Rfl:  .  fluticasone (FLONASE) 50 MCG/ACT nasal spray, Place 2 sprays into both  nostrils daily., Disp: 16 g, Rfl: 6 .  metoprolol tartrate (LOPRESSOR) 12.5 mg TABS tablet, Take 12.5 mg by mouth 2 (two) times daily., Disp: , Rfl:  .  Multiple Vitamin (MULTIVITAMIN WITH MINERALS) TABS tablet, Take 1 tablet by mouth daily., Disp: , Rfl:  .  Polyethyl Glycol-Propyl Glycol (SYSTANE OP), Place 1 drop into both eyes at bedtime., Disp: , Rfl:  .  cephALEXin (KEFLEX) 500 MG capsule, Take 1 capsule (500 mg total) by mouth 3 (three) times daily., Disp: 15 capsule, Rfl: 0  EXAM:  Vitals:   06/23/17 0952  BP: (!) 84/60  Pulse: 85  Temp: 98 F (36.7 C)    Body mass index is 22.2 kg/m.  GENERAL: vitals reviewed and  listed above, alert, oriented, appears well hydrated and in no acute distress  HEENT: atraumatic, conjunttiva clear, no obvious abnormalities on inspection of external nose and ears  NECK: no obvious masses on inspection  LUNGS: clear to auscultation bilaterally, no wheezes, rales or rhonchi, good air movement  CV: HRRR, no peripheral edema  ABD: BS+, soft, NTTP, no CVA TTP  MS: moves all extremities without noticeable abnormality  PSYCH: pleasant and cooperative, no obvious depression or anxiety  ASSESSMENT AND PLAN:  Discussed the following assessment and plan:  Dysuria - Plan: POCT Urinalysis Dipstick (Automated)  -udip + symptoms c/w likely UTI -she has a number of abx allergies but feels can due keflex - rx sent -culture pending -Patient advised to return or notify a doctor immediately if symptoms worsen or persist or new concerns arise. No AVS. There are no Patient Instructions on file for this visit.  Colin Benton R., DO

## 2017-06-24 LAB — URINE CULTURE

## 2017-07-06 LAB — HM PAP SMEAR

## 2017-07-08 ENCOUNTER — Telehealth: Payer: Self-pay | Admitting: Internal Medicine

## 2017-07-08 NOTE — Telephone Encounter (Signed)
Patient c/o constipation and is asking if it is ok to take Colace.  She is advised ok to take stool softeners and call back if she does not have any success.

## 2017-07-20 ENCOUNTER — Encounter: Payer: Self-pay | Admitting: Thoracic Surgery (Cardiothoracic Vascular Surgery)

## 2017-07-20 ENCOUNTER — Ambulatory Visit (INDEPENDENT_AMBULATORY_CARE_PROVIDER_SITE_OTHER): Payer: 59 | Admitting: Thoracic Surgery (Cardiothoracic Vascular Surgery)

## 2017-07-20 VITALS — BP 110/78 | HR 84 | Resp 16 | Ht 68.0 in | Wt 146.0 lb

## 2017-07-20 DIAGNOSIS — I519 Heart disease, unspecified: Secondary | ICD-10-CM

## 2017-07-20 DIAGNOSIS — I5189 Other ill-defined heart diseases: Secondary | ICD-10-CM

## 2017-07-20 DIAGNOSIS — Z09 Encounter for follow-up examination after completed treatment for conditions other than malignant neoplasm: Secondary | ICD-10-CM | POA: Diagnosis not present

## 2017-07-20 NOTE — Progress Notes (Signed)
[address]       [place] [address]             [phone]     CARDIOTHORACIC SURGERY OFFICE NOTE  Referring Provider is Lelon Perla, MD PCP is Lucretia Kern, DO   HPI:  Patient is a 46 year old female who returns to the office today for routine follow-up status post minimally invasive resection of large right atrial lipoma with closure of patent foramen ovale on 07/11/2016. Her early postoperative recovery was uneventful and she was last seen here in our office on 09/15/2016. Shortly after that she was taken off of her beta blocker because of borderline low blood pressure, and within 2 weeks she was in the emergency department with an episode of atrial flutter with rapid ventricular response. She was placed back on metoprolol and has done well since then. She was last seen in follow-up by Dr. Stanford Breed on 01/08/2017. She returns for office today and reports that she is doing exceptionally well. She states that in comparison with prior to surgery she feels like a person. She has minimal symptoms of exertional shortness of breath that occur only when she pushes herself while on a treadmill. Her exercise capacity is dramatically better than it was prior to surgery, and in retrospect she thinks that the mass muscle been there for at least 10 years or more. She does report that she still has occasional episodes of tachypalpitations. When this occurs she measures her heart rate and it is oftentimes 150 bpm. This has not been associated with any significant chest pain or syncope.  She otherwise feels quite well and has no complaints.   Current Outpatient Prescriptions  Medication Sig Dispense Refill  . acetaminophen (TYLENOL) 500 MG tablet Take 500 mg by mouth daily as needed (pain).    Marland Kitchen albuterol (PROVENTIL HFA;VENTOLIN HFA) 108 (90 Base) MCG/ACT inhaler Inhale 2 puffs into the lungs every 6 (six) hours as needed (asthmatic bronchitis).    Marland Kitchen aspirin EC 81 MG EC  tablet Take 1 tablet (81 mg total) by mouth daily.    Marland Kitchen EPINEPHrine (EPIPEN 2-PAK) 0.3 mg/0.3 mL IJ SOAJ injection Inject 0.3 mg into the muscle once as needed (severe allergic reaction).    . fluticasone (FLONASE) 50 MCG/ACT nasal spray Place 2 sprays into both nostrils daily. 16 g 6  . metoprolol tartrate (LOPRESSOR) 12.5 mg TABS tablet Take 12.5 mg by mouth 2 (two) times daily.    . Multiple Vitamin (MULTIVITAMIN WITH MINERALS) TABS tablet Take 1 tablet by mouth daily.    Vladimir Faster Glycol-Propyl Glycol (SYSTANE OP) Place 1 drop into both eyes at bedtime.     No current facility-administered medications for this visit.       Physical Exam:   BP 110/78 (BP Location: Left Arm, Patient Position: Sitting, Cuff Size: Normal)   Pulse 84   Resp 16   Ht 5\' 8"  (1.727 m)   Wt 146 lb (66.2 kg)   LMP 05/20/2014   SpO2 98% Comment: ON RA  BMI 22.20 kg/m   General:  Well-appearing  Chest:   Clear to auscultation  CV:   Regular rate and rhythm without murmur  Incisions:  Completely healed  Abdomen:  Soft nontender  Extremities:  Warm and well-perfused  Diagnostic Tests:  n/a   Impression:  Patient is doing well approximately one year status post resection of large right atrial mass which was partially obstructing the tricuspid valve with final  pathology consistent with benign lipoma. She does report occasional tachypalpitations suggested that she may continue have paroxysmal atrial flutter or SVT.  Plan:  We have not recommended any changes to the patient's current medications. I've encouraged the patient to follow-up with Dr. Stanford Breed to consider whether not her medication should be adjusted and/or she should undergo further diagnostic testing and/or ablation for possible recurrent tachycardia arrhythmias. In the future she will call and return to see [place] as needed. All of her questions have been addressed.  I spent in excess of 15 minutes during the conduct of this office consultation  and >50% of this time involved direct face-to-face encounter with the patient for counseling and/or coordination of their care.    Valentina Gu. Roxy Manns, MD 07/20/2017 10:47 AM

## 2017-07-20 NOTE — Patient Instructions (Signed)
Continue all previous medications without any changes at this time  

## 2017-07-30 ENCOUNTER — Encounter: Payer: Self-pay | Admitting: Family Medicine

## 2017-08-04 IMAGING — CR DG CHEST 1V PORT
1 series · 1 of 1 positions shown · non-contrast
Comparison: 07/11/2016

CLINICAL DATA: Status post surgical resection of a right atrial
lipoma.

EXAM:
PORTABLE CHEST 1 VIEW

[AP]
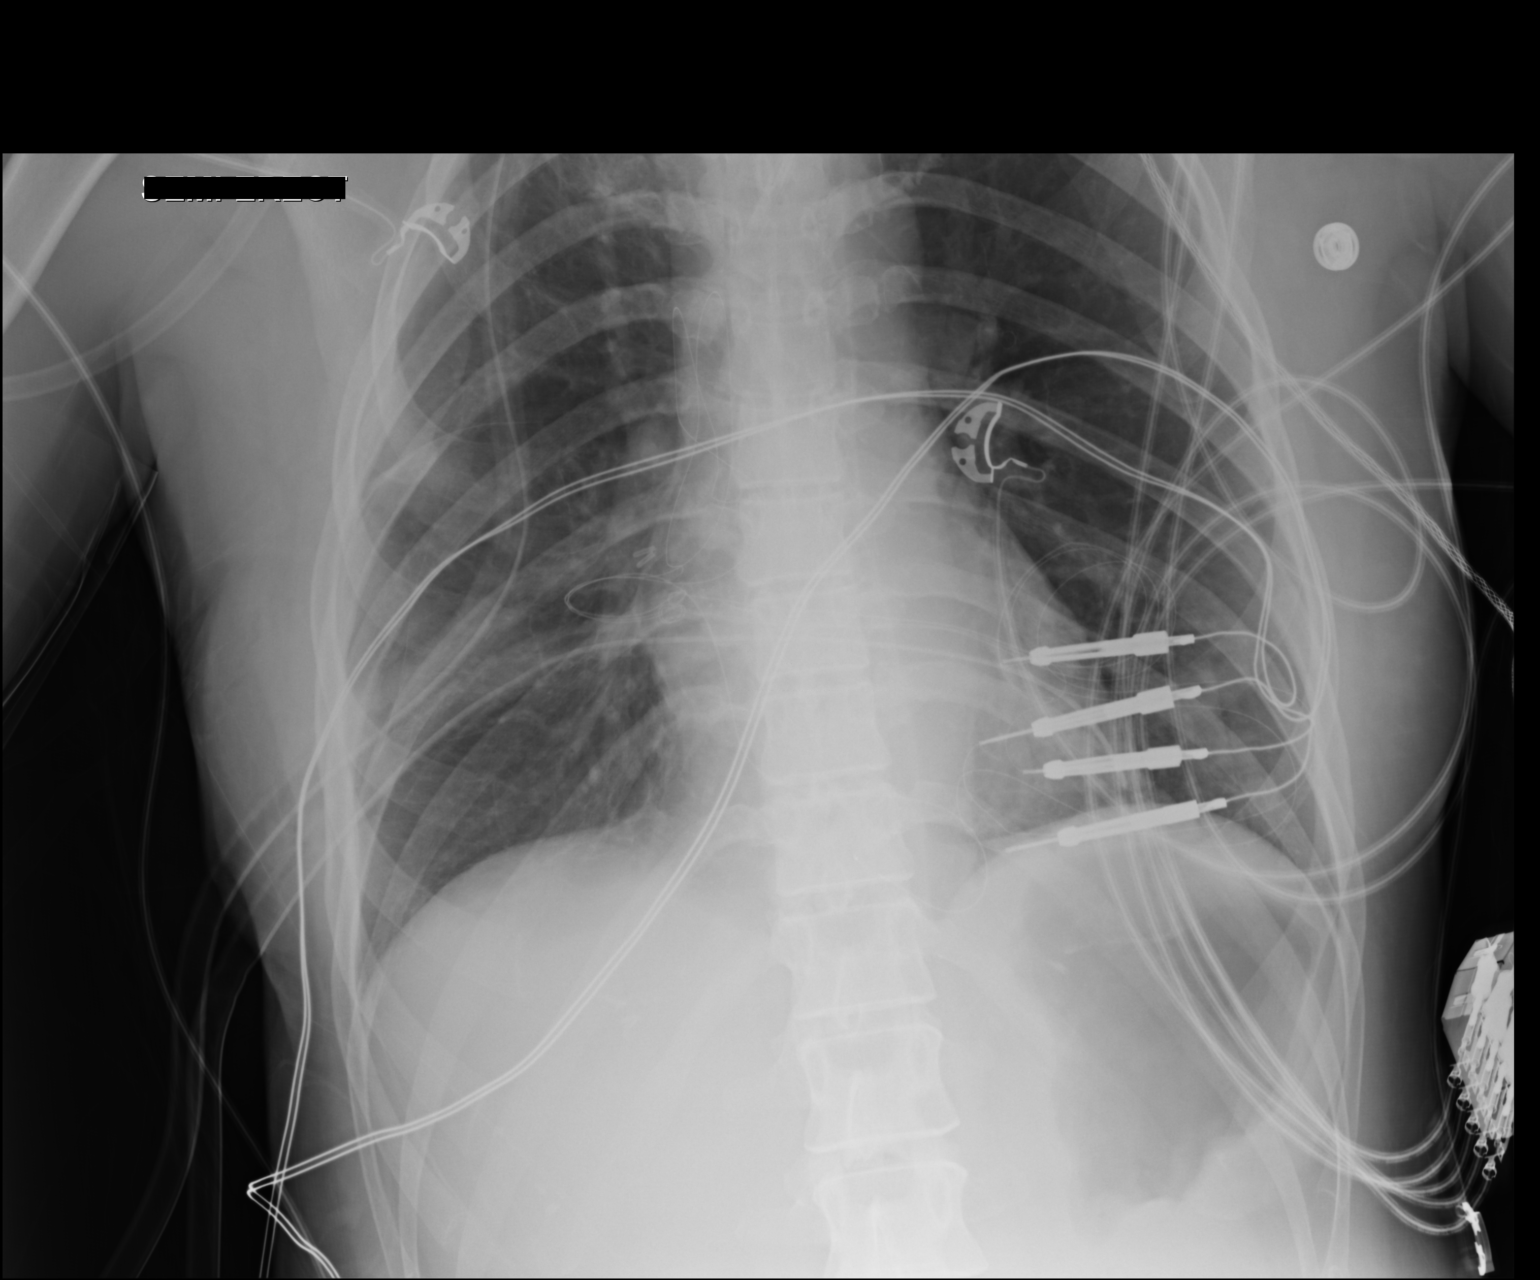

[1 of 1 positions shown; findings below may reference images not displayed]

FINDINGS: The endotracheal tube has been removed. The right-sided chest tube
is stable. No definite pneumothorax. The NG tube has been removed.
Stable left IJ catheter. Stable epicardial pacer wires. Heart is
normal in size. The lungs are clear except for minimal areas of
atelectasis.
IMPRESSION: Interval removal of the endotracheal tube and NG tube. Stable
right-sided chest. No definite pneumothorax.

Minimal streaky areas of atelectasis but no infiltrates edema or
effusions.

## 2017-08-05 IMAGING — CR DG CHEST 1V PORT
1 series · 1 of 1 positions shown · non-contrast
Comparison: July 12, 2016

CLINICAL DATA: Chest tube in place

EXAM:
PORTABLE CHEST 1 VIEW

[AP]
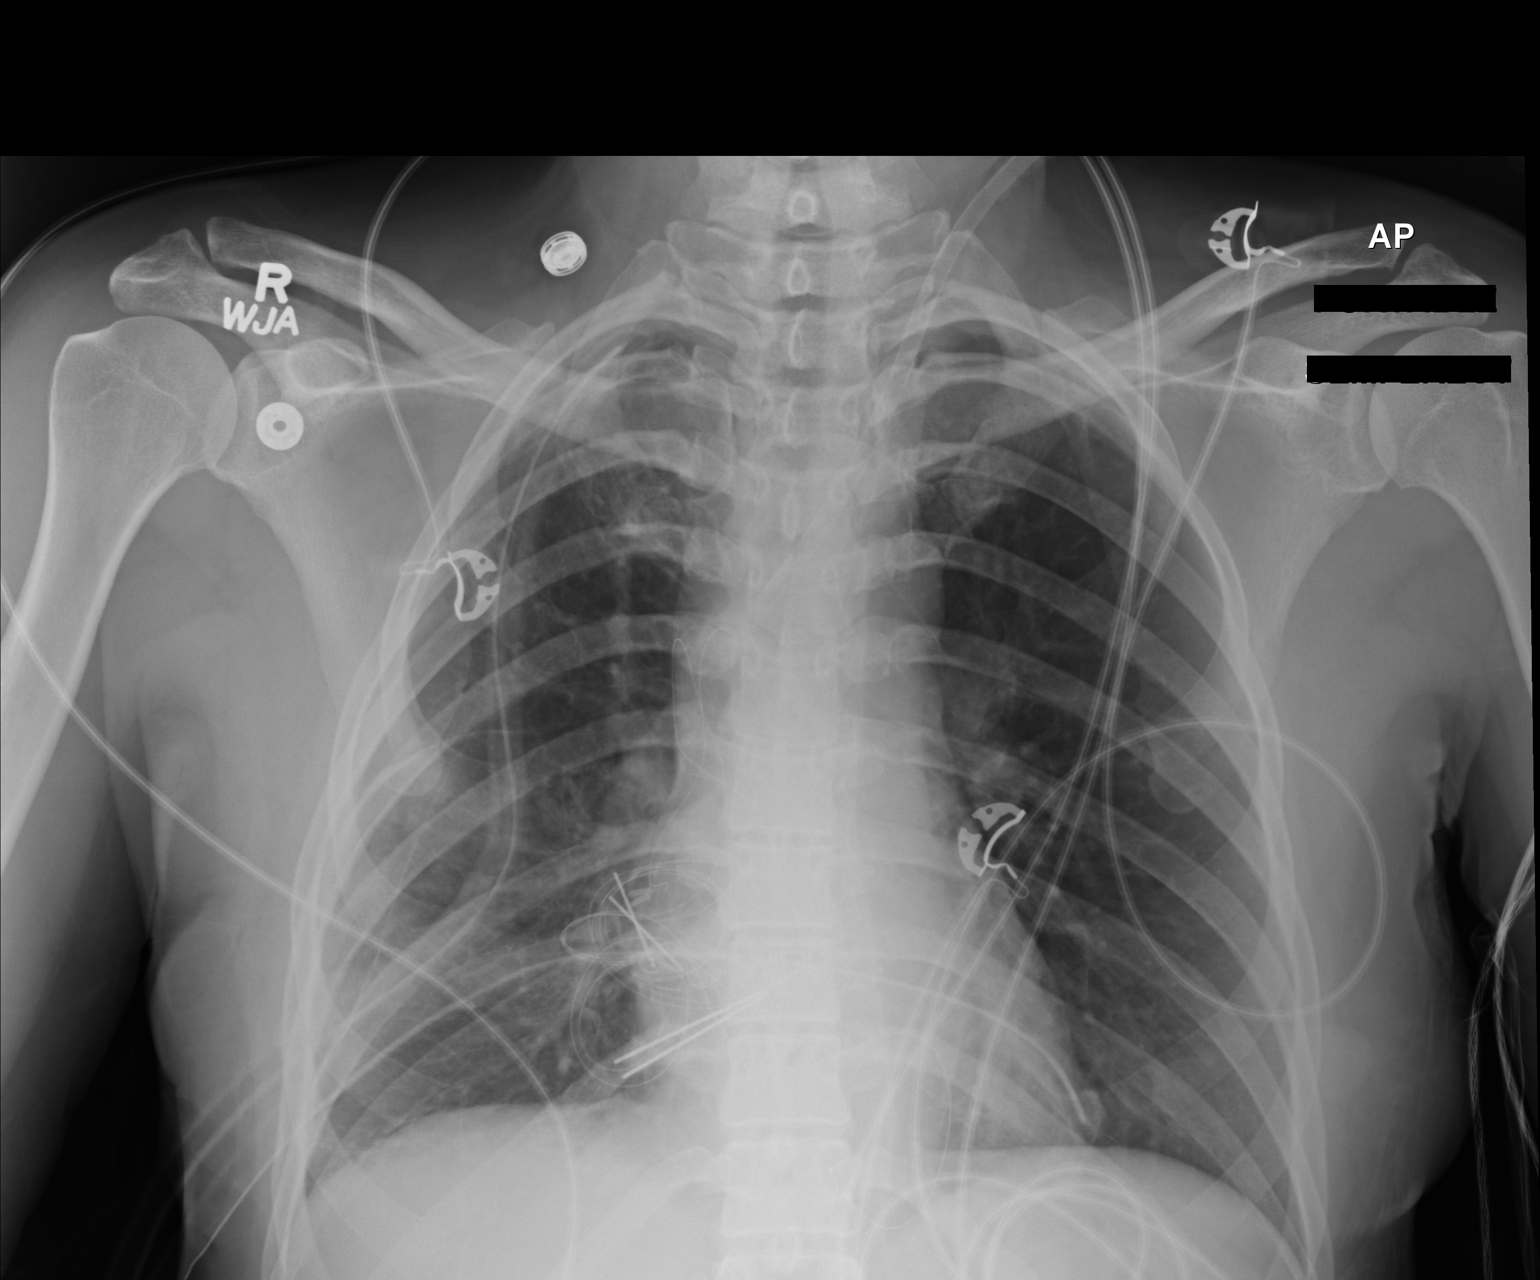

[1 of 1 positions shown; findings below may reference images not displayed]

FINDINGS: The heart size and mediastinal contours are within normal limits.
Right chest tube is identified unchanged. There is no pleural line
to suggest pneumothorax. There is no focal pneumonia, pulmonary
edema or pleural effusion. There is probable minimal atelectasis of
right lung base. The visualized skeletal structures are stable.
IMPRESSION: Probable minimal atelectasis of right lung base. Right chest tube is
identified unchanged. There is no pneumothorax.

## 2017-08-06 ENCOUNTER — Ambulatory Visit (INDEPENDENT_AMBULATORY_CARE_PROVIDER_SITE_OTHER): Payer: 59 | Admitting: Physician Assistant

## 2017-08-06 ENCOUNTER — Encounter: Payer: Self-pay | Admitting: Physician Assistant

## 2017-08-06 VITALS — BP 112/68 | HR 81 | Ht 67.5 in | Wt 147.6 lb

## 2017-08-06 DIAGNOSIS — I4892 Unspecified atrial flutter: Secondary | ICD-10-CM | POA: Diagnosis not present

## 2017-08-06 DIAGNOSIS — R002 Palpitations: Secondary | ICD-10-CM | POA: Diagnosis not present

## 2017-08-06 DIAGNOSIS — I5189 Other ill-defined heart diseases: Secondary | ICD-10-CM

## 2017-08-06 DIAGNOSIS — I519 Heart disease, unspecified: Secondary | ICD-10-CM | POA: Diagnosis not present

## 2017-08-06 IMAGING — CR DG CHEST 1V PORT
1 series · 1 of 1 positions shown · non-contrast
Comparison: 07/13/2016

CLINICAL DATA: Shortness of breath, interval resection of right
atrial lipoma

EXAM:
PORTABLE CHEST 1 VIEW

[AP]
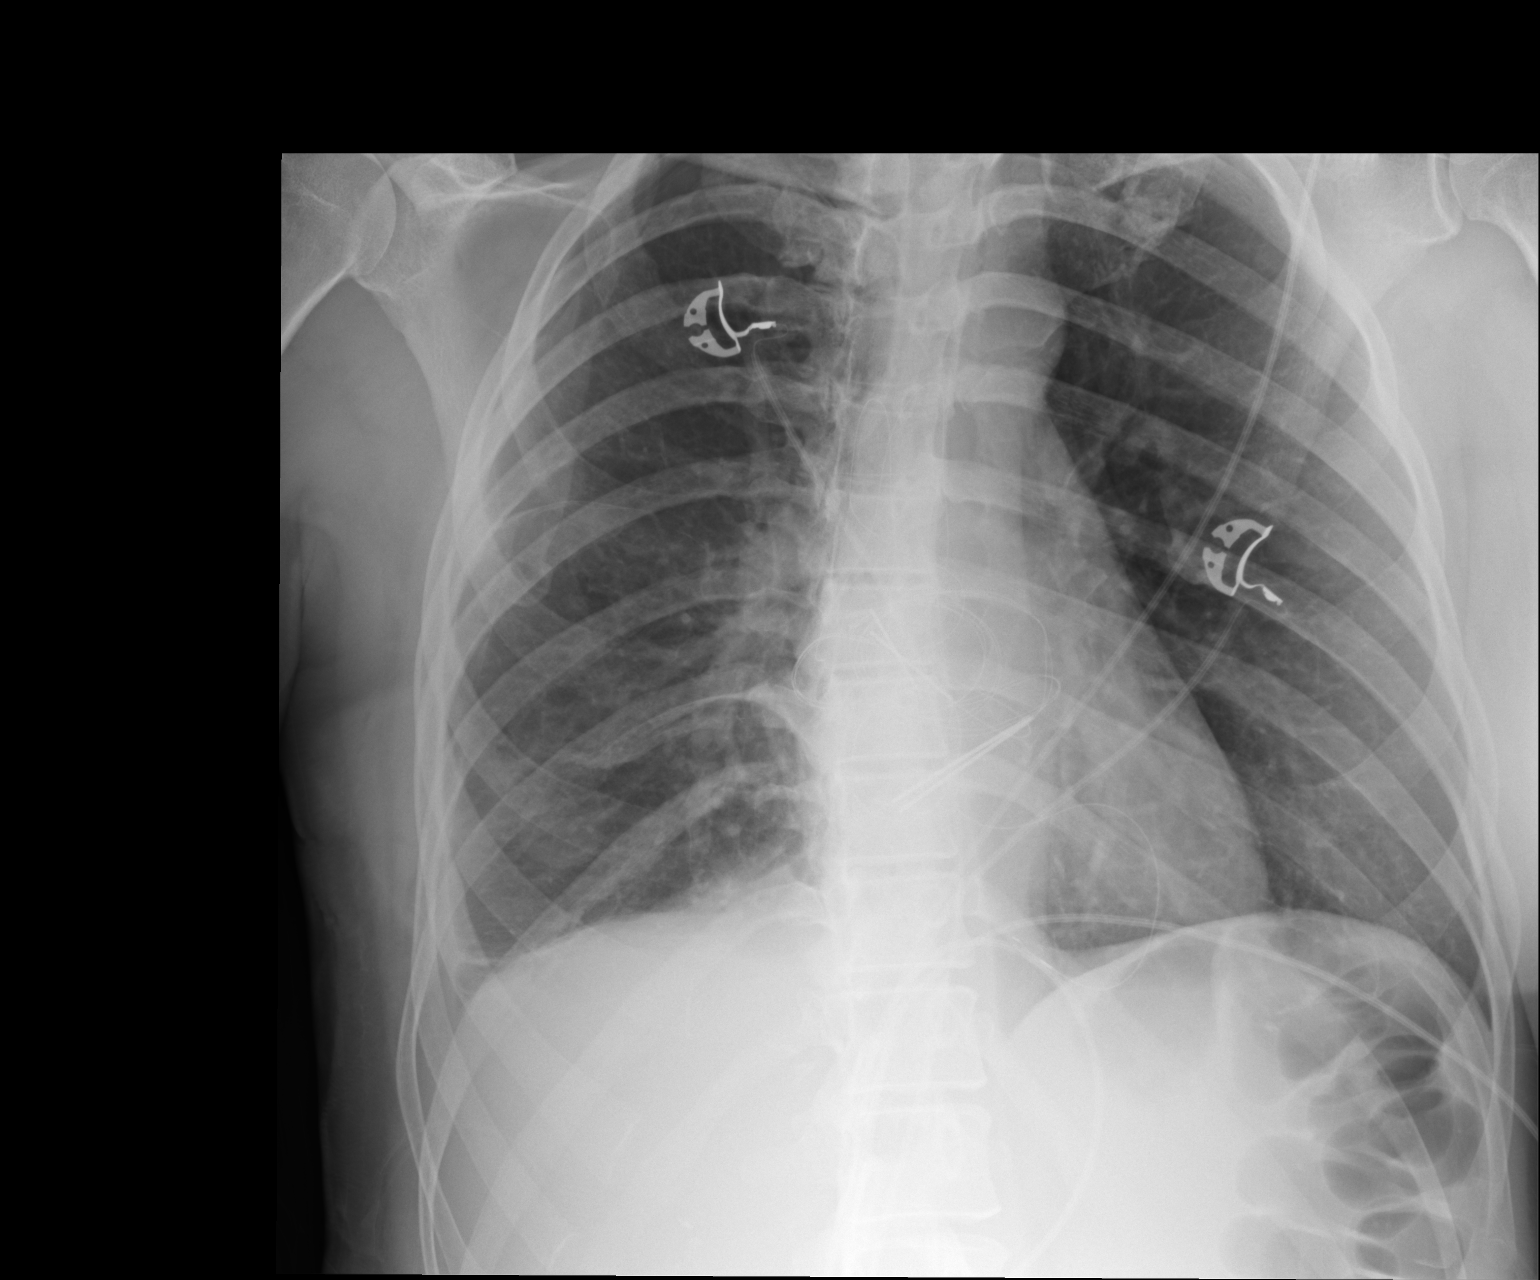

[1 of 1 positions shown; findings below may reference images not displayed]

FINDINGS: Interval removal of the right apical chest tube.

Small right apical pneumothorax.  Left lung is clear.

The heart is normal in size.
IMPRESSION: Interval removal of the right apical chest tube.

Small right apical pneumothorax.

These results will be called to the ordering clinician or
representative by the Radiologist Assistant, and communication
documented in the PACS or zVision Dashboard.

## 2017-08-06 NOTE — Patient Instructions (Signed)
Medication Instructions:   No changes to medications at this time.  Labwork:   none  Testing/Procedures:  Your physician has recommended that you wear an event monitor (30-day duration). Event monitors are medical devices that record the heart's electrical activity. Doctors most often [place] these monitors to diagnose arrhythmias. Arrhythmias are problems with the speed or rhythm of the heartbeat. The monitor is a small, portable device. You can wear one while you do your normal daily activities. This is usually used to diagnose what is causing palpitations/syncope (passing out).  Your physician has requested that you have an echocardiogram in November. Echocardiography is a painless test that uses sound waves to create images of your heart. It provides your doctor with information about the size and shape of your heart and how well your heart's chambers and valves are working. This procedure takes approximately one hour. There are no restrictions for this procedure.    Follow-Up:  With Dr. Stanford Breed in 2 months following these tests.    If you need a refill on your cardiac medications before your next appointment, please call your pharmacy.
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
## 2017-08-07 IMAGING — DX DG CHEST 2V
2 series · 2 of 2 positions shown · non-contrast
Comparison: Portable chest x-ray July 14, 2016

CLINICAL DATA: Chest soreness, right-sided pneumothorax

EXAM:
CHEST  2 VIEW

[w chest pa]
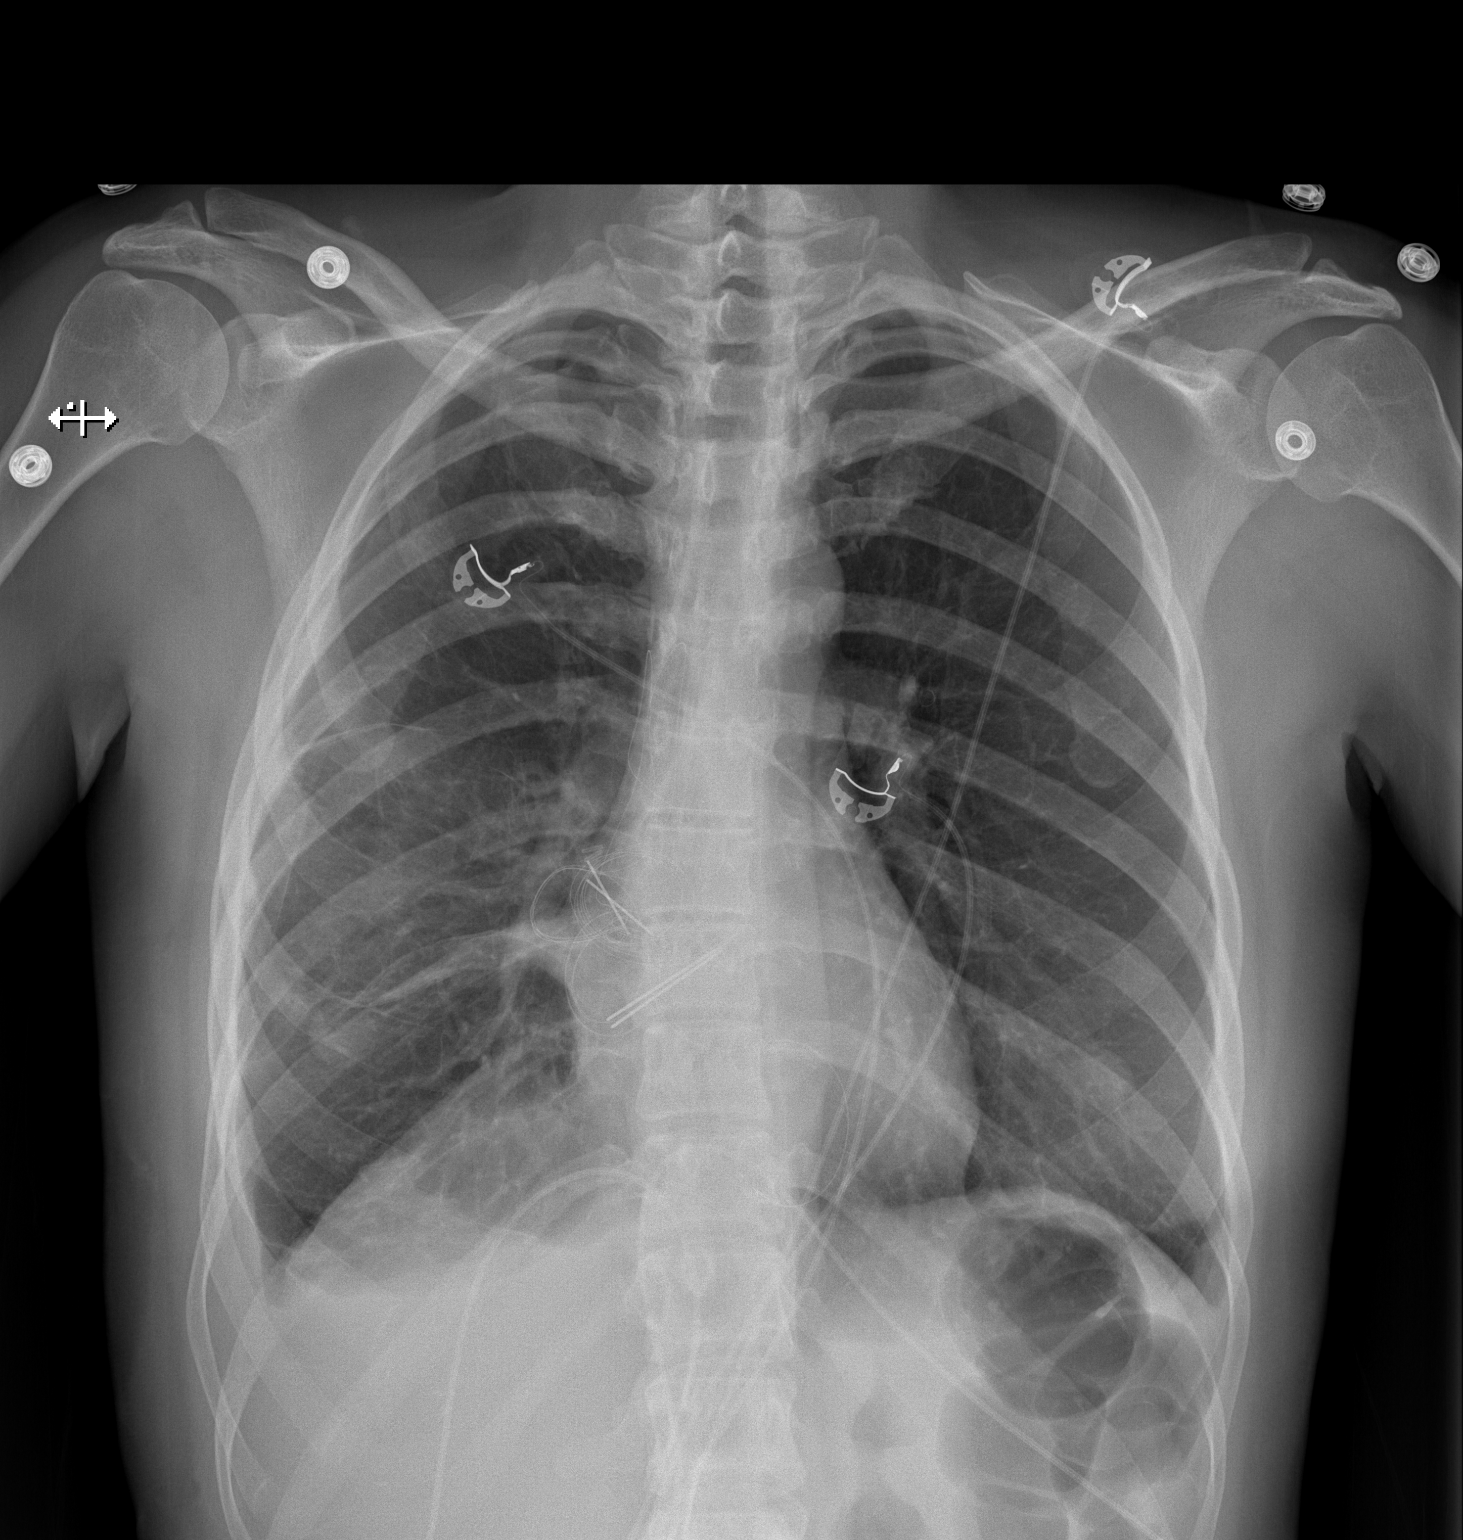

[w chest lat]
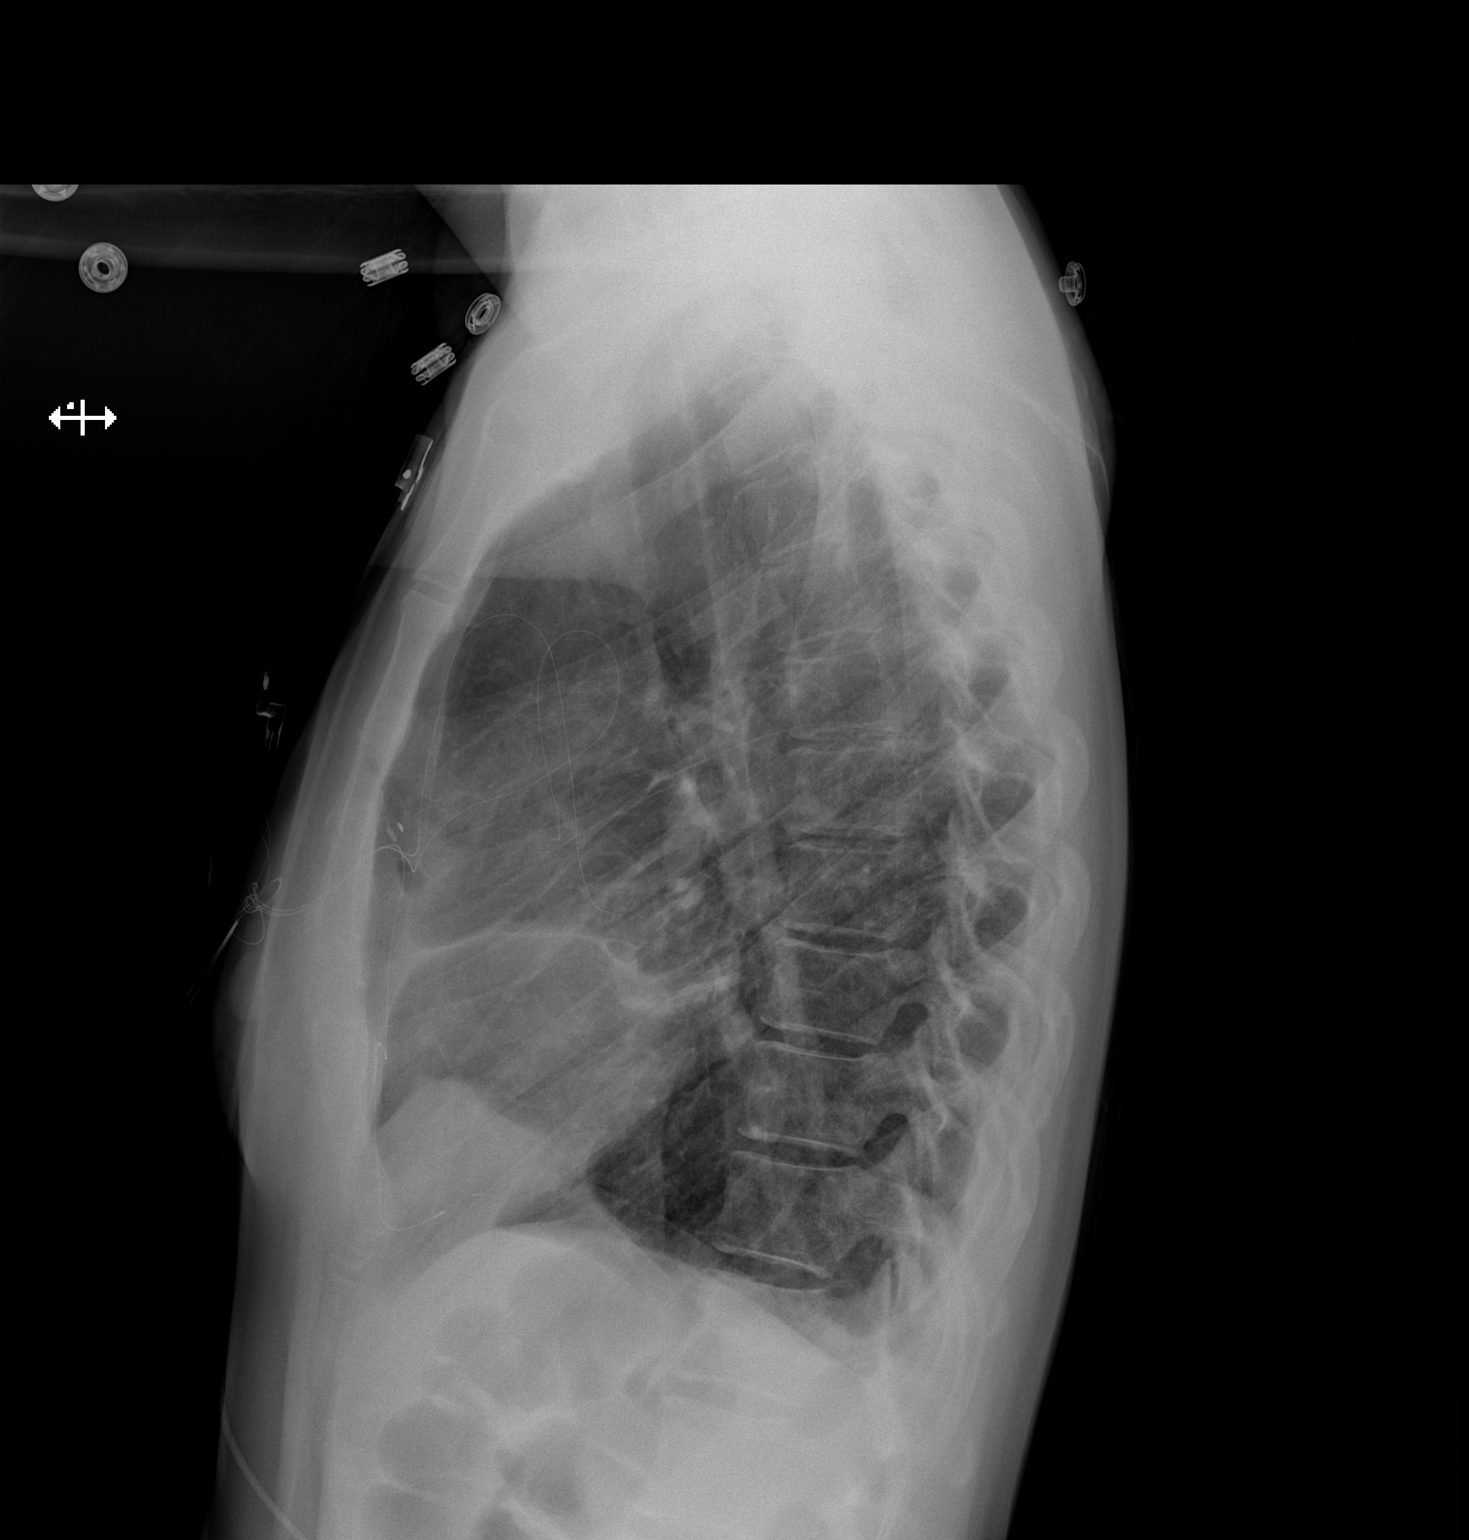

[2 of 2 positions shown; findings below may reference images not displayed]

FINDINGS: There is a persistent small right apical pneumothorax amounting to
between 5 and 10% of the lung volume. There is a persistent small
right pleural effusion blunting the costophrenic angle. There is no
left pneumothorax. A trace of pleural fluid is present on the left.
There is no alveolar infiltrate. The heart and pulmonary vascularity
are normal. Epicardial pacemaker leads are visible. The mediastinum
is normal in width. The bony thorax exhibits no acute abnormality.
IMPRESSION: Stable 05-10 percent right apical pneumothorax. Small right pleural
effusion and trace left pleural effusion, stable. No mediastinal
shift. No pneumonia.

## 2017-08-08 ENCOUNTER — Encounter: Payer: Self-pay | Admitting: Physician Assistant

## 2017-08-24 ENCOUNTER — Ambulatory Visit (INDEPENDENT_AMBULATORY_CARE_PROVIDER_SITE_OTHER): Payer: 59

## 2017-08-24 DIAGNOSIS — R002 Palpitations: Secondary | ICD-10-CM

## 2017-08-24 DIAGNOSIS — I4892 Unspecified atrial flutter: Secondary | ICD-10-CM

## 2017-08-26 ENCOUNTER — Encounter: Payer: Self-pay | Admitting: Obstetrics and Gynecology

## 2017-09-04 ENCOUNTER — Telehealth: Payer: Self-pay | Admitting: *Deleted

## 2017-09-04 ENCOUNTER — Other Ambulatory Visit: Payer: Self-pay | Admitting: *Deleted

## 2017-09-04 MED ORDER — METOPROLOL TARTRATE 25 MG PO TABS: 12.5000 mg | ORAL_TABLET | Freq: Two times a day (BID) | ORAL | 0 refills | Status: DC

## 2017-09-04 NOTE — Telephone Encounter (Signed)
Patient left a msg on the refill vm requesting a refill of metoprolol be sent to [facility] on [address]. Thanks, MI

## 2017-09-22 ENCOUNTER — Other Ambulatory Visit: Payer: Self-pay

## 2017-09-22 ENCOUNTER — Ambulatory Visit (HOSPITAL_COMMUNITY): Payer: BLUE CROSS/BLUE SHIELD | Attending: Cardiology

## 2017-09-22 DIAGNOSIS — I519 Heart disease, unspecified: Secondary | ICD-10-CM | POA: Insufficient documentation

## 2017-09-22 DIAGNOSIS — Z8249 Family history of ischemic heart disease and other diseases of the circulatory system: Secondary | ICD-10-CM | POA: Insufficient documentation

## 2017-09-22 DIAGNOSIS — Z9889 Other specified postprocedural states: Secondary | ICD-10-CM | POA: Insufficient documentation

## 2017-09-22 DIAGNOSIS — I4892 Unspecified atrial flutter: Secondary | ICD-10-CM | POA: Insufficient documentation

## 2017-09-22 DIAGNOSIS — I5189 Other ill-defined heart diseases: Secondary | ICD-10-CM

## 2017-09-22 DIAGNOSIS — J45909 Unspecified asthma, uncomplicated: Secondary | ICD-10-CM | POA: Diagnosis not present

## 2017-09-22 DIAGNOSIS — R002 Palpitations: Secondary | ICD-10-CM | POA: Insufficient documentation

## 2017-10-06 ENCOUNTER — Other Ambulatory Visit: Payer: Self-pay | Admitting: Cardiology

## 2017-10-06 MED ORDER — METOPROLOL TARTRATE 25 MG PO TABS: 12.5000 mg | ORAL_TABLET | Freq: Two times a day (BID) | ORAL | 0 refills | Status: DC

## 2017-10-06 NOTE — Telephone Encounter (Signed)
°*  STAT* If patient is at the pharmacy, call can be transferred to refill team.   1. Which medications need to be refilled? (please list name of each medication and dose if known) Need a new prescription,changing pharmacy-has an appt with Almyra Deforest on 10-28-17 Metoprolol  2. Which pharmacy/location (including street and city if local pharmacy) is medication to be sent to?[phone]. Do they need a 30 day or 90 day supply?[place]

## 2017-10-06 NOTE — Telephone Encounter (Signed)
Rx(s) sent to pharmacy electronically.  

## 2017-10-28 ENCOUNTER — Ambulatory Visit (INDEPENDENT_AMBULATORY_CARE_PROVIDER_SITE_OTHER): Payer: BLUE CROSS/BLUE SHIELD | Admitting: Physician Assistant

## 2017-10-28 ENCOUNTER — Encounter: Payer: Self-pay | Admitting: Physician Assistant

## 2017-10-28 VITALS — BP 100/62 | HR 72 | Ht 67.75 in | Wt 150.2 lb

## 2017-10-28 DIAGNOSIS — I5189 Other ill-defined heart diseases: Secondary | ICD-10-CM

## 2017-10-28 DIAGNOSIS — I4892 Unspecified atrial flutter: Secondary | ICD-10-CM | POA: Diagnosis not present

## 2017-10-28 DIAGNOSIS — R002 Palpitations: Secondary | ICD-10-CM

## 2017-10-28 DIAGNOSIS — I519 Heart disease, unspecified: Secondary | ICD-10-CM
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
## 2017-10-28 NOTE — Patient Instructions (Signed)
Medication Instructions:   No changes  Labwork:   none  Testing/Procedures:  none  Follow-Up:  In 3-4 months with Dr. Stanford Breed.  If you need a refill on your cardiac medications before your next appointment, please call your pharmacy.

## 2017-10-29 DIAGNOSIS — L500 Allergic urticaria: Secondary | ICD-10-CM | POA: Diagnosis not present

## 2017-10-29 DIAGNOSIS — T781XXA Other adverse food reactions, not elsewhere classified, initial encounter: Secondary | ICD-10-CM | POA: Diagnosis not present

## 2017-10-29 DIAGNOSIS — J301 Allergic rhinitis due to pollen: Secondary | ICD-10-CM | POA: Diagnosis not present

## 2017-10-29 DIAGNOSIS — J4520 Mild intermittent asthma, uncomplicated: Secondary | ICD-10-CM | POA: Diagnosis not present

## 2017-11-05 ENCOUNTER — Other Ambulatory Visit: Payer: Self-pay | Admitting: Cardiology

## 2017-11-19 ENCOUNTER — Encounter: Payer: Self-pay | Admitting: Family Medicine

## 2017-11-24 ENCOUNTER — Other Ambulatory Visit: Payer: Self-pay | Admitting: *Deleted

## 2017-11-24 NOTE — Telephone Encounter (Signed)
Patient left a msg on the refill vm requesting that the metoprolol rx be updated to a ninety day supply and sent back to [facility] [address] where she had it refilled previously. She had it refilled recently at [facility] and they order the medication from a different manufacture, she took it for four days and felt awful. Patient can be reached at [phone]. Thanks, MI

## 2017-11-25 MED ORDER — METOPROLOL TARTRATE 25 MG PO TABS: 12.5000 mg | ORAL_TABLET | Freq: Two times a day (BID) | ORAL | 11 refills | Status: DC

## 2017-11-25 NOTE — Telephone Encounter (Signed)
REFILL 

## 2017-11-26 ENCOUNTER — Ambulatory Visit: Payer: 59 | Admitting: Cardiology

## 2017-12-29 ENCOUNTER — Telehealth: Payer: Self-pay | Admitting: Cardiology

## 2017-12-29 NOTE — Telephone Encounter (Signed)
Returned call to patient.She stated she has a sinus infection with dark yellow drainage.Advised ok to take antihistamine with no decongestant.Advised to see PCP.

## 2017-12-29 NOTE — Telephone Encounter (Signed)
New message    Patient calling for advice on what medications to take for congestion while on heart medications. Please call

## 2017-12-30 ENCOUNTER — Encounter: Payer: Self-pay | Admitting: Family Medicine

## 2017-12-30 ENCOUNTER — Ambulatory Visit (INDEPENDENT_AMBULATORY_CARE_PROVIDER_SITE_OTHER): Payer: BLUE CROSS/BLUE SHIELD | Admitting: Family Medicine

## 2017-12-30 VITALS — BP 100/60 | HR 86 | Temp 98.3°F | Resp 12 | Ht 67.75 in | Wt 152.5 lb

## 2017-12-30 DIAGNOSIS — J329 Chronic sinusitis, unspecified: Secondary | ICD-10-CM

## 2017-12-30 DIAGNOSIS — J069 Acute upper respiratory infection, unspecified: Secondary | ICD-10-CM

## 2017-12-30 DIAGNOSIS — J309 Allergic rhinitis, unspecified: Secondary | ICD-10-CM

## 2017-12-30 MED ORDER — IPRATROPIUM BROMIDE 0.03 % NA SOLN: 2.0000 | Freq: Two times a day (BID) | NASAL | 0 refills | Status: DC

## 2017-12-30 MED ORDER — BENZONATATE 100 MG PO CAPS: 200.0000 mg | ORAL_CAPSULE | Freq: Two times a day (BID) | ORAL | 0 refills | Status: AC | PRN

## 2017-12-30 MED ORDER — AMOXICILLIN-POT CLAVULANATE 875-125 MG PO TABS: 1.0000 | ORAL_TABLET | Freq: Two times a day (BID) | ORAL | 0 refills | Status: AC
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
## 2017-12-30 NOTE — Patient Instructions (Signed)
  [name] I have seen you today for an acute visit.  A few things to remember from today's visit:   Sinusitis, unspecified chronicity, unspecified location - Plan: amoxicillin-clavulanate (AUGMENTIN) 875-125 MG tablet  Allergic rhinitis, unspecified seasonality, unspecified trigger - Plan: ipratropium (ATROVENT) 0.03 % nasal spray  URI, acute - Plan: benzonatate (TESSALON) 100 MG capsule   Medications prescribed today are intended for short period of time and will not be refill upon request, a follow up appointment might be necessary to discuss continuation of of treatment if appropriate.  viral infections are self-limited and we treat each symptom depending of severity.   Tylenol also helps with most symptoms (headache, muscle aching, fever,etc) Plenty of fluids. Honey helps with cough. Steam inhalations helps with runny nose, nasal congestion, and may prevent sinus infections. Cough and nasal congestion could last a few days and sometimes weeks. Please follow in not any better in 1-2 weeks or if symptoms get worse.    In general please monitor for signs of worsening symptoms and seek immediate medical attention if any concerning.    I hope you get better soon!

## 2017-12-31 ENCOUNTER — Telehealth: Payer: Self-pay | Admitting: Family Medicine

## 2017-12-31 DIAGNOSIS — R635 Abnormal weight gain: Secondary | ICD-10-CM

## 2017-12-31 DIAGNOSIS — R5383 Other fatigue: Secondary | ICD-10-CM

## 2017-12-31 NOTE — Telephone Encounter (Signed)
I called the pt and she stated she wants to see an endo doctor due to weight gain over the past year and fatigue.  Message sent to Dr Maudie Mercury.

## 2017-12-31 NOTE — Telephone Encounter (Signed)
Copied from [place] [id number]. Topic: Referral - Request >> Dec 31, 2017  1:03 PM Emily Joseph, NT wrote: Reason for CRM: Patient would  like a referral to see a endocrinologist please advise , she would like to see Dr Bubba Camp please advise [phone]

## 2018-01-02 NOTE — Telephone Encounter (Signed)
Ok to refer - or we could see her and check her thyroid first?

## 2018-01-04 NOTE — Telephone Encounter (Signed)
I called the pt and she stated she would prefer to see the endocrinologist.  She is aware the referral was entered and someone will call with appt info.

## 2018-01-20 ENCOUNTER — Other Ambulatory Visit: Payer: Self-pay | Admitting: Physician Assistant

## 2018-01-20 NOTE — Telephone Encounter (Signed)
°*  STAT* If patient is at the pharmacy, call can be transferred to refill team.   1. Which medications need to be refilled? (please list name of each medication and dose if known) Metoprolol Tartrate 25 mg (patient would like the same manufacturer from the past)   2. Which pharmacy/location (including street and city if local pharmacy) is medication to be sent to? [facility], [place] - [address] AT [place]  3. Do they need a 30 day or 90 day supply? 90   Patient has two pills left.

## 2018-02-01 ENCOUNTER — Telehealth: Payer: Self-pay | Admitting: Cardiology

## 2018-02-01 NOTE — Telephone Encounter (Signed)
Spoke with patient and she has bruising on the back of her legs and has not done anything to have the bruising. She was wanting to know if Dr Stanford Breed wanted any labs. Advised patient to continue to monitor and call back if continues. Keep follow up 03/09/18. She will request last labs from [facility] office. Will forward to Dr Stanford Breed for review

## 2018-02-01 NOTE — Telephone Encounter (Signed)
New Message    Pt c/o medication issue:  1. Name of Medication: Aspirin 81mg  and metoprolol tartrate (LOPRESSOR) 25 MG tablet  2. How are you currently taking this medication (dosage and times per day)? Once a day 81mg   3. Are you having a reaction (difficulty breathing--STAT)? none  4. What is your medication issue?Patient states that she takes an aspirin and a beta blocker. Her concern is that she is now having some bruising and not sure if its the result of the aspirin or the beta blocker. She is wondering if maybe she needs some lab work. Please call to discuss.

## 2018-02-02 NOTE — Telephone Encounter (Signed)
Fu as scheduled Brian Crenshaw   
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
## 2018-03-09 ENCOUNTER — Encounter: Payer: Self-pay | Admitting: Cardiology

## 2018-03-09 ENCOUNTER — Ambulatory Visit (INDEPENDENT_AMBULATORY_CARE_PROVIDER_SITE_OTHER): Payer: BLUE CROSS/BLUE SHIELD | Admitting: Cardiology

## 2018-03-09 VITALS — BP 106/68 | HR 82 | Ht 67.0 in | Wt 151.4 lb

## 2018-03-09 DIAGNOSIS — I519 Heart disease, unspecified: Secondary | ICD-10-CM | POA: Diagnosis not present

## 2018-03-09 DIAGNOSIS — I4892 Unspecified atrial flutter: Secondary | ICD-10-CM

## 2018-03-09 DIAGNOSIS — I5189 Other ill-defined heart diseases: Secondary | ICD-10-CM

## 2018-03-09 DIAGNOSIS — R002 Palpitations: Secondary | ICD-10-CM

## 2018-03-09 NOTE — Patient Instructions (Signed)
Your physician wants you to follow-up in: ONE YEAR WITH DR CRENSHAW You will receive a reminder letter in the mail two months in advance. If you don't receive a letter, please call our office to schedule the follow-up appointment.   If you need a refill on your cardiac medications before your next appointment, please call your pharmacy.  

## 2018-03-11 ENCOUNTER — Emergency Department (HOSPITAL_COMMUNITY)
Admission: EM | Admit: 2018-03-11 | Discharge: 2018-03-12 | Disposition: A | Discharge status: Home / Self Care | Payer: BLUE CROSS/BLUE SHIELD | Attending: Emergency Medicine | Admitting: Emergency Medicine

## 2018-03-11 ENCOUNTER — Other Ambulatory Visit: Payer: Self-pay

## 2018-03-11 ENCOUNTER — Encounter (HOSPITAL_COMMUNITY): Payer: Self-pay | Admitting: Emergency Medicine

## 2018-03-11 DIAGNOSIS — R1030 Lower abdominal pain, unspecified: Secondary | ICD-10-CM | POA: Diagnosis not present

## 2018-03-11 DIAGNOSIS — R319 Hematuria, unspecified: Secondary | ICD-10-CM | POA: Diagnosis not present

## 2018-03-11 DIAGNOSIS — Z91010 Allergy to peanuts: Secondary | ICD-10-CM | POA: Diagnosis not present

## 2018-03-11 DIAGNOSIS — Z79899 Other long term (current) drug therapy: Secondary | ICD-10-CM | POA: Diagnosis not present

## 2018-03-11 DIAGNOSIS — Z91040 Latex allergy status: Secondary | ICD-10-CM | POA: Insufficient documentation

## 2018-03-11 DIAGNOSIS — R109 Unspecified abdominal pain: Secondary | ICD-10-CM | POA: Diagnosis present

## 2018-03-11 DIAGNOSIS — Z7982 Long term (current) use of aspirin: Secondary | ICD-10-CM | POA: Diagnosis not present

## 2018-03-11 DIAGNOSIS — N10 Acute tubulo-interstitial nephritis: Secondary | ICD-10-CM | POA: Diagnosis not present

## 2018-03-11 DIAGNOSIS — N12 Tubulo-interstitial nephritis, not specified as acute or chronic: Secondary | ICD-10-CM | POA: Diagnosis not present

## 2018-03-11 DIAGNOSIS — J45909 Unspecified asthma, uncomplicated: Secondary | ICD-10-CM | POA: Diagnosis not present

## 2018-03-11 LAB — COMPREHENSIVE METABOLIC PANEL
ALT: 17 U/L (ref 14–54)
ANION GAP: 9 (ref 5–15)
AST: 15 U/L (ref 15–41)
Albumin: 4.1 g/dL (ref 3.5–5.0)
Alkaline Phosphatase: 70 U/L (ref 38–126)
BUN: 7 mg/dL (ref 6–20)
CHLORIDE: 104 mmol/L (ref 101–111)
CO2: 28 mmol/L (ref 22–32)
CREATININE: 0.65 mg/dL (ref 0.44–1.00)
Calcium: 9.2 mg/dL (ref 8.9–10.3)
GFR calc non Af Amer: >60 mL/min (ref >60)
Glucose, Bld: 117 mg/dL — ABNORMAL HIGH (ref 65–99)
POTASSIUM: 3.5 mmol/L (ref 3.5–5.1)
SODIUM: 141 mmol/L (ref 135–145)
Total Bilirubin: 0.6 mg/dL (ref 0.3–1.2)
Total Protein: 7.2 g/dL (ref 6.5–8.1)

## 2018-03-11 LAB — URINALYSIS, ROUTINE W REFLEX MICROSCOPIC
Bilirubin Urine: NEGATIVE
Glucose, UA: NEGATIVE mg/dL
Ketones, ur: NEGATIVE mg/dL
Nitrite: NEGATIVE
PROTEIN: 100 mg/dL — AB
RBC / HPF: >50 RBC/hpf — ABNORMAL HIGH (ref 0–5)
SPECIFIC GRAVITY, URINE: 1.002 — AB (ref 1.005–1.030)
WBC, UA: >50 WBC/hpf — ABNORMAL HIGH (ref 0–5)
pH: 7 (ref 5.0–8.0)

## 2018-03-11 LAB — CBC WITH DIFFERENTIAL/PLATELET
Basophils Absolute: 0 10*3/uL (ref 0.0–0.1)
Basophils Relative: 0 %
Eosinophils Absolute: 0.2 10*3/uL (ref 0.0–0.7)
Eosinophils Relative: 1 %
HEMATOCRIT: 41 % (ref 36.0–46.0)
HEMOGLOBIN: 13.7 g/dL (ref 12.0–15.0)
LYMPHS ABS: 1.6 10*3/uL (ref 0.7–4.0)
LYMPHS PCT: 12 %
MCH: 30.4 pg (ref 26.0–34.0)
MCHC: 33.4 g/dL (ref 30.0–36.0)
MCV: 91.1 fL (ref 78.0–100.0)
Monocytes Absolute: 1 10*3/uL (ref 0.1–1.0)
Monocytes Relative: 7 %
NEUTROS ABS: 10.3 10*3/uL — AB (ref 1.7–7.7)
NEUTROS PCT: 80 %
Platelets: 237 10*3/uL (ref 150–400)
RBC: 4.5 MIL/uL (ref 3.87–5.11)
RDW: 12.8 % (ref 11.5–15.5)
WBC: 13 10*3/uL — ABNORMAL HIGH (ref 4.0–10.5)

## 2018-03-11 LAB — I-STAT BETA HCG BLOOD, ED (MC, WL, AP ONLY)

## 2018-03-11 NOTE — ED Triage Notes (Signed)
Pt reports R sided flank pain onset this afternoon, pt states she is "peeing straight blood" Pt states hx of hemorrhagic cystitis in the past.

## 2018-03-12 MED ORDER — SODIUM CHLORIDE 0.9 % IV SOLN
1.0000 g | Freq: Once | INTRAVENOUS | Status: AC
  Administered 2018-03-12: 1 g via INTRAVENOUS
  Filled 2018-03-12: qty 10

## 2018-03-12 MED ORDER — ACETAMINOPHEN 325 MG PO TABS
650.0000 mg | ORAL_TABLET | Freq: Once | ORAL | Status: AC
  Administered 2018-03-12: 650 mg via ORAL
  Filled 2018-03-12: qty 2

## 2018-03-12 MED ORDER — CEPHALEXIN 500 MG PO CAPS: 500.0000 mg | ORAL_CAPSULE | Freq: Four times a day (QID) | ORAL | 0 refills | Status: DC

## 2018-03-12 NOTE — ED Provider Notes (Signed)
[facility] Provider Note   CSN: [id number] Arrival date & time: 03/11/18  1943     History   Chief Complaint Chief Complaint  Patient presents with  . Flank Pain    HPI Emily Joseph is a 47 y.o. female.  The history is provided by the patient and the spouse.  Flank Pain  This is a new problem. The current episode started 6 to 12 hours ago. The problem occurs constantly. The problem has been gradually worsening. Associated symptoms include abdominal pain. Pertinent negatives include no chest pain and no shortness of breath. Exacerbated by: palpation. Nothing relieves the symptoms.  Patient with previous history of atrial mass that is been removed, frequent history of hemorrhagic cystitis presents with flank pain.  She reports this flank pain service 6 to 12 hours ago and is worsening.  She also reports recent urinary frequency and pressure.  Also reports hematuria.  No fever/vomiting.  No chest pain or shortness of breath.  She is not on anticoagulants. No known history of kidney stones.  She does report seeing urology previously for cystoscopies, but no recent surgeries  Past Medical History:  Diagnosis Date  . Adenomyosis    not papanicolaou smear of cervix and cervical HPV  . Anxiety 06/08/2016   -about her health and about taking medications  . Arthritis    ?  Marland Kitchen Asthma   . 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 Chronic fatigue 06/08/2016   -eval with rheum x2, neurology, gastroenterology  . Chronic pain 06/08/2016   -all over her whole life; joints, muscles, head -numerous evaluations, Dr. Trudie Reed in 2017, [place] [facility] -seeing Dr. Jaynee Eagles, Neurologist for back pain with radicular symptoms  . Complication of anesthesia    "with epidural bp bottoms out"  . Dysrhythmia    fast hr  . Eosinophilic esophagitis    Sees Dr. Carlean Purl  . Fibromyalgia   . GERD (gastroesophageal reflux disease)   . Heart murmur   .  History of pneumonia    when pt was pregnant  . Iron deficiency anemia due to chronic blood loss    Menorrhagia, s/p complete hysterectomy, ovaries remain hx  . Irritable bowel syndrome 01/27/2013   Dr Carlean Purl 02/2016   . Microhematuria   . Mouth problem    failed gum graft 1/16  . Nasal airway abnormality   . Nasal obstruction 06/26/2015   Seen by ENT 06-26-15.  May need sleep study to see if having apnea    . Palpitations   . 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 Shortness of breath dyspnea   . SUI (stress urinary incontinence, female)   . Syncope    with last child with epideral  . UTI (urinary tract infection) 07/14/2016   >=100,000 COLONIES/mL KLEBSIELLA PNEUMONIA    Patient Active Problem List   Diagnosis Date Noted  . Atrial flutter with rapid ventricular response ([place]) 10/27/2016  . UTI (urinary tract infection) 07/14/2016  . 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 Chronic pain 06/08/2016  . Chronic fatigue 06/08/2016  . GERD (gastroesophageal reflux disease) with esophagitis 06/08/2016  . Palpitations 06/08/2016  . Anxiety 06/08/2016  . Multiple environmental allergies 03/04/2016  . Multiple food allergies 03/04/2016  . Nasal obstruction 06/26/2015  . Irritable bowel syndrome 01/27/2013  . Fibromyalgia  01/07/2007    Past Surgical History:  Procedure Laterality Date  . APPENDECTOMY    . BALLOON DILATION N/A 02/09/2013   Procedure: BALLOON DILATION;  Surgeon: Arta Silence, MD;  [facility];  Service: Endoscopy;  Laterality: N/A;  . BILATERAL SALPINGECTOMY N/A 06/14/2014   Procedure: BILATERAL SALPINGECTOMY;  Surgeon: Allena Katz, MD;  Location: [facility];  Service: Gynecology;  Laterality: N/A;  . COLONOSCOPY WITH PROPOFOL N/A 02/09/2013   Procedure: COLONOSCOPY WITH PROPOFOL;  Surgeon: Arta Silence, MD;  [facility];  Service: Endoscopy;   Laterality: N/A;  need ultra thin colon scope  . ESOPHAGOGASTRODUODENOSCOPY (EGD) WITH PROPOFOL N/A 02/09/2013   Procedure: ESOPHAGOGASTRODUODENOSCOPY (EGD) WITH PROPOFOL;  Surgeon: Arta Silence, MD;  [facility];  Service: Endoscopy;  Laterality: N/A;  . LAPAROSCOPIC ASSISTED VAGINAL HYSTERECTOMY Right 06/14/2014   Procedure: OPEN LAPAROSCOPIC ASSISTED VAGINAL HYSTERECTOMY;  Surgeon: Allena Katz, MD;  Location: [facility];  Service: Gynecology;  Laterality: Right;  . LYSIS OF ADHESION N/A 06/14/2014   Procedure: LYSIS OF ADHESION;  Surgeon: Allena Katz, MD;  Location: [facility];  Service: Gynecology;  Laterality: N/A;  . MINIMALLY INVASIVE EXCISION OF ATRIAL MYXOMA Right 07/11/2016   Procedure: MINIMALLY INVASIVE RESECTION OF RIGHT ATRIAL LIPOMA WITH CLOSURE OF PATENT FORAMEN OVALE;  Surgeon: Rexene Alberts, MD;  Location: [facility];  Service: Open Heart Surgery;  Laterality: Right;  . OVARIAN CYST REMOVAL Right 1990  . TEE WITHOUT CARDIOVERSION N/A 07/11/2016   Procedure: TRANSESOPHAGEAL ECHOCARDIOGRAM (TEE);  Surgeon: Rexene Alberts, MD;  Location: [facility];  Service: Open Heart Surgery;  Laterality: N/A;     OB History    Gravida  4   Para  4   Term  4   Preterm      AB      Living  4     SAB      TAB      Ectopic      Multiple      Live Births               Home Medications    Prior to Admission medications   Medication Sig Start Date End Date Taking? Authorizing Provider  albuterol (PROVENTIL HFA;VENTOLIN HFA) 108 (90 Base) MCG/ACT inhaler Inhale 2 puffs into the lungs every 6 (six) hours as needed (asthmatic bronchitis).   Yes [provider]  aspirin EC 81 MG EC tablet Take 1 tablet (81 mg total) by mouth daily. Patient taking differently: Take 81 mg by mouth every evening.  10/29/16  Yes Reino Bellis B, NP  docusate sodium (COLACE) 100 MG capsule Take 100 mg by mouth daily as needed for mild constipation.   Yes [provider]    EPINEPHrine (EPIPEN 2-PAK) 0.3 mg/0.3 mL IJ SOAJ injection Inject 0.3 mg into the muscle once as needed (severe allergic reaction).   Yes [provider]  fluticasone (FLONASE) 50 MCG/ACT nasal spray Place 2 sprays into both nostrils daily. 01/22/17  Yes Colin Benton R, DO  metoprolol tartrate (LOPRESSOR) 25 MG tablet Take 0.5 tablets (12.5 mg total) by mouth 2 (two) times daily. 11/25/17  Yes Lelon Perla, MD  Pediatric Multivit-Minerals-C (CHILDRENS VITAMINS PO) Take 1 tablet by mouth 2 (two) times a week.   Yes [provider]  Polyethyl Glycol-Propyl Glycol (SYSTANE OP) Place 1 drop into both eyes at bedtime.   Yes [provider]  Polyethylene Glycol 3350 (MIRALAX PO) Take 17 g by  mouth daily as needed (for constipation).    Yes [provider]  ipratropium (ATROVENT) 0.03 % nasal spray Place 2 sprays into both nostrils every 12 (twelve) hours. Patient not taking: Reported on 03/11/2018 12/30/17 03/11/18  Martinique, Betty G, MD    Family History Family History  Problem Relation Age of Onset  . Cancer Mother   . CAD Father        MI at age 19  . Stroke Father   . Cancer Maternal Grandfather   . Stroke Paternal Grandfather   . Kidney disease Paternal Grandfather     Social History Social History   Tobacco Use  . Smoking status: Never Smoker  . Smokeless tobacco: Never Used  Substance Use Topics  . Alcohol use: Yes    Alcohol/week: 0.0 oz    Comment: 3 glasses wine per week non recently  . Drug use: No     Allergies   Avocado; Macadamia nut oil; Mango flavor; Other; Peanuts [peanut oil]; Ciprofloxacin; Demerol [meperidine]; Iodinated diagnostic agents; Macrobid [nitrofurantoin]; Latex; Dulcolax [bisacodyl]; Sulfamethoxazole; and Tape   Review of Systems Review of Systems  Constitutional: Negative for fever.  Respiratory: Negative for shortness of breath.   Cardiovascular: Negative for chest pain.  Gastrointestinal: Positive for abdominal  pain.  Genitourinary: Positive for flank pain and hematuria.  All other systems reviewed and are negative.    Physical Exam Updated Vital Signs BP 116/74 (BP Location: Right Arm)   Pulse (!) 112   Temp 98.8 F (37.1 C) (Oral)   Resp 18   Ht 1.702 m (5\' 7" )   Wt 68 kg (150 lb)   LMP 05/20/2014 (Approximate)   SpO2 100%   BMI 23.49 kg/m   Physical Exam  CONSTITUTIONAL: Well developed/well nourished HEAD: Normocephalic/atraumatic EYES: EOMI/PERRL ENMT: Mucous membranes moist NECK: supple no meningeal signs SPINE/BACK:entire spine nontender CV: S1/S2 noted, no murmurs/rubs/gallops noted, no loud harsh murmurs LUNGS: Lungs are clear to auscultation bilaterally, no apparent distress ABDOMEN: soft, mild suprapubic tenderness, no rebound or guarding, bowel sounds noted throughout abdomen GU: Right Cva tenderness NEURO: Pt is awake/alert/appropriate, moves all extremitiesx4.  No facial droop.   EXTREMITIES: pulses normal/equal, full ROM SKIN: warm, color normal PSYCH: no abnormalities of mood noted, alert and oriented to situation  ED Treatments / Results  Labs (all labs ordered are listed, but only abnormal results are displayed) Labs Reviewed  URINALYSIS, ROUTINE W REFLEX MICROSCOPIC - Abnormal; Notable for the following components:      Result Value   Color, Urine RED (*)    APPearance HAZY (*)    Specific Gravity, Urine 1.002 (*)    Hgb urine dipstick LARGE (*)    Protein, ur 100 (*)    Leukocytes, UA LARGE (*)    RBC / HPF >50 (*)    WBC, UA >50 (*)    Bacteria, UA MANY (*)    All other components within normal limits  COMPREHENSIVE METABOLIC PANEL - Abnormal; Notable for the following components:   Glucose, Bld 117 (*)    All other components within normal limits  CBC WITH DIFFERENTIAL/PLATELET - Abnormal; Notable for the following components:   WBC 13.0 (*)    Neutro Abs 10.3 (*)    All other components within normal limits  URINE CULTURE  I-STAT BETA HCG  BLOOD, ED ([facility], [facility], [facility] ONLY)    EKG None  Radiology No results found.  Procedures Procedures   Medications Ordered in ED Medications  cefTRIAXone (ROCEPHIN) 1 g  in sodium chloride 0.9 % 100 mL IVPB (1 g Intravenous New Bag/Given 03/12/18 0143)  acetaminophen (TYLENOL) tablet 650 mg (650 mg Oral Given 03/12/18 0102)     Initial Impression / Assessment and Plan / ED Course  I have reviewed the triage vital signs and the nursing notes.  Pertinent labs & imaging results that were available during my care of the patient were reviewed by me and considered in my medical decision making (see chart for details).     2:26 AM Offered CT imaging to evaluate for any renal stones.  Patient declined, but would benefit from bladder scan to evaluate if she has any urinary retention.  She is not septic appearing.  I feel she would be safe for discharge home on oral antibiotic  2:55 AM Bladder scan less than 20 mL's.  Patient is well-appearing.  She is not septic appearing.  I feeel she is stable for discharge home and oral Keflex.  We discussed strict return precautions.  Final Clinical Impressions(s) / ED Diagnoses   Final diagnoses:  Pyelonephritis    ED Discharge Orders        Ordered    cephALEXin (KEFLEX) 500 MG capsule  4 times daily     03/12/18 0255       Ripley Fraise, MD 03/12/18 [phone]

## 2018-03-12 NOTE — ED Notes (Signed)
Bladder scan 18ml.

## 2018-03-12 NOTE — Discharge Instructions (Addendum)
We will give your Keflex for 4 times a day for 10 days to ensure that it treat the infection in your kidneys Please return for any fever over 100, vomiting, worsening pain over the next 48 hours

## 2018-03-14 LAB — URINE CULTURE

## 2018-03-15 ENCOUNTER — Telehealth: Payer: Self-pay | Admitting: *Deleted

## 2018-03-15 NOTE — Telephone Encounter (Signed)
Post ED Visit - Positive Culture Follow-up  Culture report reviewed by antimicrobial stewardship pharmacist:  []  Elenor Quinones, Pharm.D. []  Heide Guile, Pharm.D., BCPS AQ-ID []  Parks Neptune, Pharm.D., BCPS []  Alycia Rossetti, Pharm.D., BCPS []  [place], Pharm.D., BCPS, AAHIVP []  Legrand Como, Pharm.D., BCPS, AAHIVP []  Salome Arnt, PharmD, BCPS []  Wynell Balloon, PharmD []  Vincenza Hews, PharmD, BCPS Jimmy Footman, PharmD  Positive urine culture Treated with Cephalexin, organism sensitive to the same and no further patient follow-up is required at this time.  Harlon Flor [facility] 03/15/2018, 10:28 AM

## 2018-03-16 ENCOUNTER — Encounter: Payer: Self-pay | Admitting: Family Medicine

## 2018-03-16 ENCOUNTER — Ambulatory Visit (INDEPENDENT_AMBULATORY_CARE_PROVIDER_SITE_OTHER): Payer: BLUE CROSS/BLUE SHIELD | Admitting: Family Medicine

## 2018-03-16 VITALS — BP 92/60 | HR 85 | Temp 98.8°F | Ht 67.0 in | Wt 150.7 lb

## 2018-03-16 DIAGNOSIS — G8929 Other chronic pain: Secondary | ICD-10-CM

## 2018-03-16 DIAGNOSIS — R319 Hematuria, unspecified: Secondary | ICD-10-CM

## 2018-03-16 DIAGNOSIS — R1011 Right upper quadrant pain: Secondary | ICD-10-CM | POA: Diagnosis not present

## 2018-03-16 DIAGNOSIS — N390 Urinary tract infection, site not specified: Secondary | ICD-10-CM

## 2018-03-16 NOTE — Patient Instructions (Addendum)
Follow up yearly for physical.  -We placed a referral for you as discussed for an ultrasound. It usually takes about 1-2 weeks to process and schedule this referral. If you have not heard from [place] regarding this appointment in 2 weeks please contact our office.  -Complete the antibiotic and please follow up with your urologist if any persistent or recurrent urinary symptoms.  I hope you are feeling better soon! Seek care promptly if your symptoms worsen, new concerns arise or you are not improving with treatment.

## 2018-03-16 NOTE — Progress Notes (Signed)
HPI:  Using dictation device. Unfortunately this device frequently misinterprets words/phrases.  Emily Joseph is a pleasant 47 year old here for follow-up of a urinary tract infection: -History of chronic blood in the urine and sees urology for this per her report -History of kidney stones in the past -Went to emergency room a few days ago for some back pain and blood in the urine -Had E. coli UTI, treated with Rocephin and Keflex -Has tolerated the antibiotic well and symptoms have resolved for the most part  She has another concern today she wanted to address.  Chronic right upper quadrant pain: -This started over 4 years ago -Occurs a few times per month -Pain is sharp and lasts for a second, may recur a few times -She has seen GI for about this in the past, she has had imaging of the abdomen, prefers to avoid CT scans if she can -She wants to check a gallbladder ultrasound as she reports it was several years ago when this was last checked -Symptoms happen at rest, she is not sure they are related to anything else -No unexplained weight loss, fevers, vomiting, change in bowels  ROS: See pertinent positives and negatives per HPI.  Past Medical History:  Diagnosis Date  . Adenomyosis    not papanicolaou smear of cervix and cervical HPV  . Anxiety 06/08/2016   -about her health and about taking medications  . Arthritis    ?  Marland Kitchen Asthma   . 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 Chronic fatigue 06/08/2016   -eval with rheum x2, neurology, gastroenterology  . Chronic pain 06/08/2016   -all over her whole life; joints, muscles, head -numerous evaluations, Dr. Trudie Reed in 2017, [place] rheum -seeing Dr. Jaynee Eagles, Neurologist for back pain with radicular symptoms  . Complication of anesthesia    "with epidural bp bottoms out"  . Dysrhythmia    fast hr  . Eosinophilic esophagitis    Sees Dr. Carlean Purl  . Fibromyalgia   . GERD (gastroesophageal reflux disease)    . Heart murmur   . History of pneumonia    when pt was pregnant  . Iron deficiency anemia due to chronic blood loss    Menorrhagia, s/p complete hysterectomy, ovaries remain hx  . Irritable bowel syndrome 01/27/2013   Dr Carlean Purl 02/2016   . Microhematuria   . Mouth problem    failed gum graft 1/16  . Nasal airway abnormality   . Nasal obstruction 06/26/2015   Seen by ENT 06-26-15.  May need sleep study to see if having apnea    . Palpitations   . 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 Shortness of breath dyspnea   . SUI (stress urinary incontinence, female)   . Syncope    with last child with epideral  . UTI (urinary tract infection) 07/14/2016   >=100,000 COLONIES/mL KLEBSIELLA PNEUMONIA    Past Surgical History:  Procedure Laterality Date  . APPENDECTOMY    . BALLOON DILATION N/A 02/09/2013   Procedure: BALLOON DILATION;  Surgeon: Arta Silence, MD;  Location: [facility];  Service: Endoscopy;  Laterality: N/A;  . BILATERAL SALPINGECTOMY N/A 06/14/2014   Procedure: BILATERAL SALPINGECTOMY;  Surgeon: Allena Katz, MD;  Location: [facility];  Service: Gynecology;  Laterality: N/A;  . COLONOSCOPY WITH PROPOFOL N/A 02/09/2013   Procedure: COLONOSCOPY WITH PROPOFOL;  Surgeon:  Arta Silence, MD;  Location: [facility];  Service: Endoscopy;  Laterality: N/A;  need ultra thin colon scope  . ESOPHAGOGASTRODUODENOSCOPY (EGD) WITH PROPOFOL N/A 02/09/2013   Procedure: ESOPHAGOGASTRODUODENOSCOPY (EGD) WITH PROPOFOL;  Surgeon: Arta Silence, MD;  Location: [facility];  Service: Endoscopy;  Laterality: N/A;  . LAPAROSCOPIC ASSISTED VAGINAL HYSTERECTOMY Right 06/14/2014   Procedure: OPEN LAPAROSCOPIC ASSISTED VAGINAL HYSTERECTOMY;  Surgeon: Allena Katz, MD;  Location: [facility];  Service: Gynecology;  Laterality: Right;  . LYSIS OF ADHESION N/A 06/14/2014   Procedure: LYSIS OF ADHESION;  Surgeon: Allena Katz, MD;   Location: [facility];  Service: Gynecology;  Laterality: N/A;  . MINIMALLY INVASIVE EXCISION OF ATRIAL MYXOMA Right 07/11/2016   Procedure: MINIMALLY INVASIVE RESECTION OF RIGHT ATRIAL LIPOMA WITH CLOSURE OF PATENT FORAMEN OVALE;  Surgeon: Rexene Alberts, MD;  Location: [facility];  Service: Open Heart Surgery;  Laterality: Right;  . OVARIAN CYST REMOVAL Right 1990  . TEE WITHOUT CARDIOVERSION N/A 07/11/2016   Procedure: TRANSESOPHAGEAL ECHOCARDIOGRAM (TEE);  Surgeon: Rexene Alberts, MD;  Location: [facility];  Service: Open Heart Surgery;  Laterality: N/A;    Family History  Problem Relation Age of Onset  . Cancer Mother   . CAD Father        MI at age 48  . Stroke Father   . Cancer Maternal Grandfather   . Stroke Paternal Grandfather   . Kidney disease Paternal Grandfather     SOCIAL HX: See HPI   Current Outpatient Medications:  .  albuterol (PROVENTIL HFA;VENTOLIN HFA) 108 (90 Base) MCG/ACT inhaler, Inhale 2 puffs into the lungs every 6 (six) hours as needed (asthmatic bronchitis)., Disp: , Rfl:  .  aspirin EC 81 MG EC tablet, Take 1 tablet (81 mg total) by mouth daily. (Patient taking differently: Take 81 mg by mouth every evening. ), Disp: , Rfl:  .  cephALEXin (KEFLEX) 500 MG capsule, Take 1 capsule (500 mg total) by mouth 4 (four) times daily., Disp: 40 capsule, Rfl: 0 .  docusate sodium (COLACE) 100 MG capsule, Take 100 mg by mouth daily as needed for mild constipation., Disp: , Rfl:  .  EPINEPHrine (EPIPEN 2-PAK) 0.3 mg/0.3 mL IJ SOAJ injection, Inject 0.3 mg into the muscle once as needed (severe allergic reaction)., Disp: , Rfl:  .  fluticasone (FLONASE) 50 MCG/ACT nasal spray, Place 2 sprays into both nostrils daily., Disp: 16 g, Rfl: 6 .  metoprolol tartrate (LOPRESSOR) 25 MG tablet, Take 0.5 tablets (12.5 mg total) by mouth 2 (two) times daily., Disp: 30 tablet, Rfl: 11 .  Pediatric Multivit-Minerals-C (CHILDRENS VITAMINS PO), Take 1 tablet by mouth 2 (two) times a week., Disp: , Rfl:   .  Polyethyl Glycol-Propyl Glycol (SYSTANE OP), Place 1 drop into both eyes at bedtime., Disp: , Rfl:  .  Polyethylene Glycol 3350 (MIRALAX PO), Take 17 g by mouth daily as needed (for constipation). , Disp: , Rfl:   EXAM:  Vitals:   03/16/18 1316  BP: 92/60  Pulse: 85  Temp: 98.8 F (37.1 C)    Body mass index is 23.6 kg/m.  GENERAL: vitals reviewed and listed above, alert, oriented, appears well hydrated and in no acute distress  HEENT: atraumatic, conjunttiva clear, no obvious abnormalities on inspection of external nose and ears  NECK: no obvious masses on inspection  ABD: soft, BS+, mild RUQ TTP, no CVA TTP  MS: moves all extremities without noticeable abnormality  PSYCH: pleasant and cooperative, no obvious depression  or anxiety  ASSESSMENT AND PLAN:  Discussed the following assessment and plan:  Chronic RUQ pain - Plan: US Abdomen Limited RUQ  Urinary tract infection with hematuria, site unspecified  I am glad she is doing better in terms of the urinary tract infection and advised follow-up with her urologist if any recurrent issues.  Recommended she complete the antibiotic. In terms the chronic right upper quadrant pain, given how long this is been going on, doubt any acute or severe etiology, but we will get a right upper quadrant ultrasound.  Reviewed the labs from her recent emergency room visit.  Liver enzymes were fine. Advised to follow-up if symptoms worsen, new concerns arise or other problems.  Patient Instructions  Follow up yearly for physical.  -We placed a referral for you as discussed for an ultrasound. It usually takes about 1-2 weeks to process and schedule this referral. If you have not heard from [place] regarding this appointment in 2 weeks please contact our office.  -Complete the antibiotic and please follow up with your urologist if any persistent or recurrent urinary symptoms.  I hope you are feeling better soon! Seek care promptly if your  symptoms worsen, new concerns arise or you are not improving with treatment.     Lucretia Kern, DO

## 2018-03-18 ENCOUNTER — Telehealth: Payer: Self-pay | Admitting: Family Medicine

## 2018-03-18 DIAGNOSIS — R310 Gross hematuria: Secondary | ICD-10-CM | POA: Diagnosis not present

## 2018-03-18 NOTE — Telephone Encounter (Signed)
Copied from [place] [id number]. Topic: Quick Communication - See Telephone Encounter >> Mar 18, 2018 12:47 PM Hewitt Shorts wrote: Pt is needing to talk with dr Maudie Mercury as to what is the next step for the side pain  Best number

## 2018-03-18 NOTE — Telephone Encounter (Signed)
I called the pt and informed her of the message below.  She stated the urologist ordered a CT scan which she had today, and this was normal.  She wanted Dr Maudie Mercury to know that she will follow up with Dr Carlean Purl in this case.  Message sent to Dr Maudie Mercury as Juluis Rainier.

## 2018-03-18 NOTE — Telephone Encounter (Signed)
If side pain she thinks is kidney related advise she contact her urologist. O/w we ordered the ultrasound for her other chronic concern.

## 2018-03-27 ENCOUNTER — Telehealth: Payer: Self-pay | Admitting: Physician Assistant

## 2018-03-27 NOTE — Telephone Encounter (Signed)
Paged by answering service. Patient having bilateral eye pressure and L blurred eye. No fever, cough, congestion. Advised to hold ASA. F/u with eye doc on Monday. If still issue she will call us for office visit.

## 2018-03-29 DIAGNOSIS — H04123 Dry eye syndrome of bilateral lacrimal glands: Secondary | ICD-10-CM | POA: Diagnosis not present

## 2018-04-01 DIAGNOSIS — J3081 Allergic rhinitis due to animal (cat) (dog) hair and dander: Secondary | ICD-10-CM | POA: Diagnosis not present

## 2018-04-01 DIAGNOSIS — J4520 Mild intermittent asthma, uncomplicated: Secondary | ICD-10-CM | POA: Diagnosis not present

## 2018-04-01 DIAGNOSIS — J301 Allergic rhinitis due to pollen: Secondary | ICD-10-CM | POA: Diagnosis not present

## 2018-04-01 DIAGNOSIS — J3089 Other allergic rhinitis: Secondary | ICD-10-CM | POA: Diagnosis not present

## 2018-04-09 DIAGNOSIS — R8271 Bacteriuria: Secondary | ICD-10-CM | POA: Diagnosis not present

## 2018-04-09 DIAGNOSIS — R310 Gross hematuria: Secondary | ICD-10-CM | POA: Diagnosis not present

## 2018-04-09 DIAGNOSIS — N3020 Other chronic cystitis without hematuria: Secondary | ICD-10-CM | POA: Diagnosis not present

## 2018-04-12 DIAGNOSIS — N898 Other specified noninflammatory disorders of vagina: Secondary | ICD-10-CM | POA: Diagnosis not present

## 2018-04-12 DIAGNOSIS — N760 Acute vaginitis: Secondary | ICD-10-CM | POA: Diagnosis not present

## 2018-04-12 DIAGNOSIS — Z91018 Allergy to other foods: Secondary | ICD-10-CM | POA: Diagnosis not present

## 2018-04-12 DIAGNOSIS — N390 Urinary tract infection, site not specified: Secondary | ICD-10-CM | POA: Diagnosis not present

## 2018-05-04 DIAGNOSIS — R310 Gross hematuria: Secondary | ICD-10-CM | POA: Diagnosis not present

## 2018-05-10 DIAGNOSIS — H5202 Hypermetropia, left eye: Secondary | ICD-10-CM | POA: Diagnosis not present

## 2018-05-10 DIAGNOSIS — H04123 Dry eye syndrome of bilateral lacrimal glands: Secondary | ICD-10-CM | POA: Diagnosis not present

## 2018-05-10 DIAGNOSIS — H10413 Chronic giant papillary conjunctivitis, bilateral: Secondary | ICD-10-CM | POA: Diagnosis not present

## 2018-05-23 DIAGNOSIS — N390 Urinary tract infection, site not specified: Secondary | ICD-10-CM | POA: Diagnosis not present

## 2018-06-03 ENCOUNTER — Encounter: Payer: Self-pay | Admitting: Physician Assistant

## 2018-06-03 ENCOUNTER — Ambulatory Visit (INDEPENDENT_AMBULATORY_CARE_PROVIDER_SITE_OTHER): Payer: BLUE CROSS/BLUE SHIELD | Admitting: Physician Assistant

## 2018-06-03 VITALS — Ht 67.0 in | Wt 154.0 lb

## 2018-06-03 DIAGNOSIS — R1011 Right upper quadrant pain: Secondary | ICD-10-CM | POA: Diagnosis not present

## 2018-06-03 DIAGNOSIS — K660 Peritoneal adhesions (postprocedural) (postinfection): Secondary | ICD-10-CM

## 2018-06-03 DIAGNOSIS — K200 Eosinophilic esophagitis: Secondary | ICD-10-CM | POA: Diagnosis not present

## 2018-06-03 DIAGNOSIS — K5909 Other constipation: Secondary | ICD-10-CM

## 2018-06-03 NOTE — Patient Instructions (Signed)
You have been scheduled for an abdominal ultrasound at [facility] (1st floor of hospital) on Monday 06-14-2018 at 10:00 am. Please arrive at 10:15 AM  to your appointment for registration. Make certain not to have anything to eat or drink after midnight to your appointment. Should you need to reschedule your appointment, please contact radiology at [phone]. This test typically takes about 30 minutes to perform.  We will call you when the October schedule is in. George Alcantar P. CMA will call you and have you come in to sign the paperwork and give you instructions for the colonoscopy.  We have given you Linzess 72 mcg samples for constipation.  Call us back and let us know if this helps . If so we can send a prescription.    If you are age 88 or younger, your body mass index should be between 19-25. Your Body mass index is 24.12 kg/m. If this is out of the aformentioned range listed, please consider follow up with your Primary Care Provider.
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
## 2018-06-07 ENCOUNTER — Telehealth: Payer: Self-pay | Admitting: *Deleted

## 2018-06-07 NOTE — Telephone Encounter (Signed)
Received records we requested on 06-03-2018 from [facility]. Records given to [facility] PA on 06-07-2018.

## 2018-06-08 ENCOUNTER — Telehealth: Payer: Self-pay | Admitting: *Deleted

## 2018-06-08 NOTE — Telephone Encounter (Signed)
CT Urogram from [facility] stamped and sent to be scanned on 06-08-2018.

## 2018-06-14 ENCOUNTER — Ambulatory Visit (HOSPITAL_COMMUNITY)
Admission: EM | Admit: 2018-06-14 | Discharge: 2018-06-14 | Disposition: A | Discharge status: Home / Self Care | Payer: BLUE CROSS/BLUE SHIELD | Attending: Family Medicine | Admitting: Family Medicine

## 2018-06-14 ENCOUNTER — Encounter (HOSPITAL_COMMUNITY): Payer: Self-pay | Admitting: Emergency Medicine

## 2018-06-14 ENCOUNTER — Ambulatory Visit (HOSPITAL_COMMUNITY)
Admission: RE | Admit: 2018-06-14 | Discharge: 2018-06-14 | Disposition: A | Discharge status: Home / Self Care | Payer: BLUE CROSS/BLUE SHIELD | Source: Ambulatory Visit | Attending: Physician Assistant | Admitting: Physician Assistant

## 2018-06-14 DIAGNOSIS — M25531 Pain in right wrist: Secondary | ICD-10-CM | POA: Diagnosis present

## 2018-06-14 DIAGNOSIS — K7689 Other specified diseases of liver: Secondary | ICD-10-CM | POA: Insufficient documentation

## 2018-06-14 DIAGNOSIS — M79601 Pain in right arm: Secondary | ICD-10-CM

## 2018-06-14 DIAGNOSIS — K5909 Other constipation: Secondary | ICD-10-CM

## 2018-06-14 DIAGNOSIS — K660 Peritoneal adhesions (postprocedural) (postinfection): Secondary | ICD-10-CM

## 2018-06-14 DIAGNOSIS — K8020 Calculus of gallbladder without cholecystitis without obstruction: Secondary | ICD-10-CM | POA: Diagnosis not present

## 2018-06-14 DIAGNOSIS — R1011 Right upper quadrant pain: Secondary | ICD-10-CM

## 2018-06-14 NOTE — ED Triage Notes (Signed)
PT has swelling in left arm for 3-4 days. PT sees ortho tomorrow. No known injury. PT had a bad manicure last week. Fatigue today. PT had open heart surgery 1.5 years ago and is being cautious. PT denies chest pain, neck pain, back pain, SOB.

## 2018-06-14 NOTE — ED Provider Notes (Signed)
[place]    CSN: [id number] Arrival date & time: 06/14/18  1737     History   Chief Complaint Chief Complaint  Patient presents with  . Arm Injury    HPI Emily Joseph is a 47 y.o. female.  Patient has not injured her arm but she has some soft tissue swelling in the forearm.  Symptoms are vague.  She had a manicure done in feels like it was done improperly but there is no evidence of infection around the nails such as paronychia etc.  She has no obvious arthritic symptoms.  She did have gallbladder ultrasound today that shows some small gallstones.   HPI  Past Medical History:  Diagnosis Date  . Adenomyosis    not papanicolaou smear of cervix and cervical HPV  . Anxiety 06/08/2016   -about her health and about taking medications  . Arthritis    ?  Marland Kitchen Asthma   . 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 Chronic fatigue 06/08/2016   -eval with rheum x2, neurology, gastroenterology  . Chronic pain 06/08/2016   -all over her whole life; joints, muscles, head -numerous evaluations, Dr. Trudie Reed in 2017, [facility] -seeing Dr. Jaynee Eagles, Neurologist for back pain with radicular symptoms  . Complication of anesthesia    "with epidural bp bottoms out"  . Dysrhythmia    fast hr  . Eosinophilic esophagitis    Sees Dr. Carlean Purl  . Fibromyalgia   . GERD (gastroesophageal reflux disease)   . Heart murmur   . History of pneumonia    when pt was pregnant  . Iron deficiency anemia due to chronic blood loss    Menorrhagia, s/p complete hysterectomy, ovaries remain hx  . Irritable bowel syndrome 01/27/2013   Dr Carlean Purl 02/2016   . Microhematuria   . Mouth problem    failed gum graft 1/16  . Nasal airway abnormality   . Nasal obstruction 06/26/2015   Seen by ENT 06-26-15.  May need sleep study to see if having apnea    . Palpitations   . 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  Shortness of breath dyspnea   . SUI (stress urinary incontinence, female)   . Syncope    with last child with epideral  . UTI (urinary tract infection) 07/14/2016   >=100,000 COLONIES/mL KLEBSIELLA PNEUMONIA    Patient Active Problem List   Diagnosis Date Noted  . Atrial flutter with rapid ventricular response ([place]) 10/27/2016  . UTI (urinary tract infection) 07/14/2016  . 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 Chronic pain 06/08/2016  . Chronic fatigue 06/08/2016  . GERD (gastroesophageal reflux disease) with esophagitis 06/08/2016  . Palpitations 06/08/2016  . Anxiety 06/08/2016  . Multiple environmental allergies 03/04/2016  . Multiple food allergies 03/04/2016  . Nasal obstruction 06/26/2015  . Irritable bowel syndrome 01/27/2013  . Fibromyalgia 01/07/2007    Past Surgical History:  Procedure Laterality Date  . APPENDECTOMY    . BALLOON DILATION N/A 02/09/2013   Procedure: BALLOON DILATION;  Surgeon: Arta Silence, MD;  Location: [facility];  Service: Endoscopy;  Laterality: N/A;  . BILATERAL SALPINGECTOMY N/A 06/14/2014   Procedure: BILATERAL SALPINGECTOMY;  Surgeon: Allena Katz, MD;  Location: [facility];  Service: Gynecology;  Laterality: N/A;  . COLONOSCOPY WITH PROPOFOL N/A 02/09/2013   Procedure: COLONOSCOPY  WITH PROPOFOL;  Surgeon: Arta Silence, MD;  [facility];  Service: Endoscopy;  Laterality: N/A;  need ultra thin colon scope  . ESOPHAGOGASTRODUODENOSCOPY (EGD) WITH PROPOFOL N/A 02/09/2013   Procedure: ESOPHAGOGASTRODUODENOSCOPY (EGD) WITH PROPOFOL;  Surgeon: Arta Silence, MD;  [facility];  Service: Endoscopy;  Laterality: N/A;  . LAPAROSCOPIC ASSISTED VAGINAL HYSTERECTOMY Right 06/14/2014   Procedure: OPEN LAPAROSCOPIC ASSISTED VAGINAL HYSTERECTOMY;  Surgeon: Allena Katz, MD;  Location: [facility];  Service: Gynecology;  Laterality: Right;  . LYSIS OF ADHESION N/A 06/14/2014   Procedure: LYSIS OF ADHESION;  Surgeon:  Allena Katz, MD;  Location: [facility];  Service: Gynecology;  Laterality: N/A;  . MINIMALLY INVASIVE EXCISION OF ATRIAL MYXOMA Right 07/11/2016   Procedure: MINIMALLY INVASIVE RESECTION OF RIGHT ATRIAL LIPOMA WITH CLOSURE OF PATENT FORAMEN OVALE;  Surgeon: Rexene Alberts, MD;  Location: [facility];  Service: Open Heart Surgery;  Laterality: Right;  . OVARIAN CYST REMOVAL Right 1990  . TEE WITHOUT CARDIOVERSION N/A 07/11/2016   Procedure: TRANSESOPHAGEAL ECHOCARDIOGRAM (TEE);  Surgeon: Rexene Alberts, MD;  Location: [facility];  Service: Open Heart Surgery;  Laterality: N/A;    OB History    Gravida  4   Para  4   Term  4   Preterm      AB      Living  4     SAB      TAB      Ectopic      Multiple      Live Births               Home Medications    Prior to Admission medications   Medication Sig Start Date End Date Taking? Authorizing Provider  albuterol (PROVENTIL HFA;VENTOLIN HFA) 108 (90 Base) MCG/ACT inhaler Inhale 2 puffs into the lungs every 6 (six) hours as needed (asthmatic bronchitis).    [provider]  aspirin EC 81 MG EC tablet Take 1 tablet (81 mg total) by mouth daily. Patient taking differently: Take 81 mg by mouth every evening.  10/29/16   Cheryln Manly, NP  cephALEXin (KEFLEX) 500 MG capsule Take 1 capsule (500 mg total) by mouth 4 (four) times daily. 03/12/18   Ripley Fraise, MD  docusate sodium (COLACE) 100 MG capsule Take 100 mg by mouth daily as needed for mild constipation.    [provider]  EPINEPHrine (EPIPEN 2-PAK) 0.3 mg/0.3 mL IJ SOAJ injection Inject 0.3 mg into the muscle once as needed (severe allergic reaction).    [provider]  fluticasone (FLONASE) 50 MCG/ACT nasal spray Place 2 sprays into both nostrils daily. 01/22/17   Lucretia Kern, DO  metoprolol tartrate (LOPRESSOR) 25 MG tablet Take 0.5 tablets (12.5 mg total) by mouth 2 (two) times daily. 11/25/17   Lelon Perla, MD  Pediatric  Multivit-Minerals-C (CHILDRENS VITAMINS PO) Take 1 tablet by mouth 2 (two) times a week.    [provider]  Polyethyl Glycol-Propyl Glycol (SYSTANE OP) Place 1 drop into both eyes at bedtime.    [provider]  Polyethylene Glycol 3350 (MIRALAX PO) Take 17 g by mouth daily as needed (for constipation).     [provider]    Family History Family History  Problem Relation Age of Onset  . Cancer Mother   . CAD Father        MI at age 28  . Stroke Father   . Cancer Maternal Grandfather   .  Stroke Paternal Grandfather   . Kidney disease Paternal Grandfather     Social History Social History   Tobacco Use  . Smoking status: Never Smoker  . Smokeless tobacco: Never Used  Substance Use Topics  . Alcohol use: Yes    Alcohol/week: 0.0 oz    Comment: 3 glasses wine per week non recently  . Drug use: No     Allergies   Avocado; Macadamia nut oil; Mango flavor; Other; Peanuts [peanut oil]; Ciprofloxacin; Demerol [meperidine]; Iodinated diagnostic agents; Macrobid [nitrofurantoin]; Latex; Dulcolax [bisacodyl]; Sulfamethoxazole; and Tape   Review of Systems Review of Systems  Constitutional: Negative for chills and fever.  HENT: Negative for ear pain and sore throat.   Eyes: Negative for pain and visual disturbance.  Respiratory: Negative for cough and shortness of breath.   Cardiovascular: Negative for chest pain and palpitations.  Gastrointestinal: Negative for abdominal pain and vomiting.  Genitourinary: Negative for dysuria and hematuria.  Musculoskeletal: Positive for myalgias. Negative for arthralgias and back pain.  Skin: Negative for color change and rash.  Neurological: Negative for seizures and syncope.  All other systems reviewed and are negative.    Physical Exam Triage Vital Signs ED Triage Vitals  Enc Vitals Group     BP 06/14/18 1807 115/78     Pulse Rate 06/14/18 1807 83     Resp 06/14/18 1807 16     Temp 06/14/18 1807 98.6 F  (37 C)     Temp Source 06/14/18 1807 Oral     SpO2 06/14/18 1807 100 %     Weight --      Height --      Head Circumference --      Peak Flow --      Pain Score 06/14/18 1809 3     Pain Loc --      Pain Edu? --      Excl. in [place]? --    No data found.  Updated Vital Signs BP 115/78   Pulse 83   Temp 98.6 F (37 C) (Oral)   Resp 16   LMP 05/20/2014 (Approximate)   SpO2 100%   Visual Acuity Right Eye Distance:   Left Eye Distance:   Bilateral Distance:    Right Eye Near:   Left Eye Near:    Bilateral Near:     Physical Exam  Constitutional: She appears well-developed and well-nourished.  Musculoskeletal:  Right arm: Elbow and wrist have normal range of motion and normal strength.  There is a suggestion of some soft tissue swelling.  There is no erythema.     UC Treatments / Results  Labs (all labs ordered are listed, but only abnormal results are displayed) Labs Reviewed - No data to display  EKG None  Radiology US Abdomen Complete  Result Date: 06/14/2018 CLINICAL DATA:  47 year old female with right upper quadrant abdominal pain and chronic constipation. Subsequent encounter. EXAM: ABDOMEN ULTRASOUND COMPLETE COMPARISON:  03/18/2018 CT. FINDINGS: Gallbladder: Mobile gallstones measuring up to 9.9 mm. No gallbladder wall thickening. Patient not tender over the gallbladder during scanning per sonographer. Common bile duct: Diameter: 4.6 mm. Liver: Echogenicity within normal limits. Posterior right lobe liver 1.8 x 1.3 x 1.5 cm cyst with minimal septation. Portal vein is patent on color Doppler imaging with normal direction of blood flow towards the liver. IVC: No abnormality visualized. Pancreas: Visualized portion unremarkable. Spleen: Size and appearance within normal limits. Right Kidney: Length: 11.2 cm. Echogenicity within normal limits. No mass or hydronephrosis visualized.  Left Kidney: Length: 10.9 cm. Echogenicity within normal limits. No mass or hydronephrosis  visualized. Abdominal aorta: No aneurysm visualized. Other findings: None. IMPRESSION: 1. Mobile gallstones measuring up to 9.9 mm. No gallbladder wall thickening. Patient not tender over the gallbladder during scanning per sonographer. 2. 1.8 cm minimally septated cyst posterior aspect right lobe of liver. 3. Remainder of ultrasound unremarkable. Electronically Signed   By: Genia Del M.D.   On: 06/14/2018 11:18    Procedures Procedures (including critical care time)  Medications Ordered in UC Medications - No data to display  Initial Impression / Assessment and Plan / UC Course  I have reviewed the triage vital signs and the nursing notes.  Pertinent labs & imaging results that were available during my care of the patient were reviewed by me and considered in my medical decision making (see chart for details).      Final Clinical Impressions(s) / UC Diagnoses  Right arm pain and swelling.  There is no evidence of acute infection that needs to be treated tonight.  She has follow-up tomorrow with orthopedics. Final diagnoses:  None   Discharge Instructions   None    ED Prescriptions    None     Controlled Substance Prescriptions [place] Controlled Substance Registry consulted? No   Wardell Honour, MD 06/14/18 1840

## 2018-06-15 ENCOUNTER — Ambulatory Visit (INDEPENDENT_AMBULATORY_CARE_PROVIDER_SITE_OTHER): Payer: BLUE CROSS/BLUE SHIELD | Admitting: Physician Assistant

## 2018-06-15 ENCOUNTER — Encounter (INDEPENDENT_AMBULATORY_CARE_PROVIDER_SITE_OTHER): Payer: Self-pay | Admitting: Physician Assistant

## 2018-06-15 DIAGNOSIS — M25531 Pain in right wrist: Secondary | ICD-10-CM

## 2018-06-15 NOTE — Progress Notes (Signed)
Office Visit Note   Patient: Emily Joseph           Date of Birth: 1971-03-10           MRN: [id number] Visit Date: 06/15/2018              Requested by: Lucretia Kern, DO [place], [place] [address] PCP: Lucretia Kern, DO   Assessment & Plan: Visit Diagnoses:  1. Pain in right wrist     Plan: Impression is wrist extensor tendinitis involving the second dorsal compartment.  Given the location of her pain there may be some element of intersection syndrome. -She has Voltaren gel at home.  I recommended she use this after clearing with her cardiologist given her any cardiac history - We will provide with her splint for immobilization and comfort. - Ice 15 minutes 3-4 times per day - Recommend she follow-up in 4 to 6 weeks if she is not having any improvement  Follow-Up Instructions: Return if symptoms worsen or fail to improve.   Orders:  No orders of the defined types were placed in this encounter.  No orders of the defined types were placed in this encounter.     Procedures: No procedures performed   Clinical Data: No additional findings.   Subjective: Chief Complaint  Patient presents with  . Right Wrist - Pain, Edema    HPI  Patient is a 47 year old female who presents with 2 days of dorsal right wrist pain.  Patient states it started randomly following her drive home from the beach this past weekend.  She denies any known mechanism of injury.  She reports swelling around the area of the distal radius.  She reports tenderness over the dorsal wrist but no focal tenderness.  She states the pain is better with rest and increases with movement.  She denies any erythema or bruising.  She does report some swelling which has gone down at this time.  She denies any numbness or tingling in the fingers.  No weakness in the hand.  No other skin changes.  Review of Systems See HPI  Objective: Vital Signs: LMP 05/20/2014 (Approximate)   Physical  Exam  GEN: Awake, alert, NAD Pulm: breathing unlabored  MSK: Right Wrist: Inspection: Very slight swelling over the aspect of the distal radius compared to the left wrist.  No erythema or bruising Palpation: Mild tenderness palpation along the second dorsal compartment in the area of intersection with the first dorsal compartment.  There is also some mild tenderness of the patient over the lateral epicondyles ROM: Full range of motion of the elbow wrist and fingers Strength: 5/5 strength in all planes.  Patient notes some discomfort with resisted wrist extension Neurovascular: N/V intact Special tests: Slight discomfort with Finkelstein's.  Negative Tinel's at the carpal tunnel  Right wrist: Inspection: No obvious deformity.  No erythema bruising or swelling. Palpation: No tenderness palpation ROM: Full range of motion of the elbow wrist and fingers Strength: 5/5 strength Neurovascular: N/V intact Special tests: Negative Finkelstein's        Specialty Comments:  No specialty comments available.  Imaging: No results found.   PMFS History: Patient Active Problem List   Diagnosis Date Noted  . Pain in right wrist 06/14/2018  . Atrial flutter with rapid ventricular response ([place]) 10/27/2016  . UTI (urinary tract infection) 07/14/2016  . 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 Chronic pain 06/08/2016  . Chronic fatigue  06/08/2016  . GERD (gastroesophageal reflux disease) with esophagitis 06/08/2016  . Palpitations 06/08/2016  . Anxiety 06/08/2016  . Multiple environmental allergies 03/04/2016  . Multiple food allergies 03/04/2016  . Nasal obstruction 06/26/2015  . Irritable bowel syndrome 01/27/2013  . Fibromyalgia 01/07/2007   Past Medical History:  Diagnosis Date  . Adenomyosis    not papanicolaou smear of cervix and cervical HPV  . Anxiety 06/08/2016   -about her health and about taking medications  . Arthritis    ?  Marland Kitchen Asthma   . Atrial  mass    4 cm mass in right atrium c/w benign cardiac lipoma  . Chronic fatigue 06/08/2016   -eval with rheum x2, neurology, gastroenterology  . Chronic pain 06/08/2016   -all over her whole life; joints, muscles, head -numerous evaluations, Dr. Trudie Reed in 2017, [facility] -seeing Dr. Jaynee Eagles, Neurologist for back pain with radicular symptoms  . Complication of anesthesia    "with epidural bp bottoms out"  . Dysrhythmia    fast hr  . Eosinophilic esophagitis    Sees Dr. Carlean Purl  . Fibromyalgia   . GERD (gastroesophageal reflux disease)   . Heart murmur   . History of pneumonia    when pt was pregnant  . Iron deficiency anemia due to chronic blood loss    Menorrhagia, s/p complete hysterectomy, ovaries remain hx  . Irritable bowel syndrome 01/27/2013   Dr Carlean Purl 02/2016   . Microhematuria   . Mouth problem    failed gum graft 1/16  . Nasal airway abnormality   . Nasal obstruction 06/26/2015   Seen by ENT 06-26-15.  May need sleep study to see if having apnea    . Palpitations   . 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 Shortness of breath dyspnea   . SUI (stress urinary incontinence, female)   . Syncope    with last child with epideral  . UTI (urinary tract infection) 07/14/2016   >=100,000 COLONIES/mL KLEBSIELLA PNEUMONIA    Family History  Problem Relation Age of Onset  . Cancer Mother   . CAD Father        MI at age 55  . Stroke Father   . Cancer Maternal Grandfather   . Stroke Paternal Grandfather   . Kidney disease Paternal Grandfather     Past Surgical History:  Procedure Laterality Date  . APPENDECTOMY    . BALLOON DILATION N/A 02/09/2013   Procedure: BALLOON DILATION;  Surgeon: Arta Silence, MD;  [facility];  Service: Endoscopy;  Laterality: N/A;  . BILATERAL SALPINGECTOMY N/A 06/14/2014   Procedure: BILATERAL SALPINGECTOMY;  Surgeon: Allena Katz, MD;  Location: [facility];  Service:  Gynecology;  Laterality: N/A;  . COLONOSCOPY WITH PROPOFOL N/A 02/09/2013   Procedure: COLONOSCOPY WITH PROPOFOL;  Surgeon: Arta Silence, MD;  [facility];  Service: Endoscopy;  Laterality: N/A;  need ultra thin colon scope  . ESOPHAGOGASTRODUODENOSCOPY (EGD) WITH PROPOFOL N/A 02/09/2013   Procedure: ESOPHAGOGASTRODUODENOSCOPY (EGD) WITH PROPOFOL;  Surgeon: Arta Silence, MD;  [facility];  Service: Endoscopy;  Laterality: N/A;  . LAPAROSCOPIC ASSISTED VAGINAL HYSTERECTOMY Right 06/14/2014   Procedure: OPEN LAPAROSCOPIC ASSISTED VAGINAL HYSTERECTOMY;  Surgeon: Allena Katz, MD;  Location: [facility];  Service: Gynecology;  Laterality: Right;  . LYSIS OF ADHESION N/A 06/14/2014   Procedure: LYSIS OF ADHESION;  Surgeon: Allena Katz, MD;  Location:  [facility];  Service: Gynecology;  Laterality: N/A;  . MINIMALLY INVASIVE EXCISION OF ATRIAL MYXOMA Right 07/11/2016   Procedure: MINIMALLY INVASIVE RESECTION OF RIGHT ATRIAL LIPOMA WITH CLOSURE OF PATENT FORAMEN OVALE;  Surgeon: Rexene Alberts, MD;  Location: [facility];  Service: Open Heart Surgery;  Laterality: Right;  . OVARIAN CYST REMOVAL Right 1990  . TEE WITHOUT CARDIOVERSION N/A 07/11/2016   Procedure: TRANSESOPHAGEAL ECHOCARDIOGRAM (TEE);  Surgeon: Rexene Alberts, MD;  Location: [facility];  Service: Open Heart Surgery;  Laterality: N/A;   Social History   Occupational History  . Not on file  Tobacco Use  . Smoking status: Never Smoker  . Smokeless tobacco: Never Used  Substance and Sexual Activity  . Alcohol use: Yes    Alcohol/week: 0.0 oz    Comment: 3 glasses wine per week non recently  . Drug use: No  . Sexual activity: Not on file

## 2018-06-16 DIAGNOSIS — N952 Postmenopausal atrophic vaginitis: Secondary | ICD-10-CM | POA: Diagnosis not present

## 2018-06-16 DIAGNOSIS — N3021 Other chronic cystitis with hematuria: Secondary | ICD-10-CM | POA: Diagnosis not present

## 2018-06-16 DIAGNOSIS — N393 Stress incontinence (female) (male): Secondary | ICD-10-CM | POA: Diagnosis not present

## 2018-06-16 DIAGNOSIS — R102 Pelvic and perineal pain: Secondary | ICD-10-CM | POA: Diagnosis not present

## 2018-06-17 ENCOUNTER — Telehealth: Payer: Self-pay | Admitting: Physician Assistant

## 2018-06-17 NOTE — Telephone Encounter (Signed)
Please advise. Thank you

## 2018-06-21 ENCOUNTER — Encounter: Payer: Self-pay | Admitting: Internal Medicine

## 2018-06-21 ENCOUNTER — Ambulatory Visit (INDEPENDENT_AMBULATORY_CARE_PROVIDER_SITE_OTHER): Payer: BLUE CROSS/BLUE SHIELD | Admitting: Internal Medicine

## 2018-06-21 VITALS — BP 118/72 | HR 76 | Ht 67.0 in | Wt 150.2 lb

## 2018-06-21 DIAGNOSIS — R1011 Right upper quadrant pain: Secondary | ICD-10-CM | POA: Diagnosis not present

## 2018-06-21 DIAGNOSIS — K581 Irritable bowel syndrome with constipation: Secondary | ICD-10-CM

## 2018-06-21 DIAGNOSIS — G8929 Other chronic pain: Secondary | ICD-10-CM

## 2018-06-21 DIAGNOSIS — K8020 Calculus of gallbladder without cholecystitis without obstruction: Secondary | ICD-10-CM | POA: Diagnosis not present

## 2018-06-21 DIAGNOSIS — M6289 Other specified disorders of muscle: Secondary | ICD-10-CM

## 2018-06-21 DIAGNOSIS — N816 Rectocele: Secondary | ICD-10-CM

## 2018-06-21 NOTE — Patient Instructions (Addendum)
Please try the Linzess samples you have and let us know if that helps.  We are giving you a handout with multiple web addresses to check into for your digestive health.   Please have them address you constipation issue when you go for your pelvic floor P.T.   Please follow up with Dr Carlean Purl in late October. Call us back in a couple of weeks to set this up.    I appreciate the opportunity to care for you. Silvano Rusk, MD, [facility]
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
## 2018-06-24 DIAGNOSIS — M62838 Other muscle spasm: Secondary | ICD-10-CM | POA: Diagnosis not present

## 2018-06-24 DIAGNOSIS — R102 Pelvic and perineal pain: Secondary | ICD-10-CM | POA: Diagnosis not present

## 2018-06-24 DIAGNOSIS — K5909 Other constipation: Secondary | ICD-10-CM | POA: Diagnosis not present

## 2018-06-24 DIAGNOSIS — M6281 Muscle weakness (generalized): Secondary | ICD-10-CM | POA: Diagnosis not present

## 2018-06-28 DIAGNOSIS — M6281 Muscle weakness (generalized): Secondary | ICD-10-CM | POA: Diagnosis not present

## 2018-06-28 DIAGNOSIS — K5909 Other constipation: Secondary | ICD-10-CM | POA: Diagnosis not present

## 2018-06-28 DIAGNOSIS — R102 Pelvic and perineal pain: Secondary | ICD-10-CM | POA: Diagnosis not present

## 2018-06-28 DIAGNOSIS — M62838 Other muscle spasm: Secondary | ICD-10-CM | POA: Diagnosis not present

## 2018-07-09 ENCOUNTER — Telehealth: Payer: Self-pay | Admitting: Cardiology

## 2018-07-09 NOTE — Telephone Encounter (Signed)
New message   *STAT* If patient is at the pharmacy, call can be transferred to refill team.   1. Which medications need to be refilled? (please list name of each medication and dose if known) metoprolol tartrate (LOPRESSOR) 25 MG tablet  2. Which pharmacy/location (including street and city if local pharmacy) is medication to be sent to? [facility] [place], [place] - [address] AT [place]  3. Do they need a 30 day or 90 day supply? [place]

## 2018-07-13 MED ORDER — METOPROLOL TARTRATE 25 MG PO TABS: 12.5000 mg | ORAL_TABLET | Freq: Two times a day (BID) | ORAL | 3 refills | Status: DC

## 2018-07-13 NOTE — Telephone Encounter (Signed)
Refill sent to the pharmacy electronically.  

## 2018-07-20 DIAGNOSIS — R102 Pelvic and perineal pain: Secondary | ICD-10-CM | POA: Diagnosis not present

## 2018-07-20 DIAGNOSIS — K5909 Other constipation: Secondary | ICD-10-CM | POA: Diagnosis not present

## 2018-07-20 DIAGNOSIS — M6281 Muscle weakness (generalized): Secondary | ICD-10-CM | POA: Diagnosis not present

## 2018-07-20 DIAGNOSIS — M62838 Other muscle spasm: Secondary | ICD-10-CM | POA: Diagnosis not present

## 2018-07-27 DIAGNOSIS — N6320 Unspecified lump in the left breast, unspecified quadrant: Secondary | ICD-10-CM | POA: Diagnosis not present

## 2018-07-28 ENCOUNTER — Other Ambulatory Visit: Payer: Self-pay | Admitting: Obstetrics and Gynecology

## 2018-07-28 DIAGNOSIS — N6320 Unspecified lump in the left breast, unspecified quadrant: Secondary | ICD-10-CM

## 2018-08-02 ENCOUNTER — Ambulatory Visit
Admission: RE | Admit: 2018-08-02 | Discharge: 2018-08-02 | Disposition: A | Discharge status: Home / Self Care | Payer: BLUE CROSS/BLUE SHIELD | Source: Ambulatory Visit | Attending: Obstetrics and Gynecology | Admitting: Obstetrics and Gynecology

## 2018-08-02 DIAGNOSIS — M6281 Muscle weakness (generalized): Secondary | ICD-10-CM | POA: Diagnosis not present

## 2018-08-02 DIAGNOSIS — R922 Inconclusive mammogram: Secondary | ICD-10-CM | POA: Diagnosis not present

## 2018-08-02 DIAGNOSIS — N6320 Unspecified lump in the left breast, unspecified quadrant: Secondary | ICD-10-CM

## 2018-08-02 DIAGNOSIS — N6489 Other specified disorders of breast: Secondary | ICD-10-CM | POA: Diagnosis not present

## 2018-08-02 DIAGNOSIS — M62838 Other muscle spasm: Secondary | ICD-10-CM | POA: Diagnosis not present

## 2018-08-02 DIAGNOSIS — R102 Pelvic and perineal pain: Secondary | ICD-10-CM | POA: Diagnosis not present

## 2018-08-02 DIAGNOSIS — K5909 Other constipation: Secondary | ICD-10-CM | POA: Diagnosis not present

## 2018-08-03 DIAGNOSIS — Z79899 Other long term (current) drug therapy: Secondary | ICD-10-CM | POA: Diagnosis not present

## 2018-08-03 DIAGNOSIS — Z130 Encounter for screening for diseases of the blood and blood-forming organs and certain disorders involving the immune mechanism: Secondary | ICD-10-CM | POA: Diagnosis not present

## 2018-08-03 DIAGNOSIS — Z13228 Encounter for screening for other metabolic disorders: Secondary | ICD-10-CM | POA: Diagnosis not present

## 2018-08-03 DIAGNOSIS — Z1329 Encounter for screening for other suspected endocrine disorder: Secondary | ICD-10-CM | POA: Diagnosis not present

## 2018-08-05 ENCOUNTER — Ambulatory Visit: Payer: BLUE CROSS/BLUE SHIELD | Admitting: Internal Medicine

## 2018-08-11 DIAGNOSIS — Z01419 Encounter for gynecological examination (general) (routine) without abnormal findings: Secondary | ICD-10-CM | POA: Diagnosis not present

## 2018-08-11 DIAGNOSIS — M6281 Muscle weakness (generalized): Secondary | ICD-10-CM | POA: Diagnosis not present

## 2018-08-11 DIAGNOSIS — R102 Pelvic and perineal pain: Secondary | ICD-10-CM | POA: Diagnosis not present

## 2018-08-11 DIAGNOSIS — Z6823 Body mass index (BMI) 23.0-23.9, adult: Secondary | ICD-10-CM | POA: Diagnosis not present

## 2018-08-11 DIAGNOSIS — M62838 Other muscle spasm: Secondary | ICD-10-CM | POA: Diagnosis not present

## 2018-08-11 DIAGNOSIS — K5909 Other constipation: Secondary | ICD-10-CM | POA: Diagnosis not present

## 2018-08-12 ENCOUNTER — Telehealth: Payer: Self-pay | Admitting: Cardiology

## 2018-08-12 NOTE — Telephone Encounter (Signed)
Pt called to report that she has been having increased fatigue and having trouble with her normal ADL's.. She denies any other cardiac symptoms such as chest pain.. She says her BP has been low such as 89/50 but her HR stays around 90 with everyday walking not  exercise walking.. She is anxious and really wants to talk with Dr. Stanford Breed. I offered her an appt for tomorrow afternoon and pt agrees and very appreciative..she is going to write down her BP and HR throughout today and tomorrow until her appt.. She has many stored in her apple watch. She reports that she has been hydrating well and would feel better seeing Dr. Stanford Breed about everything.

## 2018-08-12 NOTE — Telephone Encounter (Signed)
Pt called has a question about managing her medication and BP please give her a call back.

## 2018-08-13 ENCOUNTER — Encounter: Payer: Self-pay | Admitting: Cardiology

## 2018-08-13 ENCOUNTER — Ambulatory Visit (INDEPENDENT_AMBULATORY_CARE_PROVIDER_SITE_OTHER): Payer: BLUE CROSS/BLUE SHIELD | Admitting: Cardiology

## 2018-08-13 VITALS — BP 104/72 | HR 83 | Ht 67.5 in | Wt 153.0 lb

## 2018-08-13 DIAGNOSIS — R002 Palpitations: Secondary | ICD-10-CM

## 2018-08-13 DIAGNOSIS — I4892 Unspecified atrial flutter: Secondary | ICD-10-CM

## 2018-08-13 DIAGNOSIS — I5189 Other ill-defined heart diseases: Secondary | ICD-10-CM

## 2018-08-13 MED ORDER — METOPROLOL SUCCINATE ER 25 MG PO TB24: 12.5000 mg | ORAL_TABLET | Freq: Every day | ORAL | 3 refills | Status: DC
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
## 2018-08-13 NOTE — Patient Instructions (Signed)
Medication Instructions:  STOP METOPROLOL TARTRATE  START METOPROLOL SUCC ER 12.5 MG ONCE DAILY AT BEDTIME= 1/2 OF THE 25 MG TABLET ONCE DAILY AT [place]  If you need a refill on your cardiac medications before your next appointment, please call your pharmacy.   Lab work: If you have labs (blood work) drawn today and your tests are completely normal, you will receive your results only by: Marland Kitchen MyChart Message (if you have MyChart) OR . A paper copy in the mail If you have any lab test that is abnormal or we need to change your treatment, we will call you to review the results.  Follow-Up: At [facility], you and your health needs are our priority.  As part of our continuing mission to provide you with exceptional heart care, we have created designated Provider Care Teams.  These Care Teams include your primary Cardiologist (physician) and Advanced Practice Providers (APPs -  Physician Assistants and Nurse Practitioners) who all work together to provide you with the care you need, when you need it. You will need a follow up appointment in 1 years.  Please call our office 2 months in advance to schedule this appointment.  You may see DR Stanford Breed or one of the following Advanced Practice Providers on your designated Care Team:   Kerin Ransom, PA-C Roby Lofts, [place] . Sande Rives, PA-C

## 2018-08-23 DIAGNOSIS — N390 Urinary tract infection, site not specified: Secondary | ICD-10-CM | POA: Diagnosis not present

## 2018-08-23 DIAGNOSIS — N952 Postmenopausal atrophic vaginitis: Secondary | ICD-10-CM | POA: Diagnosis not present

## 2018-08-23 DIAGNOSIS — B9620 Unspecified Escherichia coli [E. coli] as the cause of diseases classified elsewhere: Secondary | ICD-10-CM | POA: Diagnosis not present

## 2018-08-23 DIAGNOSIS — N3021 Other chronic cystitis with hematuria: Secondary | ICD-10-CM | POA: Diagnosis not present

## 2018-08-23 DIAGNOSIS — N3000 Acute cystitis without hematuria: Secondary | ICD-10-CM | POA: Diagnosis not present

## 2018-08-23 DIAGNOSIS — R102 Pelvic and perineal pain: Secondary | ICD-10-CM | POA: Diagnosis not present

## 2018-08-30 ENCOUNTER — Telehealth: Payer: Self-pay | Admitting: Cardiology

## 2018-08-30 DIAGNOSIS — R002 Palpitations: Secondary | ICD-10-CM

## 2018-08-30 MED ORDER — METOPROLOL TARTRATE 25 MG PO TABS: 12.5000 mg | ORAL_TABLET | Freq: Two times a day (BID) | ORAL | 3 refills | Status: DC

## 2018-08-30 NOTE — Telephone Encounter (Signed)
New message    Pt c/o medication issue:  1. Name of Medication: metoprolol succinate   2. How are you currently taking this medication (dosage and times per day)? 25 mg  3. Are you having a reaction (difficulty breathing--STAT)? Pt states she had 7 sharp pains in her chest last night and felt like she couldn't breathe as well.   4. What is your medication issue? Pt states she switched back to the old medication Dr. Stanford Breed had changed at her last visit. She said since she switched she hasn't felt the way she did last night. She said the old medication is keeping her BP low.

## 2018-08-30 NOTE — Telephone Encounter (Signed)
Patient states that she was having random non sustained chest pain one time over the weekend that she feels is due to the change to metoprolol XL. Patient has since switched back to metoprolol tartrate and states that she is breathing a lot better and has no adverse symptoms. Patient is taking 12.5 mg twice daily. Please call if change needs to be made.

## 2018-08-30 NOTE — Telephone Encounter (Signed)
Spoke with pt, okay to stay on lopressor per dr Stanford Breed.

## 2018-09-09 DIAGNOSIS — M6281 Muscle weakness (generalized): Secondary | ICD-10-CM | POA: Diagnosis not present

## 2018-09-09 DIAGNOSIS — R102 Pelvic and perineal pain: Secondary | ICD-10-CM | POA: Diagnosis not present

## 2018-09-09 DIAGNOSIS — M62838 Other muscle spasm: Secondary | ICD-10-CM | POA: Diagnosis not present

## 2018-09-09 DIAGNOSIS — K5900 Constipation, unspecified: Secondary | ICD-10-CM | POA: Diagnosis not present

## 2018-09-16 DIAGNOSIS — M62838 Other muscle spasm: Secondary | ICD-10-CM | POA: Diagnosis not present

## 2018-09-16 DIAGNOSIS — M6281 Muscle weakness (generalized): Secondary | ICD-10-CM | POA: Diagnosis not present

## 2018-09-16 DIAGNOSIS — N393 Stress incontinence (female) (male): Secondary | ICD-10-CM | POA: Diagnosis not present

## 2018-09-16 DIAGNOSIS — R102 Pelvic and perineal pain: Secondary | ICD-10-CM | POA: Diagnosis not present

## 2018-09-27 DIAGNOSIS — R319 Hematuria, unspecified: Secondary | ICD-10-CM | POA: Diagnosis not present

## 2018-09-27 DIAGNOSIS — N760 Acute vaginitis: Secondary | ICD-10-CM | POA: Diagnosis not present

## 2018-09-27 DIAGNOSIS — N390 Urinary tract infection, site not specified: Secondary | ICD-10-CM | POA: Diagnosis not present

## 2018-10-11 DIAGNOSIS — N3021 Other chronic cystitis with hematuria: Secondary | ICD-10-CM | POA: Diagnosis not present

## 2018-10-11 DIAGNOSIS — R102 Pelvic and perineal pain: Secondary | ICD-10-CM | POA: Diagnosis not present

## 2018-10-14 DIAGNOSIS — R300 Dysuria: Secondary | ICD-10-CM | POA: Diagnosis not present

## 2018-10-14 DIAGNOSIS — R102 Pelvic and perineal pain: Secondary | ICD-10-CM | POA: Diagnosis not present

## 2018-10-14 DIAGNOSIS — M6281 Muscle weakness (generalized): Secondary | ICD-10-CM | POA: Diagnosis not present

## 2018-10-14 DIAGNOSIS — M62838 Other muscle spasm: Secondary | ICD-10-CM | POA: Diagnosis not present

## 2018-11-16 DIAGNOSIS — L905 Scar conditions and fibrosis of skin: Secondary | ICD-10-CM | POA: Diagnosis not present

## 2018-11-16 DIAGNOSIS — L821 Other seborrheic keratosis: Secondary | ICD-10-CM | POA: Diagnosis not present

## 2018-11-16 DIAGNOSIS — D2261 Melanocytic nevi of right upper limb, including shoulder: Secondary | ICD-10-CM | POA: Diagnosis not present

## 2018-11-16 DIAGNOSIS — D2262 Melanocytic nevi of left upper limb, including shoulder: Secondary | ICD-10-CM | POA: Diagnosis not present

## 2018-11-19 DIAGNOSIS — N393 Stress incontinence (female) (male): Secondary | ICD-10-CM | POA: Diagnosis not present

## 2018-11-19 DIAGNOSIS — M62838 Other muscle spasm: Secondary | ICD-10-CM | POA: Diagnosis not present

## 2018-11-19 DIAGNOSIS — M6281 Muscle weakness (generalized): Secondary | ICD-10-CM | POA: Diagnosis not present

## 2018-11-19 DIAGNOSIS — K5900 Constipation, unspecified: Secondary | ICD-10-CM | POA: Diagnosis not present

## 2018-12-03 ENCOUNTER — Telehealth: Payer: Self-pay | Admitting: Cardiology

## 2018-12-03 DIAGNOSIS — M6281 Muscle weakness (generalized): Secondary | ICD-10-CM | POA: Diagnosis not present

## 2018-12-03 DIAGNOSIS — K5909 Other constipation: Secondary | ICD-10-CM | POA: Diagnosis not present

## 2018-12-03 DIAGNOSIS — M62838 Other muscle spasm: Secondary | ICD-10-CM | POA: Diagnosis not present

## 2018-12-03 DIAGNOSIS — R102 Pelvic and perineal pain: Secondary | ICD-10-CM | POA: Diagnosis not present

## 2018-12-03 NOTE — Telephone Encounter (Signed)
New message     Pt c/o medication issue:  1. Name of Medication: aspirin EC 81 MG EC tablet  2. How are you currently taking this medication (dosage and times per day)? Once a day   3. Are you having a reaction (difficulty breathing--STAT)? no  4. What is your medication issue? Pt stated that she is having eye pressure and eye pain and thinks its due to the medication listed.

## 2018-12-03 NOTE — Telephone Encounter (Signed)
Called patient and notified of PharmD response. Patient verbalized understanding.

## 2018-12-03 NOTE — Telephone Encounter (Signed)
Called patient, she states that for the past week she has had increased eye pain, and eye issues. She remembers having this issue previously with this, and someone mentioning that it could be caused by her Aspirin, at which time she was not wanting to stop her Aspirin. But now the issue has returned and she states that yesterday the pain was so bad that she did not take her Aspirin, and it was better but she took it today and the pain has come back. Please advise if this is something associated with this?

## 2018-12-03 NOTE — Telephone Encounter (Signed)
Aspirin itself should not cause any type of eye pain or pressure.  However ocular bleeding might.  Would advise to hold aspirin and see ophthalmologist/optometrist.  If the pain/pressure is bad enough she should go to ED and not wait for appointment

## 2018-12-06 DIAGNOSIS — H04123 Dry eye syndrome of bilateral lacrimal glands: Secondary | ICD-10-CM | POA: Diagnosis not present

## 2018-12-07 ENCOUNTER — Encounter: Payer: Self-pay | Admitting: Cardiology

## 2018-12-15 DIAGNOSIS — N8111 Cystocele, midline: Secondary | ICD-10-CM | POA: Diagnosis not present

## 2018-12-15 DIAGNOSIS — M6281 Muscle weakness (generalized): Secondary | ICD-10-CM | POA: Diagnosis not present

## 2018-12-15 DIAGNOSIS — M62838 Other muscle spasm: Secondary | ICD-10-CM | POA: Diagnosis not present

## 2018-12-15 DIAGNOSIS — K5900 Constipation, unspecified: Secondary | ICD-10-CM | POA: Diagnosis not present

## 2018-12-17 ENCOUNTER — Telehealth: Payer: Self-pay | Admitting: Internal Medicine

## 2018-12-17 NOTE — Telephone Encounter (Signed)
Patient with a hx of chronic RUQ pain.  She is having worsening abdominal pain for the last few weeks.  She would like to make an appt to come in and see Dr. Carlean Purl .  She is scheduled for 12/28/18.  She will call back for worsening symptoms.

## 2018-12-17 NOTE — Telephone Encounter (Signed)
Pt reported that she woke up with pain on her right side.  4-5 on pain scale.  Please advise.

## 2018-12-28 ENCOUNTER — Other Ambulatory Visit (INDEPENDENT_AMBULATORY_CARE_PROVIDER_SITE_OTHER): Payer: BLUE CROSS/BLUE SHIELD

## 2018-12-28 ENCOUNTER — Ambulatory Visit (INDEPENDENT_AMBULATORY_CARE_PROVIDER_SITE_OTHER): Payer: BLUE CROSS/BLUE SHIELD | Admitting: Internal Medicine

## 2018-12-28 ENCOUNTER — Encounter: Payer: Self-pay | Admitting: Internal Medicine

## 2018-12-28 VITALS — BP 110/72 | HR 74 | Ht 67.5 in | Wt 150.0 lb

## 2018-12-28 DIAGNOSIS — M6289 Other specified disorders of muscle: Secondary | ICD-10-CM | POA: Diagnosis not present

## 2018-12-28 DIAGNOSIS — R1011 Right upper quadrant pain: Secondary | ICD-10-CM

## 2018-12-28 DIAGNOSIS — G8929 Other chronic pain: Secondary | ICD-10-CM

## 2018-12-28 DIAGNOSIS — K581 Irritable bowel syndrome with constipation: Secondary | ICD-10-CM

## 2018-12-28 LAB — CBC WITH DIFFERENTIAL/PLATELET
Basophils Absolute: 0.1 10*3/uL (ref 0.0–0.1)
Basophils Relative: 1.2 % (ref 0.0–3.0)
EOS PCT: 2.5 % (ref 0.0–5.0)
Eosinophils Absolute: 0.2 10*3/uL (ref 0.0–0.7)
HCT: 41 % (ref 36.0–46.0)
Hemoglobin: 14 g/dL (ref 12.0–15.0)
LYMPHS ABS: 1.5 10*3/uL (ref 0.7–4.0)
Lymphocytes Relative: 19.9 % (ref 12.0–46.0)
MCHC: 34 g/dL (ref 30.0–36.0)
MCV: 88.8 fl (ref 78.0–100.0)
MONOS PCT: 5.4 % (ref 3.0–12.0)
Monocytes Absolute: 0.4 10*3/uL (ref 0.1–1.0)
NEUTROS PCT: 71 % (ref 43.0–77.0)
Neutro Abs: 5.4 10*3/uL (ref 1.4–7.7)
Platelets: 214 10*3/uL (ref 150.0–400.0)
RBC: 4.62 Mil/uL (ref 3.87–5.11)
RDW: 13.2 % (ref 11.5–15.5)
WBC: 7.6 10*3/uL (ref 4.0–10.5)

## 2018-12-28 LAB — HEPATIC FUNCTION PANEL
ALBUMIN: 4.2 g/dL (ref 3.5–5.2)
ALK PHOS: 59 U/L (ref 39–117)
ALT: 11 U/L (ref 0–35)
AST: 10 U/L (ref 0–37)
Bilirubin, Direct: 0.1 mg/dL (ref 0.0–0.3)
Total Bilirubin: 0.7 mg/dL (ref 0.2–1.2)
Total Protein: 7.1 g/dL (ref 6.0–8.3)

## 2018-12-28 NOTE — Progress Notes (Signed)
LFT's and CBC ok My Chart message

## 2018-12-28 NOTE — Patient Instructions (Addendum)
  Your provider has requested that you go to the basement level for lab work before leaving today. Press "B" on the elevator. The lab is located at the first door on the left as you exit the elevator.    I appreciate the opportunity to care for you. Carl Gessner, MD, FACG 
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
## 2019-01-04 ENCOUNTER — Telehealth: Payer: Self-pay | Admitting: Cardiology

## 2019-01-04 NOTE — Telephone Encounter (Signed)
Would treat with ASA 81 mg daily Kirk Ruths

## 2019-01-04 NOTE — Telephone Encounter (Signed)
New Message   PT is calling because she is flying in an airplane soon and she said she is only taking 1/2 of tablet of metoprolol and is wondering if she should increase this for being anxious. Or what the Dr would recommend because she does get nervous to fly. She also is wondering if she can take benadryl.   Please call back

## 2019-01-04 NOTE — Telephone Encounter (Signed)
Pt called to report that she is flying out this afternoon with her family on a trip and she wants to know how much extra beta blockers she san take for her nerves... I advised her that we do not recommened that she use her beta blockers for anxiety control and to talk with her PMD Dr. Maudie Mercury for her recommendations for stress relief... Pt says she will call her and ask if she can take benadryl but flight is only 1 hour and she does not want to be sleepy...   She is also asking if Dr.Crenshaw would like her back on ASA 81mg ... she has been off of it since 12/03/18 since she had a bad episode of Eye Pain and we recommneded that she hold her ASA until after she sees her Opthamologist and she says she did and they could not find a cause and that she could go back on her ASA but she has been reluctant since she felt it had "built up" too much in her system and gave her problems with her heart rate, gall bladder and didn't make her feel well. But, she still wants to know if Dr. Stanford Breed wants her back on it and if so she will try it again.

## 2019-01-05 MED ORDER — ASPIRIN EC 81 MG PO TBEC: 81.0000 mg | DELAYED_RELEASE_TABLET | Freq: Every day | ORAL | 3 refills | Status: DC

## 2019-01-05 NOTE — Telephone Encounter (Signed)
Left message for patient of dr crenshaw's recommendations. 

## 2019-01-11 ENCOUNTER — Telehealth: Payer: Self-pay | Admitting: Cardiology

## 2019-01-11 NOTE — Telephone Encounter (Signed)
New Message   Pt is calling because she has questions about the aspirin and she has information she needs to give and she feels she is doing a lot better with out the Aspirin. She says her HR has been good and she is sleeping better. She says she is not having anymore eye pain.  The pt feels she is doing better without the Aspirin.   She wants to know if they want her back on the Aspirin because of her hospitalization or the heart monitor.  Please call back

## 2019-01-11 NOTE — Telephone Encounter (Signed)
Spoke with patient and she has multiple concerns regarding her ASA. First of all she wants to know why Dr Stanford Breed is prescribing ASA. She was having sharp/intense pains in her eyes about an hour after taking her ASA that has subsided since stopping. She has had her eyes checked and everything ok there. Per patient her heart rate was up and down while taking ASA, has improved and more regular since stopping. Plus she has chronic blood in urine, seeing Dr Jeffie Pollock tomorrow. Her memory has improved significantly. Overall patient states she is feeling much better since stopping ASA and would prefer to continue off but if Dr Stanford Breed feels she needs she will continue. Did explain that Dr Stanford Breed had recommended ASA 81 mg daily however she wants to make sure she is aware how much she has improved off. Will forward to Dr Stanford Breed for review

## 2019-01-12 DIAGNOSIS — N393 Stress incontinence (female) (male): Secondary | ICD-10-CM | POA: Diagnosis not present

## 2019-01-12 DIAGNOSIS — N952 Postmenopausal atrophic vaginitis: Secondary | ICD-10-CM | POA: Diagnosis not present

## 2019-01-12 DIAGNOSIS — R102 Pelvic and perineal pain: Secondary | ICD-10-CM | POA: Diagnosis not present

## 2019-01-12 DIAGNOSIS — R8271 Bacteriuria: Secondary | ICD-10-CM | POA: Diagnosis not present

## 2019-01-12 DIAGNOSIS — N3021 Other chronic cystitis with hematuria: Secondary | ICD-10-CM | POA: Diagnosis not present

## 2019-01-12 NOTE — Telephone Encounter (Signed)
Left message for patient of dr crenshaw's recommendations. 

## 2019-01-12 NOTE — Telephone Encounter (Signed)
Ok to DC ASA [employer]

## 2019-01-19 DIAGNOSIS — N393 Stress incontinence (female) (male): Secondary | ICD-10-CM | POA: Diagnosis not present

## 2019-01-19 DIAGNOSIS — M62838 Other muscle spasm: Secondary | ICD-10-CM | POA: Diagnosis not present

## 2019-01-19 DIAGNOSIS — K5900 Constipation, unspecified: Secondary | ICD-10-CM | POA: Diagnosis not present

## 2019-01-19 DIAGNOSIS — M6281 Muscle weakness (generalized): Secondary | ICD-10-CM | POA: Diagnosis not present

## 2019-02-02 DIAGNOSIS — E559 Vitamin D deficiency, unspecified: Secondary | ICD-10-CM | POA: Diagnosis not present

## 2019-02-02 DIAGNOSIS — R7309 Other abnormal glucose: Secondary | ICD-10-CM | POA: Diagnosis not present

## 2019-02-03 ENCOUNTER — Other Ambulatory Visit: Payer: Self-pay | Admitting: Cardiology

## 2019-02-08 DIAGNOSIS — N952 Postmenopausal atrophic vaginitis: Secondary | ICD-10-CM | POA: Diagnosis not present

## 2019-04-13 ENCOUNTER — Ambulatory Visit: Payer: Self-pay | Admitting: *Deleted

## 2019-04-13 NOTE — Telephone Encounter (Signed)
Pt reports ear "Pressure" of both ears, onset 1 week ago. States "Feels like they are full." Also reports sinus tenderness. States occasional dizziness when turning head to left, quick positional changes. Also states vision "Off" but has been using "readers."  Reports multiple allergies, states had been cleaning out "Very dusty room" when symptoms started. Pt  aware practice closed for day. Assured [place] would alert practice of symptoms for Dr. Julianne Rice review. Informed she would hear back from practice when open. Care advise given, instructed to go to ED/UC if dizziness worsens, severe headache, weakness. Please advsie regarding appt.  Pts# [phone] Reason for Disposition . Earache persists > 1 hour  Answer Assessment - Initial Assessment Questions 1. LOCATION: "Which ear is involved?"       Both ears, left worse than right 2. SENSATION: "Describe how the ear feels."     "Pressure" 3. ONSET:  "When did the ear symptoms start?"       1 week ago 4. PAIN: "Do you also have an earache?" If so, ask: "How bad is it?" (Scale 1-10; or mild, moderate, severe)     moderate 5. CAUSE: "What do you think is causing the ear congestion?"     Maybe infection 6. URI: "Do you have a runny nose or cough?"      Sinus tenderness above left eye 7. NASAL ALLERGIES: "Are there symptoms of hay fever, such as sneezing or a clear nasal discharge?"     yes  Protocols used: EAR - CONGESTION-A-AH

## 2019-04-14 ENCOUNTER — Other Ambulatory Visit: Payer: Self-pay

## 2019-04-14 ENCOUNTER — Telehealth: Payer: Self-pay | Admitting: *Deleted

## 2019-04-14 ENCOUNTER — Ambulatory Visit (INDEPENDENT_AMBULATORY_CARE_PROVIDER_SITE_OTHER): Payer: BC Managed Care – PPO | Admitting: Family Medicine

## 2019-04-14 DIAGNOSIS — R42 Dizziness and giddiness: Secondary | ICD-10-CM

## 2019-04-14 DIAGNOSIS — J309 Allergic rhinitis, unspecified: Secondary | ICD-10-CM

## 2019-04-14 DIAGNOSIS — H938X3 Other specified disorders of ear, bilateral: Secondary | ICD-10-CM | POA: Diagnosis not present

## 2019-04-14 NOTE — Telephone Encounter (Signed)
I called the pt and offered to schedule an appt as below.  Patient stated she will call back for an appt.

## 2019-04-14 NOTE — Telephone Encounter (Signed)
Please schedule virtual visit

## 2019-04-14 NOTE — Telephone Encounter (Signed)
-----   Message from Lucretia Kern, DO sent at 04/14/2019 11:55 AM EDT ----- -follow up in 1 week

## 2019-04-14 NOTE — Progress Notes (Signed)
Virtual Visit via Video Note  I connected with Emily Joseph  on 04/14/19 at 11:15 AM EDT by a video enabled telemedicine application and verified that I am speaking with the correct person using two identifiers.  Location patient: home Location provider:work or home office Persons participating in the virtual visit: patient, provider  I discussed the limitations of evaluation and management by telemedicine and the availability of in person appointments. The patient expressed understanding and agreed to proceed.   HPI:  Acute visit for sinus congestion. She has been doing a lot of cleaning the last few weeks. Has had some sinus congestion, allergy issues - she has many allergies, ears are full and has some pressure, has had some mild dizziness too. Some sinus pain frontal. Had a few episodes where she felt a little dizzy briefly when turned head to the left. No fevers, sick exposures, COVID19 exposures, SOB, cough, NVD.   ROS: See pertinent positives and negatives per HPI.  Past Medical History:  Diagnosis Date  . Adenomyosis    not papanicolaou smear of cervix and cervical HPV  . Anxiety 06/08/2016   -about her health and about taking medications  . Arthritis    ?  Marland Kitchen Asthma   . 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 Chronic fatigue 06/08/2016   -eval with rheum x2, neurology, gastroenterology  . Chronic pain 06/08/2016   -all over her whole life; joints, muscles, head -numerous evaluations, Dr. Trudie Reed in 2017, [place] [facility] -seeing Dr. Jaynee Eagles, Neurologist for back pain with radicular symptoms  . Complication of anesthesia    "with epidural bp bottoms out"  . Dysrhythmia    fast hr  . Eosinophilic esophagitis    Sees Dr. Carlean Purl  . Fibromyalgia   . GERD (gastroesophageal reflux disease)   . Heart murmur   . History of pneumonia    when pt was pregnant  . Iron deficiency anemia due to chronic blood loss    Menorrhagia, s/p complete hysterectomy, ovaries  remain hx  . Irritable bowel syndrome 01/27/2013   Dr Carlean Purl 02/2016   . Microhematuria   . Mouth problem    failed gum graft 1/16  . Nasal airway abnormality   . Nasal obstruction 06/26/2015   Seen by ENT 06-26-15.  May need sleep study to see if having apnea    . Palpitations   . Pelvic floor dysfunction in female   . 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 Shortness of breath dyspnea   . SUI (stress urinary incontinence, female)   . Syncope    with last child with epideral  . UTI (urinary tract infection) 07/14/2016   >=100,000 COLONIES/mL KLEBSIELLA PNEUMONIA    Past Surgical History:  Procedure Laterality Date  . APPENDECTOMY    . BALLOON DILATION N/A 02/09/2013   Procedure: BALLOON DILATION;  Surgeon: Arta Silence, MD;  Location: [facility];  Service: Endoscopy;  Laterality: N/A;  . BILATERAL SALPINGECTOMY N/A 06/14/2014   Procedure: BILATERAL SALPINGECTOMY;  Surgeon: Allena Katz, MD;  Location: [facility];  Service: Gynecology;  Laterality: N/A;  . BREAST BIOPSY    . COLONOSCOPY WITH PROPOFOL N/A 02/09/2013   Procedure: COLONOSCOPY WITH PROPOFOL;  Surgeon: Arta Silence, MD;  [facility];  Service: Endoscopy;  Laterality: N/A;  need ultra thin colon scope  . ESOPHAGOGASTRODUODENOSCOPY (EGD) WITH PROPOFOL N/A 02/09/2013  Procedure: ESOPHAGOGASTRODUODENOSCOPY (EGD) WITH PROPOFOL;  Surgeon: Arta Silence, MD;  [facility];  Service: Endoscopy;  Laterality: N/A;  . LAPAROSCOPIC ASSISTED VAGINAL HYSTERECTOMY Right 06/14/2014   Procedure: OPEN LAPAROSCOPIC ASSISTED VAGINAL HYSTERECTOMY;  Surgeon: Allena Katz, MD;  Location: [facility];  Service: Gynecology;  Laterality: Right;  . LYSIS OF ADHESION N/A 06/14/2014   Procedure: LYSIS OF ADHESION;  Surgeon: Allena Katz, MD;  Location: [facility];  Service: Gynecology;  Laterality: N/A;  . MINIMALLY INVASIVE EXCISION OF ATRIAL MYXOMA Right  07/11/2016   Procedure: MINIMALLY INVASIVE RESECTION OF RIGHT ATRIAL LIPOMA WITH CLOSURE OF PATENT FORAMEN OVALE;  Surgeon: Rexene Alberts, MD;  Location: [facility];  Service: Open Heart Surgery;  Laterality: Right;  . OVARIAN CYST REMOVAL Right 1990  . TEE WITHOUT CARDIOVERSION N/A 07/11/2016   Procedure: TRANSESOPHAGEAL ECHOCARDIOGRAM (TEE);  Surgeon: Rexene Alberts, MD;  Location: [facility];  Service: Open Heart Surgery;  Laterality: N/A;    Family History  Problem Relation Age of Onset  . Cancer Mother   . Breast cancer Mother   . CAD Father        MI at age 34  . Stroke Father   . Cancer Father        Bile Duct Cancer  . Cancer Maternal Grandfather   . Stroke Paternal Grandfather   . Kidney disease Paternal Grandfather   . Esophageal cancer Paternal Uncle     SOCIAL HX: see hpi   Current Outpatient Medications:  .  albuterol (PROVENTIL HFA;VENTOLIN HFA) 108 (90 Base) MCG/ACT inhaler, Inhale 2 puffs into the lungs every 6 (six) hours as needed (asthmatic bronchitis)., Disp: , Rfl:  .  Cholecalciferol (VITAMIN D3) 10 MCG/ML LIQD, Take by mouth daily., Disp: , Rfl:  .  Cranberry 1000 MG CAPS, Take by mouth daily., Disp: , Rfl:  .  EPINEPHrine (EPIPEN 2-PAK) 0.3 mg/0.3 mL IJ SOAJ injection, Inject 0.3 mg into the muscle once as needed (severe allergic reaction)., Disp: , Rfl:  .  fluticasone (FLONASE) 50 MCG/ACT nasal spray, Place 2 sprays into both nostrils daily., Disp: 16 g, Rfl: 6 .  Loratadine (CLARITIN PO), Take 5 mg by mouth daily., Disp: , Rfl:  .  metoprolol tartrate (LOPRESSOR) 25 MG tablet, TAKE 1/2 TABLET(12.5 MG) BY MOUTH TWICE DAILY, Disp: 45 tablet, Rfl: 1 .  Pediatric Multivit-Minerals-C (CHILDRENS VITAMINS PO), Take 1 tablet by mouth 2 (two) times a week., Disp: , Rfl:  .  Polyethyl Glycol-Propyl Glycol (SYSTANE OP), Place 1 drop into both eyes at bedtime., Disp: , Rfl:  .  Polyethylene Glycol 3350 (MIRALAX PO), Take 17 g by mouth daily as needed (for constipation). , Disp:  , Rfl:   EXAM:  VITALS per patient if applicable:denies fevers  GENERAL: alert, oriented, appears well and in no acute distress  HEENT: atraumatic, conjunttiva clear, no obvious abnormalities on inspection of external nose and ears, no rhinorrhea, no obvious LAD  NECK: normal movements of the head and neck  LUNGS: on inspection no signs of respiratory distress, breathing rate appears normal, no obvious gross SOB, gasping or wheezing  CV: no obvious cyanosis  MS: moves all visible extremities without noticeable abnormality  PSYCH/NEURO: pleasant and cooperative, no obvious depression or anxiety, speech and thought processing grossly intact, PER, EOMI  ASSESSMENT AND PLAN:  Discussed the following assessment and plan:  Allergic rhinitis, unspecified seasonality, unspecified trigger  Pressure sensation in both ears  Vertigo  -we discussed possible serious and likely  etiologies, workup and treatment, treatment risks and return precautions. Suspect rhinosinustis from allergies, ETD and possible developing sinusitis. Discussed other etiologies of vertigo as well. -after this discussion, Arnie opted for avoiding abx for now per her preference, nasal saline, 1/2 dose of zyrtec as she gets side effects with full dose, massage for ETD, flonase, home maneuvers for vertigo. She is to call back if any worsening or if not improving over the next few days.   I discussed the assessment and treatment plan with the patient. The patient was provided an opportunity to ask questions and all were answered. The patient agreed with the plan and demonstrated an understanding of the instructions.   The patient was advised to call back or seek an in-person evaluation if the symptoms worsen or if the condition fails to improve as anticipated.   Follow up instructions: Advised assistant Wendie Simmer to help patient arrange the following: -follow up in 1 week  Lucretia Kern, DO

## 2019-04-14 NOTE — Patient Instructions (Signed)
Nasal saline twice daily.  Zyrtec 5 mg daily.  Flonase 1 spray each nostril daily.  Try the video for vertigo. Please see the link below for more information and a treatment for vertigo.  [email or web address]  Please do not drive with vertigo.  I hope you are feeling better soon! Seek care promptly if your symptoms worsen, new concerns arise or you are not improving with treatment.

## 2019-05-02 ENCOUNTER — Other Ambulatory Visit: Payer: Self-pay | Admitting: Cardiology

## 2019-05-23 DIAGNOSIS — R102 Pelvic and perineal pain: Secondary | ICD-10-CM | POA: Diagnosis not present

## 2019-05-23 DIAGNOSIS — R8271 Bacteriuria: Secondary | ICD-10-CM | POA: Diagnosis not present

## 2019-05-23 DIAGNOSIS — N952 Postmenopausal atrophic vaginitis: Secondary | ICD-10-CM | POA: Diagnosis not present

## 2019-05-23 DIAGNOSIS — N3021 Other chronic cystitis with hematuria: Secondary | ICD-10-CM | POA: Diagnosis not present

## 2019-07-01 ENCOUNTER — Other Ambulatory Visit: Payer: Self-pay | Admitting: Obstetrics and Gynecology

## 2019-07-01 DIAGNOSIS — Z1231 Encounter for screening mammogram for malignant neoplasm of breast: Secondary | ICD-10-CM

## 2019-07-11 ENCOUNTER — Telehealth: Payer: Self-pay | Admitting: Gastroenterology

## 2019-07-11 DIAGNOSIS — G8929 Other chronic pain: Secondary | ICD-10-CM

## 2019-07-11 NOTE — Telephone Encounter (Signed)
C/O 1 week history of increasing RUQ/RMQ abdominal pain with chills, Low gd fever 99.6 Some nausea, feels full and bloated esp after eating. Also had constipation.  Has gall stones - concerned about gall bladder problems or colonic problems. No jaundice  Plan: I told her that we will try to work her in on 9/1 or 9/2 in APP clinic or Dr Celesta Aver clinic (if he has any cancellation). May need blood work or HIDA or CT (after contrast desensitization).    Sheri, Can you please see if we can work her in. See above. If there is absolutely no opening, just work her into my clinic (I don't mind)  Thanks   RG

## 2019-07-12 ENCOUNTER — Encounter: Payer: Self-pay | Admitting: Internal Medicine

## 2019-07-12 ENCOUNTER — Ambulatory Visit (INDEPENDENT_AMBULATORY_CARE_PROVIDER_SITE_OTHER): Payer: BC Managed Care – PPO | Admitting: Internal Medicine

## 2019-07-12 ENCOUNTER — Other Ambulatory Visit (INDEPENDENT_AMBULATORY_CARE_PROVIDER_SITE_OTHER): Payer: BC Managed Care – PPO

## 2019-07-12 VITALS — BP 110/70 | HR 80

## 2019-07-12 DIAGNOSIS — K581 Irritable bowel syndrome with constipation: Secondary | ICD-10-CM

## 2019-07-12 DIAGNOSIS — R1011 Right upper quadrant pain: Secondary | ICD-10-CM

## 2019-07-12 DIAGNOSIS — K8020 Calculus of gallbladder without cholecystitis without obstruction: Secondary | ICD-10-CM

## 2019-07-12 DIAGNOSIS — E559 Vitamin D deficiency, unspecified: Secondary | ICD-10-CM

## 2019-07-12 DIAGNOSIS — R10811 Right upper quadrant abdominal tenderness: Secondary | ICD-10-CM | POA: Diagnosis not present

## 2019-07-12 DIAGNOSIS — G8929 Other chronic pain: Secondary | ICD-10-CM | POA: Diagnosis not present

## 2019-07-12 LAB — COMPREHENSIVE METABOLIC PANEL
ALT: 11 U/L (ref 0–35)
AST: 11 U/L (ref 0–37)
Albumin: 4.2 g/dL (ref 3.5–5.2)
Alkaline Phosphatase: 56 U/L (ref 39–117)
BUN: 13 mg/dL (ref 6–23)
CO2: 25 mEq/L (ref 19–32)
Calcium: 9.1 mg/dL (ref 8.4–10.5)
Chloride: 105 mEq/L (ref 96–112)
Creatinine, Ser: 0.68 mg/dL (ref 0.40–1.20)
GFR: 92.29 mL/min (ref >60.00)
Glucose, Bld: 127 mg/dL — ABNORMAL HIGH (ref 70–99)
Potassium: 4.1 mEq/L (ref 3.5–5.1)
Sodium: 138 mEq/L (ref 135–145)
Total Bilirubin: 0.6 mg/dL (ref 0.2–1.2)
Total Protein: 7.1 g/dL (ref 6.0–8.3)

## 2019-07-12 LAB — CBC
HCT: 40.4 % (ref 36.0–46.0)
Hemoglobin: 13.8 g/dL (ref 12.0–15.0)
MCHC: 34.1 g/dL (ref 30.0–36.0)
MCV: 89.8 fl (ref 78.0–100.0)
Platelets: 231 10*3/uL (ref 150.0–400.0)
RBC: 4.5 Mil/uL (ref 3.87–5.11)
RDW: 12.5 % (ref 11.5–15.5)
WBC: 7.5 10*3/uL (ref 4.0–10.5)

## 2019-07-12 LAB — LIPASE: Lipase: 12 U/L (ref 11.0–59.0)

## 2019-07-12 NOTE — Telephone Encounter (Signed)
Left message for patient to call back  
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
## 2019-07-12 NOTE — Telephone Encounter (Signed)
This encounter was created in error - please disregard.

## 2019-07-12 NOTE — Telephone Encounter (Signed)
Have her come do CBC, CMET, lipase (I ordered them) and also set up a visit for today - if I can just have one room I can see her at 330 and will do on my own if she is willing or could do telehealth

## 2019-07-12 NOTE — Patient Instructions (Addendum)
We will set up a RUQ ultrasound to see how the gallstones and gallbladder are.  You have been scheduled for an abdominal ultrasound at [facility] (1st floor of hospital) on 07/13/19 at 11 am. Please arrive 15 minutes prior to your appointment for registration. Make certain not to have anything to eat or drink 6 hours prior to your appointment. Should you need to reschedule your appointment, please contact radiology at [phone]. This test typically takes about 30 minutes to perform.  We will call you with results.  Thank you for choosing me and [facility].   Silvano Rusk, MD

## 2019-07-12 NOTE — Telephone Encounter (Signed)
Patient aware she will come for labs this am and OV today at 3:30

## 2019-07-12 NOTE — Addendum Note (Signed)
Addended by: Marlon Pel on: 07/12/2019 10:25 AM   Modules accepted: Orders

## 2019-07-13 ENCOUNTER — Encounter: Payer: Self-pay | Admitting: Internal Medicine

## 2019-07-13 ENCOUNTER — Ambulatory Visit (HOSPITAL_COMMUNITY)
Admission: RE | Admit: 2019-07-13 | Discharge: 2019-07-13 | Disposition: A | Discharge status: Home / Self Care | Payer: BC Managed Care – PPO | Source: Ambulatory Visit | Attending: Internal Medicine | Admitting: Internal Medicine

## 2019-07-13 ENCOUNTER — Other Ambulatory Visit: Payer: Self-pay

## 2019-07-13 DIAGNOSIS — R1011 Right upper quadrant pain: Secondary | ICD-10-CM | POA: Diagnosis not present

## 2019-07-13 DIAGNOSIS — K8020 Calculus of gallbladder without cholecystitis without obstruction: Secondary | ICD-10-CM | POA: Diagnosis not present

## 2019-07-13 DIAGNOSIS — K7689 Other specified diseases of liver: Secondary | ICD-10-CM | POA: Diagnosis not present

## 2019-07-13 NOTE — Progress Notes (Signed)
Results provided  1) Refer to Dr. Alphonsa Overall re: gallstones, RUQ pain 2) Have her do lactulose H2 breath test to look for SIBO please dx bloating

## 2019-07-22 ENCOUNTER — Telehealth: Payer: Self-pay | Admitting: Cardiology

## 2019-07-22 NOTE — Telephone Encounter (Signed)
Pt c/o BP issue: STAT if pt c/o blurred vision, one-sided weakness or slurred speech  1. What are your last 5 BP readings?   9/10  92/60 77 hr  Late around 11pm  9/11 112/65 77hr   Early this morning   93/64 68 hr  mid morning   105/63 with caffeine   97/60   2. Are you having any other symptoms (ex. Dizziness, headache, blurred vision, passed out)? Feeling lethargic , dizziness  3. What is your BP issue? [place] BP

## 2019-07-22 NOTE — Telephone Encounter (Signed)
Spoke with patient and she is concerned about her blood pressures being low. She is feeling lethargic and dizzy, stated she does not even want to get off the couch. She does not feel like she is going to pass out. Last night she did drink some powder Gatorade. She is staying hydrated   9/10 BP 92/60 HR 77 11 pm SHE DID NOT TAKE METOPROLOL 9/11 BP 112/65 HR 77 early am prior to meds          BP 93/64 HR 68 mid morning         BP 105/63 AND 97/60 later today and had drank some caffeine   Some of these readings are even after she has been up moving around. Currently taking Metoprolol 25 mg 1/2 table twice a day. She knows that she needs the Metoprolol and is concerned about not taking. However she is feeling poorly and concerned about her blood pressure being low. Patient also wants to know at what number should she NOT take her Metoprolol.   Will forward to Dr Stanford Breed for review

## 2019-07-22 NOTE — Telephone Encounter (Signed)
Advised patient, verbalized understanding  

## 2019-07-22 NOTE — Telephone Encounter (Signed)
Hold metoprolol for SBP < 95. Kirk Ruths

## 2019-08-25 DIAGNOSIS — E559 Vitamin D deficiency, unspecified: Secondary | ICD-10-CM | POA: Diagnosis not present

## 2019-08-25 DIAGNOSIS — Z01419 Encounter for gynecological examination (general) (routine) without abnormal findings: Secondary | ICD-10-CM | POA: Diagnosis not present

## 2019-08-25 DIAGNOSIS — Z6823 Body mass index (BMI) 23.0-23.9, adult: Secondary | ICD-10-CM | POA: Diagnosis not present

## 2019-08-25 DIAGNOSIS — N952 Postmenopausal atrophic vaginitis: Secondary | ICD-10-CM | POA: Diagnosis not present

## 2019-08-30 ENCOUNTER — Other Ambulatory Visit: Payer: Self-pay

## 2019-08-30 ENCOUNTER — Ambulatory Visit
Admission: RE | Admit: 2019-08-30 | Discharge: 2019-08-30 | Disposition: A | Discharge status: Home / Self Care | Payer: BC Managed Care – PPO | Source: Ambulatory Visit | Attending: Obstetrics and Gynecology | Admitting: Obstetrics and Gynecology

## 2019-08-30 DIAGNOSIS — Z1231 Encounter for screening mammogram for malignant neoplasm of breast: Secondary | ICD-10-CM | POA: Diagnosis not present

## 2019-08-30 DIAGNOSIS — Z9189 Other specified personal risk factors, not elsewhere classified: Secondary | ICD-10-CM | POA: Insufficient documentation

## 2019-09-08 DIAGNOSIS — K8020 Calculus of gallbladder without cholecystitis without obstruction: Secondary | ICD-10-CM | POA: Diagnosis not present

## 2019-09-09 ENCOUNTER — Telehealth (INDEPENDENT_AMBULATORY_CARE_PROVIDER_SITE_OTHER): Payer: BC Managed Care – PPO | Admitting: Cardiology

## 2019-09-09 ENCOUNTER — Encounter: Payer: Self-pay | Admitting: Cardiology

## 2019-09-09 VITALS — BP 95/68 | HR 69 | Ht 67.0 in | Wt 150.0 lb

## 2019-09-09 DIAGNOSIS — R002 Palpitations: Secondary | ICD-10-CM | POA: Diagnosis not present

## 2019-09-09 DIAGNOSIS — I5189 Other ill-defined heart diseases: Secondary | ICD-10-CM

## 2019-09-09 DIAGNOSIS — I4892 Unspecified atrial flutter: Secondary | ICD-10-CM

## 2019-09-09 NOTE — Progress Notes (Signed)
Virtual Visit via Video Note   This visit type was conducted due to national recommendations for restrictions regarding the COVID-19 Pandemic (e.g. social distancing) in an effort to limit this patient's exposure and mitigate transmission in our community.  Due to her co-morbid illnesses, this patient is at least at moderate risk for complications without adequate follow up.  This format is felt to be most appropriate for this patient at this time.  All issues noted in this document were discussed and addressed.  A limited physical exam was performed with this format.  Please refer to the patient's chart for her consent to telehealth for [facility].   Date:  09/09/2019   ID:  MURTIS TEN, DOB 26-Jun-1971, MRN [id number]  Patient Location:Home Provider Location: Home  PCP:  Lucretia Kern, DO  Cardiologist:  Dr Stanford Breed  Evaluation Performed:  Follow-Up Visit  Chief Complaint:  FU palpitations and atrial lipoma  History of Present Illness:    FUatrial lipoma. Patient was seenpreviouslyfor dyspnea and echocardiogram showed right atrial mass. Coronary CTA August 2017 showed a calcium score of 0 and no coronary artery disease. There was a large right atrial mass. Patient subsequently had resection of right atriallipomaand repair of patent foramen ovale. Patient was admitted in December 2017 with new onset atrial flutter. She converted to sinus spontaneously. TSH was normal. She was not anticoagulated as heronly embolic risk factor was female sex (Duck 1).Monitor November 2018 showed sinus to sinus tachycardia with occasional PACs, PVCsand brief PAT. Last echocardiogram November 2018 showed normal LV function and mild tricuspid regurgitation.Since last seen,she has occasional dyspnea, occasional brief 1 to 2-second episode of chest pain and minor palpitations.  No syncope.  The patient does not have symptoms concerning for COVID-19 infection (fever, chills, cough, or new  shortness of breath).    Past Medical History:  Diagnosis Date  . Adenomyosis    not papanicolaou smear of cervix and cervical HPV  . Anxiety 06/08/2016   -about her health and about taking medications  . Arthritis    ?  Marland Kitchen Asthma   . 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 Chronic fatigue 06/08/2016   -eval with rheum x2, neurology, gastroenterology  . Chronic pain 06/08/2016   -all over her whole life; joints, muscles, head -numerous evaluations, Dr. Trudie Reed in 2017, [place] rheum -seeing Dr. Jaynee Eagles, Neurologist for back pain with radicular symptoms  . Complication of anesthesia    "with epidural bp bottoms out"  . Dysrhythmia    fast hr  . Eosinophilic esophagitis    Sees Dr. Carlean Purl  . Fibromyalgia   . GERD (gastroesophageal reflux disease)   . Heart murmur   . History of pneumonia    when pt was pregnant  . Iron deficiency anemia due to chronic blood loss    Menorrhagia, s/p complete hysterectomy, ovaries remain hx  . Irritable bowel syndrome 01/27/2013   Dr Carlean Purl 02/2016   . Microhematuria   . Mouth problem    failed gum graft 1/16  . Nasal airway abnormality   . Nasal obstruction 06/26/2015   Seen by ENT 06-26-15.  May need sleep study to see if having apnea    . Palpitations   . Pelvic floor dysfunction in female   . 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 Shortness of breath dyspnea   .  SUI (stress urinary incontinence, female)   . Syncope    with last child with epideral  . UTI (urinary tract infection) 07/14/2016   >=100,000 COLONIES/mL KLEBSIELLA PNEUMONIA   Past Surgical History:  Procedure Laterality Date  . APPENDECTOMY    . BALLOON DILATION N/A 02/09/2013   Procedure: BALLOON DILATION;  Surgeon: Arta Silence, MD;  [facility];  Service: Endoscopy;  Laterality: N/A;  . BILATERAL SALPINGECTOMY N/A 06/14/2014   Procedure: BILATERAL SALPINGECTOMY;   Surgeon: Allena Katz, MD;  Location: [place] [facility];  Service: Gynecology;  Laterality: N/A;  . BREAST BIOPSY    . COLONOSCOPY WITH PROPOFOL N/A 02/09/2013   Procedure: COLONOSCOPY WITH PROPOFOL;  Surgeon: Arta Silence, MD;  [facility];  Service: Endoscopy;  Laterality: N/A;  need ultra thin colon scope  . ESOPHAGOGASTRODUODENOSCOPY (EGD) WITH PROPOFOL N/A 02/09/2013   Procedure: ESOPHAGOGASTRODUODENOSCOPY (EGD) WITH PROPOFOL;  Surgeon: Arta Silence, MD;  [facility];  Service: Endoscopy;  Laterality: N/A;  . LAPAROSCOPIC ASSISTED VAGINAL HYSTERECTOMY Right 06/14/2014   Procedure: OPEN LAPAROSCOPIC ASSISTED VAGINAL HYSTERECTOMY;  Surgeon: Allena Katz, MD;  Location: [place] [facility];  Service: Gynecology;  Laterality: Right;  . LYSIS OF ADHESION N/A 06/14/2014   Procedure: LYSIS OF ADHESION;  Surgeon: Allena Katz, MD;  Location: [place] [facility];  Service: Gynecology;  Laterality: N/A;  . MINIMALLY INVASIVE EXCISION OF ATRIAL MYXOMA Right 07/11/2016   Procedure: MINIMALLY INVASIVE RESECTION OF RIGHT ATRIAL LIPOMA WITH CLOSURE OF PATENT FORAMEN OVALE;  Surgeon: Rexene Alberts, MD;  Location: [place];  Service: Open Heart Surgery;  Laterality: Right;  . OVARIAN CYST REMOVAL Right 1990  . TEE WITHOUT CARDIOVERSION N/A 07/11/2016   Procedure: TRANSESOPHAGEAL ECHOCARDIOGRAM (TEE);  Surgeon: Rexene Alberts, MD;  Location: [place];  Service: Open Heart Surgery;  Laterality: N/A;     Current Meds  Medication Sig  . albuterol (PROVENTIL HFA;VENTOLIN HFA) 108 (90 Base) MCG/ACT inhaler Inhale 2 puffs into the lungs every 6 (six) hours as needed (asthmatic bronchitis).  . Cholecalciferol (VITAMIN D3) 10 MCG/ML LIQD Take by mouth daily.  . Cranberry 1000 MG CAPS Take by mouth daily.  Marland Kitchen EPINEPHrine (EPIPEN 2-PAK) 0.3 mg/0.3 mL IJ SOAJ injection Inject 0.3 mg into the muscle once as needed (severe allergic reaction).  . fluticasone (FLONASE) 50 MCG/ACT nasal spray Place 2 sprays into both nostrils  daily.  . Loratadine (CLARITIN PO) Take 5 mg by mouth daily.  . metoprolol tartrate (LOPRESSOR) 25 MG tablet TAKE 1/2 TABLET(12.5 MG) BY MOUTH TWICE DAILY  . Pediatric Multivit-Minerals-C (CHILDRENS VITAMINS PO) Take 1 tablet by mouth 2 (two) times a week.  Vladimir Faster Glycol-Propyl Glycol (SYSTANE OP) Place 1 drop into both eyes at bedtime.  . Polyethylene Glycol 3350 (MIRALAX PO) Take 17 g by mouth daily as needed (for constipation).      Allergies:   Avocado, Macadamia nut oil, Mango flavor, Other, Peanuts [peanut oil], Ciprofloxacin, Demerol [meperidine], Iodinated diagnostic agents, Macrobid [nitrofurantoin], Latex, Dulcolax [bisacodyl], Sulfamethoxazole, and Tape   Social History   Tobacco Use  . Smoking status: Never Smoker  . Smokeless tobacco: Never Used  Substance Use Topics  . Alcohol use: Yes    Alcohol/week: 0.0 standard drinks    Comment: 3 glasses wine per week non recently  . Drug use: No     Family Hx: The patient's family history includes Breast cancer (age of onset: 58) in her mother; CAD in her father; Cancer in her father, maternal grandfather, and  mother; Esophageal cancer in her paternal uncle; Kidney disease in her paternal grandfather; Stroke in her father and paternal grandfather.  ROS:   Please see the history of present illness.    No Fever, chills  or productive cough; occasional abdominal pain All other systems reviewed and are negative.   Recent Labs: 07/12/2019: ALT 11; BUN 13; Creatinine, Ser 0.68; Hemoglobin 13.8; Platelets 231.0; Potassium 4.1; Sodium 138    Wt Readings from Last 3 Encounters:  09/09/19 150 lb (68 kg)  12/28/18 150 lb (68 kg)  08/13/18 153 lb (69.4 kg)     Objective:    Vital Signs:  BP 95/68   Pulse 69   Ht 5\' 7"  (1.702 m)   Wt 150 lb (68 kg)   LMP 05/20/2014 (Approximate)   BMI 23.49 kg/m    VITAL SIGNS:  reviewed NAD Answers questions appropriately Normal affect Remainder of physical examination not performed  (telehealth visit; coronavirus pandemic)  ASSESSMENT & PLAN:    1. History of right atrial lipoma-patient has had prior resection.  No recurrence on most recent echocardiogram. 2. Palpitations-symptoms are reasonably well controlled.  Continue low-dose Toprol. 3. Atrial flutter-patient remains in sinus rhythm.  Continue low-dose metoprolol. CHADSvasc 1 for female sex.  We have therefore not anticoagulated at this point.  COVID-19 Education: The importance of social distancing was discussed today.  Time:   Today, I have spent 17 minutes with the patient with telehealth technology discussing the above problems.     Medication Adjustments/Labs and Tests Ordered: Current medicines are reviewed at length with the patient today.  Concerns regarding medicines are outlined above.   Tests Ordered: No orders of the defined types were placed in this encounter.   Medication Changes: No orders of the defined types were placed in this encounter.   Follow Up:  Either In Person or Virtual in 1 year(s)  Signed, Kirk Ruths, MD  09/09/2019 8:12 AM    [place]

## 2019-09-09 NOTE — Patient Instructions (Signed)
Medication Instructions:  NO CHANGE *If you need a refill on your cardiac medications before your next appointment, please call your pharmacy*  Lab Work: If you have labs (blood work) drawn today and your tests are completely normal, you will receive your results only by: . MyChart Message (if you have MyChart) OR . A paper copy in the mail If you have any lab test that is abnormal or we need to change your treatment, we will call you to review the results.  Follow-Up: At [facility], you and your health needs are our priority.  As part of our continuing mission to provide you with exceptional heart care, we have created designated Provider Care Teams.  These Care Teams include your primary Cardiologist (physician) and Advanced Practice Providers (APPs -  Physician Assistants and Nurse Practitioners) who all work together to provide you with the care you need, when you need it.  Your next appointment:   12 month(s)  The format for your next appointment:   Either In Person or Virtual  Provider:   Brian Crenshaw, MD   

## 2019-09-13 ENCOUNTER — Other Ambulatory Visit: Payer: Self-pay | Admitting: Cardiology

## 2019-09-13 MED ORDER — METOPROLOL TARTRATE 25 MG PO TABS: ORAL_TABLET | ORAL | 3 refills | Status: DC

## 2019-09-13 NOTE — Telephone Encounter (Signed)
°*  STAT* If patient is at the pharmacy, call can be transferred to refill team.   1. Which medications need to be refilled? (please list name of each medication and dose if known) metoprolol tartrate (LOPRESSOR) 25 MG tablet  2. Which pharmacy/location (including street and city if local pharmacy) is medication to be sent to? [place], [place] - [address] AT [place]  3. Do they need a 30 day or 90 day supply? 90 day  Patient only has 3 days left

## 2019-09-14 ENCOUNTER — Other Ambulatory Visit: Payer: Self-pay

## 2019-09-14 MED ORDER — METOPROLOL TARTRATE 25 MG PO TABS: ORAL_TABLET | ORAL | 3 refills | Status: DC

## 2019-10-13 ENCOUNTER — Telehealth: Payer: Self-pay | Admitting: Cardiology

## 2019-10-13 NOTE — Telephone Encounter (Signed)
Will forward to pharmacy to review ./cy

## 2019-10-13 NOTE — Telephone Encounter (Signed)
Pt aware of of [facility] recommendations and agrees ./cy

## 2019-10-13 NOTE — Telephone Encounter (Signed)
New Message    Pt is calling and says her dentist gave her a medication named Medrol for her tooth, She said she has had some aggravation with the tooth that gotten a crown   She I wanting to know if the medication is safe and the recommended dosage    Please call back

## 2019-10-13 NOTE — Telephone Encounter (Signed)
No interactions are expected with her current medication and no contraindications noted at this time.  Dose may change from 4mg  to 64mg  per day based on the indication.   Please take it as prescribed by your dentist. Take medication with with meal or immediately after.

## 2019-11-15 ENCOUNTER — Encounter (HOSPITAL_COMMUNITY): Payer: Self-pay

## 2019-11-15 ENCOUNTER — Other Ambulatory Visit: Payer: Self-pay | Admitting: Surgery

## 2019-11-15 NOTE — Progress Notes (Signed)
Spoke with Sunday Spillers at Dr. Pollie Friar office to request pre op orders placed in epic.

## 2019-11-15 NOTE — Patient Instructions (Signed)
DUE TO COVID-19 ONLY ONE VISITOR IS ALLOWED TO COME WITH YOU AND STAY IN THE WAITING ROOM ONLY DURING PRE OP AND PROCEDURE. THE ONE VISITOR MAY VISIT WITH YOU IN YOUR PRIVATE ROOM DURING VISITING HOURS ONLY!!   COVID SWAB TESTING MUST BE COMPLETED ON: Thursday, Jan. 7, 2021 at 8:00 AM      [address] [place] -[facility] enter pre surgical testing line (Must self quarantine after testing. Follow instructions on handout.)             Your procedure is scheduled on: Monday, Jan. 11, 2021   Report to [facility] Main  Entrance    Report to admitting at 9:00 AM   Call this number if you have problems the morning of surgery [phone]   Do not eat food or drink liquids :After Midnight.   Brush your teeth the morning of surgery.   Do NOT smoke after Midnight   Take these medicines the morning of surgery with A SIP OF WATER: Metoprolol   May use Flonase day of surgery   Bring Rescue Inhaler day of surgery                               You may not have any metal on your body including hair pins, jewelry, and body piercings             Do not wear make-up, lotions, powders, perfumes/cologne, or deodorant             Do not wear nail polish.  Do not shave  48 hours prior to surgery.               Do not bring valuables to the hospital. [place].   Contacts, dentures or bridgework may not be worn into surgery.    Patients discharged the day of surgery will not be allowed to drive home.   Special Instructions: Bring a copy of your healthcare power of attorney and living will documents         the day of surgery if you haven't scanned them in before.              Please read over the following fact sheets you were given:  [facility] - Preparing for Surgery Before surgery, you can play an important role.  Because skin is not sterile, your skin needs to be as free of germs as possible.  You can  reduce the number of germs on your skin by washing with CHG (chlorahexidine gluconate) soap before surgery.  CHG is an antiseptic cleaner which kills germs and bonds with the skin to continue killing germs even after washing. Please DO NOT use if you have an allergy to CHG or antibacterial soaps.  If your skin becomes reddened/irritated stop using the CHG and inform your nurse when you arrive at Short Stay. Do not shave (including legs and underarms) for at least 48 hours prior to the first CHG shower.  You may shave your face/neck.  Please follow these instructions carefully:  1.  Shower with CHG Soap the night before surgery and the  morning of surgery.  2.  If you choose to wash your hair, wash your hair first as usual with your normal  shampoo.  3.  After you shampoo,  rinse your hair and body thoroughly to remove the shampoo.                             4.  Use CHG as you would any other liquid soap.  You can apply chg directly to the skin and wash.  Gently with a scrungie or clean washcloth.  5.  Apply the CHG Soap to your body ONLY FROM THE NECK DOWN.   Do   not use on face/ open                           Wound or open sores. Avoid contact with eyes, ears mouth and   genitals (private parts).                       Wash face,  Genitals (private parts) with your normal soap.             6.  Wash thoroughly, paying special attention to the area where your    surgery  will be performed.  7.  Thoroughly rinse your body with warm water from the neck down.  8.  DO NOT shower/wash with your normal soap after using and rinsing off the CHG Soap.                9.  Pat yourself dry with a clean towel.            10.  Wear clean pajamas.            11.  Place clean sheets on your bed the night of your first shower and do not  sleep with pets. Day of Surgery : Do not apply any lotions/deodorants the morning of surgery.  Please wear clean clothes to the hospital/surgery center.  FAILURE TO FOLLOW THESE  INSTRUCTIONS MAY RESULT IN THE CANCELLATION OF YOUR SURGERY  PATIENT SIGNATURE_________________________________  NURSE SIGNATURE__________________________________  ________________________________________________________________________

## 2019-11-16 ENCOUNTER — Encounter (HOSPITAL_COMMUNITY): Payer: Self-pay

## 2019-11-16 ENCOUNTER — Other Ambulatory Visit: Payer: Self-pay

## 2019-11-16 ENCOUNTER — Encounter (HOSPITAL_COMMUNITY)
Admission: RE | Admit: 2019-11-16 | Discharge: 2019-11-16 | Disposition: A | Discharge status: Home / Self Care | Payer: BC Managed Care – PPO | Source: Ambulatory Visit | Attending: Surgery | Admitting: Surgery

## 2019-11-16 DIAGNOSIS — Z01812 Encounter for preprocedural laboratory examination: Secondary | ICD-10-CM | POA: Insufficient documentation

## 2019-11-16 HISTORY — DX: Personal history of other diseases of the circulatory system: Z86.79

## 2019-11-16 HISTORY — DX: Personal history of other diseases of the digestive system: Z87.19

## 2019-11-16 HISTORY — DX: Vitamin D deficiency, unspecified: E55.9

## 2019-11-16 LAB — CBC WITH DIFFERENTIAL/PLATELET
Abs Immature Granulocytes: 0.04 10*3/uL (ref 0.00–0.07)
Basophils Absolute: 0.1 10*3/uL (ref 0.0–0.1)
Basophils Relative: 1 %
Eosinophils Absolute: 0.1 10*3/uL (ref 0.0–0.5)
Eosinophils Relative: 1 %
HCT: 40 % (ref 36.0–46.0)
Hemoglobin: 13.4 g/dL (ref 12.0–15.0)
Immature Granulocytes: 1 %
Lymphocytes Relative: 18 %
Lymphs Abs: 1.5 10*3/uL (ref 0.7–4.0)
MCH: 30.8 pg (ref 26.0–34.0)
MCHC: 33.5 g/dL (ref 30.0–36.0)
MCV: 92 fL (ref 80.0–100.0)
Monocytes Absolute: 0.5 10*3/uL (ref 0.1–1.0)
Monocytes Relative: 6 %
Neutro Abs: 6.3 10*3/uL (ref 1.7–7.7)
Neutrophils Relative %: 73 %
Platelets: 252 10*3/uL (ref 150–400)
RBC: 4.35 MIL/uL (ref 3.87–5.11)
RDW: 12.1 % (ref 11.5–15.5)
WBC: 8.5 10*3/uL (ref 4.0–10.5)
nRBC: 0 % (ref 0.0–0.2)

## 2019-11-16 LAB — COMPREHENSIVE METABOLIC PANEL
ALT: 17 U/L (ref 0–44)
AST: 16 U/L (ref 15–41)
Albumin: 4.2 g/dL (ref 3.5–5.0)
Alkaline Phosphatase: 60 U/L (ref 38–126)
Anion gap: 8 (ref 5–15)
BUN: 15 mg/dL (ref 6–20)
CO2: 28 mmol/L (ref 22–32)
Calcium: 9 mg/dL (ref 8.9–10.3)
Chloride: 103 mmol/L (ref 98–111)
Creatinine, Ser: 0.72 mg/dL (ref 0.44–1.00)
GFR calc Af Amer: >60 mL/min (ref >60)
GFR calc non Af Amer: >60 mL/min (ref >60)
Glucose, Bld: 96 mg/dL (ref 70–99)
Potassium: 4.1 mmol/L (ref 3.5–5.1)
Sodium: 139 mmol/L (ref 135–145)
Total Bilirubin: 0.5 mg/dL (ref 0.3–1.2)
Total Protein: 7.3 g/dL (ref 6.5–8.1)

## 2019-11-16 MED ORDER — CHLORHEXIDINE GLUCONATE CLOTH 2 % EX PADS
6.0000 | MEDICATED_PAD | Freq: Once | CUTANEOUS | Status: DC
  Filled 2019-11-16: qty 6

## 2019-11-16 NOTE — Progress Notes (Signed)
PCP - Dr. Blima Singer Cardiologist - Kirk Ruths, MD LOV 09/09/19  Chest x-ray -  EKG - 11/16/2019 Stress Test -  ECHO - 09/22/17 Cardiac Cath -   Sleep Study - n/a CPAP -   Fasting Blood Sugar - n/a  Checks Blood Sugar _____ times a day  Blood Thinner Instructions: No blood thinners Aspirin Instructions: Last Dose:  Anesthesia review: Hx of atrial lipoma removed, and A. Flutter. Chart given to Konrad Felix, PA for review.  Patient denies shortness of breath, fever, cough and chest pain at PAT appointment   Patient verbalized understanding of instructions that were given to them at the PAT appointment. Patient was also instructed that they will need to review over the PAT instructions again at home before surgery.

## 2019-11-17 ENCOUNTER — Other Ambulatory Visit (HOSPITAL_COMMUNITY)
Admission: RE | Admit: 2019-11-17 | Discharge: 2019-11-17 | Disposition: A | Discharge status: Home / Self Care | Payer: BC Managed Care – PPO | Source: Ambulatory Visit | Attending: Surgery | Admitting: Surgery

## 2019-11-17 DIAGNOSIS — Z20822 Contact with and (suspected) exposure to covid-19: Secondary | ICD-10-CM | POA: Diagnosis not present

## 2019-11-17 DIAGNOSIS — Z01812 Encounter for preprocedural laboratory examination: Secondary | ICD-10-CM | POA: Diagnosis not present

## 2019-11-17 NOTE — Progress Notes (Signed)
Anesthesia Chart Review   Case: [id number] Date/Time: 11/21/19 1045   Procedure: LAPAROSCOPIC CHOLECYSTECTOMY WITH INTRAOPERATIVE CHOLANGIOGRAM (N/A )   Anesthesia type: General   Pre-op diagnosis: SYMPTOMATIC CHOLELITHIASIS   [facility]: [facility]   Surgeons: Alphonsa Overall, MD      DISCUSSION:49 y.o. never smoker with h/o GERD, fibromyalgia, asthma, s/p minimally invasive resection of right atrial lipoma resection and repair of patent foramen ovale 2017, chronic pain, symptomatic cholelithiasis scheduled for above procedure 11/21/2019 with Dr. Alphonsa Overall.   H/o Atrial flutter, converted to sinus spontaneously.  CHADSvasc 1, no anticoagulatants recommended.  Last visit with cardiologist, Dr. Kirk Ruths, 09/09/2019.  Per OV note, "1. History of right atrial lipoma-patient has had prior resection.  No recurrence on most recent echocardiogram.  2. Palpitations-symptoms are reasonably well controlled.  Continue low-dose Toprol. 3. Atrial flutter-patient remains in sinus rhythm.  Continue low-dose metoprolol. CHADSvasc 1 for female sex.  We have therefore not anticoagulated at this point." 1 year follow up recommended.    Anticipate pt can proceed with planned procedure barring acute status change.   VS: BP 108/65   Pulse 71   Temp 36.9 C (Oral)   Resp 16   Ht 5\' 7"  (1.702 m)   Wt 68 kg   LMP 05/20/2014 (Approximate)   SpO2 100%   BMI 23.49 kg/m   PROVIDERS: Lucretia Kern, DO is PCP   Kirk Ruths, MD is Cardiologist   LABS: Labs reviewed: Acceptable for surgery. (all labs ordered are listed, but only abnormal results are displayed)  Labs Reviewed  CBC WITH DIFFERENTIAL/PLATELET  COMPREHENSIVE METABOLIC PANEL     IMAGES:   EKG:    CV: Echo 09/22/2017 Study Conclusions  - Left ventricle: The cavity size was normal. Wall thickness was   normal. Systolic function was normal. The estimated ejection   fraction was in the range of 55% to 60%. Wall motion  was normal;   there were no regional wall motion abnormalities. Left   ventricular diastolic function parameters were normal.  Impressions:  - Normal LV systolic and diastolic function; mild TR. Past Medical History:  Diagnosis Date  . Adenomyosis    not papanicolaou smear of cervix and cervical HPV  . Anxiety 06/08/2016   -about her health and about taking medications  . Arthritis    ?  Marland Kitchen Asthma   . 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 Chronic fatigue 06/08/2016   -eval with rheum x2, neurology, gastroenterology  . Chronic pain 06/08/2016   -all over her whole life; joints, muscles, head -numerous evaluations, Dr. Trudie Reed in 2017, [place] rheum -seeing Dr. Jaynee Eagles, Neurologist for back pain with radicular symptoms  . Complication of anesthesia    "with epidural bp bottoms out"  . Dysrhythmia    fast hr  . Eosinophilic esophagitis    Sees Dr. Carlean Purl  . Fibromyalgia   . GERD (gastroesophageal reflux disease)   . Heart murmur   . History of atrial flutter 10/2016   spontaneous conversion to sinus  . History of gallstones   . History of pneumonia    when pt was pregnant  . History of pneumothorax 07/2016   Right  . History of sinus tachycardia   . Iron deficiency anemia due to chronic blood loss    Menorrhagia, s/p complete hysterectomy, ovaries remain hx  . Irritable bowel syndrome 01/27/2013   Dr Carlean Purl 02/2016   . Microhematuria   .  Mouth problem    failed gum graft 1/16  . Nasal airway abnormality   . Nasal obstruction 06/26/2015   Seen by ENT 06-26-15.  May need sleep study to see if having apnea    . Palpitations   . Pelvic floor dysfunction in female   . 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 Shortness of breath dyspnea   . SUI (stress urinary incontinence, female)   . Syncope    with last child with epideral  . UTI (urinary tract infection) 07/14/2016    >=100,000 COLONIES/mL KLEBSIELLA PNEUMONIA  . Vitamin D deficiency     Past Surgical History:  Procedure Laterality Date  . APPENDECTOMY    . BALLOON DILATION N/A 02/09/2013   Procedure: BALLOON DILATION;  Surgeon: Arta Silence, MD;  [facility];  Service: Endoscopy;  Laterality: N/A;  . BILATERAL SALPINGECTOMY N/A 06/14/2014   Procedure: BILATERAL SALPINGECTOMY;  Surgeon: Allena Katz, MD;  Location: [place] [facility];  Service: Gynecology;  Laterality: N/A;  . BREAST BIOPSY    . COLONOSCOPY WITH PROPOFOL N/A 02/09/2013   Procedure: COLONOSCOPY WITH PROPOFOL;  Surgeon: Arta Silence, MD;  [facility];  Service: Endoscopy;  Laterality: N/A;  need ultra thin colon scope  . ESOPHAGOGASTRODUODENOSCOPY (EGD) WITH PROPOFOL N/A 02/09/2013   Procedure: ESOPHAGOGASTRODUODENOSCOPY (EGD) WITH PROPOFOL;  Surgeon: Arta Silence, MD;  [facility];  Service: Endoscopy;  Laterality: N/A;  . LAPAROSCOPIC ASSISTED VAGINAL HYSTERECTOMY Right 06/14/2014   Procedure: OPEN LAPAROSCOPIC ASSISTED VAGINAL HYSTERECTOMY;  Surgeon: Allena Katz, MD;  Location: [place] [facility];  Service: Gynecology;  Laterality: Right;  . LYSIS OF ADHESION N/A 06/14/2014   Procedure: LYSIS OF ADHESION;  Surgeon: Allena Katz, MD;  Location: [place] [facility];  Service: Gynecology;  Laterality: N/A;  . MINIMALLY INVASIVE EXCISION OF ATRIAL MYXOMA Right 07/11/2016   Procedure: MINIMALLY INVASIVE RESECTION OF RIGHT ATRIAL LIPOMA WITH CLOSURE OF PATENT FORAMEN OVALE;  Surgeon: Rexene Alberts, MD;  Location: [place];  Service: Open Heart Surgery;  Laterality: Right;  . OVARIAN CYST REMOVAL Right 1990  . TEE WITHOUT CARDIOVERSION N/A 07/11/2016   Procedure: TRANSESOPHAGEAL ECHOCARDIOGRAM (TEE);  Surgeon: Rexene Alberts, MD;  Location: [place];  Service: Open Heart Surgery;  Laterality: N/A;    MEDICATIONS: . albuterol (PROVENTIL HFA;VENTOLIN HFA) 108 (90 Base) MCG/ACT inhaler  . cholecalciferol (VITAMIN D) 25 MCG (1000 UT) tablet   . Cranberry-Vitamin C-Probiotic (AZO CRANBERRY) 250-30 MG TABS  . EPINEPHrine (EPIPEN 2-PAK) 0.3 mg/0.3 mL IJ SOAJ injection  . fluticasone (FLONASE) 50 MCG/ACT nasal spray  . metoprolol tartrate (LOPRESSOR) 25 MG tablet  . Pediatric Multivit-Minerals-C (CHILDRENS VITAMINS PO)  . Polyethyl Glycol-Propyl Glycol (SYSTANE OP)   No current facility-administered medications for this encounter.   . Chlorhexidine Gluconate Cloth 2 % PADS 6 each   And  . Chlorhexidine Gluconate Cloth 2 % PADS 6 each     [facility] Pre-Surgical Testing [phone] 11/17/19  12:13 PM

## 2019-11-19 LAB — NOVEL CORONAVIRUS, NAA (HOSP ORDER, SEND-OUT TO REF LAB; TAT 18-24 HRS): SARS-CoV-2, NAA: NOT DETECTED
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
## 2019-11-21 ENCOUNTER — Encounter (HOSPITAL_COMMUNITY)
Admission: RE | Disposition: A | Discharge status: Home / Self Care | Payer: Self-pay | Source: Ambulatory Visit | Attending: Surgery

## 2019-11-21 ENCOUNTER — Ambulatory Visit (HOSPITAL_COMMUNITY): Payer: BC Managed Care – PPO

## 2019-11-21 ENCOUNTER — Ambulatory Visit (HOSPITAL_COMMUNITY): Payer: BC Managed Care – PPO | Admitting: Physician Assistant

## 2019-11-21 ENCOUNTER — Encounter (HOSPITAL_COMMUNITY): Payer: Self-pay | Admitting: Surgery

## 2019-11-21 ENCOUNTER — Ambulatory Visit (HOSPITAL_COMMUNITY)
Admission: RE | Admit: 2019-11-21 | Discharge: 2019-11-21 | Disposition: A | Discharge status: Home / Self Care | Payer: BC Managed Care – PPO | Source: Ambulatory Visit | Attending: Surgery | Admitting: Surgery

## 2019-11-21 ENCOUNTER — Ambulatory Visit (HOSPITAL_COMMUNITY): Payer: BC Managed Care – PPO | Admitting: Anesthesiology

## 2019-11-21 DIAGNOSIS — Z419 Encounter for procedure for purposes other than remedying health state, unspecified: Secondary | ICD-10-CM

## 2019-11-21 DIAGNOSIS — I4892 Unspecified atrial flutter: Secondary | ICD-10-CM | POA: Diagnosis not present

## 2019-11-21 DIAGNOSIS — J45909 Unspecified asthma, uncomplicated: Secondary | ICD-10-CM | POA: Diagnosis not present

## 2019-11-21 DIAGNOSIS — F419 Anxiety disorder, unspecified: Secondary | ICD-10-CM | POA: Diagnosis not present

## 2019-11-21 DIAGNOSIS — K8010 Calculus of gallbladder with chronic cholecystitis without obstruction: Secondary | ICD-10-CM | POA: Diagnosis not present

## 2019-11-21 DIAGNOSIS — M797 Fibromyalgia: Secondary | ICD-10-CM | POA: Diagnosis not present

## 2019-11-21 DIAGNOSIS — K915 Postcholecystectomy syndrome: Secondary | ICD-10-CM | POA: Diagnosis not present

## 2019-11-21 DIAGNOSIS — K8020 Calculus of gallbladder without cholecystitis without obstruction: Secondary | ICD-10-CM | POA: Diagnosis not present

## 2019-11-21 DIAGNOSIS — M1990 Unspecified osteoarthritis, unspecified site: Secondary | ICD-10-CM | POA: Insufficient documentation

## 2019-11-21 DIAGNOSIS — Z79899 Other long term (current) drug therapy: Secondary | ICD-10-CM | POA: Insufficient documentation

## 2019-11-21 DIAGNOSIS — Z800 Family history of malignant neoplasm of digestive organs: Secondary | ICD-10-CM | POA: Insufficient documentation

## 2019-11-21 HISTORY — PX: CHOLECYSTECTOMY: SHX55

## 2019-11-21 SURGERY — LAPAROSCOPIC CHOLECYSTECTOMY WITH INTRAOPERATIVE CHOLANGIOGRAM
Anesthesia: General

## 2019-11-21 MED ORDER — GABAPENTIN 300 MG PO CAPS
300.0000 mg | ORAL_CAPSULE | ORAL | Status: DC
  Filled 2019-11-21: qty 1

## 2019-11-21 MED ORDER — FENTANYL CITRATE (PF) 100 MCG/2ML IJ SOLN
INTRAMUSCULAR | Status: DC | PRN
  Administered 2019-11-21: 100 ug via INTRAVENOUS
  Administered 2019-11-21 (×3): 50 ug via INTRAVENOUS

## 2019-11-21 MED ORDER — MIDAZOLAM HCL 2 MG/2ML IJ SOLN
INTRAMUSCULAR | Status: AC
  Filled 2019-11-21: qty 2

## 2019-11-21 MED ORDER — ACETAMINOPHEN 500 MG PO TABS
1000.0000 mg | ORAL_TABLET | ORAL | Status: AC
  Administered 2019-11-21: 1000 mg via ORAL
  Filled 2019-11-21: qty 2

## 2019-11-21 MED ORDER — 0.9 % SODIUM CHLORIDE (POUR BTL) OPTIME
TOPICAL | Status: DC | PRN
  Administered 2019-11-21: 1000 mL

## 2019-11-21 MED ORDER — ROCURONIUM BROMIDE 10 MG/ML (PF) SYRINGE
PREFILLED_SYRINGE | INTRAVENOUS | Status: DC | PRN
  Administered 2019-11-21: 50 mg via INTRAVENOUS
  Administered 2019-11-21: 10 mg via INTRAVENOUS

## 2019-11-21 MED ORDER — FENTANYL CITRATE (PF) 100 MCG/2ML IJ SOLN
INTRAMUSCULAR | Status: AC
  Administered 2019-11-21: 12:00:00 50 ug via INTRAVENOUS
  Filled 2019-11-21: qty 2

## 2019-11-21 MED ORDER — PROMETHAZINE HCL 25 MG/ML IJ SOLN: 6.2500 mg | INTRAMUSCULAR | Status: DC | PRN

## 2019-11-21 MED ORDER — ROCURONIUM BROMIDE 10 MG/ML (PF) SYRINGE
PREFILLED_SYRINGE | INTRAVENOUS | Status: AC
  Filled 2019-11-21: qty 10

## 2019-11-21 MED ORDER — FENTANYL CITRATE (PF) 250 MCG/5ML IJ SOLN
INTRAMUSCULAR | Status: AC
  Filled 2019-11-21: qty 5

## 2019-11-21 MED ORDER — LIDOCAINE 2% (20 MG/ML) 5 ML SYRINGE
INTRAMUSCULAR | Status: AC
  Filled 2019-11-21: qty 5

## 2019-11-21 MED ORDER — BUPIVACAINE HCL (PF) 0.25 % IJ SOLN
INTRAMUSCULAR | Status: AC
  Filled 2019-11-21: qty 30

## 2019-11-21 MED ORDER — ONDANSETRON HCL 4 MG/2ML IJ SOLN
INTRAMUSCULAR | Status: AC
  Filled 2019-11-21: qty 2

## 2019-11-21 MED ORDER — SUGAMMADEX SODIUM 200 MG/2ML IV SOLN
INTRAVENOUS | Status: DC | PRN
  Administered 2019-11-21: 140 mg via INTRAVENOUS

## 2019-11-21 MED ORDER — LACTATED RINGERS IV SOLN
INTRAVENOUS | Status: DC | PRN
  Administered 2019-11-21: 1000 mL

## 2019-11-21 MED ORDER — LIDOCAINE 2% (20 MG/ML) 5 ML SYRINGE
INTRAMUSCULAR | Status: DC | PRN
  Administered 2019-11-21: 50 mg via INTRAVENOUS

## 2019-11-21 MED ORDER — PHENYLEPHRINE 40 MCG/ML (10ML) SYRINGE FOR IV PUSH (FOR BLOOD PRESSURE SUPPORT)
PREFILLED_SYRINGE | INTRAVENOUS | Status: AC
  Filled 2019-11-21: qty 10

## 2019-11-21 MED ORDER — PROMETHAZINE HCL 25 MG/ML IJ SOLN
INTRAMUSCULAR | Status: AC
  Filled 2019-11-21: qty 1

## 2019-11-21 MED ORDER — IOHEXOL 300 MG/ML  SOLN
INTRAMUSCULAR | Status: DC | PRN
  Administered 2019-11-21: 3 mL

## 2019-11-21 MED ORDER — SCOPOLAMINE 1 MG/3DAYS TD PT72
1.0000 | MEDICATED_PATCH | TRANSDERMAL | Status: DC
  Administered 2019-11-21: 1.5 mg via TRANSDERMAL
  Filled 2019-11-21: qty 1

## 2019-11-21 MED ORDER — FENTANYL CITRATE (PF) 100 MCG/2ML IJ SOLN
25.0000 ug | INTRAMUSCULAR | Status: DC | PRN
  Administered 2019-11-21: 25 ug via INTRAVENOUS

## 2019-11-21 MED ORDER — DEXAMETHASONE SODIUM PHOSPHATE 10 MG/ML IJ SOLN
INTRAMUSCULAR | Status: AC
  Filled 2019-11-21: qty 1

## 2019-11-21 MED ORDER — PROPOFOL 10 MG/ML IV BOLUS
INTRAVENOUS | Status: DC | PRN
  Administered 2019-11-21: 140 mg via INTRAVENOUS

## 2019-11-21 MED ORDER — PROPOFOL 10 MG/ML IV BOLUS
INTRAVENOUS | Status: AC
  Filled 2019-11-21: qty 20

## 2019-11-21 MED ORDER — ONDANSETRON HCL 4 MG/2ML IJ SOLN
INTRAMUSCULAR | Status: DC | PRN
  Administered 2019-11-21: 4 mg via INTRAVENOUS

## 2019-11-21 MED ORDER — HYDROCODONE-ACETAMINOPHEN 5-325 MG PO TABS: 1.0000 | ORAL_TABLET | Freq: Four times a day (QID) | ORAL | 0 refills | Status: DC | PRN

## 2019-11-21 MED ORDER — PHENYLEPHRINE 40 MCG/ML (10ML) SYRINGE FOR IV PUSH (FOR BLOOD PRESSURE SUPPORT)
PREFILLED_SYRINGE | INTRAVENOUS | Status: DC | PRN
  Administered 2019-11-21: 80 ug via INTRAVENOUS

## 2019-11-21 MED ORDER — LACTATED RINGERS IV SOLN: INTRAVENOUS | Status: DC

## 2019-11-21 MED ORDER — CEFAZOLIN SODIUM-DEXTROSE 2-4 GM/100ML-% IV SOLN
2.0000 g | INTRAVENOUS | Status: AC
  Administered 2019-11-21: 2 g via INTRAVENOUS
  Filled 2019-11-21: qty 100

## 2019-11-21 MED ORDER — DEXAMETHASONE SODIUM PHOSPHATE 10 MG/ML IJ SOLN
INTRAMUSCULAR | Status: DC | PRN
  Administered 2019-11-21: 5 mg via INTRAVENOUS

## 2019-11-21 MED ORDER — MIDAZOLAM HCL 5 MG/5ML IJ SOLN
INTRAMUSCULAR | Status: DC | PRN
  Administered 2019-11-21: 2 mg via INTRAVENOUS

## 2019-11-21 SURGICAL SUPPLY — 41 items
APPLIER CLIP 5 13 M/L LIGAMAX5 (MISCELLANEOUS) ×3
APPLIER CLIP ROT 10 11.4 M/L (STAPLE)
CABLE HIGH FREQUENCY MONO STRZ (ELECTRODE) ×3 IMPLANT
CHLORAPREP W/TINT 26 (MISCELLANEOUS) ×3 IMPLANT
CHOLANGIOGRAM CATH TAUT (CATHETERS) ×3 IMPLANT
CLIP APPLIE 5 13 M/L LIGAMAX5 (MISCELLANEOUS) ×1 IMPLANT
CLIP APPLIE ROT 10 11.4 M/L (STAPLE) IMPLANT
CLOSURE WOUND 1/4X4 (GAUZE/BANDAGES/DRESSINGS)
COVER MAYO STAND STRL (DRAPES) ×3 IMPLANT
COVER SURGICAL LIGHT HANDLE (MISCELLANEOUS) ×3 IMPLANT
COVER WAND RF STERILE (DRAPES) IMPLANT
DECANTER SPIKE VIAL GLASS SM (MISCELLANEOUS) ×3 IMPLANT
DERMABOND ADVANCED (GAUZE/BANDAGES/DRESSINGS) ×2
DERMABOND ADVANCED .7 DNX12 (GAUZE/BANDAGES/DRESSINGS) ×1 IMPLANT
DRAPE C-ARM 42X120 X-RAY (DRAPES) ×3 IMPLANT
ELECT REM PT RETURN 15FT ADLT (MISCELLANEOUS) ×3 IMPLANT
GLOVE SURG SYN 7.5  E (GLOVE) ×2
GLOVE SURG SYN 7.5 E (GLOVE) ×1 IMPLANT
GOWN STRL REUS W/TWL XL LVL3 (GOWN DISPOSABLE) ×9 IMPLANT
HEMOSTAT SURGICEL 4X8 (HEMOSTASIS) IMPLANT
IV CATH 14GX2 1/4 (CATHETERS) ×3 IMPLANT
IV SET EXTENSION CATH 6 NF (IV SETS) ×3 IMPLANT
KIT BASIN OR (CUSTOM PROCEDURE TRAY) ×3 IMPLANT
KIT TURNOVER KIT A (KITS) IMPLANT
PENCIL SMOKE EVACUATOR (MISCELLANEOUS) IMPLANT
POUCH RETRIEVAL ECOSAC 10 (ENDOMECHANICALS) ×1 IMPLANT
POUCH RETRIEVAL ECOSAC 10MM (ENDOMECHANICALS) ×2
SCISSORS LAP 5X35 DISP (ENDOMECHANICALS) ×3 IMPLANT
SET IRRIG TUBING LAPAROSCOPIC (IRRIGATION / IRRIGATOR) ×3 IMPLANT
SET TUBE SMOKE EVAC HIGH FLOW (TUBING) ×3 IMPLANT
SLEEVE ADV FIXATION 5X100MM (TROCAR) ×6 IMPLANT
STOPCOCK 4 WAY LG BORE MALE ST (IV SETS) ×3 IMPLANT
STRIP CLOSURE SKIN 1/4X4 (GAUZE/BANDAGES/DRESSINGS) IMPLANT
SUT MNCRL AB 4-0 PS2 18 (SUTURE) ×3 IMPLANT
SYR 10ML ECCENTRIC (SYRINGE) ×3 IMPLANT
TOWEL OR 17X26 10 PK STRL BLUE (TOWEL DISPOSABLE) ×3 IMPLANT
TOWEL OR NON WOVEN STRL DISP B (DISPOSABLE) ×3 IMPLANT
TRAY LAPAROSCOPIC (CUSTOM PROCEDURE TRAY) ×3 IMPLANT
TROCAR ADV FIXATION 11X100MM (TROCAR) IMPLANT
TROCAR ADV FIXATION 5X100MM (TROCAR) ×3 IMPLANT
TROCAR XCEL BLUNT TIP 100MML (ENDOMECHANICALS) ×3 IMPLANT

## 2019-11-21 NOTE — Op Note (Signed)
11/21/2019  11:37 AM  PATIENT:  Emily Joseph, 49 y.o., female, MRN: [id number]  PREOP DIAGNOSIS:  SYMPTOMATIC CHOLELITHIASIS  POSTOP DIAGNOSIS:   Chronic cholecystitis, cholelithiasis  PROCEDURE:   Procedure(s): LAPAROSCOPIC CHOLECYSTECTOMY WITH INTRAOPERATIVE CHOLANGIOGRAM  SURGEON:   Alphonsa Overall, M.D.  Terrence DupontOk Anis, M.D.  ANESTHESIA:   general  Anesthesiologist: Catalina Gravel, MD CRNA: Anne Fu, CRNA  General  ASA: 3  EBL:  minimal  ml  BLOOD ADMINISTERED: none  DRAINS: none   LOCAL MEDICATIONS USED:   30 cc of 1/4% marcaine  SPECIMEN:   Gall bladder  COUNTS CORRECT:  YES  INDICATIONS FOR PROCEDURE:  Emily Joseph is a 49 y.o. (DOB: 06/17/1971) white female whose primary care physician is Lucretia Kern, DO and comes for cholecystectomy.   The indications and risks of the gall bladder surgery were explained to the patient.  The risks include, but are not limited to, infection, bleeding, common bile duct injury and open surgery.  SURGERY:  The patient was taken to OR room #[facility] at [facility].  The abdomen was prepped with chloroprep.  The patient was given 2 gm Ancef at the beginning of the operation.   A time out was held and the surgical checklist run.   An infraumbilical incision was made into the abdominal cavity.  A 12 mm Hasson trocar was inserted into the abdominal cavity through the infraumbilical incision and secured with a 0 Vicryl suture.  Three additional trocars were inserted: a 5 mm trocar in the sub-xiphoid location, a 5 mm trocar in the right mid subcostal area, and a 5 mm trocar in the right lateral subcostal area.   The abdomen was explored and the liver, stomach, and bowel that could be seen were unremarkable.   The gall bladder was white and appeared chronically inflamed.   I grasped the gall bladder and rotated it cephalad.  Disssection was carried down to the gall bladder/cystic duct junction and the cystic duct  isolated.  A clip was placed on the gall bladder side of the cystic duct.   An intra-operative cholangiogram was shot.   The intra-operative cholangiogram was shot using a cut off Taut catheter placed through a 14 gauge angiocath in the RUQ.  The Taut catheter was inserted in the cut cystic duct and secured with an endoclip.  A cholangiogram was shot with 8 cc of 1/2 strength Isoview.  Using fluoroscopy, the cholangiogram showed the flow of contrast into the common bile duct, up the hepatic radicals, and into the duodenum.  There was no mass or obstruction.  This was a normal intra-operative cholangiogram.   The Taut catheter was removed.  The cystic duct was tripley endoclipped and the cystic artery was identified and clipped.  The gall bladder was bluntly and sharpley dissected from the gall bladder bed.   After the gall bladder was removed from the liver, the gall bladder bed and Triangle of Calot were inspected.  There was no bleeding or bile leak.  The gall bladder was placed in a Ecco Sac bag and delivered through the umbilicus.  The abdomen was irrigated with 500 cc saline.   The trocars were then removed.  I infiltrated 30 cc of 1/4% Marcaine into the incisions.  The umbilical port closed with a 0 Vicryl suture and the skin closed with 4-0 Monocryl.  The skin was painted with DermaBond.  The patient's sponge and needle count were correct.  The patient was  transported to the RR in good condition.  Alphonsa Overall, MD, [facility] Pager: [phone] Office phone:  [phone]

## 2019-11-21 NOTE — Interval H&P Note (Signed)
History and Physical Interval Note:  11/21/2019 9:56 AM  Emily Joseph  has presented today for surgery, with the diagnosis of SYMPTOMATIC CHOLELITHIASIS.  The various methods of treatment have been discussed with the patient and family.  Patient's husband is waiting outside.  After consideration of risks, benefits and other options for treatment, the patient has consented to  Procedure(s): LAPAROSCOPIC CHOLECYSTECTOMY WITH INTRAOPERATIVE CHOLANGIOGRAM (N/A) as a surgical intervention.  The patient's history has been reviewed, patient examined, no change in status, stable for surgery.  I have reviewed the patient's chart and labs.  Questions were answered to the patient's satisfaction.     Shann Medal

## 2019-11-21 NOTE — Anesthesia Preprocedure Evaluation (Signed)
Anesthesia Evaluation  Patient identified by MRN, date of birth, ID band Patient awake    Reviewed: Allergy & Precautions, NPO status , Patient's Chart, lab work & pertinent test results, reviewed documented beta blocker date and time   Airway Mallampati: II  TM Distance: >3 FB Neck ROM: Full    Dental  (+) Teeth Intact, Dental Advisory Given   Pulmonary shortness of breath, asthma ,    Pulmonary exam normal breath sounds clear to auscultation       Cardiovascular Normal cardiovascular exam+ dysrhythmias Supra Ventricular Tachycardia  Rhythm:Regular Rate:Normal  s/p minimally invasive resection of right atrial lipoma   Neuro/Psych PSYCHIATRIC DISORDERS Anxiety negative neurological ROS     GI/Hepatic GERD  ,SYMPTOMATIC CHOLELITHIASIS   Endo/Other  negative endocrine ROS  Renal/GU negative Renal ROS     Musculoskeletal  (+) Arthritis , Fibromyalgia -  Abdominal   Peds  Hematology negative hematology ROS (+)   Anesthesia Other Findings Day of surgery medications reviewed with the patient.  Reproductive/Obstetrics                             Anesthesia Physical Anesthesia Plan  ASA: III  Anesthesia Plan: General   Post-op Pain Management:    Induction: Intravenous  PONV Risk Score and Plan: 4 or greater and Midazolam, Ondansetron, Dexamethasone and Diphenhydramine  Airway Management Planned: Oral ETT  Additional Equipment:   Intra-op Plan:   Post-operative Plan: Extubation in OR  Informed Consent: I have reviewed the patients History and Physical, chart, labs and discussed the procedure including the risks, benefits and alternatives for the proposed anesthesia with the patient or authorized representative who has indicated his/her understanding and acceptance.     Dental advisory given  Plan Discussed with: CRNA  Anesthesia Plan Comments:         Anesthesia Quick  Evaluation

## 2019-11-21 NOTE — Anesthesia Procedure Notes (Signed)
Procedure Name: Intubation Date/Time: 11/21/2019 10:35 AM Performed by: Anne Fu, CRNA Pre-anesthesia Checklist: Patient identified, Emergency Drugs available, Suction available, Patient being monitored and Timeout performed Patient Re-evaluated:Patient Re-evaluated prior to induction Oxygen Delivery Method: Circle system utilized Preoxygenation: Pre-oxygenation with 100% oxygen Induction Type: IV induction Ventilation: Mask ventilation without difficulty Laryngoscope Size: Mac and 3 Grade View: Grade I Tube type: Oral Tube size: 7.0 mm Number of attempts: 1 Airway Equipment and Method: Stylet Placement Confirmation: ETT inserted through vocal cords under direct vision,  positive ETCO2 and breath sounds checked- equal and bilateral Secured at: 22 cm Tube secured with: Tape Dental Injury: Teeth and Oropharynx as per pre-operative assessment

## 2019-11-21 NOTE — Discharge Instructions (Signed)
[facility] - DISCHARGE INSTRUCTIONS TO PATIENT  Activity:  Driving - May drive in 3 to 5 days, if doing well   Lifting - No lifting more than 15 pounds for 10 days                       Practice your Covid-19 protection:  Wear a mask, social distance, and wash your hands frequently  Wound Care:   Leave the incision dry for 2 days, then you may shower  Diet:  As tolerated  Follow up appointment:  Call Dr. Pollie Friar office [facility]) at [phone] for an appointment in 2 to 4 weeks.  Medications and dosages:  Resume your home medications.  You have a prescription for:  Vicodin  Call Dr. Lucia Gaskins or his office  [phone]) if you have:  Temperature greater than 100.4,  Persistent nausea and vomiting,  Severe uncontrolled pain,  Redness, tenderness, or signs of infection (pain, swelling, redness, odor or green/yellow discharge around the site),  Difficulty breathing, headache or visual disturbances,  Any other questions or concerns you may have after discharge.  In an emergency, call 911 or go to an Emergency Department at a nearby hospital.

## 2019-11-21 NOTE — Transfer of Care (Signed)
Immediate Anesthesia Transfer of Care Note  Patient: Emily Joseph  Procedure(s) Performed: Procedure(s): LAPAROSCOPIC CHOLECYSTECTOMY WITH INTRAOPERATIVE CHOLANGIOGRAM (N/A)  Patient Location: PACU  Anesthesia Type:General  Level of Consciousness:  sedated, patient cooperative and responds to stimulation  Airway & Oxygen Therapy:Patient Spontanous Breathing and Patient connected to face mask oxgen  Post-op Assessment:  Report given to PACU RN and Post -op Vital signs reviewed and stable  Post vital signs:  Reviewed and stable  Last Vitals:  Vitals:   11/21/19 0913 11/21/19 1145  BP: 112/68 (!) (P) 125/56  Pulse: 91 (P) 89  Resp: 14 (P) 16  Temp: 36.6 C (P) 36.7 C  SpO2: 100% (P) [id number]    Complications: No apparent anesthesia complications

## 2019-11-21 NOTE — Anesthesia Postprocedure Evaluation (Signed)
Anesthesia Post Note  Patient: Emily Joseph  Procedure(s) Performed: LAPAROSCOPIC CHOLECYSTECTOMY WITH INTRAOPERATIVE CHOLANGIOGRAM (N/A )     Patient location during evaluation: PACU Anesthesia Type: General Level of consciousness: awake and alert, oriented and awake Pain management: pain level controlled Vital Signs Assessment: post-procedure vital signs reviewed and stable Respiratory status: spontaneous breathing, nonlabored ventilation and respiratory function stable Cardiovascular status: blood pressure returned to baseline and stable Postop Assessment: no apparent nausea or vomiting Anesthetic complications: no    Last Vitals:  Vitals:   11/21/19 1245 11/21/19 1302  BP: 118/63 126/67  Pulse: 71 81  Resp: 12   Temp: 36.7 C 36.4 C  SpO2: 98% 100%    Last Pain:  Vitals:   11/21/19 1245  TempSrc:   PainSc: 2                  Catalina Gravel

## 2019-11-22 LAB — SURGICAL PATHOLOGY

## 2019-11-28 ENCOUNTER — Telehealth: Payer: Self-pay | Admitting: *Deleted

## 2019-11-28 ENCOUNTER — Encounter: Payer: Self-pay | Admitting: Family Medicine

## 2019-11-28 ENCOUNTER — Telehealth: Payer: Self-pay | Admitting: Cardiology

## 2019-11-28 NOTE — Telephone Encounter (Signed)
Heart rate is fine. I assume she is having ups and downs from pain. I think this is more of a pcp issue.....  [place] T. Audie Box, [place]  [address], [place], [place] [address] [phone]  11:35 AM

## 2019-11-28 NOTE — Telephone Encounter (Signed)
STAT if HR is under 50 or over 120 (normal HR is 60-100 beats per minute)  1) What is your heart rate? 105  2) Do you have a log of your heart rate readings (document readings)? No, last night 122, 45, 114,   3) Do you have any other symptoms? Spiking heart rate.  Patient states she has a racy heart and her HR has been spiking since her gallbladder surgery. She states Saturday her HR kept going up and went up to 151 but metoprolol and food helped get it down. She is concerned about her Blood sugar and believes she needs her labs done. She would also like to know if her metoprolol should be increased. Patient states she also had a squeezing chest tightness the last few times she took metoprolol. She also believes she may have undone the suture in her navel. She states she will also call Dr. Lucia Gaskins about it today. Patient states she is very concerned something is wrong and she is having a hard time healing and that she feels weak. She would like a nurse to call her back with advice.

## 2019-11-28 NOTE — Telephone Encounter (Signed)
Copied from [place] [phone]. Topic: General - Other >> Nov 28, 2019 12:15 PM Rainey Pines A wrote: Patient would like a callback in regards to her mychart message and would like to have an order placed for labs. Please advise. Best contact [phone]

## 2019-11-28 NOTE — Telephone Encounter (Signed)
Patient called with MD advice. She voiced understanding and is working on getting thru to PCP Routed to primary cardiologist as Juluis Rainier

## 2019-11-28 NOTE — Telephone Encounter (Signed)
Returned call to patient of Dr. Stanford Breed. She had gallbladder surgery last Monday. Wednesday/Thursday last week she was still feeling bad but her surgical pain is controlled. Saturday AM she almost called 911. Her HR was elevated at rest, got up to 151 when she got up from recliner and she reports it kept spiking. She had a BM Saturday and was unsure if she was having vasovagal symptoms. She took metoprolol a few hours early and HR dropped from 115 to 70s and she has since been having to take meto tart earlier than about every 12 hours d/t HR. She also thinks there is something wrong with her blood sugar and she had to have insulin during her cardiac surgery. She would like labs to check her blood sugar - advised that PCP should order those labs. She is asking if her meds need to be adjusted as well  This AM, HR was 105-113bpm  Her HR runs from 49-80s/90s normally  Her normal BP range is 100s/60s  Advised will notify DOD as Dr. Stanford Breed is on vacation

## 2019-11-28 NOTE — Telephone Encounter (Signed)
Check ECG in office to R/O atrial fibrillation or flutter Kirk Ruths

## 2019-11-29 NOTE — Telephone Encounter (Signed)
See My chart message

## 2019-11-29 NOTE — Telephone Encounter (Signed)
Spoke with pt, she is feeling much better today and her heart rate is 84 currently. Yesterday her heart rate ran between 60 to 80's. She is aware to go to urgent care or ER if happens again for ECG.

## 2019-12-06 ENCOUNTER — Telehealth: Payer: Self-pay

## 2019-12-06 NOTE — Telephone Encounter (Signed)
Please assist patient with transfer of care appointment, thanks.

## 2019-12-06 NOTE — Telephone Encounter (Signed)
Ok  With me 

## 2019-12-06 NOTE — Telephone Encounter (Signed)
Ok

## 2019-12-06 NOTE — Telephone Encounter (Signed)
Patient would like to transfer care to [facility] to Dr. Raoul Pitch.

## 2019-12-06 NOTE — Telephone Encounter (Signed)
Okay for patient to transfer to you?  Please advise, thanks.

## 2019-12-08 NOTE — Telephone Encounter (Signed)
Patient has been scheduled for 12/13/19

## 2019-12-12 ENCOUNTER — Other Ambulatory Visit: Payer: Self-pay

## 2019-12-13 ENCOUNTER — Ambulatory Visit (INDEPENDENT_AMBULATORY_CARE_PROVIDER_SITE_OTHER): Payer: BC Managed Care – PPO | Admitting: Family Medicine

## 2019-12-13 ENCOUNTER — Encounter: Payer: Self-pay | Admitting: Family Medicine

## 2019-12-13 VITALS — BP 101/70 | HR 75 | Temp 98.4°F | Resp 16 | Ht 67.0 in | Wt 155.5 lb

## 2019-12-13 DIAGNOSIS — I4892 Unspecified atrial flutter: Secondary | ICD-10-CM | POA: Diagnosis not present

## 2019-12-13 DIAGNOSIS — R7309 Other abnormal glucose: Secondary | ICD-10-CM

## 2019-12-13 NOTE — Patient Instructions (Addendum)
COVID-19 Vaccine Information can be found at: [email or web address] For questions related to vaccine distribution or appointments, please email [email or web address] or call [phone].  Covid Vaccine appointment go to [email or web address].  Make sure to schedule you annual physicals every year. This is when we can order all your labs and keep you on track with your preventive care.   Please help [place] help you:  We are honored you have chosen [facility] for your Primary Care home. Below you will find basic instructions that you may need to access in the future. Please help [place] help you by reading the instructions, which cover many of the frequent questions we experience.   Prescription refills and request:  -In order to allow more efficient response time, please call your pharmacy for all refills. They will forward the request electronically to [place]. This allows for the quickest possible response. Request left on a nurse line can take longer to refill, since these are checked as time allows between office patients and other phone calls.  - refill request can take up to 3-5 working days to complete.  - If request is sent electronically and request is appropiate, it is usually completed in 1-2 business days.  - all patients will need to be seen routinely for all chronic medical conditions requiring prescription medications (see follow-up below). If you are overdue for follow up on your condition, you will be asked to make an appointment and we will call in enough medication to cover you until your appointment (up to 30 days).  - all controlled substances will require a face to face visit to request/refill.  - if you desire your prescriptions to go through a new pharmacy, and have an active script at original pharmacy, you will need to call your pharmacy and have scripts transferred to new pharmacy. This is completed between the pharmacy  locations and not by your provider.    Results: If any images or labs were ordered, it can take up to 1 week to get results depending on the test ordered and the lab/facility running and resulting the test. - Normal or stable results, which do not need further discussion, may be released to your mychart immediately with attached note to you. A call may not be generated for normal results. Please make certain to sign up for mychart. If you have questions on how to activate your mychart you can call the front office.  - If your results need further discussion, our office will attempt to contact you via phone, and if unable to reach you after 2 attempts, we will release your abnormal result to your mychart with instructions.  - All results will be automatically released in mychart after 1 week.  - Your provider will provide you with explanation and instruction on all relevant material in your results. Please keep in mind, results and labs may appear confusing or abnormal to the untrained eye, but it does not mean they are actually abnormal for you personally. If you have any questions about your results that are not covered, or you desire more detailed explanation than what was provided, you should make an appointment with your provider to do so.   Our office handles many outgoing and incoming calls daily. If we have not contacted you within 1 week about your results, please check your mychart to see if there is a message first and if not, then contact our office.  In helping with this matter, you help decrease call volume, and therefore allow [place]  to be able to respond to patients needs more efficiently.   Acute office visits (sick visit):  An acute visit is intended for a new problem and are scheduled in shorter time slots to allow schedule openings for patients with new problems. This is the appropriate visit to discuss a new problem. Problems will not be addressed by phone call or Echart message.  Appointment is needed if requesting treatment. In order to provide you with excellent quality medical care with proper time for you to explain your problem, have an exam and receive treatment with instructions, these appointments should be limited to one new problem per visit. If you experience a new problem, in which you desire to be addressed, please make an acute office visit, we save openings on the schedule to accommodate you. Please do not save your new problem for any other type of visit, let us take care of it properly and quickly for you.   Follow up visits:  Depending on your condition(s) your provider will need to see you routinely in order to provide you with quality care and prescribe medication(s). Most chronic conditions (Example: hypertension, Diabetes, depression/anxiety... etc), require visits a couple times a year. Your provider will instruct you on proper follow up for your personal medical conditions and history. Please make certain to make follow up appointments for your condition as instructed. Failing to do so could result in lapse in your medication treatment/refills. If you request a refill, and are overdue to be seen on a condition, we will always provide you with a 30 day script (once) to allow you time to schedule.    Medicare wellness (well visit): - we have a wonderful Nurse Maudie Mercury), that will meet with you and provide you will yearly medicare wellness visits. These visits should occur yearly (can not be scheduled less than 1 calendar year apart) and cover preventive health, immunizations, advance directives and screenings you are entitled to yearly through your medicare benefits. Do not miss out on your entitled benefits, this is when medicare will pay for these benefits to be ordered for you.  These are strongly encouraged by your provider and is the appropriate type of visit to make certain you are up to date with all preventive health benefits. If you have not had your  medicare wellness exam in the last 12 months, please make certain to schedule one by calling the office and schedule your medicare wellness with Maudie Mercury as soon as possible.   Yearly physical (well visit):  - Adults are recommended to be seen yearly for physicals. Check with your insurance and date of your last physical, most insurances require one calendar year between physicals. Physicals include all preventive health topics, screenings, medical exam and labs that are appropriate for gender/age and history. You may have fasting labs needed at this visit. This is a well visit (not a sick visit), new problems should not be covered during this visit (see acute visit).  - Pediatric patients are seen more frequently when they are younger. Your provider will advise you on well child visit timing that is appropriate for your their age. - This is not a medicare wellness visit. Medicare wellness exams do not have an exam portion to the visit. Some medicare companies allow for a physical, some do not allow a yearly physical. If your medicare allows a yearly physical you can schedule the medicare wellness with our nurse Maudie Mercury and have your physical with your provider after, on the same day. Please check  with insurance for your full benefits.   Late Policy/No Shows:  - all new patients should arrive 15-30 minutes earlier than appointment to allow [place] time  to  obtain all personal demographics,  insurance information and for you to complete office paperwork. - All established patients should arrive 10-15 minutes earlier than appointment time to update all information and be checked in .  - In our best efforts to run on time, if you are late for your appointment you will be asked to either reschedule or if able, we will work you back into the schedule. There will be a wait time to work you back in the schedule,  depending on availability.  - If you are unable to make it to your appointment as scheduled, please call 24 hours  ahead of time to allow [place] to fill the time slot with someone else who needs to be seen. If you do not cancel your appointment ahead of time, you may be charged a no show fee.
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
## 2019-12-14 ENCOUNTER — Telehealth: Payer: Self-pay

## 2019-12-14 LAB — LIPID PANEL
Cholesterol: 176 mg/dL (ref <200)
HDL: 36 mg/dL — ABNORMAL LOW (ref >=50)
LDL Cholesterol (Calc): 92 mg/dL (calc)
Non-HDL Cholesterol (Calc): 140 mg/dL (calc) — ABNORMAL HIGH (ref <130)
Total CHOL/HDL Ratio: 4.9 (calc) (ref <5.0)
Triglycerides: 360 mg/dL — ABNORMAL HIGH (ref <150)

## 2019-12-14 LAB — HEMOGLOBIN A1C
Hgb A1c MFr Bld: 5.3 % of total Hgb (ref <5.7)
Mean Plasma Glucose: 105 (calc)
eAG (mmol/L): 5.8 (calc)

## 2019-12-14 LAB — TSH: TSH: 1.21 mIU/L

## 2019-12-14 LAB — T4, FREE: Free T4: 1.1 ng/dL (ref 0.8–1.8)

## 2019-12-14 LAB — T3, FREE: T3, Free: 3.1 pg/mL (ref 2.3–4.2)

## 2019-12-14 NOTE — Telephone Encounter (Signed)
Patient wants to know if potassium can be checked on the blood that was drawn yesterday. I told her I didn't think so because it depends on the amount blood, where we send off for testing, and other things...she said she would like to come back in to do labs.  Please advise.  Please call [phone].  Thank you

## 2019-12-14 NOTE — Telephone Encounter (Signed)
Pt was called and this lab was just checked on 11/16/19. She verbalized understanding that this did not need to be checked again since she was not having any problems. She verbalized understanding

## 2019-12-15 ENCOUNTER — Encounter: Payer: Self-pay | Admitting: Family Medicine

## 2020-02-01 DIAGNOSIS — M545 Low back pain: Secondary | ICD-10-CM | POA: Diagnosis not present

## 2020-02-06 DIAGNOSIS — M545 Low back pain: Secondary | ICD-10-CM | POA: Diagnosis not present

## 2020-02-08 DIAGNOSIS — M545 Low back pain: Secondary | ICD-10-CM | POA: Diagnosis not present

## 2020-02-09 ENCOUNTER — Telehealth: Payer: Self-pay

## 2020-02-09 NOTE — Telephone Encounter (Signed)
Pt was called and appt was scheduled with PCP

## 2020-02-09 NOTE — Telephone Encounter (Signed)
Patient is having a lot pain all over. Her hands & elbows are swelling. Patient is requesting a referral to Dr. Posey Pronto, a rheumatologist in [place].

## 2020-02-13 DIAGNOSIS — M545 Low back pain: Secondary | ICD-10-CM | POA: Diagnosis not present

## 2020-02-15 ENCOUNTER — Other Ambulatory Visit: Payer: Self-pay

## 2020-02-15 ENCOUNTER — Ambulatory Visit (INDEPENDENT_AMBULATORY_CARE_PROVIDER_SITE_OTHER): Payer: BC Managed Care – PPO | Admitting: Family Medicine

## 2020-02-15 ENCOUNTER — Encounter: Payer: Self-pay | Admitting: Family Medicine

## 2020-02-15 ENCOUNTER — Telehealth: Payer: Self-pay | Admitting: Cardiology

## 2020-02-15 VITALS — BP 107/72 | HR 79 | Temp 98.0°F | Resp 17 | Ht 67.0 in | Wt 157.0 lb

## 2020-02-15 DIAGNOSIS — M79643 Pain in unspecified hand: Secondary | ICD-10-CM

## 2020-02-15 DIAGNOSIS — M25522 Pain in left elbow: Secondary | ICD-10-CM

## 2020-02-15 DIAGNOSIS — M25521 Pain in right elbow: Secondary | ICD-10-CM | POA: Diagnosis not present

## 2020-02-15 DIAGNOSIS — M7989 Other specified soft tissue disorders: Secondary | ICD-10-CM | POA: Diagnosis not present

## 2020-02-15 LAB — C-REACTIVE PROTEIN: CRP: <1 mg/dL (ref 0.5–20.0)

## 2020-02-15 LAB — SEDIMENTATION RATE: Sed Rate: 9 mm/hr (ref 0–20)
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
## 2020-02-15 NOTE — Patient Instructions (Signed)
We will call you with lab results. Once we get the results back we will call you and discuss further plan.   Arthritis Arthritis means joint pain. It can also mean joint disease. A joint is a place where bones come together. There are more than 100 types of arthritis. What are the causes? This condition may be caused by:  Wear and tear of a joint. This is the most common cause.  A lot of acid in the blood, which leads to pain in the joint (gout).  Pain and swelling (inflammation) in a joint.  Infection of a joint.  Injuries in the joint.  A reaction to medicines (allergy). In some cases, the cause may not be known. What are the signs or symptoms? Symptoms of this condition include:  Redness at a joint.  Swelling at a joint.  Stiffness at a joint.  Warmth coming from the joint.  A fever.  A feeling of being sick. How is this treated? This condition may be treated with:  Treating the cause, if it is known.  Rest.  Raising (elevating) the joint.  Putting cold or hot packs on the joint.  Medicines to treat symptoms and reduce pain and swelling.  Shots of medicines (cortisone) into the joint. You may also be told to make changes in your life, such as doing exercises and losing weight. Follow these instructions at home: Medicines  Take over-the-counter and prescription medicines only as told by your doctor.  Do not take aspirin for pain if your doctor says that you may have gout. Activity  Rest your joint if your doctor tells you to.  Avoid activities that make the pain worse.  Exercise your joint regularly as told by your doctor. Try doing exercises like: ? Swimming. ? Water aerobics. ? Biking. ? Walking. Managing pain, stiffness, and swelling      If told, put ice on the affected area. ? Put ice in a plastic bag. ? Place a towel between your skin and the bag. ? Leave the ice on for 20 minutes, 2-3 times per day.  If your joint is swollen, raise  (elevate) it above the level of your heart if told by your doctor.  If your joint feels stiff in the morning, try taking a warm shower.  If told, put heat on the affected area. Do this as often as told by your doctor. Use the heat source that your doctor recommends, such as a moist heat pack or a heating pad. If you have diabetes, do not apply heat without asking your doctor. To apply heat: ? Place a towel between your skin and the heat source. ? Leave the heat on for 20-30 minutes. ? Remove the heat if your skin turns bright red. This is very important if you are unable to feel pain, heat, or cold. You may have a greater risk of getting burned. General instructions  Do not use any products that contain nicotine or tobacco, such as cigarettes, e-cigarettes, and chewing tobacco. If you need help quitting, ask your doctor.  Keep all follow-up visits as told by your doctor. This is important. Contact a doctor if:  The pain gets worse.  You have a fever. Get help right away if:  You have very bad pain in your joint.  You have swelling in your joint.  Your joint is red.  Many joints become painful and swollen.  You have very bad back pain.  Your leg is very weak.  You cannot control  your pee (urine) or poop (stool). Summary  Arthritis means joint pain. It can also mean joint disease. A joint is a place where bones come together.  The most common cause of this condition is wear and tear of a joint.  Symptoms of this condition include redness, swelling, or stiffness of the joint.  This condition is treated with rest, raising the joint, medicines, and putting cold or hot packs on the joint.  Follow your doctor's instructions about medicines, activity, exercises, and other home care treatments. This information is not intended to replace advice given to you by your health care provider. Make sure you discuss any questions you have with your health care provider. Document Revised:  10/04/2018 Document Reviewed: 10/04/2018 Elsevier Patient Education  2020 [employer].

## 2020-02-15 NOTE — Telephone Encounter (Signed)
Spoke with pt, notified that she is able to take tylenol or advil, she states that she has been taking tylenol but this has not touched her pain. She stated she saw Dr.Kuneff today for this new onset sharp arthritis pain and swelling and she had blood drawn. She would like Dr.Crenshaw to review this as well when they are resulted. She verbalized understanding with taking the advil and had no other questions at this time.  Will make Dr.Crenshaw aware.

## 2020-02-15 NOTE — Telephone Encounter (Signed)
New Message  Patient is having arthritis symptoms and wants to know what kind of anti-inflammatory (like advil or aspirin) to assist with the pain for right now until bloodwork and test results come back to pcp. Please give patient a call back to advise.

## 2020-02-15 NOTE — Telephone Encounter (Signed)
Patient has no CAD, can use acetaminophen or ibuprofen for now.

## 2020-02-17 LAB — ANA, IFA COMPREHENSIVE PANEL
Anti Nuclear Antibody (ANA): POSITIVE — AB
ENA SM Ab Ser-aCnc: <1 AI
SM/RNP: <1 AI
SSA (Ro) (ENA) Antibody, IgG: <1 AI
SSB (La) (ENA) Antibody, IgG: <1 AI
Scleroderma (Scl-70) (ENA) Antibody, IgG: <1 AI
ds DNA Ab: 1 IU/mL

## 2020-02-17 LAB — ANTI-NUCLEAR AB-TITER (ANA TITER): ANA Titer 1: 1:40 {titer} — ABNORMAL HIGH

## 2020-02-17 LAB — CYCLIC CITRUL PEPTIDE ANTIBODY, IGG: Cyclic Citrullin Peptide Ab: <16 UNITS

## 2020-02-17 LAB — RHEUMATOID FACTOR: Rheumatoid fact SerPl-aCnc: 18 IU/mL — ABNORMAL HIGH (ref <14)

## 2020-02-20 ENCOUNTER — Telehealth: Payer: Self-pay | Admitting: Family Medicine

## 2020-02-20 DIAGNOSIS — M0580 Other rheumatoid arthritis with rheumatoid factor of unspecified site: Secondary | ICD-10-CM

## 2020-02-20 NOTE — Telephone Encounter (Signed)
Please inform patient the following Her inflammatory markers are normal Her ANA (autoimmune screen )is positive, but it is very weakly positive. Her rheumatoid factor is also weakly positive.  Given the positive rheumatoid factor I have referred her to rheumatology for further evaluation. We discussed during her visit considering start of low-dose Cymbalta to help with her discomfort.  If she would still like to try this medication we can call this in for her today follow-up in 8 weeks.

## 2020-02-27 ENCOUNTER — Encounter: Payer: Self-pay | Admitting: Rheumatology

## 2020-04-04 ENCOUNTER — Telehealth: Payer: Self-pay

## 2020-04-04 NOTE — Telephone Encounter (Signed)
Patient called in wanting to know if Dr would send another referral to another Dr for [place]. She has an appt set with Dr. Estanislado Pandy. But that's not until August and she says from what her blood work came back in April and increased swelling she would preferre not to wait that long. She is asking if Dr can send another referral to Dr. Serita Grit office to see if she can get a sooner appt. Patient didn't have good experience with scheduling with Dr. Estanislado Pandy, so she is really wanting to see if another referral can be sent out.    Please call and advise

## 2020-04-05 NOTE — Telephone Encounter (Signed)
Emily Joseph can we see if this pt can go to another office? Or would a new referral need to be placed. I will call pt and let her know that there is normally that long of a wait for Rheumatology.

## 2020-04-05 NOTE — Telephone Encounter (Signed)
I can change the referral for Emily Joseph's office is she is absolutely not going to keep that appt.  If there is a chance she will still want it, then I will need a new referral placed.  Let me know!

## 2020-04-05 NOTE — Telephone Encounter (Signed)
Pt was called and she was told that Rheumatologist typically have that long of wait to be seen and referrals are reviewed and scheduled based on that. She was told we would have to cancel the one appt to do the other referral. She was told she could call Dr Posey Pronto office and see how far out they are scheduling NP appt. Pt wanted to call the other office and and see when they have openings and she will call back if she wants the referral changed.

## 2020-04-10 DIAGNOSIS — M5384 Other specified dorsopathies, thoracic region: Secondary | ICD-10-CM | POA: Diagnosis not present

## 2020-04-10 DIAGNOSIS — M50122 Cervical disc disorder at C5-C6 level with radiculopathy: Secondary | ICD-10-CM | POA: Diagnosis not present

## 2020-04-10 DIAGNOSIS — M9901 Segmental and somatic dysfunction of cervical region: Secondary | ICD-10-CM | POA: Diagnosis not present

## 2020-04-10 DIAGNOSIS — M9902 Segmental and somatic dysfunction of thoracic region: Secondary | ICD-10-CM | POA: Diagnosis not present

## 2020-04-17 DIAGNOSIS — M9901 Segmental and somatic dysfunction of cervical region: Secondary | ICD-10-CM | POA: Diagnosis not present

## 2020-04-17 DIAGNOSIS — M5384 Other specified dorsopathies, thoracic region: Secondary | ICD-10-CM | POA: Diagnosis not present

## 2020-04-17 DIAGNOSIS — M9902 Segmental and somatic dysfunction of thoracic region: Secondary | ICD-10-CM | POA: Diagnosis not present

## 2020-04-17 DIAGNOSIS — M5032 Other cervical disc degeneration, mid-cervical region, unspecified level: Secondary | ICD-10-CM | POA: Diagnosis not present

## 2020-04-17 DIAGNOSIS — M50122 Cervical disc disorder at C5-C6 level with radiculopathy: Secondary | ICD-10-CM | POA: Diagnosis not present

## 2020-04-18 DIAGNOSIS — M5384 Other specified dorsopathies, thoracic region: Secondary | ICD-10-CM | POA: Diagnosis not present

## 2020-04-18 DIAGNOSIS — M9902 Segmental and somatic dysfunction of thoracic region: Secondary | ICD-10-CM | POA: Diagnosis not present

## 2020-04-18 DIAGNOSIS — M9901 Segmental and somatic dysfunction of cervical region: Secondary | ICD-10-CM | POA: Diagnosis not present

## 2020-04-18 DIAGNOSIS — M50122 Cervical disc disorder at C5-C6 level with radiculopathy: Secondary | ICD-10-CM | POA: Diagnosis not present

## 2020-04-19 DIAGNOSIS — M9902 Segmental and somatic dysfunction of thoracic region: Secondary | ICD-10-CM | POA: Diagnosis not present

## 2020-04-19 DIAGNOSIS — M5384 Other specified dorsopathies, thoracic region: Secondary | ICD-10-CM | POA: Diagnosis not present

## 2020-04-19 DIAGNOSIS — M9901 Segmental and somatic dysfunction of cervical region: Secondary | ICD-10-CM | POA: Diagnosis not present

## 2020-04-19 DIAGNOSIS — M50122 Cervical disc disorder at C5-C6 level with radiculopathy: Secondary | ICD-10-CM | POA: Diagnosis not present
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
## 2020-05-01 DIAGNOSIS — M5384 Other specified dorsopathies, thoracic region: Secondary | ICD-10-CM | POA: Diagnosis not present

## 2020-05-01 DIAGNOSIS — M50122 Cervical disc disorder at C5-C6 level with radiculopathy: Secondary | ICD-10-CM | POA: Diagnosis not present

## 2020-05-01 DIAGNOSIS — M9901 Segmental and somatic dysfunction of cervical region: Secondary | ICD-10-CM | POA: Diagnosis not present

## 2020-05-01 DIAGNOSIS — M9902 Segmental and somatic dysfunction of thoracic region: Secondary | ICD-10-CM | POA: Diagnosis not present

## 2020-05-03 DIAGNOSIS — M50122 Cervical disc disorder at C5-C6 level with radiculopathy: Secondary | ICD-10-CM | POA: Diagnosis not present

## 2020-05-03 DIAGNOSIS — M9902 Segmental and somatic dysfunction of thoracic region: Secondary | ICD-10-CM | POA: Diagnosis not present

## 2020-05-03 DIAGNOSIS — M9901 Segmental and somatic dysfunction of cervical region: Secondary | ICD-10-CM | POA: Diagnosis not present

## 2020-05-03 DIAGNOSIS — M5384 Other specified dorsopathies, thoracic region: Secondary | ICD-10-CM | POA: Diagnosis not present

## 2020-05-04 ENCOUNTER — Encounter: Payer: Self-pay | Admitting: Rheumatology

## 2020-05-04 ENCOUNTER — Ambulatory Visit (INDEPENDENT_AMBULATORY_CARE_PROVIDER_SITE_OTHER): Payer: BC Managed Care – PPO | Admitting: Rheumatology

## 2020-05-04 ENCOUNTER — Ambulatory Visit: Payer: Self-pay

## 2020-05-04 ENCOUNTER — Other Ambulatory Visit: Payer: Self-pay

## 2020-05-04 ENCOUNTER — Telehealth: Payer: Self-pay | Admitting: Cardiology

## 2020-05-04 VITALS — BP 107/69 | HR 80 | Resp 14 | Ht 67.25 in | Wt 149.0 lb

## 2020-05-04 DIAGNOSIS — M6289 Other specified disorders of muscle: Secondary | ICD-10-CM

## 2020-05-04 DIAGNOSIS — M797 Fibromyalgia: Secondary | ICD-10-CM

## 2020-05-04 DIAGNOSIS — M25562 Pain in left knee: Secondary | ICD-10-CM | POA: Diagnosis not present

## 2020-05-04 DIAGNOSIS — M79642 Pain in left hand: Secondary | ICD-10-CM

## 2020-05-04 DIAGNOSIS — R768 Other specified abnormal immunological findings in serum: Secondary | ICD-10-CM | POA: Diagnosis not present

## 2020-05-04 DIAGNOSIS — Z9109 Other allergy status, other than to drugs and biological substances: Secondary | ICD-10-CM

## 2020-05-04 DIAGNOSIS — K581 Irritable bowel syndrome with constipation: Secondary | ICD-10-CM

## 2020-05-04 DIAGNOSIS — K2100 Gastro-esophageal reflux disease with esophagitis, without bleeding: Secondary | ICD-10-CM

## 2020-05-04 DIAGNOSIS — F419 Anxiety disorder, unspecified: Secondary | ICD-10-CM

## 2020-05-04 DIAGNOSIS — R5382 Chronic fatigue, unspecified: Secondary | ICD-10-CM

## 2020-05-04 DIAGNOSIS — M79641 Pain in right hand: Secondary | ICD-10-CM

## 2020-05-04 DIAGNOSIS — G8929 Other chronic pain: Secondary | ICD-10-CM | POA: Diagnosis not present

## 2020-05-04 DIAGNOSIS — I5189 Other ill-defined heart diseases: Secondary | ICD-10-CM

## 2020-05-04 DIAGNOSIS — Z91018 Allergy to other foods: Secondary | ICD-10-CM

## 2020-05-04 NOTE — Telephone Encounter (Signed)
Left message for patient this was placed under her surgical hx by dr Roxy Manns. She will need to contact their office for any changes.

## 2020-05-04 NOTE — Patient Instructions (Signed)
Journal for Nurse Practitioners, 15(4), 263-267. Retrieved August 16, 2018 from [email or web address]">  Knee Exercises Ask your health care provider which exercises are safe for you. Do exercises exactly as told by your health care provider and adjust them as directed. It is normal to feel mild stretching, pulling, tightness, or discomfort as you do these exercises. Stop right away if you feel sudden pain or your pain gets worse. Do not begin these exercises until told by your health care provider. Stretching and range-of-motion exercises These exercises warm up your muscles and joints and improve the movement and flexibility of your knee. These exercises also help to relieve pain and swelling. Knee extension, prone 1. Lie on your abdomen (prone position) on a bed. 2. Place your left / right knee just beyond the edge of the surface so your knee is not on the bed. You can put a towel under your left / right thigh just above your kneecap for comfort. 3. Relax your leg muscles and allow gravity to straighten your knee (extension). You should feel a stretch behind your left / right knee. 4. Hold this position for __________ seconds. 5. Scoot up so your knee is supported between repetitions. Repeat __________ times. Complete this exercise __________ times a day. Knee flexion, active  1. Lie on your back with both legs straight. If this causes back discomfort, bend your left / right knee so your foot is flat on the floor. 2. Slowly slide your left / right heel back toward your buttocks. Stop when you feel a gentle stretch in the front of your knee or thigh (flexion). 3. Hold this position for __________ seconds. 4. Slowly slide your left / right heel back to the starting position. Repeat __________ times. Complete this exercise __________ times a day. Quadriceps stretch, prone  1. Lie on your abdomen on a firm surface, such as a bed or padded floor. 2. Bend your left / right knee and hold  your ankle. If you cannot reach your ankle or pant leg, loop a belt around your foot and grab the belt instead. 3. Gently pull your heel toward your buttocks. Your knee should not slide out to the side. You should feel a stretch in the front of your thigh and knee (quadriceps). 4. Hold this position for __________ seconds. Repeat __________ times. Complete this exercise __________ times a day. Hamstring, supine 1. Lie on your back (supine position). 2. Loop a belt or towel over the ball of your left / right foot. The ball of your foot is on the walking surface, right under your toes. 3. Straighten your left / right knee and slowly pull on the belt to raise your leg until you feel a gentle stretch behind your knee (hamstring). ? Do not let your knee bend while you do this. ? Keep your other leg flat on the floor. 4. Hold this position for __________ seconds. Repeat __________ times. Complete this exercise __________ times a day. Strengthening exercises These exercises build strength and endurance in your knee. Endurance is the ability to use your muscles for a long time, even after they get tired. Quadriceps, isometric This exercise stretches the muscles in front of your thigh (quadriceps) without moving your knee joint (isometric). 1. Lie on your back with your left / right leg extended and your other knee bent. Put a rolled towel or small pillow under your knee if told by your health care provider. 2. Slowly tense the muscles in the front of your left /   right thigh. You should see your kneecap slide up toward your hip or see increased dimpling just above the knee. This motion will push the back of the knee toward the floor. 3. For __________ seconds, hold the muscle as tight as you can without increasing your pain. 4. Relax the muscles slowly and completely. Repeat __________ times. Complete this exercise __________ times a day. Straight leg raises This exercise stretches the muscles in front  of your thigh (quadriceps) and the muscles that move your hips (hip flexors). 1. Lie on your back with your left / right leg extended and your other knee bent. 2. Tense the muscles in the front of your left / right thigh. You should see your kneecap slide up or see increased dimpling just above the knee. Your thigh may even shake a bit. 3. Keep these muscles tight as you raise your leg 4-6 inches (10-15 cm) off the floor. Do not let your knee bend. 4. Hold this position for __________ seconds. 5. Keep these muscles tense as you lower your leg. 6. Relax your muscles slowly and completely after each repetition. Repeat __________ times. Complete this exercise __________ times a day. Hamstring, isometric 1. Lie on your back on a firm surface. 2. Bend your left / right knee about __________ degrees. 3. Dig your left / right heel into the surface as if you are trying to pull it toward your buttocks. Tighten the muscles in the back of your thighs (hamstring) to "dig" as hard as you can without increasing any pain. 4. Hold this position for __________ seconds. 5. Release the tension gradually and allow your muscles to relax completely for __________ seconds after each repetition. Repeat __________ times. Complete this exercise __________ times a day. Hamstring curls If told by your health care provider, do this exercise while wearing ankle weights. Begin with __________ lb weights. Then increase the weight by 1 lb (0.5 kg) increments. Do not wear ankle weights that are more than __________ lb. 1. Lie on your abdomen with your legs straight. 2. Bend your left / right knee as far as you can without feeling pain. Keep your hips flat against the floor. 3. Hold this position for __________ seconds. 4. Slowly lower your leg to the starting position. Repeat __________ times. Complete this exercise __________ times a day. Squats This exercise strengthens the muscles in front of your thigh and knee  (quadriceps). 1. Stand in front of a table, with your feet and knees pointing straight ahead. You may rest your hands on the table for balance but not for support. 2. Slowly bend your knees and lower your hips like you are going to sit in a chair. ? Keep your weight over your heels, not over your toes. ? Keep your lower legs upright so they are parallel with the table legs. ? Do not let your hips go lower than your knees. ? Do not bend lower than told by your health care provider. ? If your knee pain increases, do not bend as low. 3. Hold the squat position for __________ seconds. 4. Slowly push with your legs to return to standing. Do not use your hands to pull yourself to standing. Repeat __________ times. Complete this exercise __________ times a day. Wall slides This exercise strengthens the muscles in front of your thigh and knee (quadriceps). 1. Lean your back against a smooth wall or door, and walk your feet out 18-24 inches (46-61 cm) from it. 2. Place your feet hip-width apart. 3.   Slowly slide down the wall or door until your knees bend __________ degrees. Keep your knees over your heels, not over your toes. Keep your knees in line with your hips. 4. Hold this position for __________ seconds. Repeat __________ times. Complete this exercise __________ times a day. Straight leg raises This exercise strengthens the muscles that rotate the leg at the hip and move it away from your body (hip abductors). 1. Lie on your side with your left / right leg in the top position. Lie so your head, shoulder, knee, and hip line up. You may bend your bottom knee to help you keep your balance. 2. Roll your hips slightly forward so your hips are stacked directly over each other and your left / right knee is facing forward. 3. Leading with your heel, lift your top leg 4-6 inches (10-15 cm). You should feel the muscles in your outer hip lifting. ? Do not let your foot drift forward. ? Do not let your knee  roll toward the ceiling. 4. Hold this position for __________ seconds. 5. Slowly return your leg to the starting position. 6. Let your muscles relax completely after each repetition. Repeat __________ times. Complete this exercise __________ times a day. Straight leg raises This exercise stretches the muscles that move your hips away from the front of the pelvis (hip extensors). 1. Lie on your abdomen on a firm surface. You can put a pillow under your hips if that is more comfortable. 2. Tense the muscles in your buttocks and lift your left / right leg about 4-6 inches (10-15 cm). Keep your knee straight as you lift your leg. 3. Hold this position for __________ seconds. 4. Slowly lower your leg to the starting position. 5. Let your leg relax completely after each repetition. Repeat __________ times. Complete this exercise __________ times a day. This information is not intended to replace advice given to you by your health care provider. Make sure you discuss any questions you have with your health care provider. Document Revised: 08/17/2018 Document Reviewed: 08/17/2018 [employer] Patient Education  2020 [employer] Hand Exercises Hand exercises can be helpful for almost anyone. These exercises can strengthen the hands, improve flexibility and movement, and increase blood flow to the hands. These results can make work and daily tasks easier. Hand exercises can be especially helpful for people who have joint pain from arthritis or have nerve damage from overuse (carpal tunnel syndrome). These exercises can also help people who have injured a hand. Exercises Most of these hand exercises are gentle stretching and motion exercises. It is usually safe to do them often throughout the day. Warming up your hands before exercise may help to reduce stiffness. You can do this with gentle massage or by placing your hands in warm water for 10-15 minutes. It is normal to feel some stretching, pulling,  tightness, or mild discomfort as you begin new exercises. This will gradually improve. Stop an exercise right away if you feel sudden, severe pain or your pain gets worse. Ask your health care provider which exercises are best for you. Knuckle bend or "claw" fist 1. Stand or sit with your arm, hand, and all five fingers pointed straight up. Make sure to keep your wrist straight during the exercise. 2. Gently bend your fingers down toward your palm until the tips of your fingers are touching the top of your palm. Keep your big knuckle straight and just bend the small knuckles in your fingers. 3. Hold this position for   __________ seconds. 4. Straighten (extend) your fingers back to the starting position. Repeat this exercise 5-10 times with each hand. Full finger fist 1. Stand or sit with your arm, hand, and all five fingers pointed straight up. Make sure to keep your wrist straight during the exercise. 2. Gently bend your fingers into your palm until the tips of your fingers are touching the middle of your palm. 3. Hold this position for __________ seconds. 4. Extend your fingers back to the starting position, stretching every joint fully. Repeat this exercise 5-10 times with each hand. Straight fist 1. Stand or sit with your arm, hand, and all five fingers pointed straight up. Make sure to keep your wrist straight during the exercise. 2. Gently bend your fingers at the big knuckle, where your fingers meet your hand, and the middle knuckle. Keep the knuckle at the tips of your fingers straight and try to touch the bottom of your palm. 3. Hold this position for __________ seconds. 4. Extend your fingers back to the starting position, stretching every joint fully. Repeat this exercise 5-10 times with each hand. Tabletop 1. Stand or sit with your arm, hand, and all five fingers pointed straight up. Make sure to keep your wrist straight during the exercise. 2. Gently bend your fingers at the big  knuckle, where your fingers meet your hand, as far down as you can while keeping the small knuckles in your fingers straight. Think of forming a tabletop with your fingers. 3. Hold this position for __________ seconds. 4. Extend your fingers back to the starting position, stretching every joint fully. Repeat this exercise 5-10 times with each hand. Finger spread 1. Place your hand flat on a table with your palm facing down. Make sure your wrist stays straight as you do this exercise. 2. Spread your fingers and thumb apart from each other as far as you can until you feel a gentle stretch. Hold this position for __________ seconds. 3. Bring your fingers and thumb tight together again. Hold this position for __________ seconds. Repeat this exercise 5-10 times with each hand. Making circles 1. Stand or sit with your arm, hand, and all five fingers pointed straight up. Make sure to keep your wrist straight during the exercise. 2. Make a circle by touching the tip of your thumb to the tip of your index finger. 3. Hold for __________ seconds. Then open your hand wide. 4. Repeat this motion with your thumb and each finger on your hand. Repeat this exercise 5-10 times with each hand. Thumb motion 1. Sit with your forearm resting on a table and your wrist straight. Your thumb should be facing up toward the ceiling. Keep your fingers relaxed as you move your thumb. 2. Lift your thumb up as high as you can toward the ceiling. Hold for __________ seconds. 3. Bend your thumb across your palm as far as you can, reaching the tip of your thumb for the small finger (pinkie) side of your palm. Hold for __________ seconds. Repeat this exercise 5-10 times with each hand. Grip strengthening  1. Hold a stress ball or other soft ball in the middle of your hand. 2. Slowly increase the pressure, squeezing the ball as much as you can without causing pain. Think of bringing the tips of your fingers into the middle of  your palm. All of your finger joints should bend when doing this exercise. 3. Hold your squeeze for __________ seconds, then relax. Repeat this exercise 5-10 times with each   hand. Contact a health care provider if:  Your hand pain or discomfort gets much worse when you do an exercise.  Your hand pain or discomfort does not improve within 2 hours after you exercise. If you have any of these problems, stop doing these exercises right away. Do not do them again unless your health care provider says that you can. Get help right away if:  You develop sudden, severe hand pain or swelling. If this happens, stop doing these exercises right away. Do not do them again unless your health care provider says that you can. This information is not intended to replace advice given to you by your health care provider. Make sure you discuss any questions you have with your health care provider. Document Revised: 02/17/2019 Document Reviewed: 10/28/2018 [employer] Patient Education  2020 [employer]  

## 2020-05-04 NOTE — Telephone Encounter (Signed)
Patient saw her doctor today and states she was showed on her mychart that she had a mixoma mass. She states she never had a mixoma mass but had a lipoma mass. She would like this corrected in mychart.

## 2020-05-08 DIAGNOSIS — M50122 Cervical disc disorder at C5-C6 level with radiculopathy: Secondary | ICD-10-CM | POA: Diagnosis not present

## 2020-05-08 DIAGNOSIS — M9902 Segmental and somatic dysfunction of thoracic region: Secondary | ICD-10-CM | POA: Diagnosis not present

## 2020-05-08 DIAGNOSIS — M5384 Other specified dorsopathies, thoracic region: Secondary | ICD-10-CM | POA: Diagnosis not present

## 2020-05-08 DIAGNOSIS — M9901 Segmental and somatic dysfunction of cervical region: Secondary | ICD-10-CM | POA: Diagnosis not present

## 2020-05-09 ENCOUNTER — Encounter: Payer: Self-pay | Admitting: Family Medicine

## 2020-05-09 DIAGNOSIS — M50122 Cervical disc disorder at C5-C6 level with radiculopathy: Secondary | ICD-10-CM | POA: Diagnosis not present

## 2020-05-09 DIAGNOSIS — M9901 Segmental and somatic dysfunction of cervical region: Secondary | ICD-10-CM | POA: Diagnosis not present

## 2020-05-09 DIAGNOSIS — M5384 Other specified dorsopathies, thoracic region: Secondary | ICD-10-CM | POA: Diagnosis not present

## 2020-05-09 DIAGNOSIS — M9902 Segmental and somatic dysfunction of thoracic region: Secondary | ICD-10-CM | POA: Diagnosis not present

## 2020-05-10 DIAGNOSIS — M50122 Cervical disc disorder at C5-C6 level with radiculopathy: Secondary | ICD-10-CM | POA: Diagnosis not present

## 2020-05-10 DIAGNOSIS — M9902 Segmental and somatic dysfunction of thoracic region: Secondary | ICD-10-CM | POA: Diagnosis not present

## 2020-05-10 DIAGNOSIS — M9901 Segmental and somatic dysfunction of cervical region: Secondary | ICD-10-CM | POA: Diagnosis not present

## 2020-05-10 DIAGNOSIS — M5384 Other specified dorsopathies, thoracic region: Secondary | ICD-10-CM | POA: Diagnosis not present

## 2020-05-15 DIAGNOSIS — M9902 Segmental and somatic dysfunction of thoracic region: Secondary | ICD-10-CM | POA: Diagnosis not present

## 2020-05-15 DIAGNOSIS — M9901 Segmental and somatic dysfunction of cervical region: Secondary | ICD-10-CM | POA: Diagnosis not present

## 2020-05-15 DIAGNOSIS — M5384 Other specified dorsopathies, thoracic region: Secondary | ICD-10-CM | POA: Diagnosis not present

## 2020-05-15 DIAGNOSIS — M50122 Cervical disc disorder at C5-C6 level with radiculopathy: Secondary | ICD-10-CM | POA: Diagnosis not present

## 2020-05-17 DIAGNOSIS — M9901 Segmental and somatic dysfunction of cervical region: Secondary | ICD-10-CM | POA: Diagnosis not present

## 2020-05-17 DIAGNOSIS — M50122 Cervical disc disorder at C5-C6 level with radiculopathy: Secondary | ICD-10-CM | POA: Diagnosis not present

## 2020-05-17 DIAGNOSIS — M5384 Other specified dorsopathies, thoracic region: Secondary | ICD-10-CM | POA: Diagnosis not present

## 2020-05-17 DIAGNOSIS — M9902 Segmental and somatic dysfunction of thoracic region: Secondary | ICD-10-CM | POA: Diagnosis not present

## 2020-05-22 DIAGNOSIS — M5384 Other specified dorsopathies, thoracic region: Secondary | ICD-10-CM | POA: Diagnosis not present

## 2020-05-22 DIAGNOSIS — M9902 Segmental and somatic dysfunction of thoracic region: Secondary | ICD-10-CM | POA: Diagnosis not present

## 2020-05-22 DIAGNOSIS — M50122 Cervical disc disorder at C5-C6 level with radiculopathy: Secondary | ICD-10-CM | POA: Diagnosis not present

## 2020-05-22 DIAGNOSIS — M9901 Segmental and somatic dysfunction of cervical region: Secondary | ICD-10-CM | POA: Diagnosis not present

## 2020-05-24 DIAGNOSIS — M50122 Cervical disc disorder at C5-C6 level with radiculopathy: Secondary | ICD-10-CM | POA: Diagnosis not present

## 2020-05-24 DIAGNOSIS — M9902 Segmental and somatic dysfunction of thoracic region: Secondary | ICD-10-CM | POA: Diagnosis not present

## 2020-05-24 DIAGNOSIS — M9901 Segmental and somatic dysfunction of cervical region: Secondary | ICD-10-CM | POA: Diagnosis not present

## 2020-05-24 DIAGNOSIS — M5384 Other specified dorsopathies, thoracic region: Secondary | ICD-10-CM | POA: Diagnosis not present
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
## 2020-05-29 ENCOUNTER — Ambulatory Visit: Payer: BC Managed Care – PPO | Admitting: Rheumatology

## 2020-05-30 DIAGNOSIS — M5384 Other specified dorsopathies, thoracic region: Secondary | ICD-10-CM | POA: Diagnosis not present

## 2020-05-30 DIAGNOSIS — M9901 Segmental and somatic dysfunction of cervical region: Secondary | ICD-10-CM | POA: Diagnosis not present

## 2020-05-30 DIAGNOSIS — M9902 Segmental and somatic dysfunction of thoracic region: Secondary | ICD-10-CM | POA: Diagnosis not present

## 2020-05-30 DIAGNOSIS — M50122 Cervical disc disorder at C5-C6 level with radiculopathy: Secondary | ICD-10-CM | POA: Diagnosis not present

## 2020-05-31 ENCOUNTER — Ambulatory Visit (INDEPENDENT_AMBULATORY_CARE_PROVIDER_SITE_OTHER): Payer: BC Managed Care – PPO | Admitting: Rheumatology

## 2020-05-31 ENCOUNTER — Encounter: Payer: Self-pay | Admitting: Rheumatology

## 2020-05-31 ENCOUNTER — Other Ambulatory Visit: Payer: Self-pay

## 2020-05-31 VITALS — BP 102/68 | HR 74 | Resp 14 | Ht 67.0 in | Wt 157.0 lb

## 2020-05-31 DIAGNOSIS — M1712 Unilateral primary osteoarthritis, left knee: Secondary | ICD-10-CM | POA: Diagnosis not present

## 2020-05-31 DIAGNOSIS — M797 Fibromyalgia: Secondary | ICD-10-CM

## 2020-05-31 DIAGNOSIS — Z9109 Other allergy status, other than to drugs and biological substances: Secondary | ICD-10-CM

## 2020-05-31 DIAGNOSIS — R5382 Chronic fatigue, unspecified: Secondary | ICD-10-CM

## 2020-05-31 DIAGNOSIS — Z91018 Allergy to other foods: Secondary | ICD-10-CM

## 2020-05-31 DIAGNOSIS — M79641 Pain in right hand: Secondary | ICD-10-CM | POA: Diagnosis not present

## 2020-05-31 DIAGNOSIS — K2100 Gastro-esophageal reflux disease with esophagitis, without bleeding: Secondary | ICD-10-CM

## 2020-05-31 DIAGNOSIS — R768 Other specified abnormal immunological findings in serum: Secondary | ICD-10-CM

## 2020-05-31 DIAGNOSIS — M6289 Other specified disorders of muscle: Secondary | ICD-10-CM

## 2020-05-31 DIAGNOSIS — I5189 Other ill-defined heart diseases: Secondary | ICD-10-CM

## 2020-05-31 DIAGNOSIS — M79642 Pain in left hand: Secondary | ICD-10-CM

## 2020-05-31 DIAGNOSIS — K581 Irritable bowel syndrome with constipation: Secondary | ICD-10-CM

## 2020-05-31 DIAGNOSIS — F419 Anxiety disorder, unspecified: Secondary | ICD-10-CM

## 2020-06-05 DIAGNOSIS — M50122 Cervical disc disorder at C5-C6 level with radiculopathy: Secondary | ICD-10-CM | POA: Diagnosis not present

## 2020-06-05 DIAGNOSIS — M5384 Other specified dorsopathies, thoracic region: Secondary | ICD-10-CM | POA: Diagnosis not present

## 2020-06-05 DIAGNOSIS — M9901 Segmental and somatic dysfunction of cervical region: Secondary | ICD-10-CM | POA: Diagnosis not present

## 2020-06-05 DIAGNOSIS — M9902 Segmental and somatic dysfunction of thoracic region: Secondary | ICD-10-CM | POA: Diagnosis not present

## 2020-06-07 ENCOUNTER — Encounter: Payer: Self-pay | Admitting: Family Medicine

## 2020-06-08 NOTE — Telephone Encounter (Addendum)
Please schedule pt in closest appt slot to discuss her symptoms and evaluate.  thanks

## 2020-06-08 NOTE — Telephone Encounter (Signed)
Please advise if pt needs to schedule appt for next week. Soonest open appt would be Tuesday per schedule.

## 2020-06-12 DIAGNOSIS — M9902 Segmental and somatic dysfunction of thoracic region: Secondary | ICD-10-CM | POA: Diagnosis not present

## 2020-06-12 DIAGNOSIS — M5384 Other specified dorsopathies, thoracic region: Secondary | ICD-10-CM | POA: Diagnosis not present

## 2020-06-12 DIAGNOSIS — M9901 Segmental and somatic dysfunction of cervical region: Secondary | ICD-10-CM | POA: Diagnosis not present

## 2020-06-12 DIAGNOSIS — M50122 Cervical disc disorder at C5-C6 level with radiculopathy: Secondary | ICD-10-CM | POA: Diagnosis not present

## 2020-06-15 ENCOUNTER — Encounter: Payer: BC Managed Care – PPO | Admitting: Family Medicine

## 2020-06-18 ENCOUNTER — Ambulatory Visit: Payer: BC Managed Care – PPO | Admitting: Rheumatology

## 2020-06-27 ENCOUNTER — Ambulatory Visit (INDEPENDENT_AMBULATORY_CARE_PROVIDER_SITE_OTHER): Payer: BC Managed Care – PPO | Admitting: Family Medicine

## 2020-06-27 ENCOUNTER — Other Ambulatory Visit: Payer: Self-pay

## 2020-06-27 ENCOUNTER — Telehealth: Payer: Self-pay | Admitting: Family Medicine

## 2020-06-27 ENCOUNTER — Encounter: Payer: Self-pay | Admitting: Family Medicine

## 2020-06-27 VITALS — BP 102/71 | HR 75 | Temp 97.9°F | Resp 16 | Ht 67.75 in | Wt 155.4 lb

## 2020-06-27 DIAGNOSIS — Z0000 Encounter for general adult medical examination without abnormal findings: Secondary | ICD-10-CM

## 2020-06-27 DIAGNOSIS — R202 Paresthesia of skin: Secondary | ICD-10-CM

## 2020-06-27 DIAGNOSIS — E781 Pure hyperglyceridemia: Secondary | ICD-10-CM | POA: Diagnosis not present

## 2020-06-27 DIAGNOSIS — M2550 Pain in unspecified joint: Secondary | ICD-10-CM

## 2020-06-27 LAB — VITAMIN D 25 HYDROXY (VIT D DEFICIENCY, FRACTURES): VITD: 29.72 ng/mL — ABNORMAL LOW (ref 30.00–100.00)

## 2020-06-27 LAB — BASIC METABOLIC PANEL
BUN: 15 mg/dL (ref 6–23)
CO2: 29 mEq/L (ref 19–32)
Calcium: 9.3 mg/dL (ref 8.4–10.5)
Chloride: 102 mEq/L (ref 96–112)
Creatinine, Ser: 0.75 mg/dL (ref 0.40–1.20)
GFR: 82.1 mL/min (ref >60.00)
Glucose, Bld: 113 mg/dL — ABNORMAL HIGH (ref 70–99)
Potassium: 3.9 mEq/L (ref 3.5–5.1)
Sodium: 138 mEq/L (ref 135–145)

## 2020-06-27 LAB — LIPID PANEL
Cholesterol: 163 mg/dL (ref 0–200)
HDL: 42.4 mg/dL (ref >39.00)
LDL Cholesterol: 95 mg/dL (ref 0–99)
NonHDL: 120.92
Total CHOL/HDL Ratio: 4
Triglycerides: 130 mg/dL (ref 0.0–149.0)
VLDL: 26 mg/dL (ref 0.0–40.0)

## 2020-06-27 LAB — VITAMIN B12: Vitamin B-12: 407 pg/mL (ref 211–911)

## 2020-06-27 LAB — MAGNESIUM: Magnesium: 1.9 mg/dL (ref 1.5–2.5)

## 2020-06-27 NOTE — Patient Instructions (Addendum)
Health Maintenance, Female Adopting a healthy lifestyle and getting preventive care are important in promoting health and wellness. Ask your health care provider about:  The right schedule for you to have regular tests and exams.  Things you can do on your own to prevent diseases and keep yourself healthy. What should I know about diet, weight, and exercise? Eat a healthy diet   Eat a diet that includes plenty of vegetables, fruits, low-fat dairy products, and lean protein.  Do not eat a lot of foods that are high in solid fats, added sugars, or sodium. Maintain a healthy weight Body mass index (BMI) is used to identify weight problems. It estimates body fat based on height and weight. Your health care provider can help determine your BMI and help you achieve or maintain a healthy weight. Get regular exercise Get regular exercise. This is one of the most important things you can do for your health. Most adults should:  Exercise for at least 150 minutes each week. The exercise should increase your heart rate and make you sweat (moderate-intensity exercise).  Do strengthening exercises at least twice a week. This is in addition to the moderate-intensity exercise.  Spend less time sitting. Even light physical activity can be beneficial. Watch cholesterol and blood lipids Have your blood tested for lipids and cholesterol at 49 years of age, then have this test every 5 years. Have your cholesterol levels checked more often if:  Your lipid or cholesterol levels are high.  You are older than 49 years of age.  You are at high risk for heart disease. What should I know about cancer screening? Depending on your health history and family history, you may need to have cancer screening at various ages. This may include screening for:  Breast cancer.  Cervical cancer.  Colorectal cancer.  Skin cancer.  Lung cancer. What should I know about heart disease, diabetes, and high blood  pressure? Blood pressure and heart disease  High blood pressure causes heart disease and increases the risk of stroke. This is more likely to develop in people who have high blood pressure readings, are of African descent, or are overweight.  Have your blood pressure checked: ? Every 3-5 years if you are 18-39 years of age. ? Every year if you are 40 years old or older. Diabetes Have regular diabetes screenings. This checks your fasting blood sugar level. Have the screening done:  Once every three years after age 40 if you are at a normal weight and have a low risk for diabetes.  More often and at a younger age if you are overweight or have a high risk for diabetes. What should I know about preventing infection? Hepatitis B If you have a higher risk for hepatitis B, you should be screened for this virus. Talk with your health care provider to find out if you are at risk for hepatitis B infection. Hepatitis C Testing is recommended for:  Everyone born from 1945 through 1965.  Anyone with known risk factors for hepatitis C. Sexually transmitted infections (STIs)  Get screened for STIs, including gonorrhea and chlamydia, if: ? You are sexually active and are younger than 49 years of age. ? You are older than 49 years of age and your health care provider tells you that you are at risk for this type of infection. ? Your sexual activity has changed since you were last screened, and you are at increased risk for chlamydia or gonorrhea. Ask your health care provider if   you are at risk.  Ask your health care provider about whether you are at high risk for HIV. Your health care provider may recommend a prescription medicine to help prevent HIV infection. If you choose to take medicine to prevent HIV, you should first get tested for HIV. You should then be tested every 3 months for as long as you are taking the medicine. Pregnancy  If you are about to stop having your period (premenopausal) and  you may become pregnant, seek counseling before you get pregnant.  Take 400 to 800 micrograms (mcg) of folic acid every day if you become pregnant.  Ask for birth control (contraception) if you want to prevent pregnancy. Osteoporosis and menopause Osteoporosis is a disease in which the bones lose minerals and strength with aging. This can result in bone fractures. If you are 65 years old or older, or if you are at risk for osteoporosis and fractures, ask your health care provider if you should:  Be screened for bone loss.  Take a calcium or vitamin D supplement to lower your risk of fractures.  Be given hormone replacement therapy (HRT) to treat symptoms of menopause. Follow these instructions at home: Lifestyle  Do not use any products that contain nicotine or tobacco, such as cigarettes, e-cigarettes, and chewing tobacco. If you need help quitting, ask your health care provider.  Do not use street drugs.  Do not share needles.  Ask your health care provider for help if you need support or information about quitting drugs. Alcohol use  Do not drink alcohol if: ? Your health care provider tells you not to drink. ? You are pregnant, may be pregnant, or are planning to become pregnant.  If you drink alcohol: ? Limit how much you use to 0-1 drink a day. ? Limit intake if you are breastfeeding.  Be aware of how much alcohol is in your drink. In the [place] one drink equals one 12 oz bottle of beer (355 mL), one 5 oz glass of wine (148 mL), or one 1 oz glass of hard liquor (44 mL). General instructions  Schedule regular health, dental, and eye exams.  Stay current with your vaccines.  Tell your health care provider if: ? You often feel depressed. ? You have ever been abused or do not feel safe at home. Summary  Adopting a healthy lifestyle and getting preventive care are important in promoting health and wellness.  Follow your health care provider's instructions about healthy  diet, exercising, and getting tested or screened for diseases.  Follow your health care provider's instructions on monitoring your cholesterol and blood pressure. This information is not intended to replace advice given to you by your health care provider. Make sure you discuss any questions you have with your health care provider. Document Revised: 10/20/2018 Document Reviewed: 10/20/2018 Elsevier Patient Education  2020 [employer]  
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
## 2020-06-27 NOTE — Telephone Encounter (Signed)
Please inform patient Kidney function and electrolytes are normal. Cholesterol panel looks great with a total cholesterol 163, LDL 95, HDL 42 and triglycerides 130. Vitamin D levels are 29.72.  Normal is above 30 and ideally for bone health levels of 40-50 or desired.  I would encourage her to increase her vitamin D tablets to 2000 units daily. Her B12 levels were in the low normal range at 407.  People who have B12 levels at 400 or lower can have some neurological symptoms such as paresthesias, aches, pains headaches etc.  I would encourage her to start an over-the-counter B12 sublingual solution daily.  This is a solution that is dropped underneath the tongue and held for about 30 seconds.  Dosage anything above 1000 mcg is sufficient.

## 2020-06-28 NOTE — Telephone Encounter (Signed)
Left detailed VM per DPR about pt lab results

## 2020-07-02 DIAGNOSIS — M50122 Cervical disc disorder at C5-C6 level with radiculopathy: Secondary | ICD-10-CM | POA: Diagnosis not present

## 2020-07-02 DIAGNOSIS — M9901 Segmental and somatic dysfunction of cervical region: Secondary | ICD-10-CM | POA: Diagnosis not present

## 2020-07-02 DIAGNOSIS — M5384 Other specified dorsopathies, thoracic region: Secondary | ICD-10-CM | POA: Diagnosis not present

## 2020-07-02 DIAGNOSIS — M9902 Segmental and somatic dysfunction of thoracic region: Secondary | ICD-10-CM | POA: Diagnosis not present

## 2020-07-19 DIAGNOSIS — M5384 Other specified dorsopathies, thoracic region: Secondary | ICD-10-CM | POA: Diagnosis not present

## 2020-07-19 DIAGNOSIS — M9902 Segmental and somatic dysfunction of thoracic region: Secondary | ICD-10-CM | POA: Diagnosis not present

## 2020-07-19 DIAGNOSIS — M9901 Segmental and somatic dysfunction of cervical region: Secondary | ICD-10-CM | POA: Diagnosis not present

## 2020-07-19 DIAGNOSIS — M50122 Cervical disc disorder at C5-C6 level with radiculopathy: Secondary | ICD-10-CM | POA: Diagnosis not present

## 2020-07-20 ENCOUNTER — Telehealth: Payer: Self-pay | Admitting: Cardiology

## 2020-07-20 NOTE — Telephone Encounter (Signed)
Returned call to patient she stated this past week her B/P has been low.87/67.Stated she takes Metoprolol 25 mg 1/4 tablet twice a day.Stated she woke up last night with palpitations.Stated she feels ok at present but she is concerned her B/P has been low.Appointment scheduled with Coletta Memos NP Monday 9/13 at 8:15 am.Advised to bring B/P readings and all medication.

## 2020-07-20 NOTE — Telephone Encounter (Signed)
Pt c/o medication issue:  1. Name of Medication: metoprolol tartrate (LOPRESSOR) 25 MG tablet  2. How are you currently taking this medication (dosage and times per day)? Didn't take yesterday. Took a quarter of a tablet in the middle of the night last night.  3. Are you having a reaction (difficulty breathing--STAT)? Yes   4. What is your medication issue? Emily Joseph is calling stating taking this medication as prescribed is causing her to have hypotension. She states on Monday 07/16/20 her BP got down to 87/67. She states it is ranging at about 90/over something normally. She has noticed it would bottom out after taking this medication, so she did not take it at all yesterday. Emily Joseph states she felt great all day yesterday without taking the medication, but woke up in the middle of the night with her heart fluttering. Emily Joseph took a quarter of a tablet at that time due to this, and feels fine now with no symptoms. Emily Joseph is taking her dog to the vet around 72 today, so if the callback is not before then it would be better to call after lunch. Please advise.
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
## 2020-07-23 ENCOUNTER — Encounter: Payer: Self-pay | Admitting: General Practice

## 2020-07-23 ENCOUNTER — Ambulatory Visit (INDEPENDENT_AMBULATORY_CARE_PROVIDER_SITE_OTHER): Payer: BC Managed Care – PPO | Admitting: General Practice

## 2020-07-23 ENCOUNTER — Other Ambulatory Visit: Payer: Self-pay

## 2020-07-23 VITALS — BP 114/72 | HR 78 | Ht 67.0 in | Wt 155.8 lb

## 2020-07-23 DIAGNOSIS — Z79899 Other long term (current) drug therapy: Secondary | ICD-10-CM | POA: Diagnosis not present

## 2020-07-23 DIAGNOSIS — R002 Palpitations: Secondary | ICD-10-CM

## 2020-07-23 DIAGNOSIS — I5189 Other ill-defined heart diseases: Secondary | ICD-10-CM

## 2020-07-23 DIAGNOSIS — I952 Hypotension due to drugs: Secondary | ICD-10-CM | POA: Diagnosis not present

## 2020-07-23 NOTE — Patient Instructions (Addendum)
Medication Instructions:  TAKE METOPROLOL AS NEEDED *If you need a refill on your cardiac medications before your next appointment, please call your pharmacy*  Lab Work: NONE  Testing/Procedures: NONE  Follow-Up: Your next appointment:  KEEP SCHEDULED APPOINTMENT In Person with Kirk Ruths, MD   At [facility], you and your health needs are our priority.  As part of our continuing mission to provide you with exceptional heart care, we have created designated Provider Care Teams.  These Care Teams include your primary Cardiologist (physician) and Advanced Practice Providers (APPs -  Physician Assistants and Nurse Practitioners) who all work together to provide you with the care you need, when you need it.

## 2020-08-03 ENCOUNTER — Encounter: Payer: Self-pay | Admitting: Family Medicine

## 2020-08-30 DIAGNOSIS — Z1231 Encounter for screening mammogram for malignant neoplasm of breast: Secondary | ICD-10-CM | POA: Diagnosis not present

## 2020-09-03 LAB — HM MAMMOGRAPHY
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
## 2020-09-07 DIAGNOSIS — M1990 Unspecified osteoarthritis, unspecified site: Secondary | ICD-10-CM | POA: Insufficient documentation

## 2020-09-07 DIAGNOSIS — I519 Heart disease, unspecified: Secondary | ICD-10-CM | POA: Insufficient documentation

## 2020-09-07 DIAGNOSIS — N393 Stress incontinence (female) (male): Secondary | ICD-10-CM | POA: Insufficient documentation

## 2020-09-07 DIAGNOSIS — Z9889 Other specified postprocedural states: Secondary | ICD-10-CM | POA: Insufficient documentation

## 2020-09-07 DIAGNOSIS — Z01419 Encounter for gynecological examination (general) (routine) without abnormal findings: Secondary | ICD-10-CM | POA: Diagnosis not present

## 2020-09-07 DIAGNOSIS — Z6824 Body mass index (BMI) 24.0-24.9, adult: Secondary | ICD-10-CM | POA: Diagnosis not present

## 2020-09-07 DIAGNOSIS — Z90710 Acquired absence of both cervix and uterus: Secondary | ICD-10-CM

## 2020-09-07 HISTORY — DX: Acquired absence of both cervix and uterus: Z90.710

## 2020-09-07 HISTORY — DX: Other specified postprocedural states: Z98.890

## 2020-09-10 ENCOUNTER — Encounter: Payer: Self-pay | Admitting: Cardiology

## 2020-09-10 ENCOUNTER — Ambulatory Visit (INDEPENDENT_AMBULATORY_CARE_PROVIDER_SITE_OTHER): Payer: BC Managed Care – PPO | Admitting: Cardiology

## 2020-09-10 ENCOUNTER — Other Ambulatory Visit: Payer: Self-pay

## 2020-09-10 VITALS — BP 116/72 | HR 89 | Ht 67.0 in | Wt 157.8 lb

## 2020-09-10 DIAGNOSIS — R002 Palpitations: Secondary | ICD-10-CM | POA: Diagnosis not present

## 2020-09-10 DIAGNOSIS — I5189 Other ill-defined heart diseases: Secondary | ICD-10-CM | POA: Diagnosis not present

## 2020-09-10 DIAGNOSIS — I4892 Unspecified atrial flutter: Secondary | ICD-10-CM

## 2020-09-10 NOTE — Patient Instructions (Signed)

## 2020-09-11 ENCOUNTER — Telehealth (INDEPENDENT_AMBULATORY_CARE_PROVIDER_SITE_OTHER): Payer: BC Managed Care – PPO | Admitting: Family Medicine

## 2020-09-11 ENCOUNTER — Encounter: Payer: Self-pay | Admitting: Family Medicine

## 2020-09-11 DIAGNOSIS — R319 Hematuria, unspecified: Secondary | ICD-10-CM

## 2020-09-11 DIAGNOSIS — R300 Dysuria: Secondary | ICD-10-CM

## 2020-09-11 LAB — POCT URINALYSIS DIPSTICK
Bilirubin, UA: NEGATIVE
Glucose, UA: NEGATIVE
Ketones, UA: NEGATIVE
Leukocytes, UA: NEGATIVE
Nitrite, UA: NEGATIVE
Protein, UA: NEGATIVE
Spec Grav, UA: 1.01 (ref 1.010–1.025)
Urobilinogen, UA: 0.2 E.U./dL
pH, UA: 6.5 (ref 5.0–8.0)

## 2020-09-11 MED ORDER — CEPHALEXIN 500 MG PO CAPS: 500.0000 mg | ORAL_CAPSULE | Freq: Three times a day (TID) | ORAL | 0 refills | Status: DC

## 2020-09-11 NOTE — Progress Notes (Signed)
VIRTUAL VISIT VIA VIDEO  I connected with Emily Joseph on 09/11/20 at  4:00 PM EDT by elemedicine application and verified that I am speaking with the correct person using two identifiers. Location patient: Home Location provider: [facility], Office Persons participating in the virtual visit: Patient, Dr. Raoul Pitch and Samul Dada, CMA  I discussed the limitations of evaluation and management by telemedicine and the availability of in person appointments. The patient expressed understanding and agreed to proceed.   SUBJECTIVE Chief Complaint  Patient presents with  . Dysuria    This is a recurrent problem that occurred over the weekend. Associated symptoms include urgency, constipation, freq urination, pelvic pain, denies any fever. She has tried antibiotics for the symptoms with significant relief. Her past medical history is significant for recurrent UTIs.     HPI: Emily Joseph is a 49 y.o. present for dysuria 4 days duration.  pansensitive e.coli 03/2018. She reports she started Keflex BID she has from her urologist and it has started to improve her symptoms. She has had Pelvic floor Pt.  Cipro, sulfa macrobid allergy. She has responded well to Kelfex in the past.  She denies fever, chills, nausea or decreased urine output. She is tolerating PO.  ROS: See pertinent positives and negatives per HPI.  Patient Active Problem List   Diagnosis Date Noted  . H/O laminectomy 09/07/2020  . History of laparoscopic-assisted vaginal hysterectomy 09/07/2020  . Arthritis 09/07/2020  . Female stress incontinence 09/07/2020  . Heart disease 09/07/2020  . Primary osteoarthritis of left knee 05/31/2020  . Pelvic floor dysfunction 12/28/2018  . Chronic RUQ pain - exacerbation 12/28/2018  . Atrial flutter with rapid ventricular response ([place]) 10/27/2016  . UTI (urinary tract infection) 07/14/2016  . 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 Chronic pain 06/08/2016  .  Chronic fatigue 06/08/2016  . GERD (gastroesophageal reflux disease) with esophagitis 06/08/2016  . Palpitations 06/08/2016  . Anxiety 06/08/2016  . Multiple environmental allergies 03/04/2016  . Multiple food allergies 03/04/2016  . Irritable bowel syndrome with constipation 01/27/2013  . Fibromyalgia 01/07/2007    Social History   Tobacco Use  . Smoking status: Never Smoker  . Smokeless tobacco: Never Used  Substance Use Topics  . Alcohol use: Yes    Alcohol/week: 0.0 standard drinks    Comment: 3 glasses wine per week or less     Current Outpatient Medications:  .  cholecalciferol (VITAMIN D) 25 MCG (1000 UT) tablet, Take 1,000 Units by mouth daily. , Disp: , Rfl:  .  Cranberry 400 MG CAPS, Take by mouth., Disp: , Rfl:  .  EPINEPHrine (EPIPEN 2-PAK) 0.3 mg/0.3 mL IJ SOAJ injection, Inject 0.3 mg into the muscle as needed for anaphylaxis. , Disp: , Rfl:  .  fluticasone (FLONASE) 50 MCG/ACT nasal spray, Place 2 sprays into both nostrils daily., Disp: 16 g, Rfl: 6 .  Methylcobalamin (METHYL B-12 PO), Take by mouth as needed., Disp: , Rfl:  .  Omega-3 Fatty Acids (FISH OIL) 1200 MG CAPS, Take by mouth., Disp: , Rfl:  .  Pediatric Multivit-Minerals-C (CHILDRENS VITAMINS PO), Take 1 tablet by mouth daily. , Disp: , Rfl:  .  Polyethyl Glycol-Propyl Glycol (SYSTANE OP), Place 1 drop into both eyes at bedtime., Disp: , Rfl:  .  cephALEXin (KEFLEX) 500 MG capsule, Take 1 capsule (500 mg total) by mouth 3 (three) times daily., Disp: 21 capsule, Rfl: 0 .  metoprolol tartrate (LOPRESSOR) 25 MG tablet,  TAKE 1/2 TABLET(12.5 MG) BY MOUTH TWICE DAILY (Patient not taking: Reported on 09/11/2020), Disp: 90 tablet, Rfl: 3  Allergies  Allergen Reactions  . Avocado Shortness Of Breath and Nausea And Vomiting  . Macadamia Nut Oil Hives and Swelling    LIPS SWELL   . Mango Flavor Swelling    SWELLING OF THE LIPS  . Other Other (See Comments)    Patient is allergic to garden peas per allergy  testing. Patient states squash causes GI problems and she passed out  . Ciprofloxacin Other (See Comments)    SEVERE JOINT PAIN  . Demerol [Meperidine] Other (See Comments)    HALLUCINATIONS   . Iodinated Diagnostic Agents Hives and Other (See Comments)    Patient had IVP @ age 50 and broke out in Hives (was once pre-treated with several meds)  . Macrobid [Nitrofurantoin] Other (See Comments)    CHEST PAIN  . Betadine [Povidone Iodine]     hives  . Latex Swelling    Please use paper tape or nitrile gloves  . Progesterone   . Dulcolax [Bisacodyl] Palpitations    Felt light headed and dizzy  . Sulfamethoxazole Nausea Only and Other (See Comments)    Childhood reaction - upset stomach  . Tape Rash and Other (See Comments)    Family allergy; this is to be avoided; tolerates paper tape    OBJECTIVE: LMP 05/20/2014 (Approximate)  Gen: No acute distress. Nontoxic in appearance.  HENT: AT. [place].  MMM.  Eyes:Pupils Equal Round Reactive to light, Extraocular movements intact,  Conjunctiva without redness, discharge or icterus. Neuro:  Alert. Oriented x3  Psych: Normal affect and demeanor. Normal speech. Normal thought content and judgment.  ASSESSMENT AND PLAN: Emily Joseph is a 49 y.o. female present for  Dysuria/heamturia Rest, hydrate - POCT Urinalysis Dipstick - Urinalysis w microscopic + reflex cultur - kelfex TID x7 days Will send culture to be complete F/u prn   Howard Pouch, DO 09/11/2020   Return if symptoms worsen or fail to improve.  Orders Placed This Encounter  Procedures  . Urinalysis w microscopic + reflex cultur  . POCT Urinalysis Dipstick   Meds ordered this encounter  Medications  . cephALEXin (KEFLEX) 500 MG capsule    Sig: Take 1 capsule (500 mg total) by mouth 3 (three) times daily.    Dispense:  21 capsule    Refill:  0   Referral Orders  No referral(s) requested today

## 2020-09-12 LAB — URINALYSIS W MICROSCOPIC + REFLEX CULTURE
Bacteria, UA: NONE SEEN /HPF
Bilirubin Urine: NEGATIVE
Glucose, UA: NEGATIVE
Hyaline Cast: NONE SEEN /LPF
Ketones, ur: NEGATIVE
Leukocyte Esterase: NEGATIVE
Nitrites, Initial: NEGATIVE
Protein, ur: NEGATIVE
Specific Gravity, Urine: 1.004 (ref 1.001–1.03)
Squamous Epithelial / HPF: NONE SEEN /HPF (ref <=5)
WBC, UA: NONE SEEN /HPF (ref 0–5)
pH: 7.5 (ref 5.0–8.0)

## 2020-09-12 LAB — NO CULTURE INDICATED

## 2020-09-24 ENCOUNTER — Ambulatory Visit (INDEPENDENT_AMBULATORY_CARE_PROVIDER_SITE_OTHER): Payer: BC Managed Care – PPO | Admitting: Family Medicine

## 2020-09-24 ENCOUNTER — Encounter: Payer: Self-pay | Admitting: Family Medicine

## 2020-09-24 ENCOUNTER — Other Ambulatory Visit: Payer: Self-pay

## 2020-09-24 VITALS — BP 109/72 | HR 69 | Temp 98.1°F | Ht 67.0 in | Wt 158.0 lb

## 2020-09-24 DIAGNOSIS — S90859A Superficial foreign body, unspecified foot, initial encounter: Secondary | ICD-10-CM | POA: Diagnosis not present
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
## 2020-09-24 NOTE — Patient Instructions (Addendum)
Looks like you had a small chunk of skin removed by a splinter.  I do not think you need to soak it, but you could just for good measure.

## 2020-10-01 ENCOUNTER — Other Ambulatory Visit: Payer: Self-pay

## 2020-10-01 ENCOUNTER — Ambulatory Visit (HOSPITAL_COMMUNITY): Payer: BC Managed Care – PPO | Attending: Cardiology

## 2020-10-01 DIAGNOSIS — I5189 Other ill-defined heart diseases: Secondary | ICD-10-CM

## 2020-10-01 LAB — ECHOCARDIOGRAM COMPLETE
Area-P 1/2: 3.2 cm2
S' Lateral: 3 cm

## 2020-10-08 DIAGNOSIS — L500 Allergic urticaria: Secondary | ICD-10-CM | POA: Diagnosis not present

## 2020-10-08 DIAGNOSIS — J301 Allergic rhinitis due to pollen: Secondary | ICD-10-CM | POA: Diagnosis not present

## 2020-10-08 DIAGNOSIS — J3081 Allergic rhinitis due to animal (cat) (dog) hair and dander: Secondary | ICD-10-CM | POA: Diagnosis not present

## 2020-10-08 DIAGNOSIS — T781XXD Other adverse food reactions, not elsewhere classified, subsequent encounter: Secondary | ICD-10-CM | POA: Diagnosis not present

## 2020-11-18 ENCOUNTER — Encounter: Payer: Self-pay | Admitting: Family Medicine

## 2020-11-19 ENCOUNTER — Encounter: Payer: Self-pay | Admitting: Family Medicine

## 2020-11-19 NOTE — Telephone Encounter (Signed)
Please schedule pt in acute slot to discuss.  thx

## 2020-11-20 ENCOUNTER — Other Ambulatory Visit: Payer: BC Managed Care – PPO

## 2020-11-20 DIAGNOSIS — Z20822 Contact with and (suspected) exposure to covid-19: Secondary | ICD-10-CM

## 2020-11-20 NOTE — Telephone Encounter (Signed)
Pt needs an appt. She has not been seen for this illness by this provider. If asking for medical advise on her illness, then we need to get her an appt.

## 2020-11-20 NOTE — Telephone Encounter (Signed)
Spoke with pt who stated she slept on a mattress on the floor for the last 4 nights  but feel some discomfort when breathing. Pain is located between shoulder blades and below rib cage.  Pt O2 stats is 99%. Pt states that she is not taking the metoprolol today due to noticing breathing suppressing but monitoring, no palpitations but pulse is 81-150.  Please advise.

## 2020-11-20 NOTE — Telephone Encounter (Signed)
Patient called to say she is not feeling good today. A low grade fever today. She is having some rib pain today. Unsure if its because the way she may be sleeping.  Would like to dscuss symptoms.etc with Dr. Lucita Lora assistant. Please call patient at [phone].  Thank you

## 2020-11-21 ENCOUNTER — Telehealth (INDEPENDENT_AMBULATORY_CARE_PROVIDER_SITE_OTHER): Payer: BC Managed Care – PPO | Admitting: Family Medicine

## 2020-11-21 ENCOUNTER — Encounter: Payer: Self-pay | Admitting: Family Medicine

## 2020-11-21 VITALS — HR 115

## 2020-11-21 DIAGNOSIS — J029 Acute pharyngitis, unspecified: Secondary | ICD-10-CM | POA: Diagnosis not present

## 2020-11-21 DIAGNOSIS — Z20822 Contact with and (suspected) exposure to covid-19: Secondary | ICD-10-CM

## 2020-11-21 NOTE — Patient Instructions (Signed)
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
## 2020-11-22 ENCOUNTER — Encounter: Payer: Self-pay | Admitting: Family Medicine

## 2020-11-22 LAB — SARS-COV-2, NAA 2 DAY TAT

## 2020-11-22 LAB — NOVEL CORONAVIRUS, NAA

## 2020-12-19 ENCOUNTER — Telehealth (INDEPENDENT_AMBULATORY_CARE_PROVIDER_SITE_OTHER): Payer: BC Managed Care – PPO | Admitting: Family Medicine

## 2020-12-19 ENCOUNTER — Encounter: Payer: Self-pay | Admitting: Family Medicine

## 2020-12-19 DIAGNOSIS — J40 Bronchitis, not specified as acute or chronic: Secondary | ICD-10-CM | POA: Diagnosis not present

## 2020-12-19 DIAGNOSIS — U071 COVID-19: Secondary | ICD-10-CM | POA: Diagnosis not present

## 2020-12-19 MED ORDER — AZITHROMYCIN 250 MG PO TABS: ORAL_TABLET | ORAL | 0 refills | Status: DC

## 2020-12-19 NOTE — Patient Instructions (Signed)

## 2020-12-19 NOTE — Progress Notes (Signed)
VIRTUAL VISIT VIA VIDEO  I connected with Emily Joseph on 12/19/20 at  1:30 PM EST by elemedicine application and verified that I am speaking with the correct person using two identifiers. Location patient: Home Location provider: [facility], Office Persons participating in the virtual visit: Patient, Dr. Raoul Pitch and Samul Dada, CMA  I discussed the limitations of evaluation and management by telemedicine and the availability of in person appointments. The patient expressed understanding and agreed to proceed.   SUBJECTIVE Chief Complaint  Patient presents with  . Chills    And night sweats x about 2 weeks  . Constipation    -slower than normal Joseph  . Fatigue    Pt had covid in 11/2020, has not been able to get her energy levels back up.    HPI: Emily Joseph is a 50 y.o. female present for persistant symptoms s/p covid infection last month. She has been waking in night sweats and endorses chills.  Been waking in sweat last few weeks. She has had a mild cough. Mild hoarsenesss. No shortness of breath. She has had constipation, had has finally got her bowel back to normal.  Prior note: Sore  throat,headache, fever, 101F x1 Sunday,  body aches and cough onset 11/15/2020. Breathing is better now-dry. Not having any rib pain or back pain now (felt it was from mattress on floor from self isolation). She is isolating. She had a racing heart and took metoprolol, this is better now.  covid test 11/16/2020 (day 2 after exposure). Exposed 11/14/2020 to coivd while in an 8hr car drive.  Hydrating, pepcid. Oxygen 99% ROS: See pertinent positives and negatives per HPI.  Patient Active Problem List   Diagnosis Date Noted  . H/O laminectomy 09/07/2020  . History of laparoscopic-assisted vaginal hysterectomy 09/07/2020  . Arthritis 09/07/2020  . Female stress incontinence 09/07/2020  . Heart disease 09/07/2020  . Primary osteoarthritis of left knee 05/31/2020  . Pelvic floor dysfunction  12/28/2018  . Chronic RUQ pain - exacerbation 12/28/2018  . Atrial flutter with rapid ventricular response ([place]) 10/27/2016  . UTI (urinary tract infection) 07/14/2016  . 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 Chronic pain 06/08/2016  . Chronic fatigue 06/08/2016  . GERD (gastroesophageal reflux disease) with esophagitis 06/08/2016  . Palpitations 06/08/2016  . Anxiety 06/08/2016  . Multiple environmental allergies 03/04/2016  . Multiple food allergies 03/04/2016  . Irritable bowel syndrome with constipation 01/27/2013  . Fibromyalgia 01/07/2007    Social History   Tobacco Use  . Smoking status: Never Smoker  . Smokeless tobacco: Never Used  Substance Use Topics  . Alcohol use: Yes    Alcohol/week: 0.0 standard drinks    Comment: 3 glasses wine per week or less     Current Outpatient Medications:  .  albuterol (VENTOLIN HFA) 108 (90 Base) MCG/ACT inhaler, Inhale into the lungs., Disp: , Rfl:  .  Ascorbic Acid (VITAMIN C) 1000 MG tablet, Take 1,000 mg by mouth daily., Disp: , Rfl:  .  Azelastine HCl 137 MCG/SPRAY SOLN, Place into both nostrils., Disp: , Rfl:  .  azithromycin (ZITHROMAX) 250 MG tablet, 500 mg PO day 1, then 250 PO mg QD, Disp: 6 tablet, Rfl: 0 .  cephALEXin (KEFLEX) 500 MG capsule, Take 1 capsule (500 mg total) by mouth 3 (three) times daily., Disp: 21 capsule, Rfl: 0 .  cholecalciferol (VITAMIN D) 25 MCG (1000 UT) tablet, Take 4,000 Units by mouth daily., Disp: , Rfl:  .  Cranberry 400 MG CAPS, Take by mouth., Disp: , Rfl:  .  EPINEPHrine 0.3 mg/0.3 mL IJ SOAJ injection, Inject 0.3 mg into the muscle as needed for anaphylaxis. , Disp: , Rfl:  .  famotidine (PEPCID) 10 MG tablet, Take 20 mg by mouth 2 (two) times daily., Disp: , Rfl:  .  fluticasone (FLONASE) 50 MCG/ACT nasal spray, Place 2 sprays into both nostrils daily., Disp: 16 g, Rfl: 6 .  Methylcobalamin (METHYL B-12 PO), Take by mouth as needed., Disp: , Rfl:  .  metoprolol tartrate  (LOPRESSOR) 25 MG tablet, TAKE 1/2 TABLET(12.5 MG) BY MOUTH TWICE DAILY, Disp: 90 tablet, Rfl: 3 .  Omega-3 Fatty Acids (FISH OIL) 1200 MG CAPS, Take by mouth., Disp: , Rfl:  .  Pediatric Multivit-Minerals-C (CHILDRENS VITAMINS PO), Take 1 tablet by mouth daily. , Disp: , Rfl:  .  Polyethyl Glycol-Propyl Glycol (SYSTANE OP), Place 1 drop into both eyes at bedtime., Disp: , Rfl:  .  Probiotic Product (PROBIOTIC ADVANCED PO), Take by mouth., Disp: , Rfl:   Allergies  Allergen Reactions  . Avocado Shortness Of Breath and Nausea And Vomiting  . Macadamia Nut Oil Hives and Swelling    LIPS SWELL   . Mango Flavor Swelling    SWELLING OF THE LIPS  . Other Other (See Comments)    [name]  . Ciprofloxacin Other (See Comments)    SEVERE JOINT PAIN  . Demerol [Meperidine] Other (See Comments)    HALLUCINATIONS   . Iodinated Diagnostic Agents Hives and Other (See Comments)    Patient had IVP @ age 75 and broke out in Hives (was once pre-treated with several meds)  . Macrobid [Nitrofurantoin] Other (See Comments)    CHEST PAIN  . Betadine [Povidone Iodine]     hives  . Latex Swelling    Please use paper tape or nitrile gloves  . Progesterone   . Dulcolax [Bisacodyl] Palpitations    Felt light headed and dizzy  . Sulfamethoxazole Nausea Only and Other (See Comments)    Childhood reaction - upset stomach  . Tape Rash and Other (See Comments)    Family allergy; this is to be avoided; tolerates paper tape    OBJECTIVE: LMP 05/20/2014 (Approximate)  Gen: Afebrile. No acute distress.  HENT: AT. [place]. Eyes:Pupils Equal Round Reactive to light, Extraocular movements intact,  Conjunctiva without redness, discharge or icterus. Chest: no cough or shortness of breath Skin: no rashes, purpura or petechiae.  Neuro:Alert. Oriented Psych: Normal affect, dress and demeanor. Normal speech. Normal  thought content and judgment..    ASSESSMENT AND PLAN: Emily Joseph is a 50 y.o. female present for  1. Bronchitis due to COVID-19 virus Continue mucinex (DM if cough), pepcid, hydration and rest azith prescribed, take until completed.  If cough present it can last up to 6-8 weeks.  F/U 2 weeks if not improved.    Howard Pouch, DO 12/19/2020   No orders of the defined types were placed in this encounter.  Meds ordered this encounter  Medications  . azithromycin (ZITHROMAX) 250 MG tablet    Sig: 500 mg PO day 1, then 250 PO mg QD    Dispense:  6 tablet    Refill:  0   Referral Orders  No referral(s) requested today

## 2021-03-12 ENCOUNTER — Telehealth: Payer: Self-pay

## 2021-03-12 NOTE — Telephone Encounter (Signed)
I called patient, patient given # to [facility]

## 2021-03-12 NOTE — Telephone Encounter (Signed)
Patient left a voicemail stating she had an appointment last June with Dr. Estanislado Pandy and just received a bill yesterday for $2,000 from the lab she was sent to.  Patient states she has been on the phone for 40 min with her insurance company and was told that Exogen is not covered through her plan and that she is responsible for the bill.  Patient states she wants to speak with someone about that and make sure that this year when I come for my appointment that the labwork will be covered by [facility].

## 2021-03-20 ENCOUNTER — Other Ambulatory Visit: Payer: Self-pay

## 2021-03-20 ENCOUNTER — Ambulatory Visit (INDEPENDENT_AMBULATORY_CARE_PROVIDER_SITE_OTHER): Payer: BC Managed Care – PPO | Admitting: Family Medicine

## 2021-03-20 ENCOUNTER — Encounter: Payer: Self-pay | Admitting: Family Medicine

## 2021-03-20 DIAGNOSIS — M542 Cervicalgia: Secondary | ICD-10-CM

## 2021-03-20 NOTE — Progress Notes (Signed)
Office Visit Note   Patient: Emily Joseph           Date of Birth: October 01, 1971           MRN: [id number] Visit Date: 03/20/2021 Requested by: Ma Hillock, DO [address] [place],   [address] PCP: Ma Hillock, DO  Subjective: Chief Complaint  Patient presents with  . Neck - Pain    Neck pain with numbness and pain down the arm to the hand. Started about 1 year ago, with increased pain in the Fall of last year. Has been seeing the same chiropractor off & on x 18 years. Multiple joint pains. Muscle tightness in both arms. Sees a rheumatologist.    HPI: She is here with neck pain.  She has had chronic problems with her neck, but it has been worse since this past fall.  She has a history of autoimmune disease.  She has fibromyalgia as well.  Pain is in the upper back/lower part of the neck, and occasionally radiates into the left arm.  She has seen Dr. Jimmye Norman for chiropractic treatments, she has tried various mattresses.  She cannot seem to get her pain to a manageable level.  She had x-rays done at Dr. Jimmye Norman office.               ROS:   All other systems were reviewed and are negative.  Objective: Vital Signs: LMP 05/20/2014 (Approximate)   Physical Exam:  General:  Alert and oriented, in no acute distress. Pulm:  Breathing unlabored. Psy:  Normal mood, congruent affect.  Neck: She has full range of motion with negative Spurling's test.  She has pain with side bending bilaterally.  She is very tender to the left of midline at C3-4 and has a palpable trigger point there.  She has multiple trigger points in the trapezius muscles.  Upper extremity strength and reflexes are normal.  Imaging: No results found.  Assessment & Plan: 1.  Chronic neck pain, probably mostly myofascial.  Cannot rule out cervical disc protrusion.  Neurologic exam is nonfocal. -[facility].  If she fails to improve, then MRI scan.     Procedures: No procedures  performed        PMFS History: Patient Active Problem List   Diagnosis Date Noted  . H/O laminectomy 09/07/2020  . History of laparoscopic-assisted vaginal hysterectomy 09/07/2020  . Arthritis 09/07/2020  . Female stress incontinence 09/07/2020  . Heart disease 09/07/2020  . Primary osteoarthritis of left knee 05/31/2020  . Pelvic floor dysfunction 12/28/2018  . Chronic RUQ pain - exacerbation 12/28/2018  . Atrial flutter with rapid ventricular response ([place]) 10/27/2016  . UTI (urinary tract infection) 07/14/2016  . 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 Chronic pain 06/08/2016  . Chronic fatigue 06/08/2016  . GERD (gastroesophageal reflux disease) with esophagitis 06/08/2016  . Palpitations 06/08/2016  . Anxiety 06/08/2016  . Multiple environmental allergies 03/04/2016  . Multiple food allergies 03/04/2016  . Irritable bowel syndrome with constipation 01/27/2013  . Fibromyalgia 01/07/2007   Past Medical History:  Diagnosis Date  . Adenomyosis    not papanicolaou smear of cervix and cervical HPV  . Allergy   . Anxiety 06/08/2016   -about her health and about taking medications  . Arthritis    ?  Marland Kitchen Asthma   . 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  Chronic fatigue 06/08/2016   -eval with rheum x2, neurology, gastroenterology  . Chronic pain 06/08/2016   -all over her whole life; joints, muscles, head -numerous evaluations, Dr. Trudie Reed in 2017, [facility] -seeing Dr. Jaynee Eagles, Neurologist for back pain with radicular symptoms  . Complication of anesthesia    "with epidural bp bottoms out"  . Dysrhythmia    fast hr  . Eosinophilic esophagitis    Sees Dr. Carlean Purl  . Fibromyalgia   . GERD (gastroesophageal reflux disease)   . Heart murmur   . History of atrial flutter 10/2016   spontaneous conversion to sinus  . History of gallstones   . History of pneumonia    when pt was pregnant  . History of pneumothorax 07/2016    Right  . History of sinus tachycardia   . Iron deficiency anemia due to chronic blood loss    Menorrhagia, s/p complete hysterectomy, ovaries remain hx  . Irritable bowel syndrome 01/27/2013   Dr Carlean Purl 02/2016   . Microhematuria   . Mouth problem    failed gum graft 1/16  . Nasal airway abnormality   . Nasal obstruction 06/26/2015   Seen by ENT 06-26-15.  May need sleep study to see if having apnea    . Palpitations   . Pelvic floor dysfunction in female   . 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 Shortness of breath dyspnea   . SUI (stress urinary incontinence, female)   . Syncope    with last child with epideral  . UTI (urinary tract infection) 07/14/2016   >=100,000 COLONIES/mL KLEBSIELLA PNEUMONIA  . Vitamin D deficiency     Family History  Problem Relation Age of Onset  . Cancer Mother   . Breast cancer Mother 77  . Lung cancer Mother   . CAD Father        MI at age 45  . Stroke Father   . Cancer Father        Cholangiocarcinoma  . Goiter Maternal Grandmother   . Cancer Maternal Grandfather   . Lung cancer Maternal Grandfather   . Rheum arthritis Paternal Grandmother   . Stroke Paternal Grandfather   . Kidney disease Paternal Grandfather   . Alport syndrome Paternal Grandfather   . Esophageal cancer Paternal Uncle   . Polycystic ovary syndrome Daughter        pre-diabetic   . Healthy Son   . Allergies Daughter   . Healthy Daughter   . Myasthenia gravis Sister   . Chiari malformation Sister   . Hematuria Sister     Past Surgical History:  Procedure Laterality Date  . APPENDECTOMY  01/1989  . BALLOON DILATION N/A 02/09/2013   Procedure: BALLOON DILATION;  Surgeon: Arta Silence, MD;  Location: [facility];  Service: Endoscopy;  Laterality: N/A;  . BILATERAL SALPINGECTOMY N/A 06/14/2014   Procedure: BILATERAL SALPINGECTOMY;  Surgeon: Allena Katz, MD;  Location: [facility];  Service:  Gynecology;  Laterality: N/A;  . BREAST BIOPSY Right    biopsy   . CHOLECYSTECTOMY N/A 11/21/2019   Procedure: LAPAROSCOPIC CHOLECYSTECTOMY WITH INTRAOPERATIVE CHOLANGIOGRAM;  Surgeon: Alphonsa Overall, MD;  Location: [facility];  Service: General;  Laterality: N/A;  . COLONOSCOPY WITH PROPOFOL N/A 02/09/2013   Procedure: COLONOSCOPY WITH PROPOFOL;  Surgeon: Arta Silence, MD;  Location: [facility];  Service: Endoscopy;  Laterality: N/A;  need ultra thin colon scope  . ESOPHAGOGASTRODUODENOSCOPY (  EGD) WITH PROPOFOL N/A 02/09/2013   Procedure: ESOPHAGOGASTRODUODENOSCOPY (EGD) WITH PROPOFOL;  Surgeon: Arta Silence, MD;  Location: [facility];  Service: Endoscopy;  Laterality: N/A;  . LAPAROSCOPIC ASSISTED VAGINAL HYSTERECTOMY Right 06/14/2014   Procedure: OPEN LAPAROSCOPIC ASSISTED VAGINAL HYSTERECTOMY;  Surgeon: Allena Katz, MD;  Location: [facility];  Service: Gynecology;  Laterality: Right;  . LYSIS OF ADHESION N/A 06/14/2014   Procedure: LYSIS OF ADHESION;  Surgeon: Allena Katz, MD;  Location: [facility];  Service: Gynecology;  Laterality: N/A;  . MINIMALLY INVASIVE EXCISION OF ATRIAL MYXOMA Right 07/11/2016   Procedure: MINIMALLY INVASIVE RESECTION OF RIGHT ATRIAL LIPOMA WITH CLOSURE OF PATENT FORAMEN OVALE;  Surgeon: Rexene Alberts, MD;  Location: [place] [facility];  Service: Open Heart Surgery;  Laterality: Right;  . OVARIAN CYST REMOVAL Right 1990  . TEE WITHOUT CARDIOVERSION N/A 07/11/2016   Procedure: TRANSESOPHAGEAL ECHOCARDIOGRAM (TEE);  Surgeon: Rexene Alberts, MD;  Location: [facility];  Service: Open Heart Surgery;  Laterality: N/A;   Social History   Occupational History  . Not on file  Tobacco Use  . Smoking status: Never Smoker  . Smokeless tobacco: Never Used  Vaping Use  . Vaping Use: Never used  Substance and Sexual Activity  . Alcohol use: Yes    Alcohol/week: 0.0 standard drinks    Comment: 3 glasses wine per week or less   . Drug use: No  . Sexual activity: Yes    Partners: Male     Birth control/protection: Surgical

## 2021-04-05 ENCOUNTER — Emergency Department (INDEPENDENT_AMBULATORY_CARE_PROVIDER_SITE_OTHER)
Admission: EM | Admit: 2021-04-05 | Discharge: 2021-04-05 | Disposition: A | Discharge status: Home / Self Care | Payer: BC Managed Care – PPO | Source: Home / Self Care

## 2021-04-05 ENCOUNTER — Telehealth: Payer: Self-pay | Admitting: Family Medicine

## 2021-04-05 ENCOUNTER — Other Ambulatory Visit: Payer: Self-pay

## 2021-04-05 DIAGNOSIS — J309 Allergic rhinitis, unspecified: Secondary | ICD-10-CM | POA: Diagnosis not present

## 2021-04-05 DIAGNOSIS — J069 Acute upper respiratory infection, unspecified: Secondary | ICD-10-CM

## 2021-04-05 DIAGNOSIS — R059 Cough, unspecified: Secondary | ICD-10-CM

## 2021-04-05 MED ORDER — FEXOFENADINE HCL 180 MG PO TABS: 180.0000 mg | ORAL_TABLET | Freq: Every day | ORAL | 0 refills | Status: DC

## 2021-04-05 MED ORDER — BENZONATATE 200 MG PO CAPS: 200.0000 mg | ORAL_CAPSULE | Freq: Three times a day (TID) | ORAL | 0 refills | Status: AC | PRN

## 2021-04-05 NOTE — Telephone Encounter (Signed)
Emily Joseph with [facility] called stating the pt is wanting to set up PT but her insurance requires a referral. If dr. Junius Roads is ok with pt doing PT Emily Joseph would like the referral faxed over.   Fax# [phone]

## 2021-04-05 NOTE — Discharge Instructions (Addendum)
Advised patient to discontinue current Allegra 30 mg twice a day and start Allegra 180 mg daily for the next 5 days, then as needed for concurrent postnasal drainage/drip.  Advised patient may use Tessalon Perles daily, as needed for cough.

## 2021-04-05 NOTE — Telephone Encounter (Signed)
Please advise 

## 2021-04-05 NOTE — Telephone Encounter (Signed)
Written.

## 2021-04-05 NOTE — Telephone Encounter (Signed)
Faxed Rx

## 2021-04-05 NOTE — ED Provider Notes (Signed)
[facility]    CSN: [id number] Arrival date & time: 04/05/21  1542      History   Chief Complaint Chief Complaint  Patient presents with  . Cough  . Night Sweats    HPI Emily Joseph is a 50 y.o. female.   HPI 50 year old female presents with cough for 2-3 days reports feeling a rattling of her lower back when she coughs.  Patient reports sweats and clammy feeling for the past 2 days.  Reports having COVID-19 and January.  PMH significant for asthma.  Patient reports taking OTC Allegra 30 mg twice daily for concurrent postnasal drainage/drip with some relief.  Past Medical History:  Diagnosis Date  . Adenomyosis    not papanicolaou smear of cervix and cervical HPV  . Allergy   . Anxiety 06/08/2016   -about her health and about taking medications  . Arthritis    ?  Marland Kitchen Asthma   . 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 Chronic fatigue 06/08/2016   -eval with rheum x2, neurology, gastroenterology  . Chronic pain 06/08/2016   -all over her whole life; joints, muscles, head -numerous evaluations, Dr. Trudie Reed in 2017, [place] rheum -seeing Dr. Jaynee Eagles, Neurologist for back pain with radicular symptoms  . Complication of anesthesia    "with epidural bp bottoms out"  . Dysrhythmia    fast hr  . Eosinophilic esophagitis    Sees Dr. Carlean Purl  . Fibromyalgia   . GERD (gastroesophageal reflux disease)   . Heart murmur   . History of atrial flutter 10/2016   spontaneous conversion to sinus  . History of gallstones   . History of pneumonia    when pt was pregnant  . History of pneumothorax 07/2016   Right  . History of sinus tachycardia   . Iron deficiency anemia due to chronic blood loss    Menorrhagia, s/p complete hysterectomy, ovaries remain hx  . Irritable bowel syndrome 01/27/2013   Dr Carlean Purl 02/2016   . Microhematuria   . Mouth problem    failed gum graft 1/16  . Nasal airway abnormality   . Nasal obstruction 06/26/2015   Seen by  ENT 06-26-15.  May need sleep study to see if having apnea    . Palpitations   . Pelvic floor dysfunction in female   . 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 Shortness of breath dyspnea   . SUI (stress urinary incontinence, female)   . Syncope    with last child with epideral  . UTI (urinary tract infection) 07/14/2016   >=100,000 COLONIES/mL KLEBSIELLA PNEUMONIA  . Vitamin D deficiency     Patient Active Problem List   Diagnosis Date Noted  . H/O laminectomy 09/07/2020  . History of laparoscopic-assisted vaginal hysterectomy 09/07/2020  . Arthritis 09/07/2020  . Female stress incontinence 09/07/2020  . Heart disease 09/07/2020  . Primary osteoarthritis of left knee 05/31/2020  . Pelvic floor dysfunction 12/28/2018  . Chronic RUQ pain - exacerbation 12/28/2018  . Atrial flutter with rapid ventricular response ([place]) 10/27/2016  . UTI (urinary tract infection) 07/14/2016  . 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 Chronic pain 06/08/2016  . Chronic fatigue 06/08/2016  . GERD (gastroesophageal reflux disease) with esophagitis 06/08/2016  . Palpitations 06/08/2016  . Anxiety 06/08/2016  . Multiple environmental allergies 03/04/2016  .  Multiple food allergies 03/04/2016  . Irritable bowel syndrome with constipation 01/27/2013  . Fibromyalgia 01/07/2007    Past Surgical History:  Procedure Laterality Date  . APPENDECTOMY  01/1989  . BALLOON DILATION N/A 02/09/2013   Procedure: BALLOON DILATION;  Surgeon: Arta Silence, MD;  Location: [facility];  Service: Endoscopy;  Laterality: N/A;  . BILATERAL SALPINGECTOMY N/A 06/14/2014   Procedure: BILATERAL SALPINGECTOMY;  Surgeon: Allena Katz, MD;  Location: [facility];  Service: Gynecology;  Laterality: N/A;  . BREAST BIOPSY Right    biopsy   . CHOLECYSTECTOMY N/A 11/21/2019   Procedure: LAPAROSCOPIC CHOLECYSTECTOMY WITH INTRAOPERATIVE  CHOLANGIOGRAM;  Surgeon: Alphonsa Overall, MD;  Location: [facility];  Service: General;  Laterality: N/A;  . COLONOSCOPY WITH PROPOFOL N/A 02/09/2013   Procedure: COLONOSCOPY WITH PROPOFOL;  Surgeon: Arta Silence, MD;  Location: [facility];  Service: Endoscopy;  Laterality: N/A;  need ultra thin colon scope  . ESOPHAGOGASTRODUODENOSCOPY (EGD) WITH PROPOFOL N/A 02/09/2013   Procedure: ESOPHAGOGASTRODUODENOSCOPY (EGD) WITH PROPOFOL;  Surgeon: Arta Silence, MD;  Location: [facility];  Service: Endoscopy;  Laterality: N/A;  . LAPAROSCOPIC ASSISTED VAGINAL HYSTERECTOMY Right 06/14/2014   Procedure: OPEN LAPAROSCOPIC ASSISTED VAGINAL HYSTERECTOMY;  Surgeon: Allena Katz, MD;  Location: [facility];  Service: Gynecology;  Laterality: Right;  . LYSIS OF ADHESION N/A 06/14/2014   Procedure: LYSIS OF ADHESION;  Surgeon: Allena Katz, MD;  Location: [facility];  Service: Gynecology;  Laterality: N/A;  . MINIMALLY INVASIVE EXCISION OF ATRIAL MYXOMA Right 07/11/2016   Procedure: MINIMALLY INVASIVE RESECTION OF RIGHT ATRIAL LIPOMA WITH CLOSURE OF PATENT FORAMEN OVALE;  Surgeon: Rexene Alberts, MD;  Location: [facility];  Service: Open Heart Surgery;  Laterality: Right;  . OVARIAN CYST REMOVAL Right 1990  . TEE WITHOUT CARDIOVERSION N/A 07/11/2016   Procedure: TRANSESOPHAGEAL ECHOCARDIOGRAM (TEE);  Surgeon: Rexene Alberts, MD;  Location: [place];  Service: Open Heart Surgery;  Laterality: N/A;    OB History    Gravida  4   Para  4   Term  4   Preterm      AB      Living  4     SAB      IAB      Ectopic      Multiple      Live Births               Home Medications    Prior to Admission medications   Medication Sig Start Date End Date Taking? Authorizing Provider  benzonatate (TESSALON) 200 MG capsule Take 1 capsule (200 mg total) by mouth 3 (three) times daily as needed for up to 7 days for cough. 04/05/21 04/12/21 Yes Eliezer Lofts, FNP  fexofenadine [facility] ALLERGY) 180 MG tablet Take 1  tablet (180 mg total) by mouth daily for 15 days. 04/05/21 04/20/21 Yes Eliezer Lofts, FNP  albuterol (VENTOLIN HFA) 108 (90 Base) MCG/ACT inhaler Inhale into the lungs. 10/08/20   [provider]  Ascorbic Acid (VITAMIN C) 1000 MG tablet Take 1,000 mg by mouth daily.    [provider]  Azelastine HCl 137 MCG/SPRAY SOLN Place into both nostrils. 10/08/20   [provider]  cholecalciferol (VITAMIN D) 25 MCG (1000 UT) tablet Take 4,000 Units by mouth daily.    [provider]  Cranberry 400 MG CAPS Take by mouth.    [provider]  EPINEPHrine 0.3 mg/0.3 mL IJ SOAJ injection Inject 0.3 mg into the muscle as needed  for anaphylaxis.     [provider]  famotidine (PEPCID) 10 MG tablet Take 20 mg by mouth 2 (two) times daily.    [provider]  fluticasone (FLONASE) 50 MCG/ACT nasal spray Place 2 sprays into both nostrils daily. 01/22/17   Lucretia Kern, DO  Methylcobalamin (METHYL B-12 PO) Take by mouth as needed.    [provider]  metoprolol tartrate (LOPRESSOR) 25 MG tablet TAKE 1/2 TABLET(12.5 MG) BY MOUTH TWICE DAILY 09/14/19   Lelon Perla, MD  Omega-3 Fatty Acids (FISH OIL) 1200 MG CAPS Take by mouth.    [provider]  Pediatric Multivit-Minerals-C (CHILDRENS VITAMINS PO) Take 1 tablet by mouth daily.     [provider]  Polyethyl Glycol-Propyl Glycol (SYSTANE OP) Place 1 drop into both eyes at bedtime.    [provider]  Probiotic Product (PROBIOTIC ADVANCED PO) Take by mouth.    [provider]    Family History Family History  Problem Relation Age of Onset  . Cancer Mother   . Breast cancer Mother 21  . Lung cancer Mother   . CAD Father        MI at age 62  . Stroke Father   . Cancer Father        Cholangiocarcinoma  . Goiter Maternal Grandmother   . Cancer Maternal Grandfather   . Lung cancer Maternal Grandfather   . Rheum arthritis Paternal Grandmother   .  Stroke Paternal Grandfather   . Kidney disease Paternal Grandfather   . Alport syndrome Paternal Grandfather   . Esophageal cancer Paternal Uncle   . Polycystic ovary syndrome Daughter        pre-diabetic   . Healthy Son   . Allergies Daughter   . Healthy Daughter   . Myasthenia gravis Sister   . Chiari malformation Sister   . Hematuria Sister     Social History Social History   Tobacco Use  . Smoking status: Never Smoker  . Smokeless tobacco: Never Used  Vaping Use  . Vaping Use: Never used  Substance Use Topics  . Alcohol use: Yes    Alcohol/week: 0.0 standard drinks    Comment: 3 glasses wine per week or less   . Drug use: No     Allergies   Avocado, Macadamia nut oil, Mango flavor, Other, Ciprofloxacin, Demerol [meperidine], Iodinated diagnostic agents, Macrobid [nitrofurantoin], Betadine [povidone iodine], Latex, Progesterone, Dulcolax [bisacodyl], Sulfamethoxazole, and Tape   Review of Systems Review of Systems  Constitutional: Negative.   HENT: Positive for postnasal drip.   Respiratory: Positive for cough.   Cardiovascular: Negative.   Gastrointestinal: Negative.   Genitourinary: Negative.   Musculoskeletal: Negative.   Skin: Negative.   Neurological: Negative.      Physical Exam Triage Vital Signs ED Triage Vitals  Enc Vitals Group     BP 04/05/21 1554 123/80     Pulse Rate 04/05/21 1554 79     Resp 04/05/21 1554 18     Temp 04/05/21 1554 98.4 F (36.9 C)     Temp Source 04/05/21 1554 Oral     SpO2 04/05/21 1554 99 %     Weight --      Height --      Head Circumference --      Peak Flow --      Pain Score 04/05/21 1556 0     Pain Loc --      Pain Edu? --      Excl.  in [place]? --    No data found.  Updated Vital Signs BP 123/80 (BP Location: Right Arm)   Pulse 79   Temp 98.4 F (36.9 C) (Oral)   Resp 18   LMP 05/20/2014 (Approximate)   SpO2 99%      Physical Exam Vitals and nursing note reviewed.  Constitutional:      General:  She is not in acute distress.    Appearance: Normal appearance. She is normal weight. She is not ill-appearing.  HENT:     Head: Normocephalic and atraumatic.     Right Ear: Tympanic membrane and ear canal normal.     Left Ear: Tympanic membrane and ear canal normal.     Nose: Nose normal.     Mouth/Throat:     Mouth: Mucous membranes are moist.     Pharynx: Oropharynx is clear. No posterior oropharyngeal erythema.     Comments: Moderate clear drainage of posterior oropharynx noted Eyes:     Extraocular Movements: Extraocular movements intact.     Conjunctiva/sclera: Conjunctivae normal.     Pupils: Pupils are equal, round, and reactive to light.  Cardiovascular:     Rate and Rhythm: Normal rate and regular rhythm.     Pulses: Normal pulses.     Heart sounds: Normal heart sounds.  Pulmonary:     Effort: Pulmonary effort is normal. No respiratory distress.     Breath sounds: Normal breath sounds. No stridor. No wheezing, rhonchi or rales.     Comments: No adventitious breath sounds noted, infrequent nonproductive cough on exam Musculoskeletal:     Cervical back: Normal range of motion and neck supple. No tenderness.  Lymphadenopathy:     Cervical: No cervical adenopathy.  Skin:    General: Skin is warm and dry.  Neurological:     General: No focal deficit present.     Mental Status: She is alert and oriented to person, place, and time.  Psychiatric:        Mood and Affect: Mood normal.        Behavior: Behavior normal.      UC Treatments / Results  Labs (all labs ordered are listed, but only abnormal results are displayed) Labs Reviewed - No data to display  EKG   Radiology No results found.  Procedures Procedures (including critical care time)  Medications Ordered in UC Medications - No data to display  Initial Impression / Assessment and Plan / UC Course  I have reviewed the triage vital signs and the nursing notes.  Pertinent labs & imaging results that  were available during my care of the patient were reviewed by me and considered in my medical decision making (see chart for details).     MDM: 1. Viral URI with cough, 2.  Allergic rhinitis.  Patient discharged home, hemodynamically stable. Final Clinical Impressions(s) / UC Diagnoses   Final diagnoses:  Cough  Viral URI with cough  Allergic rhinitis, unspecified seasonality, unspecified trigger     Discharge Instructions     Advised patient to discontinue current Allegra 30 mg twice a day and start Allegra 180 mg daily for the next 5 days, then as needed for concurrent postnasal drainage/drip.  Advised patient may use Tessalon Perles daily, as needed for cough.    ED Prescriptions    Medication Sig Dispense Auth. Provider   fexofenadine (ALLEGRA ALLERGY) 180 MG tablet Take 1 tablet (180 mg total) by mouth daily for 15 days. 15 tablet Eliezer Lofts, FNP  benzonatate (TESSALON) 200 MG capsule Take 1 capsule (200 mg total) by mouth 3 (three) times daily as needed for up to 7 days for cough. 30 capsule Eliezer Lofts, FNP     PDMP not reviewed this encounter.   Eliezer Lofts, [place] 04/05/21 1718

## 2021-04-05 NOTE — ED Triage Notes (Signed)
Pt c/o cough x 3 days. Says she feels a rattling in her lower back when she coughs. Sweats/clammy feeling the last two days. Had Melvina in January. Motrin and tylenol as well as inhaler prn. Allegra daily.

## 2021-04-10 ENCOUNTER — Telehealth: Payer: Self-pay | Admitting: Emergency Medicine

## 2021-04-10 MED ORDER — CEFDINIR 300 MG PO CAPS: 300.0000 mg | ORAL_CAPSULE | Freq: Two times a day (BID) | ORAL | 0 refills | Status: AC

## 2021-04-10 NOTE — Telephone Encounter (Signed)
[place].  Patient left a message stating that she is not any better since starting the Allegra and Tessalon Pearles given.  Patient requesting an antibiotic.  [facility].  Please advise.

## 2021-04-10 NOTE — Telephone Encounter (Signed)
Pt notified that Cefdinir has been called in to her pharmacy per M. Enis Gash, [place]. She verbalizes understanding.

## 2021-04-15 ENCOUNTER — Ambulatory Visit: Payer: BC Managed Care – PPO | Admitting: Family Medicine
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
## 2021-05-31 ENCOUNTER — Encounter: Payer: Self-pay | Admitting: Rheumatology

## 2021-05-31 ENCOUNTER — Other Ambulatory Visit: Payer: Self-pay

## 2021-05-31 ENCOUNTER — Ambulatory Visit (INDEPENDENT_AMBULATORY_CARE_PROVIDER_SITE_OTHER): Payer: BC Managed Care – PPO | Admitting: Rheumatology

## 2021-05-31 VITALS — BP 113/76 | HR 78 | Ht 67.0 in | Wt 158.4 lb

## 2021-05-31 DIAGNOSIS — K581 Irritable bowel syndrome with constipation: Secondary | ICD-10-CM

## 2021-05-31 DIAGNOSIS — Z9109 Other allergy status, other than to drugs and biological substances: Secondary | ICD-10-CM

## 2021-05-31 DIAGNOSIS — R768 Other specified abnormal immunological findings in serum: Secondary | ICD-10-CM

## 2021-05-31 DIAGNOSIS — M79642 Pain in left hand: Secondary | ICD-10-CM

## 2021-05-31 DIAGNOSIS — I5189 Other ill-defined heart diseases: Secondary | ICD-10-CM

## 2021-05-31 DIAGNOSIS — Z91018 Allergy to other foods: Secondary | ICD-10-CM

## 2021-05-31 DIAGNOSIS — R5382 Chronic fatigue, unspecified: Secondary | ICD-10-CM

## 2021-05-31 DIAGNOSIS — K2100 Gastro-esophageal reflux disease with esophagitis, without bleeding: Secondary | ICD-10-CM

## 2021-05-31 DIAGNOSIS — M6289 Other specified disorders of muscle: Secondary | ICD-10-CM

## 2021-05-31 DIAGNOSIS — M797 Fibromyalgia: Secondary | ICD-10-CM | POA: Diagnosis not present

## 2021-05-31 DIAGNOSIS — M1712 Unilateral primary osteoarthritis, left knee: Secondary | ICD-10-CM | POA: Diagnosis not present

## 2021-05-31 DIAGNOSIS — F419 Anxiety disorder, unspecified: Secondary | ICD-10-CM

## 2021-05-31 DIAGNOSIS — M79641 Pain in right hand: Secondary | ICD-10-CM

## 2021-06-30 ENCOUNTER — Emergency Department (HOSPITAL_COMMUNITY): Payer: BC Managed Care – PPO | Admitting: Certified Registered Nurse Anesthetist

## 2021-06-30 ENCOUNTER — Encounter (HOSPITAL_COMMUNITY)
Admission: EM | Disposition: A | Discharge status: Home / Self Care | Payer: Self-pay | Source: Home / Self Care | Attending: Emergency Medicine

## 2021-06-30 ENCOUNTER — Encounter (HOSPITAL_COMMUNITY): Payer: Self-pay | Admitting: Emergency Medicine

## 2021-06-30 ENCOUNTER — Ambulatory Visit (HOSPITAL_COMMUNITY)
Admission: EM | Admit: 2021-06-30 | Discharge: 2021-06-30 | Disposition: A | Discharge status: Home / Self Care | Payer: BC Managed Care – PPO | Attending: Emergency Medicine | Admitting: Emergency Medicine

## 2021-06-30 ENCOUNTER — Other Ambulatory Visit: Payer: Self-pay

## 2021-06-30 DIAGNOSIS — K222 Esophageal obstruction: Secondary | ICD-10-CM | POA: Diagnosis not present

## 2021-06-30 DIAGNOSIS — X58XXXA Exposure to other specified factors, initial encounter: Secondary | ICD-10-CM | POA: Insufficient documentation

## 2021-06-30 DIAGNOSIS — Z20822 Contact with and (suspected) exposure to covid-19: Secondary | ICD-10-CM | POA: Diagnosis not present

## 2021-06-30 DIAGNOSIS — W44F3XA Food entering into or through a natural orifice, initial encounter: Secondary | ICD-10-CM

## 2021-06-30 DIAGNOSIS — Q394 Esophageal web: Secondary | ICD-10-CM

## 2021-06-30 DIAGNOSIS — Z888 Allergy status to other drugs, medicaments and biological substances status: Secondary | ICD-10-CM | POA: Diagnosis not present

## 2021-06-30 DIAGNOSIS — R1310 Dysphagia, unspecified: Secondary | ICD-10-CM | POA: Diagnosis not present

## 2021-06-30 DIAGNOSIS — K219 Gastro-esophageal reflux disease without esophagitis: Secondary | ICD-10-CM | POA: Insufficient documentation

## 2021-06-30 DIAGNOSIS — Z881 Allergy status to other antibiotic agents status: Secondary | ICD-10-CM | POA: Diagnosis not present

## 2021-06-30 DIAGNOSIS — Z91040 Latex allergy status: Secondary | ICD-10-CM | POA: Diagnosis not present

## 2021-06-30 DIAGNOSIS — T18128A Food in esophagus causing other injury, initial encounter: Secondary | ICD-10-CM | POA: Insufficient documentation

## 2021-06-30 DIAGNOSIS — K589 Irritable bowel syndrome without diarrhea: Secondary | ICD-10-CM | POA: Diagnosis not present

## 2021-06-30 HISTORY — PX: FOREIGN BODY REMOVAL: SHX962

## 2021-06-30 HISTORY — PX: ESOPHAGOGASTRODUODENOSCOPY: SHX5428

## 2021-06-30 LAB — I-STAT CHEM 8, ED
BUN: 14 mg/dL (ref 6–20)
Calcium, Ion: 1.19 mmol/L (ref 1.15–1.40)
Chloride: 105 mmol/L (ref 98–111)
Creatinine, Ser: 0.6 mg/dL (ref 0.44–1.00)
Glucose, Bld: 137 mg/dL — ABNORMAL HIGH (ref 70–99)
HCT: 40 % (ref 36.0–46.0)
Hemoglobin: 13.6 g/dL (ref 12.0–15.0)
Potassium: 3.7 mmol/L (ref 3.5–5.1)
Sodium: 141 mmol/L (ref 135–145)
TCO2: 26 mmol/L (ref 22–32)

## 2021-06-30 LAB — RESP PANEL BY RT-PCR (FLU A&B, COVID) ARPGX2
Influenza A by PCR: NEGATIVE
Influenza B by PCR: NEGATIVE
SARS Coronavirus 2 by RT PCR: NEGATIVE

## 2021-06-30 SURGERY — EGD (ESOPHAGOGASTRODUODENOSCOPY)
Anesthesia: General | Laterality: Left

## 2021-06-30 MED ORDER — SUCCINYLCHOLINE CHLORIDE 200 MG/10ML IV SOSY
PREFILLED_SYRINGE | INTRAVENOUS | Status: DC | PRN
  Administered 2021-06-30: 100 mg via INTRAVENOUS

## 2021-06-30 MED ORDER — DEXAMETHASONE SODIUM PHOSPHATE 10 MG/ML IJ SOLN
INTRAMUSCULAR | Status: DC | PRN
  Administered 2021-06-30: 5 mg via INTRAVENOUS

## 2021-06-30 MED ORDER — FENTANYL CITRATE (PF) 100 MCG/2ML IJ SOLN
INTRAMUSCULAR | Status: AC
  Filled 2021-06-30: qty 2

## 2021-06-30 MED ORDER — PROPOFOL 10 MG/ML IV BOLUS
INTRAVENOUS | Status: DC | PRN
  Administered 2021-06-30: 60 mg via INTRAVENOUS
  Administered 2021-06-30: 120 mg via INTRAVENOUS

## 2021-06-30 MED ORDER — LIDOCAINE 2% (20 MG/ML) 5 ML SYRINGE
INTRAMUSCULAR | Status: DC | PRN
  Administered 2021-06-30: 60 mg via INTRAVENOUS

## 2021-06-30 MED ORDER — SODIUM CHLORIDE 0.9 % IV SOLN: INTRAVENOUS | Status: DC

## 2021-06-30 MED ORDER — PANTOPRAZOLE SODIUM 40 MG PO TBEC: 40.0000 mg | DELAYED_RELEASE_TABLET | Freq: Two times a day (BID) | ORAL | 3 refills | Status: DC

## 2021-06-30 MED ORDER — FENTANYL CITRATE (PF) 100 MCG/2ML IJ SOLN
INTRAMUSCULAR | Status: DC | PRN
  Administered 2021-06-30: 100 ug via INTRAVENOUS

## 2021-06-30 MED ORDER — MIDAZOLAM HCL 2 MG/2ML IJ SOLN
INTRAMUSCULAR | Status: DC | PRN
  Administered 2021-06-30: 2 mg via INTRAVENOUS

## 2021-06-30 MED ORDER — SODIUM CHLORIDE 0.9 % IV BOLUS
1000.0000 mL | Freq: Once | INTRAVENOUS | Status: DC
Start: 2021-06-30 — End: 2021-07-01

## 2021-06-30 MED ORDER — MIDAZOLAM HCL 2 MG/2ML IJ SOLN
INTRAMUSCULAR | Status: AC
  Filled 2021-06-30: qty 2

## 2021-06-30 MED ORDER — ONDANSETRON HCL 4 MG/2ML IJ SOLN
INTRAMUSCULAR | Status: DC | PRN
  Administered 2021-06-30: 4 mg via INTRAVENOUS

## 2021-06-30 NOTE — Transfer of Care (Signed)
Immediate Anesthesia Transfer of Care Note  Patient: Emily Joseph  Procedure(s) Performed: ESOPHAGOGASTRODUODENOSCOPY (EGD) (Left) FOREIGN BODY REMOVAL  Patient Location: PACU  Anesthesia Type:General  Level of Consciousness: awake, alert  and oriented  Airway & Oxygen Therapy: Patient Spontanous Breathing  Post-op Assessment: Report given to RN and Post -op Vital signs reviewed and stable  Post vital signs: Reviewed and stable  Last Vitals:  Vitals Value Taken Time  BP 116/75 06/30/21 2112  Temp    Pulse 106 06/30/21 2114  Resp 17 06/30/21 2114  SpO2 98 % 06/30/21 2114  Vitals shown include unvalidated device data.  Last Pain:  Vitals:   06/30/21 2018  TempSrc: Oral         Complications: No notable events documented.

## 2021-06-30 NOTE — Consult Note (Signed)
Consultation  Referring Provider:     Varney Biles, MD (ER) Primary Care Physician:  Ma Hillock, DO Primary Gastroenterologist:    Dr. Carlean Purl     Reason for Consultation:     Esophageal food impaction         HPI:   Emily Joseph is a 50 y.o. female with a history of anxiety, EOE, GERD, dysrhythmia, IBS, presenting to the ER with esophageal food impaction.  She was otherwise in her usual state of health, was eating beef tips at around 1500 and felt the food suddenly stuck in her mid chest.  Unable to regurgitate food back out or advance with fluids.  Trialed [employer] in the ER without any improvement.  Does have a history of EOE, but no previous food impactions.  No previous esophageal dilation.  Does not take any medications for this.  Took fish oil yesterday, but otherwise does not take any antiplatelet therapy or anticoagulation.  Endoscopic History: - EGD (02/2013, Dr. Paulita Fujita): Linear furrows, trachealization, multiple esophageal rings (path: 17 eos/hpf).  Normal stomach and duodenum. - Colonoscopy (02/2013, Dr. Paulita Fujita): External hemorrhoids, otherwise normal  Past Medical History:  Diagnosis Date   Adenomyosis    not papanicolaou smear of cervix and cervical HPV   Allergy    Anxiety 06/08/2016   -about her health and about taking medications   Arthritis    ?   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 Trudie Reed in 2017, [place] [facility] -seeing Dr. Jaynee Eagles, Neurologist for back pain with radicular symptoms   Complication of anesthesia    "with epidural bp bottoms out"   Dysrhythmia    fast hr   Eosinophilic esophagitis    Sees Dr. Carlean Purl   Fibromyalgia    GERD (gastroesophageal reflux disease)    Heart murmur    History of atrial flutter 10/2016    spontaneous conversion to sinus   History of gallstones    History of pneumonia    when pt was pregnant   History of pneumothorax 07/2016   Right   History of sinus tachycardia    Iron deficiency anemia due to chronic blood loss    Menorrhagia, s/p complete hysterectomy, ovaries remain hx   Irritable bowel syndrome 01/27/2013   Dr Carlean Purl 02/2016    Microhematuria    Mouth problem    failed gum graft 1/16   Nasal airway abnormality    Nasal obstruction 06/26/2015   Seen by ENT 06-26-15.  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   Shortness of breath dyspnea    SUI (stress urinary incontinence, female)    Syncope    with last child with epideral   UTI (urinary tract infection) 07/14/2016   >=100,000 COLONIES/mL KLEBSIELLA PNEUMONIA   Vitamin D deficiency     Past Surgical History:  Procedure Laterality Date   APPENDECTOMY  01/1989   BALLOON DILATION N/A 02/09/2013   Procedure: BALLOON DILATION;  Surgeon: Arta Silence, MD;  Location: [facility];  Service: Endoscopy;  Laterality:  N/A;   BILATERAL SALPINGECTOMY N/A 06/14/2014   Procedure: BILATERAL SALPINGECTOMY;  Surgeon: Allena Katz, MD;  Location: [facility];  Service: Gynecology;  Laterality: N/A;   BREAST BIOPSY Right    biopsy    CHOLECYSTECTOMY N/A 11/21/2019   Procedure: LAPAROSCOPIC CHOLECYSTECTOMY WITH INTRAOPERATIVE CHOLANGIOGRAM;  Surgeon: Alphonsa Overall, MD;  Location: [facility];  Service: General;  Laterality: N/A;   COLONOSCOPY WITH PROPOFOL N/A 02/09/2013   Procedure: COLONOSCOPY WITH PROPOFOL;  Surgeon: Arta Silence, MD;  Location: [facility];  Service: Endoscopy;  Laterality: N/A;  need ultra thin colon scope   ESOPHAGOGASTRODUODENOSCOPY (EGD) WITH PROPOFOL N/A 02/09/2013   Procedure: ESOPHAGOGASTRODUODENOSCOPY (EGD) WITH PROPOFOL;  Surgeon:  Arta Silence, MD;  Location: [facility];  Service: Endoscopy;  Laterality: N/A;   LAPAROSCOPIC ASSISTED VAGINAL HYSTERECTOMY Right 06/14/2014   Procedure: OPEN LAPAROSCOPIC ASSISTED VAGINAL HYSTERECTOMY;  Surgeon: Allena Katz, MD;  Location: [facility];  Service: Gynecology;  Laterality: Right;   LYSIS OF ADHESION N/A 06/14/2014   Procedure: LYSIS OF ADHESION;  Surgeon: Allena Katz, MD;  Location: [facility];  Service: Gynecology;  Laterality: N/A;   MINIMALLY INVASIVE EXCISION OF ATRIAL MYXOMA Right 07/11/2016   Procedure: MINIMALLY INVASIVE RESECTION OF RIGHT ATRIAL LIPOMA WITH CLOSURE OF PATENT FORAMEN OVALE;  Surgeon: Rexene Alberts, MD;  Location: [facility];  Service: Open Heart Surgery;  Laterality: Right;   OVARIAN CYST REMOVAL Right 1990   TEE WITHOUT CARDIOVERSION N/A 07/11/2016   Procedure: TRANSESOPHAGEAL ECHOCARDIOGRAM (TEE);  Surgeon: Rexene Alberts, MD;  Location: [facility];  Service: Open Heart Surgery;  Laterality: N/A;    Family History  Problem Relation Age of Onset   Cancer Mother    Breast cancer Mother 59   Lung cancer Mother    CAD Father        MI at age 64   Stroke Father    Cancer Father        Cholangiocarcinoma   Goiter Maternal Grandmother    Cancer Maternal Grandfather    Lung cancer Maternal Grandfather    Rheum arthritis Paternal Grandmother    Stroke Paternal Grandfather    Kidney disease Paternal Grandfather    Alport syndrome Paternal Grandfather    Esophageal cancer Paternal Uncle    Polycystic ovary syndrome Daughter        pre-diabetic    Healthy Son    Allergies Daughter    Healthy Daughter    Myasthenia gravis Sister    Chiari malformation Sister    Hematuria Sister      Social History   Tobacco Use   Smoking status: Never   Smokeless tobacco: Never  Vaping Use   Vaping Use: Never used  Substance Use Topics   Alcohol use: Yes    Alcohol/week: 0.0 standard drinks    Comment: 3 glasses wine per week or less    Drug use: No     Prior to Admission medications   Medication Sig Start Date End Date Taking? Authorizing Provider  Ascorbic Acid (VITAMIN C) 1000 MG tablet Take 1,000 mg by mouth daily.   Yes [provider]  cholecalciferol (VITAMIN D) 25 MCG (1000 UT) tablet Take 2,000 Units by mouth daily.   Yes [provider]  Cranberry 400 MG CAPS Take by mouth as needed.   Yes [provider]  fluticasone (FLONASE) 50 MCG/ACT nasal spray Place 2 sprays into both nostrils daily. 01/22/17  Yes Colin Benton R, DO  Methylcobalamin (METHYL B-12  PO) Take by mouth as needed.   Yes [provider]  metoprolol tartrate (LOPRESSOR) 25 MG tablet TAKE 1/2 TABLET(12.5 MG) BY MOUTH TWICE DAILY Patient taking differently: as needed. TAKE 1/2 TABLET(12.5 MG) BY MOUTH TWICE DAILY 09/14/19  Yes Crenshaw, Denice Bors, MD  Omega-3 Fatty Acids (FISH OIL) 1200 MG CAPS Take by mouth.   Yes [provider]  Polyethyl Glycol-Propyl Glycol (SYSTANE OP) Place 1 drop into both eyes at bedtime.   Yes [provider]  Probiotic Product (PROBIOTIC ADVANCED PO) Take by mouth.   Yes [provider]  albuterol (VENTOLIN HFA) 108 (90 Base) MCG/ACT inhaler Inhale into the lungs as needed. 10/08/20   [provider]  EPINEPHrine 0.3 mg/0.3 mL IJ SOAJ injection Inject 0.3 mg into the muscle as needed for anaphylaxis.     [provider]  fexofenadine (ALLEGRA ALLERGY) 180 MG tablet Take 1 tablet (180 mg total) by mouth daily for 15 days. 04/05/21 04/20/21  Eliezer Lofts, FNP    Current Facility-Administered Medications  Medication Dose Route Frequency Provider Last Rate Last Admin   0.9 %  sodium chloride infusion   Intravenous Continuous [place]a Freshour V, DO       [MAR Hold] sodium chloride 0.9 % bolus 1,000 mL  1,000 mL Intravenous Once Varney Biles, MD        Allergies as of 06/30/2021 - Review Complete 06/30/2021  Allergen Reaction Noted   Avocado Shortness Of Breath  and Nausea And Vomiting 01/26/2013   Macadamia nut oil Hives and Swelling 02/07/2013   Mango flavor Swelling 05/29/2014   Other Other (See Comments) 05/29/2014   Ciprofloxacin Other (See Comments) 04/13/2013   Demerol [meperidine] Other (See Comments) 02/07/2013   Iodinated diagnostic agents Hives and Other (See Comments) 10/25/2012   Macrobid [nitrofurantoin] Other (See Comments) 07/04/2016   Betadine [povidone iodine]  11/21/2019   Latex Swelling 05/29/2014   Dulcolax [bisacodyl] Palpitations 07/12/2016   Sulfamethoxazole Nausea Only and Other (See Comments) 09/03/2006   Tape Rash and Other (See Comments) 03/11/2018     Review of Systems:    As per HPI, otherwise negative    Physical Exam:  Vital signs in last 24 hours: Temp:  [98.3 F (36.8 C)-98.5 F (36.9 C)] 98.3 F (36.8 C) (08/21 2018) Pulse Rate:  [81-103] 103 (08/21 2018) Resp:  [14-15] 15 (08/21 2018) BP: (136-161)/(59-88) 161/85 (08/21 2018) SpO2:  [99 %-100 %] 99 % (08/21 2018)   General:   Pleasant female, sitting upright with emesis bag in front of her Neck:  Supple, no crepitus Lungs:  Respirations even and unlabored. Lungs clear to auscultation bilaterally.   No wheezes, crackles, or rhonchi.  Heart:  Regular rate and rhythm; no MRG Abdomen:  Soft, nondistended, nontender. Normal bowel sounds. No appreciable masses or hepatomegaly.  Rectal:  Not performed.  Neurologic:  Alert and  oriented x4;  grossly normal neurologically. Psych:  Alert and cooperative. Normal affect.  LAB RESULTS: Recent Labs    06/30/21 1902  HGB 13.6  HCT 40.0   BMET Recent Labs    06/30/21 1902  NA 141  K 3.7  CL 105  GLUCOSE 137*  BUN 14  CREATININE 0.60   LFT No results for input(s): PROT, ALBUMIN, AST, ALT, ALKPHOS, BILITOT, BILIDIR, IBILI in the last 72 hours. PT/INR No results for input(s): LABPROT, INR in the last 72 hours.  STUDIES: No results found.     Impression / Plan:   1) Esophageal food  impaction 2)  History of eosinophilic esophagitis 3) Dysphagia - Emergent EGD this evening for food retrieval - Plan for elective intubation per discussion with Anesthesia service  The indications, risks, and benefits of EGD were explained to the patient in detail. Risks include but are not limited to bleeding, perforation, adverse reaction to medications, and cardiopulmonary compromise. Sequelae include but are not limited to the possibility of surgery, hospitalization, and mortality. The patient verbalized understanding and wished to proceed.     LOS: 0 days   Lavena Bullion  06/30/2021, 8:35 PM

## 2021-06-30 NOTE — Anesthesia Preprocedure Evaluation (Addendum)
Anesthesia Evaluation  Patient identified by MRN, date of birth, ID band Patient awake    Reviewed: Allergy & Precautions, NPO status , Patient's Chart, lab work & pertinent test results  Airway Mallampati: II  TM Distance: >3 FB Neck ROM: Full    Dental  (+) Teeth Intact   Pulmonary asthma ,    Pulmonary exam normal        Cardiovascular hypertension, Pt. on medications and Pt. on home beta blockers + dysrhythmias Atrial Fibrillation  Rhythm:Regular Rate:Tachycardia     Neuro/Psych Anxiety negative neurological ROS     GI/Hepatic Neg liver ROS, GERD  ,Eosinophilic esophagitis. Food impaction 2/2 steak   Endo/Other  negative endocrine ROS  Renal/GU negative Renal ROS  negative genitourinary   Musculoskeletal  (+) Arthritis , Fibromyalgia -  Abdominal (+)  Abdomen: soft. Bowel sounds: normal.  Peds  Hematology  (+) anemia ,   Anesthesia Other Findings   Reproductive/Obstetrics                            Anesthesia Physical Anesthesia Plan  ASA: 2 and emergent  Anesthesia Plan: General   Post-op Pain Management:    Induction: Intravenous and Rapid sequence  PONV Risk Score and Plan: 3 and Ondansetron, Dexamethasone and Treatment may vary due to age or medical condition  Airway Management Planned: Mask and Oral ETT  Additional Equipment: None  Intra-op Plan:   Post-operative Plan: Extubation in OR  Informed Consent: I have reviewed the patients History and Physical, chart, labs and discussed the procedure including the risks, benefits and alternatives for the proposed anesthesia with the patient or authorized representative who has indicated his/her understanding and acceptance.     Dental advisory given  Plan Discussed with: CRNA  Anesthesia Plan Comments: (Lab Results      Component                Value               Date                      WBC                       8.5                 11/16/2019                HGB                      13.6                06/30/2021                HCT                      40.0                06/30/2021                MCV                      92.0                11/16/2019                PLT  252                 11/16/2019           Lab Results      Component                Value               Date                      NA                       141                 06/30/2021                K                        3.7                 06/30/2021                CO2                      29                  06/27/2020                GLUCOSE                  137 (H)             06/30/2021                BUN                      14                  06/30/2021                CREATININE               0.60                06/30/2021                CALCIUM                  9.3                 06/27/2020                GFRNONAA                 >60                 11/16/2019                GFRAA                    >60                 11/16/2019            ECHO 11/21: 1. Left ventricular ejection fraction, by estimation, is 60 to 65%. The  left ventricle has normal function. The left ventricle has no regional  wall motion abnormalities. Left ventricular diastolic parameters were  normal.  2. Right ventricular systolic  function is normal. The right ventricular  size is normal. There is normal pulmonary artery systolic pressure.  3. Small calcified mass lateral wall of tricuspid annulus, atrial surface  (1.1 x 0.6 cm). No change when compared to prior personally viewed from  2018.  4. The mitral valve is normal in structure. Trivial mitral valve  regurgitation. No evidence of mitral stenosis. Moderate mitral annular  calcification.  5. The aortic valve is normal in structure. Aortic valve regurgitation is  not visualized. No aortic stenosis is present.  6. The inferior vena cava is normal in size with  greater than 50%  respiratory variability, suggesting right atrial pressure of 3 mmHg. )       Anesthesia Quick Evaluation

## 2021-06-30 NOTE — Op Note (Signed)
[facility] Patient Name: Emily Joseph Procedure Date : 06/30/2021 MRN: [id number] Attending MD: Gerrit Heck , MD Date of Birth: 1971-01-17 CSN: [id number] Age: 50 Admit Type: Inpatient Procedure:                Upper GI endoscopy Indications:              Dysphagia, Foreign body in the esophagus (esophagal                            food impaction) Providers:                Gerrit Heck, MD, Particia Nearing, RN, Hinton Dyer,                            Tyrone Apple, Technician Referring MD:              Medicines:                Monitored Anesthesia Care Complications:            No immediate complications. Estimated Blood Loss:     Estimated blood loss was minimal. Procedure:                Pre-Anesthesia Assessment:                           - Prior to the procedure, a History and Physical                            was performed, and patient medications and                            allergies were reviewed. The patient's tolerance of                            previous anesthesia was also reviewed. The risks                            and benefits of the procedure and the sedation                            options and risks were discussed with the patient.                            All questions were answered, and informed consent                            was obtained. Prior Anticoagulants: The patient has                            taken no previous anticoagulant or antiplatelet                            agents. ASA Grade Assessment: IIE - A patient with  mild systemic disease, Emergent. After reviewing                            the risks and benefits, the patient was deemed in                            satisfactory condition to undergo the procedure.                           After obtaining informed consent, the endoscope was                            passed under direct vision. Throughout the                             procedure, the patient's blood pressure, pulse, and                            oxygen saturations were monitored continuously. The                            [id number] Olympus endoscope was introduced                            through the mouth, and advanced to the duodenal                            bulb. The upper GI endoscopy was accomplished                            without difficulty. The patient tolerated the                            procedure well. Scope In: Scope Out: Findings:      Food bolus was found in the lower third of the esophagus. Removal of       food was accomplished using a combination of rat-toother forceps and a       Roth net. The food was cleared completely. The mucosa was mildly       erythematous at the site of the food impaction.      A web was found in the lower third of the esophagus, located 38 cm from       the incisors.      The entire examined stomach was normal.      The examined duodenum was normal. Impression:               - Food in the lower third of the esophagus. Removal                            was successful.                           - Web in the lower third of the esophagus.                           -  Normal stomach.                           - Normal examined duodenum. Recommendation:           - Patient has a contact number available for                            emergencies. The signs and symptoms of potential                            delayed complications were discussed with the                            patient. Return to normal activities tomorrow.                            Written discharge instructions were provided to the                            patient.                           - Full liquid diet for 1 day, then slowly advance                            to mechanical soft (blender consistency) diet. Do                            not advance beyond mechanical soft diet until                             follow-up in the GI Clinic.                           - Continue present medications.                           - Use Protonix (pantoprazole) 40 mg PO BID for 6                            weeks to promote mucosal healing.                           - Return to GI clinic at appointment to be                            scheduled.                           - Repeat upper endoscopy in 4-6 weeks to check                            healing and for possible esophageal dilation. Procedure Code(s):        --- Professional ---  [phone], Esophagogastroduodenoscopy, flexible,                            transoral; with removal of foreign body(s) Diagnosis Code(s):        --- Professional ---                           [id number], Food in esophagus causing other injury,                            initial encounter                           Q39.4, Esophageal web                           R13.10, Dysphagia, unspecified                           T18.108A, Unspecified foreign body in esophagus                            causing other injury, initial encounter CPT copyright 2019 [employer]. All rights reserved. The codes documented in this report are preliminary and upon coder review may  be revised to meet current compliance requirements. Gerrit Heck, MD 06/30/2021 9:06:10 PM Number of Addenda: 0

## 2021-06-30 NOTE — ED Triage Notes (Signed)
Pt reports eating beef tips and getting choked. Pt states she has tried drinking water but can not keep that water down. Pt in NAD at this time.

## 2021-06-30 NOTE — ED Provider Notes (Signed)
[facility] EMERGENCY DEPARTMENT Provider Note   CSN: [id number] Arrival date & time: 06/30/21  1655     History Chief Complaint  Patient presents with   Food Bolus    Emily Joseph is a 50 y.o. female.  HPI     50 year old female with history of anxiety, eosinophilic esophagitis, GERD comes in with chief complaint of food impaction.  Patient reports that prior to ED arrival, she was having steak and other food items, when she suddenly felt that the food was stuck.  Subsequently she has tried to drink water, and it just comes out.  She also has pain in her epigastric and thoracic region.  She also has pain over her back.   No history of food impaction before.  Past Medical History:  Diagnosis Date   Adenomyosis    not papanicolaou smear of cervix and cervical HPV   Allergy    Anxiety 06/08/2016   -about her health and about taking medications   Arthritis    ?   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 Trudie Reed in 2017, [place] rheum -seeing Dr. Jaynee Eagles, Neurologist for back pain with radicular symptoms   Complication of anesthesia    "with epidural bp bottoms out"   Dysrhythmia    fast hr   Eosinophilic esophagitis    Sees Dr. Carlean Purl   Fibromyalgia    GERD (gastroesophageal reflux disease)    Heart murmur    History of atrial flutter 10/2016   spontaneous conversion to sinus   History of gallstones    History of pneumonia    when pt was pregnant   History of pneumothorax 07/2016   Right   History of sinus tachycardia    Iron deficiency anemia due to chronic blood loss    Menorrhagia, s/p complete hysterectomy, ovaries remain hx   Irritable bowel syndrome 01/27/2013   Dr Carlean Purl 02/2016    Microhematuria    Mouth problem    failed gum  graft 1/16   Nasal airway abnormality    Nasal obstruction 06/26/2015   Seen by ENT 06-26-15.  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   Shortness of breath dyspnea    SUI (stress urinary incontinence, female)    Syncope    with last child with epideral   UTI (urinary tract infection) 07/14/2016   >=100,000 COLONIES/mL KLEBSIELLA PNEUMONIA   Vitamin D deficiency     Patient Active Problem List   Diagnosis Date Noted   H/O laminectomy 09/07/2020   History of laparoscopic-assisted vaginal hysterectomy 09/07/2020   Arthritis 09/07/2020   Female stress incontinence 09/07/2020   Heart disease 09/07/2020   Primary osteoarthritis of left knee 05/31/2020   Pelvic floor dysfunction 12/28/2018   Chronic RUQ pain - exacerbation 12/28/2018   Atrial flutter with rapid ventricular response ([place]) 10/27/2016   UTI (urinary tract infection) 07/14/2016   s/p minimally invasive resection of right atrial lipoma    Chronic pain 06/08/2016   Chronic fatigue 06/08/2016   GERD (gastroesophageal  reflux disease) with esophagitis 06/08/2016   Palpitations 06/08/2016   Anxiety 06/08/2016   Multiple environmental allergies 03/04/2016   Multiple food allergies 03/04/2016   Irritable bowel syndrome with constipation 01/27/2013   Fibromyalgia 01/07/2007    Past Surgical History:  Procedure Laterality Date   APPENDECTOMY  01/1989   BALLOON DILATION N/A 02/09/2013   Procedure: BALLOON DILATION;  Surgeon: Arta Silence, MD;  Location: [facility];  Service: Endoscopy;  Laterality: N/A;   BILATERAL SALPINGECTOMY N/A 06/14/2014   Procedure: BILATERAL SALPINGECTOMY;  Surgeon: Allena Katz, MD;  Location: [facility];  Service: Gynecology;  Laterality: N/A;   BREAST BIOPSY Right    biopsy    CHOLECYSTECTOMY N/A 11/21/2019   Procedure:  LAPAROSCOPIC CHOLECYSTECTOMY WITH INTRAOPERATIVE CHOLANGIOGRAM;  Surgeon: Alphonsa Overall, MD;  Location: [facility];  Service: General;  Laterality: N/A;   COLONOSCOPY WITH PROPOFOL N/A 02/09/2013   Procedure: COLONOSCOPY WITH PROPOFOL;  Surgeon: Arta Silence, MD;  [facility];  Service: Endoscopy;  Laterality: N/A;  need ultra thin colon scope   ESOPHAGOGASTRODUODENOSCOPY (EGD) WITH PROPOFOL N/A 02/09/2013   Procedure: ESOPHAGOGASTRODUODENOSCOPY (EGD) WITH PROPOFOL;  Surgeon: Arta Silence, MD;  [facility];  Service: Endoscopy;  Laterality: N/A;   LAPAROSCOPIC ASSISTED VAGINAL HYSTERECTOMY Right 06/14/2014   Procedure: OPEN LAPAROSCOPIC ASSISTED VAGINAL HYSTERECTOMY;  Surgeon: Allena Katz, MD;  Location: [facility];  Service: Gynecology;  Laterality: Right;   LYSIS OF ADHESION N/A 06/14/2014   Procedure: LYSIS OF ADHESION;  Surgeon: Allena Katz, MD;  Location: [facility];  Service: Gynecology;  Laterality: N/A;   MINIMALLY INVASIVE EXCISION OF ATRIAL MYXOMA Right 07/11/2016   Procedure: MINIMALLY INVASIVE RESECTION OF RIGHT ATRIAL LIPOMA WITH CLOSURE OF PATENT FORAMEN OVALE;  Surgeon: Rexene Alberts, MD;  Location: [facility];  Service: Open Heart Surgery;  Laterality: Right;   OVARIAN CYST REMOVAL Right 1990   TEE WITHOUT CARDIOVERSION N/A 07/11/2016   Procedure: TRANSESOPHAGEAL ECHOCARDIOGRAM (TEE);  Surgeon: Rexene Alberts, MD;  Location: [place];  Service: Open Heart Surgery;  Laterality: N/A;     OB History     Gravida  4   Para  4   Term  4   Preterm      AB      Living  4      SAB      IAB      Ectopic      Multiple      Live Births              Family History  Problem Relation Age of Onset   Cancer Mother    Breast cancer Mother 60   Lung cancer Mother    CAD Father        MI at age 26   Stroke Father    Cancer Father        Cholangiocarcinoma   Goiter Maternal Grandmother    Cancer Maternal Grandfather    Lung cancer Maternal Grandfather     Rheum arthritis Paternal Grandmother    Stroke Paternal Grandfather    Kidney disease Paternal Grandfather    Alport syndrome Paternal Grandfather    Esophageal cancer Paternal Uncle    Polycystic ovary syndrome Daughter        pre-diabetic    Healthy Son    Allergies Daughter    Healthy Daughter    Myasthenia gravis Sister    Chiari malformation Sister    Hematuria Sister     Social  History   Tobacco Use   Smoking status: Never   Smokeless tobacco: Never  Vaping Use   Vaping Use: Never used  Substance Use Topics   Alcohol use: Yes    Alcohol/week: 0.0 standard drinks    Comment: 3 glasses wine per week or less    Drug use: No    Home Medications Prior to Admission medications   Medication Sig Start Date End Date Taking? Authorizing Provider  albuterol (VENTOLIN HFA) 108 (90 Base) MCG/ACT inhaler Inhale into the lungs as needed. 10/08/20   [provider]  Ascorbic Acid (VITAMIN C) 1000 MG tablet Take 1,000 mg by mouth daily.    [provider]  cholecalciferol (VITAMIN D) 25 MCG (1000 UT) tablet Take 2,000 Units by mouth daily.    [provider]  Cranberry 400 MG CAPS Take by mouth as needed.    [provider]  EPINEPHrine 0.3 mg/0.3 mL IJ SOAJ injection Inject 0.3 mg into the muscle as needed for anaphylaxis.     [provider]  fexofenadine (ALLEGRA ALLERGY) 180 MG tablet Take 1 tablet (180 mg total) by mouth daily for 15 days. 04/05/21 04/20/21  Eliezer Lofts, FNP  fluticasone (FLONASE) 50 MCG/ACT nasal spray Place 2 sprays into both nostrils daily. 01/22/17   Lucretia Kern, DO  Methylcobalamin (METHYL B-12 PO) Take by mouth as needed.    [provider]  metoprolol tartrate (LOPRESSOR) 25 MG tablet TAKE 1/2 TABLET(12.5 MG) BY MOUTH TWICE DAILY Patient taking differently: as needed. TAKE 1/2 TABLET(12.5 MG) BY MOUTH TWICE DAILY 09/14/19   Lelon Perla, MD  Omega-3 Fatty Acids (FISH OIL) 1200 MG CAPS Take by  mouth.    [provider]  Polyethyl Glycol-Propyl Glycol (SYSTANE OP) Place 1 drop into both eyes at bedtime.    [provider]  Probiotic Product (PROBIOTIC ADVANCED PO) Take by mouth.    [provider]    Allergies    Avocado, Macadamia nut oil, Mango flavor, Other, Ciprofloxacin, Demerol [meperidine], Iodinated diagnostic agents, Macrobid [nitrofurantoin], Betadine [povidone iodine], Latex, Dulcolax [bisacodyl], Sulfamethoxazole, and Tape  Review of Systems   Review of Systems  Constitutional:  Positive for activity change.  Respiratory:  Positive for chest tightness. Negative for shortness of breath.   Cardiovascular:  Positive for chest pain.  Gastrointestinal:  Negative for nausea and vomiting.  Allergic/Immunologic: Negative for immunocompromised state.  Hematological:  Does not bruise/bleed easily.   Physical Exam Updated Vital Signs BP (!) 136/59   Pulse 81   Temp 98.5 F (36.9 C) (Oral)   Resp 15   LMP 05/20/2014 (Approximate)   SpO2 100%   Physical Exam Vitals and nursing note reviewed.  Constitutional:      General: She is in acute distress.     Appearance: She is well-developed.  HENT:     Head: Atraumatic.  Cardiovascular:     Rate and Rhythm: Normal rate.  Pulmonary:     Effort: Pulmonary effort is normal.  Musculoskeletal:     Cervical back: Normal range of motion and neck supple.  Skin:    General: Skin is warm and dry.  Neurological:     Mental Status: She is alert and oriented to person, place, and time.    ED Results / Procedures / Treatments   Labs (all labs ordered are listed, but only abnormal results are displayed) Labs Reviewed  I-STAT CHEM 8, ED - Abnormal; Notable for the following components:  Result Value   Glucose, Bld 137 (*)    All other components within normal limits  RESP PANEL BY RT-PCR (FLU A&B, COVID) ARPGX2    EKG None  Radiology No results found.  Procedures Procedures    Medications Ordered in ED Medications  sodium chloride 0.9 % bolus 1,000 mL (has no administration in time range)    ED Course  I have reviewed the triage vital signs and the nursing notes.  Pertinent labs & imaging results that were available during my care of the patient were reviewed by me and considered in my medical decision making (see chart for details).    MDM Rules/Calculators/A&P                          50 year old comes in a chief complaint of difficulty swallowing, pain with swallowing and regurgitation of the food.  She has esophageal food impaction most likely.  She declined glucagon, nitro due to several allergies she has.  She did attempt drinking Coca-Cola -in the carbonated beverage did not alleviate her symptoms.  I spoke with Dr. Loletha Grayer at [facility], he will see the patient.   Final Clinical Impression(s) / ED Diagnoses Final diagnoses:  Esophageal obstruction due to food impaction    Rx / DC Orders ED Discharge Orders     None        Varney Biles, MD 06/30/21 2012

## 2021-06-30 NOTE — Discharge Instructions (Addendum)
YOU HAD AN ENDOSCOPIC PROCEDURE TODAY: Refer to the procedure report and other information in the discharge instructions given to you for any specific questions about what was found during the examination. If this information does not answer your questions, please call [place] office at [phone] to clarify.   YOU SHOULD EXPECT: Some feelings of bloating in the abdomen. Passage of more gas than usual. Walking can help get rid of the air that was put into your GI tract during the procedure and reduce the bloating. If you had a lower endoscopy (such as a colonoscopy or flexible sigmoidoscopy) you may notice spotting of blood in your stool or on the toilet paper. Some abdominal soreness may be present for a day or two, also.  DIET: Your first meal following the procedure should be a light meal and then it is ok to progress to your normal diet. A half-sandwich or bowl of soup is an example of a good first meal. Heavy or fried foods are harder to digest and may make you feel nauseous or bloated. Drink plenty of fluids but you should avoid alcoholic beverages for 24 hours. If you had a esophageal dilation, please see attached instructions for diet.    ACTIVITY: Your care partner should take you home directly after the procedure. You should plan to take it easy, moving slowly for the rest of the day. You can resume normal activity the day after the procedure however YOU SHOULD NOT DRIVE, use power tools, machinery or perform tasks that involve climbing or major physical exertion for 24 hours (because of the sedation medicines used during the test).   SYMPTOMS TO REPORT IMMEDIATELY: A gastroenterologist can be reached at any hour. Please call [phone]  for any of the following symptoms:   Following upper endoscopy (EGD, EUS, ERCP, esophageal dilation) Vomiting of blood or coffee ground material  New, significant abdominal pain  New, significant chest pain or pain under the shoulder blades  Painful or  persistently difficult swallowing  New shortness of breath  Black, tarry-looking or red, bloody stools  FOLLOW UP:  If any biopsies were taken you will be contacted by phone or by letter within the next 1-3 weeks. Call [phone]  if you have not heard about the biopsies in 3 weeks.  Please also call with any specific questions about appointments or follow up tests.  

## 2021-06-30 NOTE — Anesthesia Procedure Notes (Signed)
Procedure Name: Intubation Date/Time: 06/30/2021 8:43 PM Performed by: Dorthea Cove, CRNA Pre-anesthesia Checklist: Patient identified, Emergency Drugs available, Suction available and Patient being monitored Patient Re-evaluated:Patient Re-evaluated prior to induction Oxygen Delivery Method: Circle system utilized Preoxygenation: Pre-oxygenation with 100% oxygen Induction Type: IV induction, Cricoid Pressure applied and Rapid sequence Laryngoscope Size: Mac and 3 Grade View: Grade I Tube type: Oral Tube size: 7.0 mm Number of attempts: 1 Airway Equipment and Method: Stylet and Oral airway Placement Confirmation: ETT inserted through vocal cords under direct vision, positive ETCO2 and breath sounds checked- equal and bilateral Secured at: 21 cm Tube secured with: Tape Dental Injury: Teeth and Oropharynx as per pre-operative assessment

## 2021-06-30 NOTE — H&P (View-Only) (Signed)
Consultation  Referring Provider:     Varney Biles, MD (ER) Primary Care Physician:  Ma Hillock, DO Primary Gastroenterologist:    Dr. Carlean Purl     Reason for Consultation:     Esophageal food impaction         HPI:   Emily Joseph is a 50 y.o. female with a history of anxiety, EOE, GERD, dysrhythmia, IBS, presenting to the ER with esophageal food impaction.  She was otherwise in her usual state of health, was eating beef tips at around 1500 and felt the food suddenly stuck in her mid chest.  Unable to regurgitate food back out or advance with fluids.  Trialed Coca-Cola in the ER without any improvement.  Does have a history of EOE, but no previous food impactions.  No previous esophageal dilation.  Does not take any medications for this.  Took fish oil yesterday, but otherwise does not take any antiplatelet therapy or anticoagulation.  Endoscopic History: - EGD (02/2013, Dr. Paulita Fujita): Linear furrows, trachealization, multiple esophageal rings (path: 17 eos/hpf).  Normal stomach and duodenum. - Colonoscopy (02/2013, Dr. Paulita Fujita): External hemorrhoids, otherwise normal  Past Medical History:  Diagnosis Date   Adenomyosis    not papanicolaou smear of cervix and cervical HPV   Allergy    Anxiety 06/08/2016   -about her health and about taking medications   Arthritis    ?   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 Trudie Reed in 2017, [place] [facility] -seeing Dr. Jaynee Eagles, Neurologist for back pain with radicular symptoms   Complication of anesthesia    "with epidural bp bottoms out"   Dysrhythmia    fast hr   Eosinophilic esophagitis    Sees Dr. Carlean Purl   Fibromyalgia    GERD (gastroesophageal reflux disease)    Heart murmur    History of atrial flutter 10/2016    spontaneous conversion to sinus   History of gallstones    History of pneumonia    when pt was pregnant   History of pneumothorax 07/2016   Right   History of sinus tachycardia    Iron deficiency anemia due to chronic blood loss    Menorrhagia, s/p complete hysterectomy, ovaries remain hx   Irritable bowel syndrome 01/27/2013   Dr Carlean Purl 02/2016    Microhematuria    Mouth problem    failed gum graft 1/16   Nasal airway abnormality    Nasal obstruction 06/26/2015   Seen by ENT 06-26-15.  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   Shortness of breath dyspnea    SUI (stress urinary incontinence, female)    Syncope    with last child with epideral   UTI (urinary tract infection) 07/14/2016   >=100,000 COLONIES/mL KLEBSIELLA PNEUMONIA   Vitamin D deficiency     Past Surgical History:  Procedure Laterality Date   APPENDECTOMY  01/1989   BALLOON DILATION N/A 02/09/2013   Procedure: BALLOON DILATION;  Surgeon: Arta Silence, MD;  [facility];  Service: Endoscopy;  Laterality:  N/A;   BILATERAL SALPINGECTOMY N/A 06/14/2014   Procedure: BILATERAL SALPINGECTOMY;  Surgeon: Allena Katz, MD;  Location: [facility];  Service: Gynecology;  Laterality: N/A;   BREAST BIOPSY Right    biopsy    CHOLECYSTECTOMY N/A 11/21/2019   Procedure: LAPAROSCOPIC CHOLECYSTECTOMY WITH INTRAOPERATIVE CHOLANGIOGRAM;  Surgeon: Alphonsa Overall, MD;  [facility];  Service: General;  Laterality: N/A;   COLONOSCOPY WITH PROPOFOL N/A 02/09/2013   Procedure: COLONOSCOPY WITH PROPOFOL;  Surgeon: Arta Silence, MD;  [facility];  Service: Endoscopy;  Laterality: N/A;  need ultra thin colon scope   ESOPHAGOGASTRODUODENOSCOPY (EGD) WITH PROPOFOL N/A 02/09/2013   Procedure: ESOPHAGOGASTRODUODENOSCOPY (EGD) WITH PROPOFOL;  Surgeon:  Arta Silence, MD;  [facility];  Service: Endoscopy;  Laterality: N/A;   LAPAROSCOPIC ASSISTED VAGINAL HYSTERECTOMY Right 06/14/2014   Procedure: OPEN LAPAROSCOPIC ASSISTED VAGINAL HYSTERECTOMY;  Surgeon: Allena Katz, MD;  Location: [facility];  Service: Gynecology;  Laterality: Right;   LYSIS OF ADHESION N/A 06/14/2014   Procedure: LYSIS OF ADHESION;  Surgeon: Allena Katz, MD;  Location: [facility];  Service: Gynecology;  Laterality: N/A;   MINIMALLY INVASIVE EXCISION OF ATRIAL MYXOMA Right 07/11/2016   Procedure: MINIMALLY INVASIVE RESECTION OF RIGHT ATRIAL LIPOMA WITH CLOSURE OF PATENT FORAMEN OVALE;  Surgeon: Rexene Alberts, MD;  Location: [facility];  Service: Open Heart Surgery;  Laterality: Right;   OVARIAN CYST REMOVAL Right 1990   TEE WITHOUT CARDIOVERSION N/A 07/11/2016   Procedure: TRANSESOPHAGEAL ECHOCARDIOGRAM (TEE);  Surgeon: Rexene Alberts, MD;  Location: [place];  Service: Open Heart Surgery;  Laterality: N/A;    Family History  Problem Relation Age of Onset   Cancer Mother    Breast cancer Mother 75   Lung cancer Mother    CAD Father        MI at age 29   Stroke Father    Cancer Father        Cholangiocarcinoma   Goiter Maternal Grandmother    Cancer Maternal Grandfather    Lung cancer Maternal Grandfather    Rheum arthritis Paternal Grandmother    Stroke Paternal Grandfather    Kidney disease Paternal Grandfather    Alport syndrome Paternal Grandfather    Esophageal cancer Paternal Uncle    Polycystic ovary syndrome Daughter        pre-diabetic    Healthy Son    Allergies Daughter    Healthy Daughter    Myasthenia gravis Sister    Chiari malformation Sister    Hematuria Sister      Social History   Tobacco Use   Smoking status: Never   Smokeless tobacco: Never  Vaping Use   Vaping Use: Never used  Substance Use Topics   Alcohol use: Yes    Alcohol/week: 0.0 standard drinks    Comment: 3 glasses wine per week or less    Drug use: No     Prior to Admission medications   Medication Sig Start Date End Date Taking? Authorizing Provider  Ascorbic Acid (VITAMIN C) 1000 MG tablet Take 1,000 mg by mouth daily.   Yes [provider]  cholecalciferol (VITAMIN D) 25 MCG (1000 UT) tablet Take 2,000 Units by mouth daily.   Yes [provider]  Cranberry 400 MG CAPS Take by mouth as needed.   Yes [provider]  fluticasone (FLONASE) 50 MCG/ACT nasal spray Place 2 sprays into both nostrils daily. 01/22/17  Yes Colin Benton R, DO  Methylcobalamin (METHYL B-12  PO) Take by mouth as needed.   Yes [provider]  metoprolol tartrate (LOPRESSOR) 25 MG tablet TAKE 1/2 TABLET(12.5 MG) BY MOUTH TWICE DAILY Patient taking differently: as needed. TAKE 1/2 TABLET(12.5 MG) BY MOUTH TWICE DAILY 09/14/19  Yes Crenshaw, Denice Bors, MD  Omega-3 Fatty Acids (FISH OIL) 1200 MG CAPS Take by mouth.   Yes [provider]  Polyethyl Glycol-Propyl Glycol (SYSTANE OP) Place 1 drop into both eyes at bedtime.   Yes [provider]  Probiotic Product (PROBIOTIC ADVANCED PO) Take by mouth.   Yes [provider]  albuterol (VENTOLIN HFA) 108 (90 Base) MCG/ACT inhaler Inhale into the lungs as needed. 10/08/20   [provider]  EPINEPHrine 0.3 mg/0.3 mL IJ SOAJ injection Inject 0.3 mg into the muscle as needed for anaphylaxis.     [provider]  fexofenadine (ALLEGRA ALLERGY) 180 MG tablet Take 1 tablet (180 mg total) by mouth daily for 15 days. 04/05/21 04/20/21  Eliezer Lofts, FNP    Current Facility-Administered Medications  Medication Dose Route Frequency Provider Last Rate Last Admin   0.9 %  sodium chloride infusion   Intravenous Continuous Sophiah Rolin V, DO       [MAR Hold] sodium chloride 0.9 % bolus 1,000 mL  1,000 mL Intravenous Once Varney Biles, MD        Allergies as of 06/30/2021 - Review Complete 06/30/2021  Allergen Reaction Noted   Avocado Shortness Of Breath  and Nausea And Vomiting 01/26/2013   Macadamia nut oil Hives and Swelling 02/07/2013   Mango flavor Swelling 05/29/2014   Other Other (See Comments) 05/29/2014   Ciprofloxacin Other (See Comments) 04/13/2013   Demerol [meperidine] Other (See Comments) 02/07/2013   Iodinated diagnostic agents Hives and Other (See Comments) 10/25/2012   Macrobid [nitrofurantoin] Other (See Comments) 07/04/2016   Betadine [povidone iodine]  11/21/2019   Latex Swelling 05/29/2014   Dulcolax [bisacodyl] Palpitations 07/12/2016   Sulfamethoxazole Nausea Only and Other (See Comments) 09/03/2006   Tape Rash and Other (See Comments) 03/11/2018     Review of Systems:    As per HPI, otherwise negative    Physical Exam:  Vital signs in last 24 hours: Temp:  [98.3 F (36.8 C)-98.5 F (36.9 C)] 98.3 F (36.8 C) (08/21 2018) Pulse Rate:  [81-103] 103 (08/21 2018) Resp:  [14-15] 15 (08/21 2018) BP: (136-161)/(59-88) 161/85 (08/21 2018) SpO2:  [99 %-100 %] 99 % (08/21 2018)   General:   Pleasant female, sitting upright with emesis bag in front of her Neck:  Supple, no crepitus Lungs:  Respirations even and unlabored. Lungs clear to auscultation bilaterally.   No wheezes, crackles, or rhonchi.  Heart:  Regular rate and rhythm; no MRG Abdomen:  Soft, nondistended, nontender. Normal bowel sounds. No appreciable masses or hepatomegaly.  Rectal:  Not performed.  Neurologic:  Alert and  oriented x4;  grossly normal neurologically. Psych:  Alert and cooperative. Normal affect.  LAB RESULTS: Recent Labs    06/30/21 1902  HGB 13.6  HCT 40.0   BMET Recent Labs    06/30/21 1902  NA 141  K 3.7  CL 105  GLUCOSE 137*  BUN 14  CREATININE 0.60   LFT No results for input(s): PROT, ALBUMIN, AST, ALT, ALKPHOS, BILITOT, BILIDIR, IBILI in the last 72 hours. PT/INR No results for input(s): LABPROT, INR in the last 72 hours.  STUDIES: No results found.     Impression / Plan:   1) Esophageal food  impaction 2)  History of eosinophilic esophagitis 3) Dysphagia - Emergent EGD this evening for food retrieval - Plan for elective intubation per discussion with Anesthesia service  The indications, risks, and benefits of EGD were explained to the patient in detail. Risks include but are not limited to bleeding, perforation, adverse reaction to medications, and cardiopulmonary compromise. Sequelae include but are not limited to the possibility of surgery, hospitalization, and mortality. The patient verbalized understanding and wished to proceed.     LOS: 0 days   Lavena Bullion  06/30/2021, 8:35 PM

## 2021-06-30 NOTE — Interval H&P Note (Signed)
History and Physical Interval Note:  06/30/2021 8:41 PM  Emily Joseph  has presented today for surgery, with the diagnosis of Dysphagia, esophageal food impaction.  The various methods of treatment have been discussed with the patient and family. After consideration of risks, benefits and other options for treatment, the patient has consented to  Procedure(s): ESOPHAGOGASTRODUODENOSCOPY (EGD) (Left) as a surgical intervention.  The patient's history has been reviewed, patient examined, no change in status, stable for surgery.  I have reviewed the patient's chart and labs.  Questions were answered to the patient's satisfaction.     [name]

## 2021-07-01 ENCOUNTER — Telehealth: Payer: Self-pay | Admitting: Gastroenterology

## 2021-07-01 ENCOUNTER — Encounter (HOSPITAL_COMMUNITY): Payer: Self-pay | Admitting: Gastroenterology

## 2021-07-01 NOTE — Anesthesia Postprocedure Evaluation (Signed)
Anesthesia Post Note  Patient: Emily Joseph  Procedure(s) Performed: ESOPHAGOGASTRODUODENOSCOPY (EGD) (Left) FOREIGN BODY REMOVAL     Patient location during evaluation: Endoscopy Anesthesia Type: General Level of consciousness: awake and alert Pain management: pain level controlled Vital Signs Assessment: post-procedure vital signs reviewed and stable Respiratory status: spontaneous breathing, nonlabored ventilation, respiratory function stable and patient connected to nasal cannula oxygen Cardiovascular status: blood pressure returned to baseline and stable Postop Assessment: no apparent nausea or vomiting Anesthetic complications: no   No notable events documented.  Last Vitals:  Vitals:   06/30/21 2127 06/30/21 2142  BP: 122/82 123/79  Pulse: 95 91  Resp: 20 20  Temp:  37 C  SpO2: 98% 99%    Last Pain:  Vitals:   06/30/21 2142  TempSrc:   PainSc: 0-No pain                 Emily Joseph P Cally Nygard

## 2021-07-01 NOTE — Telephone Encounter (Signed)
Inbound call from patient requesting a call from a nurse please.  Has questions about her aftercare from her admission to the hospital.

## 2021-07-01 NOTE — Telephone Encounter (Signed)
Spoke with patient, she wanted to clarify her diet post food impaction/EGD. Discussed diet per procedure report. Patient has been scheduled for a follow up with Carl Best, NP on Tuesday, 08/06/21 at 9:30 am. Pt is aware that she can send [place] a my chart message or call with any concerns in the interim. Pt verbalized understanding and had no concerns at the end of the call.

## 2021-07-08 NOTE — Telephone Encounter (Signed)
Agree that this can be discussed at her f/u appt as scheduled. Please note that I had only seen this patient for an emergent food disimpaction in the ER. She was last seen by Dr. Carlean Purl in 2020 and possibly some similar sxs at that time, as he was ordering a SIBO breath testing study, but unclear if this was completed. I am happy to help out in his absence this week though.

## 2021-07-10 NOTE — Telephone Encounter (Signed)
Ok to reduce to prilosec 20 mg BID for continued mucosal healing of the esophagus after recent food impaction.

## 2021-08-02 ENCOUNTER — Telehealth: Payer: Self-pay | Admitting: Internal Medicine

## 2021-08-02 NOTE — Telephone Encounter (Signed)
We reviewed the diet with her and all questions answered. She will keep her appointment with Carl Best, RNP for 9/27

## 2021-08-02 NOTE — Telephone Encounter (Signed)
Inbound call from pt requesting a call back stating that she is having some muscular pain and also has questions as far as her puree diet. Please advise. Thank you.

## 2021-08-05 NOTE — Progress Notes (Signed)
08/05/2021 Emily Joseph [id number] [birth year]   Chief Complaint: Schedule and EGD   History of Present Illness: Emily Joseph. Emily Joseph is a 50 year old female with a past medical history of osteoarthritis, fibromyalgia, asthma, atrial lipoma s/p right atrial resection and repair of patent foramen ovale 07/2016, atrial flutter 10/2016, palpitations on beta blocker PRN, Covid 19 infection 11/2020, liver cyst, GERD and eosinophilic esophagitis.  She is followed by Dr. Carlean Purl.  She presented to the ED 06/30/2021 with a food bolus impaction.  Earlier that day, she felt well and ate beef tip which became stuck in her mid esophagus.  She tried to drink water which was ineffective and she was unable to regurgitate the food back so she presented to the ED for further evaluation.  She underwent an EGD with general anesthesia by Dr. Bryan Lemma which identified food in the lower third of the esophagus which was successfully removed and a web was identified in the lower third of the esophagus, the stomach and duodenum were normal.  She was discharged home with the instructions to take Pantoprazole 40 mg p.o. twice daily and to repeat an EGD in 4 to 6 weeks to assess for healing and possible esophageal dilatation.  She developed increased palpitations within 1 week on Pantoprazole 40 mg p.o. twice daily so it was discontinued.  She was started on Lansoprazole 15 mg p.o. twice daily which she tolerated much better without significant palpitations.  She ran out of her Lansoprazole prescription yesterday so she purchased Omeprazole 20 mg OTC and took the first dose yesterday evening and she will take the second dose this morning.  No noticeable palpitations at this point.  She remains on mechanical soft diet which she is tolerating without further episodes of dysphagia.  She had mild heartburn/stinging to the esophagus for about 1 week post EGD which resolved.  She has chronic abdominal bloat.  She typically passes a normal  bowel movement most days but over the past few days became constipated.  She is taking MiraLAX once to twice daily.  She tolerates "senna chocolate" which she has used in the past and questions if this is okay to take at this point.  No rectal bleeding or black stools.  No other complaints.  She underwent an EGD 02/2013 by Dr. Paulita Fujita which showed multiple esophageal rings and evidence of eosinophilic esophagitis.  A colonoscopy was done on the same date which showed external hemorrhoids.  She stated her EGD and colonoscopy by Dr. Paulita Fujita were scheduled in the hospital setting due to a prior history of a vasovagal response after an epidural was used during childbirth.  She showed me laboratory studies done at [facility] on last 07/23/2021 as follows: WBC 7.3. Hg 13.7. MCV 89.9. PLT 270. BUN 8.5. Cr 0.73. Glu 100. Alk phos 76. AST 15.5. ALT 17.4. T. Bili0.74. TSH 1.30.   EGD 06/30/2021 by Dr. Bryan Lemma: - Food in the lower third of the esophagus. Removal was successful. - Web in the lower third of the esophagus. - Normal stomach. - Normal examined duodenum. Use Protonix (pantoprazole) 40 mg PO BID for 6 weeks to promote mucosal healing. - Return to GI clinic at appointment to be scheduled. - Repeat upper endoscopy in 4-6 weeks to check healing and for possible esophageal dilatio  EGD (02/2013, Dr. Paulita Fujita): Linear furrows, trachealization, multiple esophageal rings (path: 17 eos/hpf).  Normal stomach and duodenum.  Colonoscopy (02/2013, Dr. Paulita Fujita): External hemorrhoids, otherwise normal  Echo 10/01/2020: 1. Left ventricular  ejection fraction, by estimation, is 60 to 65%. The left ventricle has normal function. The left ventricle has no regional wall motion abnormalities. Left ventricular diastolic parameters were normal. 2. Right ventricular systolic function is normal. The right ventricular size is normal. There is normal pulmonary artery systolic pressure. 3. Small calcified mass lateral wall of  tricuspid annulus, atrial surface (1.1 x 0.6 cm). No change when compared to prior personally viewed from 2018. 4. The mitral valve is normal in structure. Trivial mitral valve regurgitation. No evidence of mitral stenosis. Moderate mitral annular calcification. 5. The aortic valve is normal in structure. Aortic valve regurgitation is not visualized. No aortic stenosis is present. 6. The inferior vena cava is normal in size with greater than 50% respiratory variability, suggesting right atrial pressure of 3 mmHg.   CBC Latest Ref Rng & Units 06/30/2021 11/16/2019 07/12/2019  WBC 4.0 - 10.5 K/uL - 8.5 7.5  Hemoglobin 12.0 - 15.0 g/dL 13.6 13.4 13.8  Hematocrit 36.0 - 46.0 % 40.0 40.0 40.4  Platelets 150 - 400 K/uL - 252 231.0     CMP Latest Ref Rng & Units 06/30/2021 06/27/2020 11/16/2019  Glucose 70 - 99 mg/dL 137(H) 113(H) 96  BUN 6 - 20 mg/dL _0 Creatinine 0.44 - 1.00 mg/dL 0.60 0.75 0.72  Sodium 135 - 145 mmol/L 141 138 139  Potassium 3.5 - 5.1 mmol/L 3.7 3.9 4.1  Chloride 98 - 111 mmol/L 105 102 103  CO2 19 - 32 mEq/L - 29 28  Calcium 8.4 - 10.5 mg/dL - 9.3 9.0  Total Protein 6.5 - 8.1 g/dL - - 7.3  Total Bilirubin 0.3 - 1.2 mg/dL - - 0.5  Alkaline Phos 38 - 126 U/L - - 60  AST 15 - 41 U/L - - 16  ALT 0 - 44 U/L - - 17      Past Medical History:  Diagnosis Date   Adenomyosis    not papanicolaou smear of cervix and cervical HPV   Allergy    Anxiety 06/08/2016   -about her health and about taking medications   Arthritis    ?   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 Trudie Reed in 2017, [place] rheum -seeing Dr. Jaynee Eagles, Neurologist for back pain with radicular symptoms   Complication of anesthesia    "with epidural bp bottoms out"   Dysrhythmia    fast hr    Eosinophilic esophagitis    Sees Dr. Carlean Purl   Fibromyalgia    GERD (gastroesophageal reflux disease)    Heart murmur    History of atrial flutter 10/2016   spontaneous conversion to sinus   History of gallstones    History of pneumonia    when pt was pregnant   History of pneumothorax 07/2016   Right   History of sinus tachycardia    Iron deficiency anemia due to chronic blood loss    Menorrhagia, s/p complete hysterectomy, ovaries remain hx   Irritable bowel syndrome 01/27/2013   Dr Carlean Purl 02/2016    Microhematuria    Mouth problem    failed gum graft 1/16   Nasal airway abnormality    Nasal obstruction 06/26/2015   Seen by ENT 06-26-15.  May need sleep study to see if  having apnea     Palpitations    Pelvic floor dysfunction in female    s/p minimally invasive resection of right atrial lipoma    4 cm mass in right atrium c/w benign cardiac lipoma   Shortness of breath dyspnea    SUI (stress urinary incontinence, female)    Syncope    with last child with epideral   UTI (urinary tract infection) 07/14/2016   >=100,000 COLONIES/mL KLEBSIELLA PNEUMONIA   Vitamin D deficiency    Past Surgical History:  Procedure Laterality Date   APPENDECTOMY  01/1989   BALLOON DILATION N/A 02/09/2013   Procedure: BALLOON DILATION;  Surgeon: Arta Silence, MD;  Location: [facility];  Service: Endoscopy;  Laterality: N/A;   BILATERAL SALPINGECTOMY N/A 06/14/2014   Procedure: BILATERAL SALPINGECTOMY;  Surgeon: Allena Katz, MD;  Location: [facility];  Service: Gynecology;  Laterality: N/A;   BREAST BIOPSY Right    biopsy    CHOLECYSTECTOMY N/A 11/21/2019   Procedure: LAPAROSCOPIC CHOLECYSTECTOMY WITH INTRAOPERATIVE CHOLANGIOGRAM;  Surgeon: Alphonsa Overall, MD;  Location: [facility];  Service: General;  Laterality: N/A;   COLONOSCOPY WITH PROPOFOL N/A 02/09/2013   Procedure: COLONOSCOPY WITH PROPOFOL;  Surgeon: Arta Silence, MD;  Location: [facility];  Service: Endoscopy;  Laterality: N/A;  need  ultra thin colon scope   ESOPHAGOGASTRODUODENOSCOPY Left 06/30/2021   Procedure: ESOPHAGOGASTRODUODENOSCOPY (EGD);  Surgeon: Lavena Bullion, DO;  [facility];  Service: Gastroenterology;  Laterality: Left;   ESOPHAGOGASTRODUODENOSCOPY (EGD) WITH PROPOFOL N/A 02/09/2013   Procedure: ESOPHAGOGASTRODUODENOSCOPY (EGD) WITH PROPOFOL;  Surgeon: Arta Silence, MD;  Location: [facility];  Service: Endoscopy;  Laterality: N/A;   FOREIGN BODY REMOVAL  06/30/2021   Procedure: FOREIGN BODY REMOVAL;  Surgeon: Lavena Bullion, DO;  Location: [facility];  Service: Gastroenterology;;   LAPAROSCOPIC ASSISTED VAGINAL HYSTERECTOMY Right 06/14/2014   Procedure: OPEN LAPAROSCOPIC ASSISTED VAGINAL HYSTERECTOMY;  Surgeon: Allena Katz, MD;  Location: [facility];  Service: Gynecology;  Laterality: Right;   LYSIS OF ADHESION N/A 06/14/2014   Procedure: LYSIS OF ADHESION;  Surgeon: Allena Katz, MD;  Location: [facility];  Service: Gynecology;  Laterality: N/A;   MINIMALLY INVASIVE EXCISION OF ATRIAL MYXOMA Right 07/11/2016   Procedure: MINIMALLY INVASIVE RESECTION OF RIGHT ATRIAL LIPOMA WITH CLOSURE OF PATENT FORAMEN OVALE;  Surgeon: Rexene Alberts, MD;  Location: [facility];  Service: Open Heart Surgery;  Laterality: Right;   OVARIAN CYST REMOVAL Right 1990   TEE WITHOUT CARDIOVERSION N/A 07/11/2016   Procedure: TRANSESOPHAGEAL ECHOCARDIOGRAM (TEE);  Surgeon: Rexene Alberts, MD;  Location: [facility];  Service: Open Heart Surgery;  Laterality: N/A;   Current Outpatient Medications on File Prior to Visit  Medication Sig Dispense Refill   albuterol (VENTOLIN HFA) 108 (90 Base) MCG/ACT inhaler Inhale into the lungs as needed.     Ascorbic Acid (VITAMIN C) 1000 MG tablet Take 1,000 mg by mouth daily.     cholecalciferol (VITAMIN D) 25 MCG (1000 UT) tablet Take 2,000 Units by mouth daily.     Cranberry 400 MG CAPS Take by mouth as needed.     EPINEPHrine 0.3 mg/0.3 mL IJ SOAJ injection Inject 0.3 mg into the muscle  as needed for anaphylaxis.      fluticasone (FLONASE) 50 MCG/ACT nasal spray Place 2 sprays into both nostrils daily. 16 g 6   Methylcobalamin (METHYL B-12 PO) Take by mouth as needed.     metoprolol tartrate (LOPRESSOR) 25 MG tablet TAKE 1/2 TABLET(12.5 MG) BY  MOUTH TWICE DAILY (Patient taking differently: as needed. TAKE 1/2 TABLET(12.5 MG) BY MOUTH TWICE DAILY) 90 tablet 3   Omega-3 Fatty Acids (FISH OIL) 1200 MG CAPS Take by mouth.     omeprazole (PRILOSEC) 20 MG capsule Take 20 mg by mouth 2 (two) times daily before a meal.     Polyethyl Glycol-Propyl Glycol (SYSTANE OP) Place 1 drop into both eyes at bedtime.     Probiotic Product (PROBIOTIC ADVANCED PO) Take by mouth.     fexofenadine (ALLEGRA ALLERGY) 180 MG tablet Take 1 tablet (180 mg total) by mouth daily for 15 days. 15 tablet 0   No current facility-administered medications on file prior to visit.   Marland Kitchenall  Current Medications, Allergies, Past Medical History, Past Surgical History, Family History and Social History were reviewed in [employer] record.  Review of Systems:   Constitutional: Negative for fever, sweats, chills or weight loss.  Respiratory: Negative for shortness of breath.   Cardiovascular: See HPI.  No current chest pain, palpitations or shortness of breath. Gastrointestinal: See HPI.  Musculoskeletal: + Fibromyalgia, osteoarthritis pains.  Neurological: Negative for dizziness, headaches or paresthesias.   Physical Exam: LMP 05/20/2014 (Approximate)  BP 110/80   Pulse 96   Ht _0  (1.702 m)   Wt 159 lb (72.1 kg)   LMP 05/20/2014 (Approximate)   SpO2 98%   BMI 24.90 kg/m   General: 50 year old female in no acute distress. Head: Normocephalic and atraumatic. Eyes: No scleral icterus. Conjunctiva pink . Ears: Normal auditory acuity. Mouth: Dentition intact. No ulcers or lesions.  Lungs: Clear throughout to auscultation. Heart: Regular rate and rhythm, no murmur. Abdomen: Soft,  nontender and nondistended. No masses or hepatomegaly. Normal bowel sounds x 4 quadrants.  Rectal: Deferred.  Musculoskeletal: Symmetrical with no gross deformities. Extremities: No edema. Neurological: Alert oriented x 4. No focal deficits.  Psychological: Alert and cooperative. Normal mood and affect  Assessment and Recommendations:  46) 50 year old female with a history of GERD and eosinophilic esophagitis who presented to the ED with a food bolus impaction s/p EGD 06/30/2021 with successful removal of food bolus impaction, a web in the lower third of the esophagus was identified. She was prescribed Pantoprazole 28m po bid which was discontinued one week later due to increased palpitations, switched to Lansoprazole 133mpo bid which was tolerated but she ran out of this prescription then started Omeprazole 2015mo bid one day ago. Repeat EGD to assess for esophageal healing and for possible esophageal dilatation recommended in 4 to 6 week.  [facility] Ges[facility]an Purlnefits and risks discussed including risk with sedation, risk of bleeding, perforation and infection  -Continue Omeprazole 20 mg p.o. twice daily, if not tolerated she will call our office and I will refill lansoprazole 15 mg p.o. twice daily as previously prescribed -Continue mechanical soft diet -Patient to call our office if she develops increased palpitations prior to her EGD procedure date -Further recommendations to be determined after the above evaluation completed  2) Colon cancer screening.  Normal colonoscopy by Dr. OutPaulita Fujita2014. -Colonoscopy due 02/2023  3) Constipation -Miralax Q HS -Senna chocolate PRN as tolerated   4) History of a right atrial lipoma s/p right atrial resection and repair of patent foramen ovale 07/2016, atrial flutter treated with a beta blocker 10/2016

## 2021-08-06 ENCOUNTER — Ambulatory Visit (INDEPENDENT_AMBULATORY_CARE_PROVIDER_SITE_OTHER): Payer: BC Managed Care – PPO | Admitting: Nurse Practitioner

## 2021-08-06 ENCOUNTER — Encounter: Payer: Self-pay | Admitting: Nurse Practitioner

## 2021-08-06 VITALS — BP 110/80 | HR 96 | Ht 67.0 in | Wt 159.0 lb

## 2021-08-06 DIAGNOSIS — K2100 Gastro-esophageal reflux disease with esophagitis, without bleeding: Secondary | ICD-10-CM | POA: Diagnosis not present

## 2021-08-06 DIAGNOSIS — Q394 Esophageal web: Secondary | ICD-10-CM

## 2021-08-06 DIAGNOSIS — K5900 Constipation, unspecified: Secondary | ICD-10-CM

## 2021-08-06 NOTE — Patient Instructions (Addendum)
PROCEDURES: You have been scheduled for a EGD. Please follow the written instructions given to you at your visit today. If you use inhalers (even only as needed), please bring them with you on the day of your procedure.  RECOMMENDATIONS: Continue Omeprazole 20 MG twice a day for now. Call us if you are not tolerating it. Miralax- Dissolve one capful in 8 ounces of water and drink before bed as tolerated. Ok to take Senna chocolate as previously tolerated.  It was great seeing you today! Thank you for entrusting me with your care and choosing [facility].  Noralyn Pick, CRNP  The [facility] providers would like to encourage you to use [facility] to communicate with providers for non-urgent requests or questions.  Due to long hold times on the telephone, sending your provider a message by [facility] may be faster and more efficient way to get a response. Please allow 48 business hours for a response.  Please remember that this is for non-urgent requests/questions.  If you are age 48 or older, your body mass index should be between 23-30. Your Body mass index is 24.9 kg/m. If this is out of the aforementioned range listed, please consider follow up with your Primary Care Provider.  If you are age 11 or younger, your body mass index should be between 19-25. Your Body mass index is 24.9 kg/m. If this is out of the aformentioned range listed, please consider follow up with your Primary Care Provider.

## 2021-08-30 ENCOUNTER — Telehealth: Payer: Self-pay

## 2021-08-30 ENCOUNTER — Other Ambulatory Visit: Payer: Self-pay

## 2021-08-30 NOTE — Telephone Encounter (Signed)
Left message for pt to call back to discuss Metoprolol

## 2021-08-30 NOTE — Telephone Encounter (Signed)
Pt gave a history of issues that she had had in the past that with her HR and BP. Requesting that Dr. Carlean Purl and the team that is doing her procedure at the hospital that the have the Metoprolol on hand if needed. Pt was reassured multiple time that she will be watched very closely on a heart monitor and they have lots of medication to treat her HR and BP if she needs them. Pt was adamant that Dr. Carlean Purl knew this about the metoprolol. Pt was informed that I would let Dr. Carlean Purl know this and that she is going to be a good hands and a great team of hospital staff to take care of her. Pt expressed thanks and stated that this makes her feel better about the procedure.

## 2021-09-04 ENCOUNTER — Encounter (HOSPITAL_COMMUNITY)
Admission: RE | Disposition: A | Discharge status: Home / Self Care | Payer: Self-pay | Source: Ambulatory Visit | Attending: Internal Medicine

## 2021-09-04 ENCOUNTER — Ambulatory Visit (HOSPITAL_COMMUNITY): Payer: BC Managed Care – PPO | Admitting: Anesthesiology

## 2021-09-04 ENCOUNTER — Ambulatory Visit (HOSPITAL_COMMUNITY)
Admission: RE | Admit: 2021-09-04 | Discharge: 2021-09-04 | Disposition: A | Discharge status: Home / Self Care | Payer: BC Managed Care – PPO | Source: Ambulatory Visit | Attending: Internal Medicine | Admitting: Internal Medicine

## 2021-09-04 ENCOUNTER — Encounter (HOSPITAL_COMMUNITY): Payer: Self-pay | Admitting: Internal Medicine

## 2021-09-04 ENCOUNTER — Other Ambulatory Visit: Payer: Self-pay

## 2021-09-04 DIAGNOSIS — Z91040 Latex allergy status: Secondary | ICD-10-CM | POA: Insufficient documentation

## 2021-09-04 DIAGNOSIS — Z882 Allergy status to sulfonamides status: Secondary | ICD-10-CM | POA: Diagnosis not present

## 2021-09-04 DIAGNOSIS — Z885 Allergy status to narcotic agent status: Secondary | ICD-10-CM | POA: Insufficient documentation

## 2021-09-04 DIAGNOSIS — Q394 Esophageal web: Secondary | ICD-10-CM | POA: Diagnosis not present

## 2021-09-04 DIAGNOSIS — Z91048 Other nonmedicinal substance allergy status: Secondary | ICD-10-CM | POA: Insufficient documentation

## 2021-09-04 DIAGNOSIS — Z888 Allergy status to other drugs, medicaments and biological substances status: Secondary | ICD-10-CM | POA: Insufficient documentation

## 2021-09-04 DIAGNOSIS — Z881 Allergy status to other antibiotic agents status: Secondary | ICD-10-CM | POA: Insufficient documentation

## 2021-09-04 DIAGNOSIS — Z91041 Radiographic dye allergy status: Secondary | ICD-10-CM | POA: Insufficient documentation

## 2021-09-04 DIAGNOSIS — K222 Esophageal obstruction: Secondary | ICD-10-CM

## 2021-09-04 DIAGNOSIS — R1310 Dysphagia, unspecified: Secondary | ICD-10-CM | POA: Diagnosis not present

## 2021-09-04 DIAGNOSIS — Z79899 Other long term (current) drug therapy: Secondary | ICD-10-CM | POA: Diagnosis not present

## 2021-09-04 DIAGNOSIS — K2100 Gastro-esophageal reflux disease with esophagitis, without bleeding: Secondary | ICD-10-CM

## 2021-09-04 HISTORY — PX: ESOPHAGOGASTRODUODENOSCOPY (EGD) WITH PROPOFOL: SHX5813

## 2021-09-04 HISTORY — PX: BIOPSY: SHX5522

## 2021-09-04 HISTORY — PX: ESOPHAGEAL DILATION: SHX303

## 2021-09-04 SURGERY — ESOPHAGOGASTRODUODENOSCOPY (EGD) WITH PROPOFOL
Anesthesia: Monitor Anesthesia Care

## 2021-09-04 MED ORDER — PROPOFOL 500 MG/50ML IV EMUL
INTRAVENOUS | Status: DC | PRN
  Administered 2021-09-04: 125 ug/kg/min via INTRAVENOUS

## 2021-09-04 MED ORDER — PROPOFOL 500 MG/50ML IV EMUL
INTRAVENOUS | Status: AC
  Filled 2021-09-04: qty 50

## 2021-09-04 MED ORDER — LACTATED RINGERS IV SOLN
INTRAVENOUS | Status: AC | PRN
  Administered 2021-09-04: 1000 mL via INTRAVENOUS

## 2021-09-04 MED ORDER — PROPOFOL 10 MG/ML IV BOLUS
INTRAVENOUS | Status: DC | PRN
  Administered 2021-09-04 (×4): 20 mg via INTRAVENOUS

## 2021-09-04 MED ORDER — LIDOCAINE 2% (20 MG/ML) 5 ML SYRINGE
INTRAMUSCULAR | Status: DC | PRN
  Administered 2021-09-04: 60 mg via INTRAVENOUS

## 2021-09-04 MED ORDER — SODIUM CHLORIDE 0.9 % IV SOLN: INTRAVENOUS | Status: DC

## 2021-09-04 SURGICAL SUPPLY — 14 items

## 2021-09-04 NOTE — Transfer of Care (Signed)
Immediate Anesthesia Transfer of Care Note  Patient: Emily Joseph  Procedure(s) Performed: ESOPHAGOGASTRODUODENOSCOPY (EGD) WITH PROPOFOL BIOPSY ESOPHAGEAL DILATION  Patient Location: PACU and Endoscopy Unit  Anesthesia Type:MAC  Level of Consciousness: awake, alert  and patient cooperative  Airway & Oxygen Therapy: Patient Spontanous Breathing and Patient connected to face mask oxygen  Post-op Assessment: Report given to RN and Post -op Vital signs reviewed and stable  Post vital signs: Reviewed and stable  Last Vitals:  Vitals Value Taken Time  BP    Temp    Pulse 77 09/04/21 1409  Resp 9 09/04/21 1409  SpO2 100 % 09/04/21 1409  Vitals shown include unvalidated device data.  Last Pain:  Vitals:   09/04/21 1408  TempSrc:   PainSc: 0-No pain         Complications: No notable events documented.

## 2021-09-04 NOTE — Anesthesia Preprocedure Evaluation (Addendum)
Anesthesia Evaluation  Patient identified by MRN, date of birth, ID band Patient awake    Reviewed: Allergy & Precautions, NPO status , Patient's Chart, lab work & pertinent test results  History of Anesthesia Complications Negative for: history of anesthetic complications  Airway Mallampati: I  TM Distance: >3 FB Neck ROM: Full    Dental  (+) Dental Advisory Given, Teeth Intact   Pulmonary asthma , COPD,  COPD inhaler,    breath sounds clear to auscultation       Cardiovascular hypertension, Pt. on medications and Pt. on home beta blockers (-) angina+ dysrhythmias (occ pvc's)  Rhythm:Regular Rate:Normal  '21 ECHO: EF 60-65%, normal LVF, no regional wall motion abnormalities, no significant valvular abnormalities  s/p minimally invasive resection of right atrial lipoma '17   Neuro/Psych Anxiety Chronic pain: narcotics    GI/Hepatic Neg liver ROS, GERD  Medicated and Controlled,  Endo/Other  negative endocrine ROS  Renal/GU negative Renal ROS     Musculoskeletal  (+) Arthritis , Fibromyalgia -  Abdominal   Peds  Hematology negative hematology ROS (+)   Anesthesia Other Findings   Reproductive/Obstetrics                            Anesthesia Physical Anesthesia Plan  ASA: 2  Anesthesia Plan: MAC   Post-op Pain Management:    Induction:   PONV Risk Score and Plan: 2  Airway Management Planned: Natural Airway and Nasal Cannula  Additional Equipment: None  Intra-op Plan:   Post-operative Plan:   Informed Consent: I have reviewed the patients History and Physical, chart, labs and discussed the procedure including the risks, benefits and alternatives for the proposed anesthesia with the patient or authorized representative who has indicated his/her understanding and acceptance.     Dental advisory given  Plan Discussed with: CRNA and Surgeon  Anesthesia Plan Comments:         Anesthesia Quick Evaluation
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
## 2021-09-04 NOTE — Op Note (Signed)
[facility] Patient Name: Emily Joseph Procedure Date: 09/04/2021 MRN: [id number] Attending MD: Gatha Mayer , MD Date of Birth: 05/07/1971 CSN: [id number] Age: 50 Admit Type: Outpatient Procedure:                Upper GI endoscopy Indications:              Dysphagia, Stricture of the esophagus, For therapy                            of esophageal stricture Providers:                Gatha Mayer, MD, [name], RN, Tyna Jaksch Technician Referring MD:              Medicines:                Propofol per Anesthesia, Monitored Anesthesia Care Complications:            No immediate complications. Estimated Blood Loss:     Estimated blood loss was minimal. Procedure:                Pre-Anesthesia Assessment:                           - Prior to the procedure, a History and Physical                            was performed, and patient medications and                            allergies were reviewed. The patient's tolerance of                            previous anesthesia was also reviewed. The risks                            and benefits of the procedure and the sedation                            options and risks were discussed with the patient.                            All questions were answered, and informed consent                            was obtained. Prior Anticoagulants: The patient has                            taken no previous anticoagulant or antiplatelet                            agents. ASA Grade Assessment: II - A patient with  mild systemic disease. After reviewing the risks                            and benefits, the patient was deemed in                            satisfactory condition to undergo the procedure.                           After obtaining informed consent, the endoscope was                            passed under direct vision. Throughout the                             procedure, the patient's blood pressure, pulse, and                            oxygen saturations were monitored continuously. The                            GIF-H190 ([id number]) Olympus endoscope was introduced                            through the mouth, and advanced to the second part                            of duodenum. The upper GI endoscopy was                            accomplished without difficulty. The patient                            tolerated the procedure well. Scope In: Scope Out: Findings:      A web was found in the distal esophagus. A TTS dilator was passed       through the scope. Dilation with a 15-16.5-18 mm balloon dilator was       performed to 18 mm. The dilation site was examined and showed moderate       mucosal disruption. Estimated blood loss was minimal.      The exam was otherwise without abnormality.      The cardia and gastric fundus were normal on retroflexion.      Biopsies were taken with a cold forceps in the proximal esophagus and in       the distal esophagus for histology. Impression:               - Web in the distal esophagus. Dilated. 18 mm.                           - The examination was otherwise normal.                           - Biopsies were taken with a cold forceps for  histology in the proximal esophagus and in the                            distal esophagus. She has hx eosinophilic                            esophagitis but typical features not seen on this                            exam. biopsies of the web and distal/proximal                            esophagus were taken to see if there is persistent                            evidence of EoE Moderate Sedation:      Not Applicable - Patient had care per Anesthesia. Recommendation:           - Await pathology results.                           - Patient has a contact number available for                            emergencies. The signs and  symptoms of potential                            delayed complications were discussed with the                            patient. Return to normal activities tomorrow.                            Written discharge instructions were provided to the                            patient.                           - Clear liquids x 1 hour then soft foods rest of                            day. Start prior diet tomorrow.                           - Continue present medications. Procedure Code(s):        --- Professional ---                           [phone], Esophagogastroduodenoscopy, flexible,                            transoral; with transendoscopic balloon dilation of  esophagus (less than 30 mm diameter)                           43239, 59, Esophagogastroduodenoscopy, flexible,                            transoral; with biopsy, single or multiple Diagnosis Code(s):        --- Professional ---                           Q39.4, Esophageal web                           R13.10, Dysphagia, unspecified                           K22.2, Esophageal obstruction CPT copyright 2019 American Medical Association. All rights reserved. The codes documented in this report are preliminary and upon coder review may  be revised to meet current compliance requirements. Gatha Mayer, MD 09/04/2021 2:17:00 PM This report has been signed electronically. Number of Addenda: 0

## 2021-09-04 NOTE — Anesthesia Procedure Notes (Signed)
Procedure Name: MAC Date/Time: 09/04/2021 1:48 PM Performed by: Lollie Sails, CRNA Pre-anesthesia Checklist: Patient identified, Emergency Drugs available, Suction available, Patient being monitored and Timeout performed Oxygen Delivery Method: Simple face mask Preoxygenation: POM used. Placement Confirmation: positive ETCO2

## 2021-09-04 NOTE — Discharge Instructions (Signed)
I dilated the esophagus and took biopsies.  Please follow the diet instructions below but I am hopeful you will be able to eat normally.  Once biopsy results are in my office will contact you and we will arrange a follow-up. I took biopsies to see if the eosinophilic esophagitis is still active.  I appreciate the opportunity to care for you. Gatha Mayer, MD, FACG  YOU HAD AN ENDOSCOPIC PROCEDURE TODAY: Refer to the procedure report and other information in the discharge instructions given to you for any specific questions about what was found during the examination. If this information does not answer your questions, please call Dr. Celesta Aver office at [phone] to clarify.   YOU SHOULD EXPECT: Some feelings of bloating in the abdomen. Passage of more gas than usual. Walking can help get rid of the air that was put into your GI tract during the procedure and reduce the bloating. If you had a lower endoscopy (such as a colonoscopy or flexible sigmoidoscopy) you may notice spotting of blood in your stool or on the toilet paper. Some abdominal soreness may be present for a day or two, also.  DIET:   Clear liquids until 3PM then try soft foods then normal consistency foods tomorrow.    ACTIVITY: Your care partner should take you home directly after the procedure. You should plan to take it easy, moving slowly for the rest of the day. You can resume normal activity the day after the procedure however YOU SHOULD NOT DRIVE, use power tools, machinery or perform tasks that involve climbing or major physical exertion for 24 hours (because of the sedation medicines used during the test).   SYMPTOMS TO REPORT IMMEDIATELY: A gastroenterologist can be reached at any hour. Please call [phone]  for any of the following symptoms:   Following upper endoscopy (EGD, EUS, ERCP, esophageal dilation) Vomiting of blood or coffee ground material  New, significant abdominal pain  New, significant chest  pain or pain under the shoulder blades  Painful or persistently difficult swallowing  New shortness of breath  Black, tarry-looking or red, bloody stools

## 2021-09-05 LAB — SURGICAL PATHOLOGY

## 2021-09-05 NOTE — Anesthesia Postprocedure Evaluation (Signed)
Anesthesia Post Note  Patient: Emily Joseph  Procedure(s) Performed: ESOPHAGOGASTRODUODENOSCOPY (EGD) WITH PROPOFOL BIOPSY ESOPHAGEAL DILATION     Patient location during evaluation: Endoscopy Anesthesia Type: MAC Level of consciousness: awake and alert Pain management: pain level controlled Vital Signs Assessment: post-procedure vital signs reviewed and stable Respiratory status: spontaneous breathing, nonlabored ventilation, respiratory function stable and patient connected to nasal cannula oxygen Cardiovascular status: stable and blood pressure returned to baseline Postop Assessment: no apparent nausea or vomiting Anesthetic complications: no   No notable events documented.  Last Vitals:  Vitals:   09/04/21 1430 09/04/21 1440  BP: 125/71 129/67  Pulse: 85 75  Resp: 13 (!) 21  Temp:    SpO2: 100% 97%    Last Pain:  Vitals:   09/04/21 1440  TempSrc:   PainSc: 0-No pain                 Belenda Cruise P Billi Bright

## 2021-09-06 NOTE — Progress Notes (Deleted)
HPI: FU atrial lipoma. Patient was seen previously for dyspnea and echocardiogram showed right atrial mass. Coronary CTA August 2017 showed a calcium score of 0 and no coronary artery disease. There was a large right atrial mass. Patient subsequently had resection of right atrial lipoma and repair of patent foramen ovale. Patient was admitted in December 2017 with new onset atrial flutter. She converted to sinus spontaneously. TSH was normal. She was not anticoagulated as her only embolic risk factor was female sex ([place] 1).  Monitor November 2018 showed sinus to sinus tachycardia with occasional PACs, PVCs and brief PAT.  Last echocardiogram 11/21 showed normal LV function and mild tricuspid regurgitation. Since last seen,   Current Outpatient Medications  Medication Sig Dispense Refill   albuterol (VENTOLIN HFA) 108 (90 Base) MCG/ACT inhaler Inhale 2 puffs into the lungs as needed for wheezing or shortness of breath.     Cholecalciferol (VITAMIN D3) 125 MCG (5000 UT) TABS Take 5,000 Units by mouth daily.     EPINEPHrine 0.3 mg/0.3 mL IJ SOAJ injection Inject 0.3 mg into the muscle as needed for anaphylaxis.      fexofenadine (ALLEGRA ODT) 30 MG disintegrating tablet Take 30 mg by mouth daily.     Glycerin-Hypromellose-PEG 400 (DRY EYE RELIEF DROPS) 0.2-0.2-1 % SOLN Place 1 drop into both eyes daily.     lansoprazole (PREVACID) 15 MG capsule Take 15 mg by mouth 2 (two) times daily before a meal.     Methylcobalamin (METHYL B-12 PO) Take by mouth as needed.     metoprolol tartrate (LOPRESSOR) 25 MG tablet TAKE 1/2 TABLET(12.5 MG) BY MOUTH TWICE DAILY (Patient taking differently: as needed. TAKE 1/2 TABLET(12.5 MG) BY MOUTH TWICE DAILY) 90 tablet 3   Polyethyl Glycol-Propyl Glycol (SYSTANE OP) Place 1 drop into both eyes 2 (two) times daily.     polyethylene glycol (MIRALAX / GLYCOLAX) 17 g packet Take 17 g by mouth daily.     Probiotic Product (PROBIOTIC ADVANCED PO) Take 1 tablet by  mouth 2 (two) times daily. Garden of life Children chewable     triamcinolone (NASACORT) 55 MCG/ACT AERO nasal inhaler Place 2 sprays into the nose daily.     No current facility-administered medications for this visit.     Past Medical History:  Diagnosis Date   Adenomyosis    not papanicolaou smear of cervix and cervical HPV   Allergy    Anxiety 06/08/2016   -about her health and about taking medications   Arthritis    ?   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 Trudie Reed in 2017, [place] rheum -seeing Dr. Jaynee Eagles, Neurologist for back pain with radicular symptoms   Complication of anesthesia    "with epidural bp bottoms out"   Dysrhythmia    fast hr   Eosinophilic esophagitis    Sees Dr. Carlean Purl   Fibromyalgia    GERD (gastroesophageal reflux disease)    Heart murmur    History of atrial flutter 10/2016   spontaneous conversion to sinus   History of gallstones    History of pneumonia    when pt was pregnant   History of pneumothorax 07/2016   Right   History of sinus tachycardia    Iron  deficiency anemia due to chronic blood loss    Menorrhagia, s/p complete hysterectomy, ovaries remain hx   Irritable bowel syndrome 01/27/2013   Dr Carlean Purl 02/2016    Microhematuria    Mouth problem    failed gum graft 1/16   Nasal airway abnormality    Nasal obstruction 06/26/2015   Seen by ENT 06-26-15.  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   Shortness of breath dyspnea    SUI (stress urinary incontinence, female)    Syncope    with last child with epideral   UTI (urinary tract infection) 07/14/2016    >=100,000 COLONIES/mL KLEBSIELLA PNEUMONIA   Vitamin D deficiency     Past Surgical History:  Procedure Laterality Date   APPENDECTOMY  01/1989   BALLOON DILATION N/A 02/09/2013   Procedure: BALLOON DILATION;  Surgeon: Arta Silence, MD;  Location: [facility];  Service: Endoscopy;  Laterality: N/A;   BILATERAL SALPINGECTOMY N/A 06/14/2014   Procedure: BILATERAL SALPINGECTOMY;  Surgeon: Allena Katz, MD;  Location: [facility] [facility];  Service: Gynecology;  Laterality: N/A;   BIOPSY  09/04/2021   Procedure: BIOPSY;  Surgeon: Gatha Mayer, MD;  [facility]: [facility];  Service: Endoscopy;;   BREAST BIOPSY Right    biopsy    CHOLECYSTECTOMY N/A 11/21/2019   Procedure: LAPAROSCOPIC CHOLECYSTECTOMY WITH INTRAOPERATIVE CHOLANGIOGRAM;  Surgeon: Alphonsa Overall, MD;  Location: [facility];  Service: General;  Laterality: N/A;   COLONOSCOPY WITH PROPOFOL N/A 02/09/2013   Procedure: COLONOSCOPY WITH PROPOFOL;  Surgeon: Arta Silence, MD;  [facility];  Service: Endoscopy;  Laterality: N/A;  need ultra thin colon scope   ESOPHAGEAL DILATION  09/04/2021   Procedure: ESOPHAGEAL DILATION;  Surgeon: Gatha Mayer, MD;  [facility];  Service: Endoscopy;;   ESOPHAGOGASTRODUODENOSCOPY Left 06/30/2021   Procedure: ESOPHAGOGASTRODUODENOSCOPY (EGD);  Surgeon: Lavena Bullion, DO;  [facility];  Service: Gastroenterology;  Laterality: Left;   ESOPHAGOGASTRODUODENOSCOPY (EGD) WITH PROPOFOL N/A 02/09/2013   Procedure: ESOPHAGOGASTRODUODENOSCOPY (EGD) WITH PROPOFOL;  Surgeon: Arta Silence, MD;  [facility];  Service: Endoscopy;  Laterality: N/A;   ESOPHAGOGASTRODUODENOSCOPY (EGD) WITH PROPOFOL N/A 09/04/2021   Procedure: ESOPHAGOGASTRODUODENOSCOPY (EGD) WITH PROPOFOL;  Surgeon: Gatha Mayer, MD;  [facility];  Service: Endoscopy;  Laterality: N/A;   FOREIGN BODY REMOVAL  06/30/2021   Procedure: FOREIGN BODY REMOVAL;  Surgeon: Lavena Bullion, DO;  Location: [place]  [facility];  Service: Gastroenterology;;   LAPAROSCOPIC ASSISTED VAGINAL HYSTERECTOMY Right 06/14/2014   Procedure: OPEN LAPAROSCOPIC ASSISTED VAGINAL HYSTERECTOMY;  Surgeon: Allena Katz, MD;  Location: [place] [facility];  Service: Gynecology;  Laterality: Right;   LYSIS OF ADHESION N/A 06/14/2014   Procedure: LYSIS OF ADHESION;  Surgeon: Allena Katz, MD;  Location: [place] [facility];  Service: Gynecology;  Laterality: N/A;   MINIMALLY INVASIVE EXCISION OF ATRIAL MYXOMA Right 07/11/2016   Procedure: MINIMALLY INVASIVE RESECTION OF RIGHT ATRIAL LIPOMA WITH CLOSURE OF PATENT FORAMEN OVALE;  Surgeon: Rexene Alberts, MD;  Location: [place];  Service: Open Heart Surgery;  Laterality: Right;   OVARIAN CYST REMOVAL Right 1990   TEE WITHOUT CARDIOVERSION N/A 07/11/2016   Procedure: TRANSESOPHAGEAL ECHOCARDIOGRAM (TEE);  Surgeon: Rexene Alberts, MD;  Location: [place];  Service: Open Heart Surgery;  Laterality: N/A;  Social History   Socioeconomic History   Marital status: Married    Spouse name: [name]    Number of children: 4   Years of education: 12+   Highest education level: Not on file  Occupational History   Not on file  Tobacco Use   Smoking status: Never   Smokeless tobacco: Never  Vaping Use   Vaping Use: Never used  Substance and Sexual Activity   Alcohol use: Yes    Alcohol/week: 0.0 standard drinks    Comment: 3 glasses wine per week or less    Drug use: No   Sexual activity: Yes    Partners: Male    Birth control/protection: Surgical  Other Topics Concern   Not on file  Social History Narrative   Social History:   Now stays at home -  feels can barely function just to keep house and take care of  4 children. Husband travels and works a lot.   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      [name] - husband; [phone] -daughter; [name]- son [name] 2003 daughter [name] 2008 daughter    Healthy  diet - lots of food allergies. Avoiding gluten currently.      Of note, at age 2 she had a very traumatic medical experience. She apparently was in the hospital for some time and had many needlesticks, many CT scans and surgery for an ovarian mass. This was very traumatizing for her. She still gets anxiety when she is going to see a doctor or health care provider. She had a flashback to this time when she went for acupuncture.        -Wears a bicycle helmet riding a bike: Yes     -smoke alarm in the home:Yes     - wears seatbelt: Yes     - Feels safe in their relationships: Yes   Social Determinants of Health   Financial Resource Strain: Not on file  Food Insecurity: Not on file  Transportation Needs: Not on file  Physical Activity: Not on file  Stress: Not on file  Social Connections: Not on file  Intimate Partner Violence: Not on file    Family History  Problem Relation Age of Onset   Cancer Mother    Breast cancer Mother 40   Lung cancer Mother    CAD Father        MI at age 6   Stroke Father    Cancer Father        Cholangiocarcinoma   Goiter Maternal Grandmother    Cancer Maternal Grandfather    Lung cancer Maternal Grandfather    Rheum arthritis Paternal Grandmother    Stroke Paternal Grandfather    Kidney disease Paternal Grandfather    Alport syndrome Paternal Grandfather    Esophageal cancer Paternal Uncle    Polycystic ovary syndrome Daughter        pre-diabetic    Healthy Son    Allergies Daughter    Healthy Daughter    Myasthenia gravis Sister    Chiari malformation Sister    Hematuria Sister     ROS: no fevers or chills, productive cough, hemoptysis, dysphasia, odynophagia, melena, hematochezia, dysuria, hematuria, rash, seizure activity, orthopnea, PND, pedal edema, claudication. Remaining systems are negative.  Physical Exam: Well-developed well-nourished in no acute distress.  Skin is warm and dry.  HEENT is normal.  Neck is supple.  Chest is  clear to auscultation with normal expansion.  Cardiovascular exam is regular rate and rhythm.  Abdominal exam nontender or distended. No masses palpated. Extremities show no edema. neuro grossly intact  ECG- personally reviewed  A/P  1 right atrial lipoma-history of resection.  Plan repeat echocardiogram November 2023.  2 palpitations-continue beta-blocker as needed.  Previous running dose caused fatigue.  3 history of atrial flutter-patient is in sinus rhythm today.  CHA2DS2-VASc 1 for female sex.  She is therefore not anticoagulated.  Kirk Ruths, MD

## 2021-09-16 ENCOUNTER — Ambulatory Visit: Payer: BC Managed Care – PPO | Admitting: Cardiology

## 2021-10-02 NOTE — Progress Notes (Signed)
HPI: FU atrial lipoma. Patient was seen previously for dyspnea and echocardiogram showed right atrial mass. Coronary CTA August 2017 showed a calcium score of 0 and no coronary artery disease. There was a large right atrial mass. Patient subsequently had resection of right atrial lipoma and repair of patent foramen ovale. Patient was admitted in December 2017 with new onset atrial flutter. She converted to sinus spontaneously. TSH was normal. She was not anticoagulated as her only embolic risk factor was female sex ([place] 1).  Monitor November 2018 showed sinus to sinus tachycardia with occasional PACs, PVCs and brief PAT.  Last echocardiogram 11/21 showed normal LV function and mild tricuspid regurgitation. Since last seen, there is no increased dyspnea, chest pain or syncope.  Occasional palpitations that are reasonly well controlled with metoprolol as needed.  Current Outpatient Medications  Medication Sig Dispense Refill   albuterol (VENTOLIN HFA) 108 (90 Base) MCG/ACT inhaler Inhale 2 puffs into the lungs as needed for wheezing or shortness of breath.     Azelastine-Fluticasone 137-50 MCG/ACT SUSP Place 1 spray into both nostrils 2 (two) times daily.     Cholecalciferol (VITAMIN D3) 125 MCG (5000 UT) TABS Take 5,000 Units by mouth daily.     EPINEPHrine 0.3 mg/0.3 mL IJ SOAJ injection Inject 0.3 mg into the muscle as needed for anaphylaxis.      fexofenadine (ALLEGRA ODT) 30 MG disintegrating tablet Take 30 mg by mouth daily.     Glycerin-Hypromellose-PEG 400 (DRY EYE RELIEF DROPS) 0.2-0.2-1 % SOLN Place 1 drop into both eyes daily.     lansoprazole (PREVACID) 15 MG capsule Take 15 mg by mouth 2 (two) times daily before a meal.     Methylcobalamin (METHYL B-12 PO) Take by mouth as needed.     metoprolol tartrate (LOPRESSOR) 25 MG tablet TAKE 1/2 TABLET(12.5 MG) BY MOUTH TWICE DAILY (Patient taking differently: as needed. TAKE 1/2 TABLET(12.5 MG) BY MOUTH TWICE DAILY) 90 tablet 3    Polyethyl Glycol-Propyl Glycol (SYSTANE OP) Place 1 drop into both eyes 2 (two) times daily.     polyethylene glycol (MIRALAX / GLYCOLAX) 17 g packet Take 17 g by mouth daily.     Probiotic Product (PROBIOTIC ADVANCED PO) Take 1 tablet by mouth 2 (two) times daily. Garden of life Children chewable     triamcinolone (NASACORT) 55 MCG/ACT AERO nasal inhaler Place 2 sprays into the nose daily. (Patient not taking: Reported on 10/14/2021)     No current facility-administered medications for this visit.     Past Medical History:  Diagnosis Date   Adenomyosis    not papanicolaou smear of cervix and cervical HPV   Allergy    Anxiety 06/08/2016   -about her health and about taking medications   Arthritis    ?   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 Trudie Reed in 2017, [place] rheum -seeing Dr. Jaynee Eagles, Neurologist for back pain with radicular symptoms   Complication of anesthesia    "with epidural bp bottoms out"   Dysrhythmia    fast hr   Eosinophilic esophagitis    Sees Dr. Carlean Purl   Fibromyalgia    GERD (gastroesophageal reflux disease)    Heart murmur    History of atrial flutter  10/2016   spontaneous conversion to sinus   History of gallstones    History of pneumonia    when pt was pregnant   History of pneumothorax 07/2016   Right   History of sinus tachycardia    Iron deficiency anemia due to chronic blood loss    Menorrhagia, s/p complete hysterectomy, ovaries remain hx   Irritable bowel syndrome 01/27/2013   Dr Carlean Purl 02/2016    Microhematuria    Mouth problem    failed gum graft 1/16   Nasal airway abnormality    Nasal obstruction 06/26/2015   Seen by ENT 06-26-15.  May need sleep study to see if having apnea     Palpitations    Pelvic floor dysfunction in  female    s/p minimally invasive resection of right atrial lipoma    4 cm mass in right atrium c/w benign cardiac lipoma   Shortness of breath dyspnea    SUI (stress urinary incontinence, female)    Syncope    with last child with epideral   UTI (urinary tract infection) 07/14/2016   >=100,000 COLONIES/mL KLEBSIELLA PNEUMONIA   Vitamin D deficiency     Past Surgical History:  Procedure Laterality Date   APPENDECTOMY  01/1989   BALLOON DILATION N/A 02/09/2013   Procedure: BALLOON DILATION;  Surgeon: Arta Silence, MD;  [facility]: [facility];  Service: Endoscopy;  Laterality: N/A;   BILATERAL SALPINGECTOMY N/A 06/14/2014   Procedure: BILATERAL SALPINGECTOMY;  Surgeon: Allena Katz, MD;  Location: [place] [facility];  Service: Gynecology;  Laterality: N/A;   BIOPSY  09/04/2021   Procedure: BIOPSY;  Surgeon: Gatha Mayer, MD;  [facility];  Service: Endoscopy;;   BREAST BIOPSY Right    biopsy    CHOLECYSTECTOMY N/A 11/21/2019   Procedure: LAPAROSCOPIC CHOLECYSTECTOMY WITH INTRAOPERATIVE CHOLANGIOGRAM;  Surgeon: Alphonsa Overall, MD;  [facility]: [facility];  Service: General;  Laterality: N/A;   COLONOSCOPY WITH PROPOFOL N/A 02/09/2013   Procedure: COLONOSCOPY WITH PROPOFOL;  Surgeon: Arta Silence, MD;  [facility];  Service: Endoscopy;  Laterality: N/A;  need ultra thin colon scope   ESOPHAGEAL DILATION  09/04/2021   Procedure: ESOPHAGEAL DILATION;  Surgeon: Gatha Mayer, MD;  [facility];  Service: Endoscopy;;   ESOPHAGOGASTRODUODENOSCOPY Left 06/30/2021   Procedure: ESOPHAGOGASTRODUODENOSCOPY (EGD);  Surgeon: Lavena Bullion, DO;  Location: [facility];  Service: Gastroenterology;  Laterality: Left;   ESOPHAGOGASTRODUODENOSCOPY (EGD) WITH PROPOFOL N/A 02/09/2013   Procedure: ESOPHAGOGASTRODUODENOSCOPY (EGD) WITH PROPOFOL;  Surgeon: Arta Silence, MD;  [facility];  Service: Endoscopy;  Laterality: N/A;   ESOPHAGOGASTRODUODENOSCOPY (EGD) WITH PROPOFOL  N/A 09/04/2021   Procedure: ESOPHAGOGASTRODUODENOSCOPY (EGD) WITH PROPOFOL;  Surgeon: Gatha Mayer, MD;  [facility];  Service: Endoscopy;  Laterality: N/A;   FOREIGN BODY REMOVAL  06/30/2021   Procedure: FOREIGN BODY REMOVAL;  Surgeon: Lavena Bullion, DO;  Location: [place] [facility];  Service: Gastroenterology;;   LAPAROSCOPIC ASSISTED VAGINAL HYSTERECTOMY Right 06/14/2014   Procedure: OPEN LAPAROSCOPIC ASSISTED VAGINAL HYSTERECTOMY;  Surgeon: Allena Katz, MD;  Location: [place] [facility];  Service: Gynecology;  Laterality: Right;   LYSIS OF ADHESION N/A 06/14/2014   Procedure: LYSIS OF ADHESION;  Surgeon: Allena Katz, MD;  Location: [place] [facility];  Service: Gynecology;  Laterality: N/A;   MINIMALLY INVASIVE EXCISION OF ATRIAL MYXOMA Right 07/11/2016   Procedure: MINIMALLY INVASIVE RESECTION OF RIGHT ATRIAL LIPOMA WITH CLOSURE OF PATENT FORAMEN OVALE;  Surgeon: Rexene Alberts, MD;  Location: [place];  Service:  Open Heart Surgery;  Laterality: Right;   OVARIAN CYST REMOVAL Right 1990   TEE WITHOUT CARDIOVERSION N/A 07/11/2016   Procedure: TRANSESOPHAGEAL ECHOCARDIOGRAM (TEE);  Surgeon: Rexene Alberts, MD;  Location: [place];  Service: Open Heart Surgery;  Laterality: N/A;    Social History   Socioeconomic History   Marital status: Married    Spouse name: [name]    Number of children: 4   Years of education: 12+   Highest education level: Not on file  Occupational History   Not on file  Tobacco Use   Smoking status: Never   Smokeless tobacco: Never  Vaping Use   Vaping Use: Never used  Substance and Sexual Activity   Alcohol use: Yes    Alcohol/week: 0.0 standard drinks    Comment: 3 glasses wine per week or less    Drug use: No   Sexual activity: Yes    Partners: Male    Birth control/protection: Surgical  Other Topics Concern   Not on file  Social History Narrative   Social History:   Now stays at home -  feels can barely function just to keep house and take care of  4  children. Husband travels and works a lot.   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      [name] - husband; [phone] -daughter; [name]- son [name] 2003 daughter [name] 2008 daughter    Healthy diet - lots of food allergies. Avoiding gluten currently.      Of note, at age 56 she had a very traumatic medical experience. She apparently was in the hospital for some time and had many needlesticks, many CT scans and surgery for an ovarian mass. This was very traumatizing for her. She still gets anxiety when she is going to see a doctor or health care provider. She had a flashback to this time when she went for acupuncture.        -Wears a bicycle helmet riding a bike: Yes     -smoke alarm in the home:Yes     - wears seatbelt: Yes     - Feels safe in their relationships: Yes   Social Determinants of Health   Financial Resource Strain: Not on file  Food Insecurity: Not on file  Transportation Needs: Not on file  Physical Activity: Not on file  Stress: Not on file  Social Connections: Not on file  Intimate Partner Violence: Not on file    Family History  Problem Relation Age of Onset   Cancer Mother    Breast cancer Mother 60   Lung cancer Mother    CAD Father        MI at age 58   Stroke Father    Cancer Father        Cholangiocarcinoma   Goiter Maternal Grandmother    Cancer Maternal Grandfather    Lung cancer Maternal Grandfather    Rheum arthritis Paternal Grandmother    Stroke Paternal Grandfather    Kidney disease Paternal Grandfather    Alport syndrome Paternal Grandfather    Esophageal cancer Paternal Uncle    Polycystic ovary syndrome Daughter        pre-diabetic    Healthy Son    Allergies Daughter    Healthy Daughter    Myasthenia gravis Sister    Chiari malformation Sister    Hematuria Sister     ROS: no fevers  or chills, productive cough, hemoptysis, dysphasia,  odynophagia, melena, hematochezia, dysuria, hematuria, rash, seizure activity, orthopnea, PND, pedal edema, claudication. Remaining systems are negative.  Physical Exam: Well-developed well-nourished in no acute distress.  Skin is warm and dry.  HEENT is normal.  Neck is supple.  Chest is clear to auscultation with normal expansion.  Cardiovascular exam is regular rate and rhythm.  Abdominal exam nontender or distended. No masses palpated. Extremities show no edema. neuro grossly intact  ECG-normal sinus rhythm at a rate of 78, no ST changes.  Personally reviewed  A/P  1 right atrial lipoma-history of resection.  Plan repeat echocardiogram November 2023.  2 palpitations-continue beta-blocker as needed.  Previous running dose caused fatigue.  3 history of atrial flutter-patient is in sinus rhythm today.  CHA2DS2-VASc 1 for female sex.  She is therefore not anticoagulated.  Kirk Ruths, MD

## 2021-10-14 ENCOUNTER — Encounter: Payer: Self-pay | Admitting: Cardiology

## 2021-10-14 ENCOUNTER — Ambulatory Visit (INDEPENDENT_AMBULATORY_CARE_PROVIDER_SITE_OTHER): Payer: BC Managed Care – PPO | Admitting: Cardiology

## 2021-10-14 ENCOUNTER — Other Ambulatory Visit: Payer: Self-pay

## 2021-10-14 VITALS — BP 120/77 | HR 78 | Ht 67.0 in | Wt 163.0 lb

## 2021-10-14 DIAGNOSIS — I4892 Unspecified atrial flutter: Secondary | ICD-10-CM

## 2021-10-14 DIAGNOSIS — R002 Palpitations: Secondary | ICD-10-CM

## 2021-10-14 DIAGNOSIS — I5189 Other ill-defined heart diseases: Secondary | ICD-10-CM | POA: Diagnosis not present

## 2021-10-14 MED ORDER — METOPROLOL TARTRATE 25 MG PO TABS: 12.5000 mg | ORAL_TABLET | Freq: Two times a day (BID) | ORAL | 3 refills | Status: DC

## 2021-10-14 NOTE — Patient Instructions (Signed)

## 2021-10-17 ENCOUNTER — Encounter: Payer: Self-pay | Admitting: Family Medicine

## 2021-10-17 ENCOUNTER — Ambulatory Visit (INDEPENDENT_AMBULATORY_CARE_PROVIDER_SITE_OTHER): Payer: BC Managed Care – PPO | Admitting: Family Medicine

## 2021-10-17 ENCOUNTER — Other Ambulatory Visit: Payer: Self-pay

## 2021-10-17 VITALS — BP 99/65 | HR 89 | Temp 98.4°F | Ht 67.0 in | Wt 161.0 lb

## 2021-10-17 DIAGNOSIS — Z13220 Encounter for screening for lipoid disorders: Secondary | ICD-10-CM

## 2021-10-17 DIAGNOSIS — R790 Abnormal level of blood mineral: Secondary | ICD-10-CM | POA: Diagnosis not present

## 2021-10-17 DIAGNOSIS — R5383 Other fatigue: Secondary | ICD-10-CM | POA: Diagnosis not present

## 2021-10-17 DIAGNOSIS — Z0001 Encounter for general adult medical examination with abnormal findings: Secondary | ICD-10-CM

## 2021-10-17 DIAGNOSIS — Z131 Encounter for screening for diabetes mellitus: Secondary | ICD-10-CM

## 2021-10-17 DIAGNOSIS — Z1159 Encounter for screening for other viral diseases: Secondary | ICD-10-CM

## 2021-10-17 LAB — COMPREHENSIVE METABOLIC PANEL
ALT: 23 U/L (ref 0–35)
AST: 16 U/L (ref 0–37)
Albumin: 4.3 g/dL (ref 3.5–5.2)
Alkaline Phosphatase: 68 U/L (ref 39–117)
BUN: 16 mg/dL (ref 6–23)
CO2: 27 mEq/L (ref 19–32)
Calcium: 9.5 mg/dL (ref 8.4–10.5)
Chloride: 103 mEq/L (ref 96–112)
Creatinine, Ser: 0.67 mg/dL (ref 0.40–1.20)
GFR: 101.92 mL/min (ref >60.00)
Glucose, Bld: 103 mg/dL — ABNORMAL HIGH (ref 70–99)
Potassium: 4 mEq/L (ref 3.5–5.1)
Sodium: 137 mEq/L (ref 135–145)
Total Bilirubin: 0.7 mg/dL (ref 0.2–1.2)
Total Protein: 6.8 g/dL (ref 6.0–8.3)

## 2021-10-17 LAB — LIPID PANEL
Cholesterol: 163 mg/dL (ref 0–200)
HDL: 42 mg/dL (ref >39.00)
LDL Cholesterol: 91 mg/dL (ref 0–99)
NonHDL: 120.91
Total CHOL/HDL Ratio: 4
Triglycerides: 151 mg/dL — ABNORMAL HIGH (ref 0.0–149.0)
VLDL: 30.2 mg/dL (ref 0.0–40.0)

## 2021-10-17 LAB — CBC
HCT: 39.8 % (ref 36.0–46.0)
Hemoglobin: 13.4 g/dL (ref 12.0–15.0)
MCHC: 33.7 g/dL (ref 30.0–36.0)
MCV: 87.8 fl (ref 78.0–100.0)
Platelets: 245 10*3/uL (ref 150.0–400.0)
RBC: 4.53 Mil/uL (ref 3.87–5.11)
RDW: 12.7 % (ref 11.5–15.5)
WBC: 7.6 10*3/uL (ref 4.0–10.5)

## 2021-10-17 LAB — HEMOGLOBIN A1C: Hgb A1c MFr Bld: 5.5 % (ref 4.6–6.5)

## 2021-10-17 LAB — TSH: TSH: 1.26 u[IU]/mL (ref 0.35–5.50)

## 2021-10-17 NOTE — Patient Instructions (Signed)
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
## 2021-10-18 LAB — IRON,TIBC AND FERRITIN PANEL
%SAT: 44 % (calc) (ref 16–45)
Ferritin: 77 ng/mL (ref 16–232)
Iron: 133 ug/dL (ref 45–160)
TIBC: 300 mcg/dL (calc) (ref 250–450)

## 2021-10-18 LAB — VITAMIN B12: Vitamin B-12: 648 pg/mL (ref 211–911)

## 2021-10-18 LAB — VITAMIN D 25 HYDROXY (VIT D DEFICIENCY, FRACTURES): VITD: 32.88 ng/mL (ref 30.00–100.00)

## 2021-10-24 LAB — HM MAMMOGRAPHY

## 2021-10-24 LAB — HM PAP SMEAR
HM Pap smear: NORMAL
Pap: NEGATIVE

## 2021-10-29 ENCOUNTER — Telehealth: Payer: Self-pay

## 2021-10-29 DIAGNOSIS — Z1159 Encounter for screening for other viral diseases: Secondary | ICD-10-CM

## 2021-10-29 NOTE — Telephone Encounter (Signed)
Patient calling in regards to Hep C test results.  She said that the results are still not showing in her patient patrol, test was done on 10/17/21.   Please call Aylla at [phone]

## 2021-10-29 NOTE — Telephone Encounter (Signed)
Pt advised error on our end for not collecting with labs during 12/8 appt. She was okay to come back in at later date for recollection since she will be receiving tdap vaccine. Appt schedule for 12/27

## 2021-11-05 ENCOUNTER — Ambulatory Visit (INDEPENDENT_AMBULATORY_CARE_PROVIDER_SITE_OTHER): Payer: BC Managed Care – PPO

## 2021-11-05 ENCOUNTER — Other Ambulatory Visit: Payer: Self-pay

## 2021-11-05 DIAGNOSIS — Z23 Encounter for immunization: Secondary | ICD-10-CM

## 2021-11-05 DIAGNOSIS — Z1159 Encounter for screening for other viral diseases: Secondary | ICD-10-CM

## 2021-11-05 NOTE — Progress Notes (Signed)
Pt received Tdap and labs today with no complications.

## 2021-11-06 LAB — HEPATITIS C ANTIBODY
Hepatitis C Ab: NONREACTIVE
SIGNAL TO CUT-OFF: 0.13 (ref <1.00)
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
## 2021-11-29 ENCOUNTER — Encounter: Payer: Self-pay | Admitting: Physician Assistant

## 2021-11-29 ENCOUNTER — Ambulatory Visit (INDEPENDENT_AMBULATORY_CARE_PROVIDER_SITE_OTHER): Payer: BC Managed Care – PPO | Admitting: Physician Assistant

## 2021-11-29 ENCOUNTER — Other Ambulatory Visit: Payer: Self-pay

## 2021-11-29 VITALS — BP 108/78 | HR 79 | Resp 12 | Ht 67.0 in | Wt 167.2 lb

## 2021-11-29 DIAGNOSIS — I5189 Other ill-defined heart diseases: Secondary | ICD-10-CM

## 2021-11-29 DIAGNOSIS — R7689 Other specified abnormal immunological findings in serum: Secondary | ICD-10-CM

## 2021-11-29 DIAGNOSIS — Z9109 Other allergy status, other than to drugs and biological substances: Secondary | ICD-10-CM

## 2021-11-29 DIAGNOSIS — K581 Irritable bowel syndrome with constipation: Secondary | ICD-10-CM

## 2021-11-29 DIAGNOSIS — R5382 Chronic fatigue, unspecified: Secondary | ICD-10-CM | POA: Diagnosis not present

## 2021-11-29 DIAGNOSIS — M79642 Pain in left hand: Secondary | ICD-10-CM

## 2021-11-29 DIAGNOSIS — K2100 Gastro-esophageal reflux disease with esophagitis, without bleeding: Secondary | ICD-10-CM

## 2021-11-29 DIAGNOSIS — R768 Other specified abnormal immunological findings in serum: Secondary | ICD-10-CM | POA: Diagnosis not present

## 2021-11-29 DIAGNOSIS — M1712 Unilateral primary osteoarthritis, left knee: Secondary | ICD-10-CM | POA: Diagnosis not present

## 2021-11-29 DIAGNOSIS — M7918 Myalgia, other site: Secondary | ICD-10-CM

## 2021-11-29 DIAGNOSIS — M792 Neuralgia and neuritis, unspecified: Secondary | ICD-10-CM

## 2021-11-29 DIAGNOSIS — M797 Fibromyalgia: Secondary | ICD-10-CM

## 2021-11-29 DIAGNOSIS — M79641 Pain in right hand: Secondary | ICD-10-CM

## 2021-11-29 DIAGNOSIS — M6289 Other specified disorders of muscle: Secondary | ICD-10-CM

## 2021-11-29 DIAGNOSIS — Z91018 Allergy to other foods: Secondary | ICD-10-CM

## 2021-11-29 DIAGNOSIS — F419 Anxiety disorder, unspecified: Secondary | ICD-10-CM

## 2021-12-10 ENCOUNTER — Encounter: Payer: Self-pay | Admitting: Rheumatology

## 2021-12-11 ENCOUNTER — Telehealth: Payer: Self-pay | Admitting: Rheumatology

## 2021-12-11 ENCOUNTER — Telehealth: Payer: Self-pay

## 2021-12-11 NOTE — Telephone Encounter (Signed)
I returned patient's call.  Patient states that she has been experiencing severe pain all over. AV[facility] labs are pending.  I offered further evaluation with [place] or Dr. Raoul Pitch.  Patient stated that she will see Dr. Raoul Pitch as her office is close to her home.  I advised her to keep [place] posted.

## 2021-12-11 NOTE — Telephone Encounter (Signed)
Spoke with patient regarding results/recommendations.  

## 2021-12-11 NOTE — Telephone Encounter (Signed)
Pain is everywhere in her body where she can not get up. Has been in bed all day. Tylenol 500 mg (2) and Advil (4). Feels like it is inflammation but feels like bone pain. Advil helps more but she does not know if she can increase what she has already taken today.

## 2021-12-11 NOTE — Telephone Encounter (Signed)
Patient can take Tylenol and/or Advil.  She has normal liver and kidney function.  The only issue would be is if the Advil upsets her stomach.  If she takes with food this would be less likely. She can take advil 600mg  q 8 hours with food and tylenol [phone] mg q 8 hours (between advil doses). Of course, would not advise these dose more than a couple days. Make sure to eat and drink well over this time to stay hydrated

## 2021-12-11 NOTE — Telephone Encounter (Signed)
Patient called crying because she is in so much pain.  She is wanting to know if she can take any OTC meds to try and get some relief.  She was scare to take anything because of her health history.  Please call [phone]

## 2021-12-11 NOTE — Telephone Encounter (Signed)
Patient called the office stating she is in the most pain she has ever been in. Patient states she is crippled in bed. Patient would like to know her options of what she can take. Patient states she had her AVISE labs done a few days ago and is afraid she cant go a few weeks with this pain. Patient requests a call back.

## 2021-12-12 ENCOUNTER — Encounter: Payer: Self-pay | Admitting: Family Medicine

## 2021-12-16 ENCOUNTER — Telehealth: Payer: Self-pay

## 2021-12-16 NOTE — Telephone Encounter (Deleted)
Patient left a voicemail stating she left a message about extreme body pain on Wednesday every   did not have a fever but on Wednesday spiked a 102   This is some sort of birus that hits the body before fever or cough.  I wanted to let her know that it is viral   My blood work will be scued had bloodwork on Tuesday you don't need to call me back but wanted to let6 Dr. Herminio Heads know that there is a tremendous body pain prior to any other symptoms.

## 2021-12-16 NOTE — Telephone Encounter (Signed)
Patient left a voicemail stating she left a message last week about experiencing extreme body pain.  Patient states on Wednesday night 12/11/21 she had a fever of 102 deg.  Patient states she had labwork on Tuesday, 12/10/21 and is not sure if that will affect the results.  Patient states she doesn't need a return call, but wanted to let Dr. Estanislado Pandy know that there is a virus where you experience tremendous body pain prior to any other symptoms.

## 2021-12-24 ENCOUNTER — Telehealth: Payer: Self-pay

## 2021-12-24 NOTE — Telephone Encounter (Signed)
She may discontinue turmeric.  She can discuss use of Cymbalta with her PCP.  Cymbalta is useful in musculoskeletal pain.  If she starts Cymbalta she will have to take it on a regular basis and will need close monitoring by her PCP.

## 2021-12-24 NOTE — Telephone Encounter (Signed)
Patient advised she may discontinue turmeric.  Patient advised she can discuss use of Cymbalta with her PCP.  Cymbalta is useful in musculoskeletal pain.  If she starts Cymbalta she will have to take it on a regular basis and will need close monitoring by her PCP. Patient expressed understanding.

## 2021-12-24 NOTE — Telephone Encounter (Signed)
Patient left a voicemail stating Emily Joseph ordered labs and requested a return call with the results.

## 2021-12-24 NOTE — Telephone Encounter (Signed)
Patient advised her labs are unchanged and she should follow up in 6 months. Patient advised to come in earlier if she develops any new symptoms.Patient states she was referred to neurology. Patient states she just left that appointment and was given a prescription for Lyrica and Hydrocodone as a PRN, small amount per year. Patient states she does not want to take the Lyrica due to side effects. Patient would like to know if Cymbalta would be a good option for her. Patient states she can not tolerate the Tumeric. Patient states she would like to have something that will help with her discomfort that's mild. Please advise.

## 2022-03-12 ENCOUNTER — Other Ambulatory Visit: Payer: Self-pay | Admitting: Family Medicine

## 2022-03-12 ENCOUNTER — Ambulatory Visit (INDEPENDENT_AMBULATORY_CARE_PROVIDER_SITE_OTHER): Payer: BC Managed Care – PPO

## 2022-03-12 ENCOUNTER — Encounter: Payer: Self-pay | Admitting: Family Medicine

## 2022-03-12 ENCOUNTER — Encounter: Payer: Self-pay | Admitting: Orthopaedic Surgery

## 2022-03-12 ENCOUNTER — Ambulatory Visit (INDEPENDENT_AMBULATORY_CARE_PROVIDER_SITE_OTHER): Payer: BC Managed Care – PPO | Admitting: Orthopaedic Surgery

## 2022-03-12 DIAGNOSIS — N390 Urinary tract infection, site not specified: Secondary | ICD-10-CM

## 2022-03-12 DIAGNOSIS — M25551 Pain in right hip: Secondary | ICD-10-CM

## 2022-03-12 NOTE — Progress Notes (Signed)
? ?Office Visit Note ?  ?Patient: Emily Joseph           ?Date of Birth: 1971/10/03           ?MRN: [id number] ?Visit Date: 03/12/2022 ?             ?Requested by: Ma Hillock, DO ?[address] [place],   [id number] ?PCP: Howard Pouch A, DO ? ? ?Assessment & Plan: ?Visit Diagnoses:  ?1. Pain in right hip   ? ? ?Plan: Impression is chronic right hip pain.  Unclear etiology.  I feel that this is a combination of back pain and fibromyalgia and right hip pain.  I doubt that she has a structural problem in her hip joint but we agreed to try cortisone injection to see if she gets relief.  Follow-up as needed. ? ?Follow-Up Instructions: No follow-ups on file.  ? ?Orders:  ?Orders Placed This Encounter  ?Procedures  ? XR HIP UNILAT W OR W/O PELVIS 2-3 VIEWS RIGHT  ? ?No orders of the defined types were placed in this encounter. ? ? ? ? Procedures: ?No procedures performed ? ? ?Clinical Data: ?No additional findings. ? ? ?Subjective: ?Chief Complaint  ?Patient presents with  ? Right Hip - Pain  ? Lower Back - Pain  ? ? ?HPI ? ?Emily Joseph is a 51 year old female comes in for chronic right hip pain.  She also has groin pain.  Has been seeing a chiropractor for a long time has been told that she has a leg length discrepancy.  She has pain with sitting bending and doing daily activities.  [facility] physical [facility] and [place].  Had a cortisone injection 20 years ago by Dr. Herma Mering which helped but she does not recall if this was in the joint or not. ? ?Review of Systems  ?Constitutional: Negative.   ?HENT: Negative.    ?Eyes: Negative.   ?Respiratory: Negative.    ?Cardiovascular: Negative.   ?Endocrine: Negative.   ?Musculoskeletal: Negative.   ?Neurological: Negative.   ?Hematological: Negative.   ?Psychiatric/Behavioral: Negative.    ?All other systems reviewed and are negative. ? ? ?Objective: ?Vital Signs: LMP 05/20/2014 (Approximate)  ? ?Physical Exam ?Vitals and nursing note reviewed.   ?Constitutional:   ?   Appearance: She is well-developed.  ?HENT:  ?   Head: Normocephalic and atraumatic.  ?Pulmonary:  ?   Effort: Pulmonary effort is normal.  ?Abdominal:  ?   Palpations: Abdomen is soft.  ?Musculoskeletal:  ?   Cervical back: Neck supple.  ?Skin: ?   General: Skin is warm.  ?   Capillary Refill: Capillary refill takes less than 2 seconds.  ?Neurological:  ?   Mental Status: She is alert and oriented to person, place, and time.  ?Psychiatric:     ?   Behavior: Behavior normal.     ?   Thought Content: Thought content normal.     ?   Judgment: Judgment normal.  ? ? ?Ortho Exam ? ?Examination of right hip shows good range of motion.  Slight pain with circumduction and FADIR.  Flexion is well-tolerated.  Lateral hip is slightly tender.  No sciatic tension signs. ? ?Specialty Comments:  ?No specialty comments available. ? ?Imaging: ?XR HIP UNILAT W OR W/O PELVIS 2-3 VIEWS RIGHT ? ?Result Date: 03/12/2022 ?No acute or structural abnormalities  ? ? ?PMFS History: ?Patient Active Problem List  ? Diagnosis Date Noted  ? Pain in right hip 03/12/2022  ?  Esophageal web   ? Arthritis 09/07/2020  ? Female stress incontinence 09/07/2020  ? Heart disease 09/07/2020  ? Primary osteoarthritis of left knee 05/31/2020  ? Pelvic floor dysfunction 12/28/2018  ? Chronic RUQ pain - exacerbation 12/28/2018  ? Atrial flutter with rapid ventricular response ([place]) 10/27/2016  ? s/p minimally invasive resection of right atrial lipoma   ? Chronic pain 06/08/2016  ? Chronic fatigue 06/08/2016  ? GERD (gastroesophageal reflux disease) with esophagitis 06/08/2016  ? Palpitations 06/08/2016  ? Anxiety 06/08/2016  ? Multiple environmental allergies 03/04/2016  ? Multiple food allergies 03/04/2016  ? Irritable bowel syndrome with constipation 01/27/2013  ? Fibromyalgia 01/07/2007  ? ?Past Medical History:  ?Diagnosis Date  ? Adenomyosis   ? not papanicolaou smear of cervix and cervical HPV  ? Allergy   ? Anxiety 06/08/2016  ? -about  her health and about taking medications  ? Arthritis   ? ?  ? Asthma   ? Atrial mass   ? 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 Chronic fatigue 06/08/2016  ? -eval with rheum x2, neurology, gastroenterology  ? Chronic pain 06/08/2016  ? -all over her whole life; joints, muscles, head -numerous evaluations, Dr. Trudie Reed in 2017, [facility] rheum -seeing Dr. Jaynee Eagles, Neurologist for back pain with radicular symptoms  ? Complication of anesthesia   ? "with epidural bp bottoms out"  ? Dysrhythmia   ? fast hr  ? Eosinophilic esophagitis   ? Sees Dr. Carlean Purl  ? Fibromyalgia   ? GERD (gastroesophageal reflux disease)   ? H/O laminectomy 09/07/2020  ? Heart murmur   ? History of atrial flutter 10/2016  ? spontaneous conversion to sinus  ? History of gallstones   ? History of laparoscopic-assisted vaginal hysterectomy 09/07/2020  ? History of pneumonia   ? when pt was pregnant  ? History of pneumothorax 07/2016  ? Right  ? History of sinus tachycardia   ? Iron deficiency anemia due to chronic blood loss   ? Menorrhagia, s/p complete hysterectomy, ovaries remain hx  ? Irritable bowel syndrome 01/27/2013  ? Dr Carlean Purl 02/2016   ? Microhematuria   ? Mouth problem   ? failed gum graft 1/16  ? Nasal airway abnormality   ? Nasal obstruction 06/26/2015  ? Seen by ENT 06-26-15.  May need sleep study to see if having apnea    ? Palpitations   ? Pelvic floor dysfunction in female   ? s/p minimally invasive resection of right atrial lipoma   ? 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 Shortness of breath dyspnea   ? SUI (stress urinary incontinence, female)   ? Syncope   ? with last child with epideral  ? UTI (urinary tract infection) 07/14/2016  ? >=100,000 COLONIES/mL KLEBSIELLA PNEUMONIA  ? Vitamin D deficiency   ?  ?Family History  ?Problem Relation Age of Onset  ? Cancer Mother   ? Breast cancer Mother 82  ? Lung cancer Mother   ? CAD Father   ?     MI at age 91  ? Stroke Father   ? Cancer Father   ?      Cholangiocarcinoma  ? Goiter Maternal Grandmother   ? Cancer Maternal Grandfather   ? Lung cancer Maternal Grandfather   ? Rheum arthritis Paternal Grandmother   ? Stroke Paternal Grandfather   ? Kidney disease Paternal Grandfather   ? Alport syndrome Paternal Grandfather   ? Esophageal cancer  Paternal Uncle   ? Polycystic ovary syndrome Daughter   ?     pre-diabetic   ? Healthy Son   ? Allergies Daughter   ? Healthy Daughter   ? Myasthenia gravis Sister   ? Chiari malformation Sister   ? Hematuria Sister   ?  ?Past Surgical History:  ?Procedure Laterality Date  ? APPENDECTOMY  01/1989  ? BALLOON DILATION N/A 02/09/2013  ? Procedure: BALLOON DILATION;  Surgeon: Arta Silence, MD;  Location: [facility];  Service: Endoscopy;  Laterality: N/A;  ? BILATERAL SALPINGECTOMY N/A 06/14/2014  ? Procedure: BILATERAL SALPINGECTOMY;  Surgeon: Allena Katz, MD;  Location: [facility];  Service: Gynecology;  Laterality: N/A;  ? BIOPSY  09/04/2021  ? Procedure: BIOPSY;  Surgeon: Gatha Mayer, MD;  Location: [facility];  Service: Endoscopy;;  ? BREAST BIOPSY Right   ? biopsy   ? CHOLECYSTECTOMY N/A 11/21/2019  ? Procedure: LAPAROSCOPIC CHOLECYSTECTOMY WITH INTRAOPERATIVE CHOLANGIOGRAM;  Surgeon: Alphonsa Overall, MD;  Location: [facility];  Service: General;  Laterality: N/A;  ? COLONOSCOPY WITH PROPOFOL N/A 02/09/2013  ? Procedure: COLONOSCOPY WITH PROPOFOL;  Surgeon: Arta Silence, MD;  Location: [facility];  Service: Endoscopy;  Laterality: N/A;  need ultra thin colon scope  ? ESOPHAGEAL DILATION  09/04/2021  ? Procedure: ESOPHAGEAL DILATION;  Surgeon: Gatha Mayer, MD;  Location: [facility];  Service: Endoscopy;;  ? ESOPHAGOGASTRODUODENOSCOPY Left 06/30/2021  ? Procedure: ESOPHAGOGASTRODUODENOSCOPY (EGD);  Surgeon: Lavena Bullion, DO;  Location: [facility];  Service: Gastroenterology;  Laterality: Left;  ? ESOPHAGOGASTRODUODENOSCOPY (EGD) WITH PROPOFOL N/A 02/09/2013  ? Procedure: ESOPHAGOGASTRODUODENOSCOPY (EGD) WITH  PROPOFOL;  Surgeon: Arta Silence, MD;  Location: [facility];  Service: Endoscopy;  Laterality: N/A;  ? ESOPHAGOGASTRODUODENOSCOPY (EGD) WITH PROPOFOL N/A 09/04/2021  ? Procedure: ESOPHAGOGASTRODUODENOSCOPY (EGD) WITH PROPO

## 2022-03-12 NOTE — Telephone Encounter (Signed)
Please advise 

## 2022-03-13 ENCOUNTER — Other Ambulatory Visit: Payer: Self-pay

## 2022-03-13 ENCOUNTER — Encounter: Payer: Self-pay | Admitting: Family Medicine

## 2022-03-13 ENCOUNTER — Telehealth: Payer: Self-pay | Admitting: Orthopaedic Surgery

## 2022-03-13 DIAGNOSIS — M25551 Pain in right hip: Secondary | ICD-10-CM

## 2022-03-13 MED ORDER — CEPHALEXIN 250 MG PO CAPS: ORAL_CAPSULE | ORAL | 1 refills | Status: DC

## 2022-03-13 NOTE — Telephone Encounter (Signed)
ORDER MADE 

## 2022-03-13 NOTE — Progress Notes (Signed)
Cumulative Time:12 min ?Consent: Pt reached out via MyChart ? ?CC: UTI medication ?ONG:EXBMW S Emily Joseph is a 51 y.o. female requesting recommendations and medication for recurrent UTI.  She reports she has had few recurrent UTIs since her gallbladder surgery.  Her urologist, Dr. Jeffie Pollock, reportedly had recommended prophylactic cephalexin for use postcoital and to "keep on hand.  " She is traveling soon and is asking if we can refill this medication for her in case she has cystitis.  She had elected not to follow-up with Dr. Jeffie Pollock since she had been doing so well. ? ?AP ?-Keflex 250 mg p.o. postcoital #30, 1 refill prescribed for recurrent UTI prophylaxis. ?-If UTI is felt to be present, would encourage patient to follow-up in the office so she can be tested before moving forward with treatment presumptuously, in order to avoid antibiotic resistance development. ? ? ?Please see the MyChart message reply(ies) for my assessment and plan. ? ?The patient gave consent for this Medical Advice Message and is aware that it may result in a bill to their insurance company as well as the possibility that this may result in a co-payment or deductible. They are an established patient, but are not seeking medical advice exclusively about a problem treated during an in person or video visit in the last 7 days. I did not recommend an in person or video visit within 7 days of my reply. ? ?I spent a total of 12 minutes cumulative time within 7 days through [employer] ?Howard Pouch, DO ? ?

## 2022-03-13 NOTE — Telephone Encounter (Signed)
Pt called and states she was seen yesterday and she was suppose to have a referral for Dr.Newton for a Right hip injection. Can we put it in?  ? ?CB [phone]  ?

## 2022-03-17 ENCOUNTER — Encounter: Payer: Self-pay | Admitting: Cardiology

## 2022-03-17 ENCOUNTER — Telehealth: Payer: Self-pay | Admitting: Physical Medicine and Rehabilitation

## 2022-03-17 NOTE — Telephone Encounter (Signed)
Patient called in stating she got a call and message stating her Gel injections will need to be cancelled she says she needs the injection before June if that can be rescheduled. ?

## 2022-03-17 NOTE — Telephone Encounter (Signed)
Per Merleen Nicely. She spoke to patient and states she was referring to hip inj ?

## 2022-03-19 ENCOUNTER — Encounter: Payer: BC Managed Care – PPO | Admitting: Physical Medicine and Rehabilitation

## 2022-03-19 ENCOUNTER — Telehealth: Payer: Self-pay | Admitting: Physical Medicine and Rehabilitation

## 2022-03-19 ENCOUNTER — Encounter: Payer: Self-pay | Admitting: Cardiology

## 2022-03-19 NOTE — Telephone Encounter (Signed)
Patient called asked for a call back as soon as possible. Patient said she found out some new information yesterday. Patient said she is not sure she should get the injection.   ?Patient said she has an appointment this afternoon. ? ?The number to contact patient is [phone]  ?

## 2022-03-19 NOTE — Telephone Encounter (Signed)
I spoke with Faustino Congress, PT upstairs. She has actually worked with people with this condition and would be glad to see patient if she chooses. She also states that Raeford Razor, PT with [place] [facility], has treated people with these Elhers Danlos.  ? ?I left voicemail for patient advising of Dr. Romona Curls response, as well as information given by Colletta Maryland.  ?

## 2022-05-05 ENCOUNTER — Encounter: Payer: Self-pay | Admitting: Family Medicine

## 2022-05-05 ENCOUNTER — Ambulatory Visit (INDEPENDENT_AMBULATORY_CARE_PROVIDER_SITE_OTHER): Payer: BC Managed Care – PPO | Admitting: Family Medicine

## 2022-05-05 VITALS — BP 84/54 | HR 89 | Temp 98.0°F | Ht 67.0 in | Wt 163.0 lb

## 2022-05-05 DIAGNOSIS — F419 Anxiety disorder, unspecified: Secondary | ICD-10-CM

## 2022-05-05 DIAGNOSIS — I9589 Other hypotension: Secondary | ICD-10-CM | POA: Diagnosis not present

## 2022-05-05 DIAGNOSIS — R109 Unspecified abdominal pain: Secondary | ICD-10-CM

## 2022-05-05 DIAGNOSIS — G8929 Other chronic pain: Secondary | ICD-10-CM

## 2022-05-05 DIAGNOSIS — M1712 Unilateral primary osteoarthritis, left knee: Secondary | ICD-10-CM

## 2022-05-05 LAB — COMPREHENSIVE METABOLIC PANEL
ALT: 27 U/L (ref 0–35)
AST: 19 U/L (ref 0–37)
Albumin: 4.2 g/dL (ref 3.5–5.2)
Alkaline Phosphatase: 75 U/L (ref 39–117)
BUN: 11 mg/dL (ref 6–23)
CO2: 25 mEq/L (ref 19–32)
Calcium: 9.2 mg/dL (ref 8.4–10.5)
Chloride: 102 mEq/L (ref 96–112)
Creatinine, Ser: 0.75 mg/dL (ref 0.40–1.20)
GFR: 92.48 mL/min (ref >60.00)
Glucose, Bld: 108 mg/dL — ABNORMAL HIGH (ref 70–99)
Potassium: 3.7 mEq/L (ref 3.5–5.1)
Sodium: 138 mEq/L (ref 135–145)
Total Bilirubin: 0.7 mg/dL (ref 0.2–1.2)
Total Protein: 7 g/dL (ref 6.0–8.3)

## 2022-05-05 LAB — CBC
HCT: 39.7 % (ref 36.0–46.0)
Hemoglobin: 13.2 g/dL (ref 12.0–15.0)
MCHC: 33.2 g/dL (ref 30.0–36.0)
MCV: 89.1 fl (ref 78.0–100.0)
Platelets: 283 10*3/uL (ref 150.0–400.0)
RBC: 4.45 Mil/uL (ref 3.87–5.11)
RDW: 13.1 % (ref 11.5–15.5)
WBC: 8 10*3/uL (ref 4.0–10.5)

## 2022-05-05 MED ORDER — DULOXETINE HCL 20 MG PO CPEP: 20.0000 mg | ORAL_CAPSULE | Freq: Every day | ORAL | 2 refills | Status: DC

## 2022-05-06 ENCOUNTER — Telehealth: Payer: Self-pay | Admitting: Family Medicine

## 2022-05-06 DIAGNOSIS — R1011 Right upper quadrant pain: Secondary | ICD-10-CM

## 2022-05-06 DIAGNOSIS — N200 Calculus of kidney: Secondary | ICD-10-CM

## 2022-05-06 DIAGNOSIS — R109 Unspecified abdominal pain: Secondary | ICD-10-CM

## 2022-05-06 LAB — URINALYSIS W MICROSCOPIC + REFLEX CULTURE
Bacteria, UA: NONE SEEN /HPF
Bilirubin Urine: NEGATIVE
Glucose, UA: NEGATIVE
Hyaline Cast: NONE SEEN /LPF
Ketones, ur: NEGATIVE
Leukocyte Esterase: NEGATIVE
Nitrites, Initial: NEGATIVE
Protein, ur: NEGATIVE
Specific Gravity, Urine: 1.016 (ref 1.001–1.035)
WBC, UA: NONE SEEN /HPF (ref 0–5)
pH: 5.5 (ref 5.0–8.0)

## 2022-05-06 LAB — NO CULTURE INDICATED

## 2022-05-07 NOTE — Telephone Encounter (Signed)
Spoke with patient regarding results/recommendations.  Pt states that if CT is with contrast she is allergic to it. Last time she had to take prednisone to get through appt. I advised pt to ask when called to schedule.

## 2022-05-07 NOTE — Telephone Encounter (Signed)
LM for pt to return call to discuss.  

## 2022-05-07 NOTE — Telephone Encounter (Signed)
Patient returning call regarding lab results.  Please call back when available.

## 2022-05-09 ENCOUNTER — Encounter: Payer: Self-pay | Admitting: Family Medicine

## 2022-05-09 ENCOUNTER — Emergency Department (HOSPITAL_BASED_OUTPATIENT_CLINIC_OR_DEPARTMENT_OTHER)
Admission: EM | Admit: 2022-05-09 | Discharge: 2022-05-09 | Disposition: A | Discharge status: Home / Self Care | Payer: BC Managed Care – PPO | Attending: Emergency Medicine | Admitting: Emergency Medicine

## 2022-05-09 ENCOUNTER — Other Ambulatory Visit: Payer: Self-pay

## 2022-05-09 DIAGNOSIS — R030 Elevated blood-pressure reading, without diagnosis of hypertension: Secondary | ICD-10-CM | POA: Diagnosis not present

## 2022-05-09 DIAGNOSIS — Z91040 Latex allergy status: Secondary | ICD-10-CM | POA: Diagnosis not present

## 2022-05-09 DIAGNOSIS — R110 Nausea: Secondary | ICD-10-CM | POA: Insufficient documentation

## 2022-05-09 DIAGNOSIS — R5383 Other fatigue: Secondary | ICD-10-CM | POA: Insufficient documentation

## 2022-05-09 LAB — CBG MONITORING, ED: Glucose-Capillary: 99 mg/dL (ref 70–99)

## 2022-05-09 NOTE — ED Notes (Signed)
Patient given discharge instructions, all questions answered. Patient in possession of all belongings, directed to the discharge area  

## 2022-05-09 NOTE — ED Triage Notes (Signed)
Patient arrives with complaints of fatigue, hypertension (256'H systolic), disorientation for 3 days. Patient was recently started cymbalta for nerve pain

## 2022-05-09 NOTE — Discharge Instructions (Signed)
Follow-up with your regular doctor.  Come back to ER as needed.

## 2022-05-09 NOTE — Telephone Encounter (Signed)
Pt called and transferred to triage by Ashley Akin.

## 2022-05-09 NOTE — ED Provider Notes (Signed)
[facility] EMERGENCY DEPT Provider Note   CSN: [id number] Arrival date & time: 05/09/22  1439     History {Add pertinent medical, surgical, social history, OB history to HPI:1} Chief Complaint  Patient presents with   Fatigue    Emily Joseph is a 51 y.o. female.  HPI     Home Medications Prior to Admission medications   Medication Sig Start Date End Date Taking? Authorizing Provider  albuterol (VENTOLIN HFA) 108 (90 Base) MCG/ACT inhaler Inhale 2 puffs into the lungs as needed for wheezing or shortness of breath. 10/08/20   [provider]  Azelastine-Fluticasone 137-50 MCG/ACT SUSP Place 1 spray into both nostrils 2 (two) times daily. 10/08/21   [provider]  cetirizine (ZYRTEC) 10 MG tablet Take 10 mg by mouth daily.    [provider]  Cholecalciferol (VITAMIN D3) 125 MCG (5000 UT) TABS Take 5,000 Units by mouth daily.    [provider]  DULoxetine (CYMBALTA) 20 MG capsule Take 1 capsule (20 mg total) by mouth daily. 05/05/22   Kuneff, Renee A, DO  EPINEPHrine 0.3 mg/0.3 mL IJ SOAJ injection Inject 0.3 mg into the muscle as needed for anaphylaxis.     [provider]  fexofenadine (ALLEGRA ODT) 30 MG disintegrating tablet Take 30 mg by mouth daily.    [provider]  Glycerin-Hypromellose-PEG 400 (DRY EYE RELIEF DROPS) 0.2-0.2-1 % SOLN Place 1 drop into both eyes daily.    [provider]  lansoprazole (PREVACID) 15 MG capsule Take 15 mg by mouth 2 (two) times daily before a meal.    [provider]  Methylcobalamin (METHYL B-12 PO) Take by mouth as needed.    [provider]  metoprolol tartrate (LOPRESSOR) 25 MG tablet Take 0.5 tablets (12.5 mg total) by mouth 2 (two) times daily. 10/14/21   Lelon Perla, MD  Polyethyl Glycol-Propyl Glycol (SYSTANE OP) Place 1 drop into both eyes 2 (two) times daily.    [provider]  polyethylene glycol (MIRALAX / GLYCOLAX) 17  g packet Take 17 g by mouth daily.    [provider]  Probiotic Product (PROBIOTIC ADVANCED PO) Take 1 tablet by mouth 2 (two) times daily. Garden of life Children chewable    [provider]      Allergies    Avocado, Macadamia nut oil, Mango flavor, Other, Ciprofloxacin, Demerol [meperidine], Iodinated contrast media, Macrobid [nitrofurantoin], Betadine [povidone iodine], Latex, Dulcolax [bisacodyl], Sulfamethoxazole, and Tape    Review of Systems   Review of Systems  Physical Exam Updated Vital Signs BP 120/69   Pulse 75   Temp 97.9 F (36.6 C)   Resp 16   Ht '5\' 7"'$  (1.702 m)   Wt 72.6 kg   LMP 05/20/2014 (Approximate)   SpO2 100%   BMI 25.06 kg/m  Physical Exam  ED Results / Procedures / Treatments   Labs (all labs ordered are listed, but only abnormal results are displayed) Labs Reviewed  CBG MONITORING, ED    EKG None  Radiology No results found.  Procedures Procedures  {Document cardiac monitor, telemetry assessment procedure when appropriate:1}  Medications Ordered in ED Medications - No data to display  ED Course/ Medical Decision Making/ A&P                           Medical Decision Making  ***  {Document critical care time when appropriate:1} {Document review of labs and clinical decision tools ie  heart score, Chads2Vasc2 etc:1}  {Document your independent review of radiology images, and any outside records:1} {Document your discussion with family members, caretakers, and with consultants:1} {Document social determinants of health affecting pt's care:1} {Document your decision making why or why not admission, treatments were needed:1} Final Clinical Impression(s) / ED Diagnoses Final diagnoses:  Other fatigue    Rx / DC Orders ED Discharge Orders     None

## 2022-05-12 NOTE — Telephone Encounter (Signed)
Pt was seen at ED on 6/30  [facility] [place] RECORD Access[facility]urse Patient Name: Emily Joseph Gender: Female DOB: 09-07-1971 Age: 51 Y 11 M 19D Return Phone Number: [phone] (Primary) Address: [place] [address] Client  Primary [facility] - Client Client Site [facility] Day Provider Raoul Pitch, [place] Type Call Who Is Calling Patient / Member / Family / Caregiver Call Type Triage / Clinical Relationship To Patient Self Return Phone Number [phone] (Primary) Chief Complaint Dizziness Reason for Call Symptomatic / Request for [place] states she is feeling lightheaded, dizzy, and has brain fog. Translation No Nurse Assessment Nurse: Dawna Part, RN, Jarrett Soho Date/Time (Eastern Time): 05/09/2022 1:52:57 PM Confirm and document reason for call. If symptomatic, describe symptoms. ---Caller reports starting Cymbalta 3 days ago, nausea, brain fog, elevated blood pressure, chills started 3 days ago. Current blood pressure 140/76 Does the patient have any new or worsening symptoms? ---Yes Will a triage be completed? ---Yes Related visit to physician within the last 2 weeks? ---Yes Does the PT have any chronic conditions? (i.e. diabetes, asthma, this includes High risk factors for pregnancy, etc.) ---Yes List chronic conditions. ---Cardiac resection with graft Is the patient pregnant or possibly pregnant? (Ask all females between the ages of 1-55) ---No Is this a behavioral health or substance abuse call? ---No Guidelines Guideline Title Affirmed Question Affirmed Notes Nurse Date/Time (Eastern Time) Dizziness - Lightheadedness Patient sounds very sick or weak to the triager Dawna Part, RN, Jarrett Soho 05/09/2022 1:56:22 PM Disp. Time Eilene Ghazi Time) Disposition Final User 05/09/2022 2:01:58 PM Go to ED Now (or PCP triage) Yes Riddle, RN,  Jarrett Soho PLEASE NOTE: All timestamps contained within this report are represented as [place] Standard Time. CONFIDENTIALTY NOTICE: This fax transmission is intended only for the addressee. It contains information that is legally privileged, confidential or otherwise protected from use or disclosure. If you are not the intended recipient, you are strictly prohibited from reviewing, disclosing, copying using or disseminating any of this information or taking any action in reliance on or regarding this information. If you have received this fax in error, please notify us immediately by telephone so that we can arrange for its return to [place]. Phone: [phone], Toll-Free: [phone], Fax: [phone] Page: 2 of 2 Call Id: [id number] Final Disposition 05/09/2022 2:01:58 PM Go to ED Now (or PCP triage) Yes Dawna Part, RN, [name] Disagree/Comply Comply Caller Understands Yes PreDisposition Call Doctor Care Advice Given Per Guideline GO TO ED NOW (OR PCP TRIAGE): * IF NO PCP (PRIMARY CARE PROVIDER) SECOND-LEVEL TRIAGE: You need to be seen within the next hour. Go to the [facility] at _____________ [place] as soon as you can. CARE ADVICE given per Dizziness (Adult) guideline. Comments User: Tedd Sias, RN Date/Time Eilene Ghazi Time): 05/09/2022 1:58:39 PM HR 99 Blood pressure is 134/70 Referrals [facility] - ED
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
## 2022-07-24 ENCOUNTER — Encounter: Payer: Self-pay | Admitting: Rheumatology

## 2022-07-24 ENCOUNTER — Ambulatory Visit: Payer: BC Managed Care – PPO | Attending: Rheumatology | Admitting: Rheumatology

## 2022-07-24 VITALS — BP 125/85 | HR 73 | Resp 16 | Ht 67.0 in | Wt 165.4 lb

## 2022-07-24 DIAGNOSIS — R7689 Other specified abnormal immunological findings in serum: Secondary | ICD-10-CM

## 2022-07-24 DIAGNOSIS — M792 Neuralgia and neuritis, unspecified: Secondary | ICD-10-CM

## 2022-07-24 DIAGNOSIS — R5382 Chronic fatigue, unspecified: Secondary | ICD-10-CM

## 2022-07-24 DIAGNOSIS — M797 Fibromyalgia: Secondary | ICD-10-CM

## 2022-07-24 DIAGNOSIS — I5189 Other ill-defined heart diseases: Secondary | ICD-10-CM

## 2022-07-24 DIAGNOSIS — R768 Other specified abnormal immunological findings in serum: Secondary | ICD-10-CM

## 2022-07-24 DIAGNOSIS — F419 Anxiety disorder, unspecified: Secondary | ICD-10-CM

## 2022-07-24 DIAGNOSIS — M1712 Unilateral primary osteoarthritis, left knee: Secondary | ICD-10-CM | POA: Diagnosis not present

## 2022-07-24 DIAGNOSIS — M79642 Pain in left hand: Secondary | ICD-10-CM

## 2022-07-24 DIAGNOSIS — M7712 Lateral epicondylitis, left elbow: Secondary | ICD-10-CM

## 2022-07-24 DIAGNOSIS — M7061 Trochanteric bursitis, right hip: Secondary | ICD-10-CM

## 2022-07-24 DIAGNOSIS — K581 Irritable bowel syndrome with constipation: Secondary | ICD-10-CM

## 2022-07-24 DIAGNOSIS — M79641 Pain in right hand: Secondary | ICD-10-CM | POA: Diagnosis not present

## 2022-07-24 DIAGNOSIS — K2100 Gastro-esophageal reflux disease with esophagitis, without bleeding: Secondary | ICD-10-CM

## 2022-07-24 DIAGNOSIS — M6289 Other specified disorders of muscle: Secondary | ICD-10-CM

## 2022-07-24 DIAGNOSIS — Z9109 Other allergy status, other than to drugs and biological substances: Secondary | ICD-10-CM

## 2022-07-24 DIAGNOSIS — M7062 Trochanteric bursitis, left hip: Secondary | ICD-10-CM

## 2022-07-24 DIAGNOSIS — Z91018 Allergy to other foods: Secondary | ICD-10-CM

## 2022-07-24 DIAGNOSIS — M7711 Lateral epicondylitis, right elbow: Secondary | ICD-10-CM

## 2022-07-24 NOTE — Addendum Note (Signed)
Addended by: Francis Gaines C on: 07/24/2022 11:10 AM   Modules accepted: Orders

## 2022-09-01 ENCOUNTER — Telehealth: Payer: Self-pay | Admitting: Nurse Practitioner

## 2022-09-01 NOTE — Telephone Encounter (Signed)
Inbound call from patient stating that she is having issues with abdominal bloating with pain on the right side, and constipation. Patient is requesting a call back to discuss if a colonoscopy is needed. Please advise.

## 2022-09-01 NOTE — Telephone Encounter (Signed)
Pt stated that she has an increase in constipation and bloating and questioning if she needs an colonoscopy: Pt was scheduled for an office visit on 09/15/2022 at 9:10 AM with Dr. Carlean Purl: Pt made aware:  Pt verbalized understanding with all questions answered.

## 2022-09-15 ENCOUNTER — Ambulatory Visit: Payer: BC Managed Care – PPO

## 2022-09-15 ENCOUNTER — Ambulatory Visit (INDEPENDENT_AMBULATORY_CARE_PROVIDER_SITE_OTHER): Payer: BC Managed Care – PPO | Admitting: Internal Medicine

## 2022-09-15 ENCOUNTER — Encounter: Payer: Self-pay | Admitting: Internal Medicine

## 2022-09-15 VITALS — BP 112/68 | HR 88 | Ht 67.5 in | Wt 166.0 lb

## 2022-09-15 DIAGNOSIS — N816 Rectocele: Secondary | ICD-10-CM

## 2022-09-15 DIAGNOSIS — M6208 Separation of muscle (nontraumatic), other site: Secondary | ICD-10-CM

## 2022-09-15 DIAGNOSIS — K644 Residual hemorrhoidal skin tags: Secondary | ICD-10-CM

## 2022-09-15 DIAGNOSIS — K5909 Other constipation: Secondary | ICD-10-CM | POA: Diagnosis not present

## 2022-09-15 DIAGNOSIS — R1011 Right upper quadrant pain: Secondary | ICD-10-CM

## 2022-09-15 DIAGNOSIS — G8929 Other chronic pain: Secondary | ICD-10-CM

## 2022-09-15 NOTE — Therapy (Incomplete)
OUTPATIENT PHYSICAL THERAPY FEMALE PELVIC EVALUATION   Patient Name: Emily Joseph MRN: [id number] DOB:16-Jun-1971, 51 y.o., female Today's Date: 09/15/2022    Past Medical History:  Diagnosis Date   Adenomyosis    not papanicolaou smear of cervix and cervical HPV   Allergy    Anxiety 06/08/2016   -about her health and about taking medications   Arthritis    ?   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 Trudie Reed in 2017, [place] rheum -seeing Dr. Jaynee Eagles, Neurologist for back pain with radicular symptoms   Complication of anesthesia    "with epidural bp bottoms out"   Dysrhythmia    fast hr   Eosinophilic esophagitis    Sees Dr. Carlean Purl   Fibromyalgia    GERD (gastroesophageal reflux disease)    H/O laminectomy 09/07/2020   Heart murmur    History of atrial flutter 10/2016   spontaneous conversion to sinus   History of gallstones    History of laparoscopic-assisted vaginal hysterectomy 09/07/2020   History of pneumonia    when pt was pregnant   History of pneumothorax 07/2016   Right   History of sinus tachycardia    Iron deficiency anemia due to chronic blood loss    Menorrhagia, s/p complete hysterectomy, ovaries remain hx   Irritable bowel syndrome 01/27/2013   Dr Carlean Purl 02/2016    Microhematuria    Mouth problem    failed gum graft 1/16   Nasal airway abnormality    Nasal obstruction 06/26/2015   Seen by ENT 06-26-15.  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   Shortness of breath dyspnea    SUI (stress urinary incontinence, female)    Syncope    with last child with epideral   UTI  (urinary tract infection) 07/14/2016   >=100,000 COLONIES/mL KLEBSIELLA PNEUMONIA   Vitamin D deficiency    Past Surgical History:  Procedure Laterality Date   APPENDECTOMY  01/1989   BALLOON DILATION N/A 02/09/2013   Procedure: BALLOON DILATION;  Surgeon: Arta Silence, MD;  [facility];  Service: Endoscopy;  Laterality: N/A;   BILATERAL SALPINGECTOMY N/A 06/14/2014   Procedure: BILATERAL SALPINGECTOMY;  Surgeon: Allena Katz, MD;  Location: [facility];  Service: Gynecology;  Laterality: N/A;   BIOPSY  09/04/2021   Procedure: BIOPSY;  Surgeon: Gatha Mayer, MD;  [facility];  Service: Endoscopy;;   BREAST BIOPSY Right    biopsy    CHOLECYSTECTOMY N/A 11/21/2019   Procedure: LAPAROSCOPIC CHOLECYSTECTOMY WITH INTRAOPERATIVE CHOLANGIOGRAM;  Surgeon: Alphonsa Overall, MD;  [facility];  Service: General;  Laterality: N/A;   COLONOSCOPY WITH PROPOFOL N/A 02/09/2013   Procedure: COLONOSCOPY WITH PROPOFOL;  Surgeon: Arta Silence, MD;  [facility];  Service: Endoscopy;  Laterality: N/A;  need ultra thin colon scope   ESOPHAGEAL DILATION  09/04/2021   Procedure: ESOPHAGEAL DILATION;  Surgeon: Gatha Mayer, MD;  [facility];  Service: Endoscopy;;   ESOPHAGOGASTRODUODENOSCOPY Left 06/30/2021   Procedure: ESOPHAGOGASTRODUODENOSCOPY (EGD);  Surgeon: Gerrit Heck  V, DO;  Location: [facility];  Service: Gastroenterology;  Laterality: Left;   ESOPHAGOGASTRODUODENOSCOPY (EGD) WITH PROPOFOL N/A 02/09/2013   Procedure: ESOPHAGOGASTRODUODENOSCOPY (EGD) WITH PROPOFOL;  Surgeon: Arta Silence, MD;  [facility];  Service: Endoscopy;  Laterality: N/A;   ESOPHAGOGASTRODUODENOSCOPY (EGD) WITH PROPOFOL N/A 09/04/2021   Procedure: ESOPHAGOGASTRODUODENOSCOPY (EGD) WITH PROPOFOL;  Surgeon: Gatha Mayer, MD;  [facility];  Service: Endoscopy;  Laterality: N/A;   FOREIGN BODY REMOVAL  06/30/2021   Procedure: FOREIGN BODY REMOVAL;  Surgeon:  Lavena Bullion, DO;  Location: [facility];  Service: Gastroenterology;;   LAPAROSCOPIC ASSISTED VAGINAL HYSTERECTOMY Right 06/14/2014   Procedure: OPEN LAPAROSCOPIC ASSISTED VAGINAL HYSTERECTOMY;  Surgeon: Allena Katz, MD;  Location: [facility];  Service: Gynecology;  Laterality: Right;   LYSIS OF ADHESION N/A 06/14/2014   Procedure: LYSIS OF ADHESION;  Surgeon: Allena Katz, MD;  Location: [facility];  Service: Gynecology;  Laterality: N/A;   MINIMALLY INVASIVE EXCISION OF ATRIAL MYXOMA Right 07/11/2016   Procedure: MINIMALLY INVASIVE RESECTION OF RIGHT ATRIAL LIPOMA WITH CLOSURE OF PATENT FORAMEN OVALE;  Surgeon: Rexene Alberts, MD;  Location: [facility];  Service: Open Heart Surgery;  Laterality: Right;   OVARIAN CYST REMOVAL Right 1990   TEE WITHOUT CARDIOVERSION N/A 07/11/2016   Procedure: TRANSESOPHAGEAL ECHOCARDIOGRAM (TEE);  Surgeon: Rexene Alberts, MD;  Location: [facility];  Service: Open Heart Surgery;  Laterality: N/A;   Patient Active Problem List   Diagnosis Date Noted   Pain in right hip 03/12/2022   Esophageal web    Female stress incontinence 09/07/2020   Heart disease 09/07/2020   Primary osteoarthritis of left knee 05/31/2020   Pelvic floor dysfunction 12/28/2018   Chronic RUQ pain - exacerbation 12/28/2018   Atrial flutter with rapid ventricular response ([place]) 10/27/2016   s/p minimally invasive resection of right atrial lipoma    Chronic pain 06/08/2016   Chronic fatigue 06/08/2016   GERD (gastroesophageal reflux disease) with esophagitis 06/08/2016   Palpitations 06/08/2016   Anxiety 06/08/2016   Multiple environmental allergies 03/04/2016   Multiple food allergies 03/04/2016   Irritable bowel syndrome with constipation 01/27/2013   Fibromyalgia 01/07/2007    PCP: Ma Hillock, DO   REFERRING PROVIDER: Gatha Mayer, MD   REFERRING DIAG: K59.09 (ICD-10-CM) - Chronic constipation   THERAPY DIAG:  No diagnosis found.  Rationale for Evaluation and  Treatment: Rehabilitation  ONSET DATE: ***  SUBJECTIVE:  SUBJECTIVE STATEMENT: *** Fluid intake: {Yes/No:304960894}   PAIN:  Are you having pain? {yes/no:20286} NPRS scale: ***/10 Pain location: {pelvic pain location:27098}  Pain type: {type:313116} Pain description: {PAIN DESCRIPTION:21022940}   Aggravating factors: *** Relieving factors: ***  PRECAUTIONS: None  WEIGHT BEARING RESTRICTIONS: No  FALLS:  Has patient fallen in last 6 months? No  LIVING ENVIRONMENT: Lives with: lives with their family Lives in: House/apartment   OCCUPATION: ***  PLOF: {PLOF:24004}  PATIENT GOALS: ***  PERTINENT HISTORY:  *** Sexual abuse: {Yes/No:304960894}  BOWEL MOVEMENT: Pain with bowel movement: {yes/no:20286} Type of bowel movement:{PT BM type:27100} Fully empty rectum: {Yes/No:304960894} Leakage: {Yes/No:304960894} Pads: {Yes/No:304960894} Fiber supplement: {Yes/No:304960894}  URINATION: Pain with urination: {yes/no:20286} Fully empty bladder: {Yes/No:304960894} Stream: {PT urination:27102} Urgency: {Yes/No:304960894} Frequency: *** Leakage: {PT leakage:27103} Pads: {Yes/No:304960894}  INTERCOURSE: Pain with intercourse: {pain with intercourse PA:27099} Ability to have vaginal penetration:  {Yes/No:304960894} Climax: *** Marinoff Scale: ***/3  PREGNANCY: Vaginal deliveries *** Tearing {Yes***/No:304960894} C-section deliveries *** Currently pregnant {Yes***/No:304960894}  PROLAPSE: {PT prolapse:27101}   OBJECTIVE:   DIAGNOSTIC FINDINGS:  ***  PATIENT SURVEYS:   PFIQ-7 ***  COGNITION: Overall cognitive status: Within functional limits for tasks assessed     SENSATION: Light touch: Appears intact Proprioception: Appears intact  MUSCLE LENGTH:   FUNCTIONAL  TESTS:    GAIT: Comments: ***  POSTURE: {posture:25561}   LUMBARAROM/PROM:  A/PROM A/PROM  eval  Flexion   Extension   Right lateral flexion   Left lateral flexion   Right rotation   Left rotation    (Blank rows = not tested)    LOWER EXTREMITY MMT:   PALPATION:   General  ***                External Perineal Exam ***                             Internal Pelvic Floor ***  Patient confirms identification and approves PT to assess internal pelvic floor and treatment {yes/no:20286}  PELVIC MMT:   MMT eval  Vaginal   Internal Anal Sphincter   External Anal Sphincter   Puborectalis   Diastasis Recti   (Blank rows = not tested)        TONE: ***  PROLAPSE: ***  TODAY'S TREATMENT:                                                                                                                              DATE: ***  EVAL ***   PATIENT EDUCATION:  Education details: *** Person educated: Patient Education method: Consulting civil engineer, Demonstration, Corporate treasurer cues, Verbal cues, and Handouts Education comprehension: verbalized understanding  HOME EXERCISE PROGRAM: ***  ASSESSMENT:  CLINICAL IMPRESSION: Patient is a *** y.o. *** who was seen today for physical therapy evaluation and treatment for ***.   OBJECTIVE IMPAIRMENTS: {opptimpairments:25111}.   ACTIVITY LIMITATIONS: {activitylimitations:27494}  PARTICIPATION LIMITATIONS: {participationrestrictions:25113}  PERSONAL FACTORS: {Personal factors:25162} are  also affecting patient's functional outcome.   REHAB POTENTIAL: Good  CLINICAL DECISION MAKING: Stable/uncomplicated  EVALUATION COMPLEXITY: Low   GOALS: Goals reviewed with patient? Yes  SHORT TERM GOALS: Target date: 10/13/2022  Pt will be independent with HEP.   Baseline: Goal status: INITIAL  2.  *** Baseline:  Goal status: {GOALSTATUS:25110}  3.  *** Baseline:  Goal status: {GOALSTATUS:25110}  4.  *** Baseline:  Goal status:  {GOALSTATUS:25110}  5.  *** Baseline:  Goal status: {GOALSTATUS:25110}  6.  *** Baseline:  Goal status: {GOALSTATUS:25110}  LONG TERM GOALS: Target date: 12/08/2022   Pt will be independent with advanced HEP.   Baseline:  Goal status: INITIAL  2.  *** Baseline:  Goal status: {GOALSTATUS:25110}  3.  *** Baseline:  Goal status: {GOALSTATUS:25110}  4.  *** Baseline:  Goal status: {GOALSTATUS:25110}  5.  *** Baseline:  Goal status: {GOALSTATUS:25110}  6.  *** Baseline:  Goal status: {GOALSTATUS:25110}  PLAN:  PT FREQUENCY: {rehab frequency:25116}  PT DURATION: {rehab duration:25117}  PLANNED INTERVENTIONS: Therapeutic exercises, Therapeutic activity, Neuromuscular re-education, Balance training, Gait training, Patient/Family education, Self Care, Joint mobilization, Aquatic Therapy, Dry Needling, Biofeedback, and Manual therapy  PLAN FOR NEXT SESSION: ***  Heather Roberts, PT, DPT11/04/2310:42 AM

## 2022-09-15 NOTE — Progress Notes (Signed)
Emily Joseph 51 y.o. [birth year] [id number]  Assessment & Plan:   Encounter Diagnoses  Name Primary?   Chronic constipation Yes   Diastasis recti    External hemorrhoid    Rectocele     I believe she has a functional disturbance perhaps autonomic driven disorder.  I explained to help people have these problems but they are beyond the limits of test detection that is available in the current state.  Support fibromyalgia diagnosis, IBS goes along with that, and she may have some sort of underlying autonomic dysfunction contributing.  Do not think we need to perform colonoscopy earlier than planned routinely in April of next year for screening purposes.  These are chronic symptoms with exacerbation.  She will continue to use senna compounds and MiraLAX, I have encouraged her not to go 2 or 3 days without defecation and apparently she is going daily at this point.  It still struggle so she had previously had some pelvic floor physical therapy related to urologic issues and we will try that again with a focus on bowel habits and she is interested in help with small diastases recti as well.  She will return in the office as needed and we will plan on a routine colonoscopy next year, sooner if something changes.  CC: Kuneff, Renee A, DO   Subjective:   Prior GI work-up notes:  Distal esophageal web dilation to 18 mm October 2022 Biopsies to check for recurrence of eosinophilic esophagitis were negative.  Originally diagnosed 2014.  Colonoscopy 2014 Dr. Paulita Fujita normal  Celiac antibodies -2017 (TTG antibody IgA)  Cholecystectomy January 2021 for gallstones negative cholangiogram.  Chronic right upper quadrant pain without clear etiology  Chronic constipation/IBS type problems  Chief Complaint: Bloating and constipation, right upper quadrant pain  HPI 51 year old white woman with a history of chronic constipation and IBS problems, EOE, chronic right upper quadrant pain,  osteoarthritis and fibromyalgia, and prior right atrial lipoma and a flutter (transient) here for follow-up because of bloating and constipation problems.  These are not new issues but they are a bit worse lately.  She relates using MiraLAX regularly but unfortunately that tends to bloat her more than anything but when she adds senna compounds to this either in the form of a stool softener plus senna or Senokot she can defecate.  She is actually defecating most days now but she is trying to treat this bloating and abdominal distress that is associated.  She is trying to figure out food sensitivities as well.  Her husband is having an ulcerative colitis flare and her daughter was diagnosed with Ehlers-Danlos syndrome.  The diagnosing physician suggested that [facility] may have some of these issues as well particularly since she mentions she has a diagnosis of autonomic dysfunction.  She continues with chronic pain in the right upper quadrant and joint pain as well that can be debilitating.  She has had a positive rheumatoid factor but no signs of rheumatoid arthritis per rheumatology who have diagnosed her with fibromyalgia.  She had tried duloxetine which was very helpful but she had visual symptoms with this right away interestingly she reports that the duloxetine helped her right away also, and had to discontinue.  She is taking Bromelia supplements for her joints and they are also helping heartburn.   She is trying her best to eat real food and avoiding ultra processed foods.  Trying to eat probiotic foods as well. Allergies  Allergen Reactions   Avocado Shortness Of Breath  and Nausea And Vomiting   Macadamia Nut Oil Hives and Swelling    & Walnuts. LIPS SWELL    Mango Flavor Swelling    SWELLING OF THE LIPS   Other Other (See Comments)    [name]   Ciprofloxacin Other (See Comments)    SEVERE  JOINT PAIN   Demerol [Meperidine] Other (See Comments)    HALLUCINATIONS    Iodinated Contrast Media Hives and Other (See Comments)    Patient had IVP @ age 89 and broke out in Hives (was once pre-treated with several meds)   Macrobid [Nitrofurantoin] Other (See Comments)    CHEST PAIN   Betadine [Povidone Iodine]     hives   Latex Swelling    Please use paper tape or nitrile gloves   Dulcolax [Bisacodyl] Palpitations    Felt light headed and dizzy   Sulfamethoxazole Nausea Only and Other (See Comments)    Childhood reaction - upset stomach   Tape Rash and Other (See Comments)    Family allergy; this is to be avoided; tolerates paper tape   Current Meds  Medication Sig   albuterol (VENTOLIN HFA) 108 (90 Base) MCG/ACT inhaler Inhale 2 puffs into the lungs as needed for wheezing or shortness of breath.   Azelastine-Fluticasone 137-50 MCG/ACT SUSP Place 1 spray into both nostrils 2 (two) times daily.   Bromelains (BROMELAIN PO) Take 1 tablet by mouth. Three-four times weekly   Brompheniramine-Pseudoeph (BROMALINE PO) Take by mouth daily.   cetirizine (ZYRTEC) 10 MG tablet Take 10 mg by mouth daily.   Cholecalciferol (VITAMIN D3) 125 MCG (5000 UT) TABS Take 5,000 Units by mouth daily.   fexofenadine (ALLEGRA ODT) 30 MG disintegrating tablet Take 30 mg by mouth daily.   Glycerin-Hypromellose-PEG 400 (DRY EYE RELIEF DROPS) 0.2-0.2-1 % SOLN Place 1 drop into both eyes daily.   ketotifen (ZADITOR) 0.025 % ophthalmic solution 1 drop 2 (two) times daily.   Methylcobalamin (METHYL B-12 PO) Take by mouth as needed.   metoprolol tartrate (LOPRESSOR) 25 MG tablet Take 0.5 tablets (12.5 mg total) by mouth 2 (two) times daily. (Patient taking differently: Take 12.5 mg by mouth as needed.)   Polyethyl Glycol-Propyl Glycol (SYSTANE OP) Place 1 drop into both eyes 2 (two) times daily.   polyethylene glycol (MIRALAX / GLYCOLAX) 17 g packet Take 17 g by mouth as needed.   Probiotic Product (PROBIOTIC  ADVANCED PO) Take 1 tablet by mouth 2 (two) times daily. Garden of life Children chewable   Wheat Dextrin (BENEFIBER) CHEW Chew 1-2 tablets by mouth at bedtime.   Past Medical History:  Diagnosis Date   Adenomyosis    not papanicolaou smear of cervix and cervical HPV   Allergy    Anxiety 06/08/2016   -about her health and about taking medications   Arthritis    ?   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 Trudie Reed in 2017, [place] rheum -seeing Dr. Jaynee Eagles, Neurologist for back pain with radicular symptoms   Complication of anesthesia    "with epidural bp bottoms out"   Dysrhythmia    fast hr   Eosinophilic esophagitis    Sees Dr.  Heavenly Christine   Fibromyalgia    GERD (gastroesophageal reflux disease)    H/O laminectomy 09/07/2020   Heart murmur    History of atrial flutter 10/2016   spontaneous conversion to sinus   History of gallstones    History of laparoscopic-assisted vaginal hysterectomy 09/07/2020   History of pneumonia    when pt was pregnant   History of pneumothorax 07/2016   Right   History of sinus tachycardia    Iron deficiency anemia due to chronic blood loss    Menorrhagia, s/p complete hysterectomy, ovaries remain hx   Irritable bowel syndrome 01/27/2013   Dr Carlean Purl 02/2016    Microhematuria    Mouth problem    failed gum graft 1/16   Nasal airway abnormality    Nasal obstruction 06/26/2015   Seen by ENT 06-26-15.  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   Shortness of breath dyspnea    SUI (stress urinary incontinence, female)    Syncope    with last child with epideral   UTI  (urinary tract infection) 07/14/2016   >=100,000 COLONIES/mL KLEBSIELLA PNEUMONIA   Vitamin D deficiency    Past Surgical History:  Procedure Laterality Date   APPENDECTOMY  01/1989   BALLOON DILATION N/A 02/09/2013   Procedure: BALLOON DILATION;  Surgeon: Arta Silence, MD;  [facility];  Service: Endoscopy;  Laterality: N/A;   BILATERAL SALPINGECTOMY N/A 06/14/2014   Procedure: BILATERAL SALPINGECTOMY;  Surgeon: Allena Katz, MD;  Location: [facility];  Service: Gynecology;  Laterality: N/A;   BIOPSY  09/04/2021   Procedure: BIOPSY;  Surgeon: Gatha Mayer, MD;  [facility];  Service: Endoscopy;;   BREAST BIOPSY Right    biopsy    CHOLECYSTECTOMY N/A 11/21/2019   Procedure: LAPAROSCOPIC CHOLECYSTECTOMY WITH INTRAOPERATIVE CHOLANGIOGRAM;  Surgeon: Alphonsa Overall, MD;  [facility];  Service: General;  Laterality: N/A;   COLONOSCOPY WITH PROPOFOL N/A 02/09/2013   Procedure: COLONOSCOPY WITH PROPOFOL;  Surgeon: Arta Silence, MD;  [facility];  Service: Endoscopy;  Laterality: N/A;  need ultra thin colon scope   ESOPHAGEAL DILATION  09/04/2021   Procedure: ESOPHAGEAL DILATION;  Surgeon: Gatha Mayer, MD;  [facility];  Service: Endoscopy;;   ESOPHAGOGASTRODUODENOSCOPY Left 06/30/2021   Procedure: ESOPHAGOGASTRODUODENOSCOPY (EGD);  Surgeon: Lavena Bullion, DO;  [facility];  Service: Gastroenterology;  Laterality: Left;   ESOPHAGOGASTRODUODENOSCOPY (EGD) WITH PROPOFOL N/A 02/09/2013   Procedure: ESOPHAGOGASTRODUODENOSCOPY (EGD) WITH PROPOFOL;  Surgeon: Arta Silence, MD;  [facility];  Service: Endoscopy;  Laterality: N/A;   ESOPHAGOGASTRODUODENOSCOPY (EGD) WITH PROPOFOL N/A 09/04/2021   Procedure: ESOPHAGOGASTRODUODENOSCOPY (EGD) WITH PROPOFOL;  Surgeon: Gatha Mayer, MD;  [facility];  Service: Endoscopy;  Laterality: N/A;   FOREIGN BODY REMOVAL  06/30/2021   Procedure: FOREIGN BODY REMOVAL;  Surgeon:  Lavena Bullion, DO;  Location: [facility];  Service: Gastroenterology;;   LAPAROSCOPIC ASSISTED VAGINAL HYSTERECTOMY Right 06/14/2014   Procedure: OPEN LAPAROSCOPIC ASSISTED VAGINAL HYSTERECTOMY;  Surgeon: Allena Katz, MD;  Location: [facility];  Service: Gynecology;  Laterality: Right;   LYSIS OF ADHESION N/A 06/14/2014   Procedure: LYSIS OF ADHESION;  Surgeon: Allena Katz, MD;  Location: [facility];  Service: Gynecology;  Laterality: N/A;   MINIMALLY  INVASIVE EXCISION OF ATRIAL MYXOMA Right 07/11/2016   Procedure: MINIMALLY INVASIVE RESECTION OF RIGHT ATRIAL LIPOMA WITH CLOSURE OF PATENT FORAMEN OVALE;  Surgeon: Rexene Alberts, MD;  Location: [facility];  Service: Open Heart Surgery;  Laterality: Right;   OVARIAN CYST REMOVAL Right 1990   TEE WITHOUT CARDIOVERSION N/A 07/11/2016   Procedure: TRANSESOPHAGEAL ECHOCARDIOGRAM (TEE);  Surgeon: Rexene Alberts, MD;  Location: [facility];  Service: Open Heart Surgery;  Laterality: N/A;   Social History   Social History Narrative   Social History:   Now stays at home -  feels can barely function just to keep house and take care of  4 children. Husband travels and works a lot.   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      [name] - husband; [phone] -daughter; [name]- son [name] 2003 daughter [name] 2008 daughter    Healthy diet - lots of food allergies. Avoiding gluten currently.      Of note, at age 46 she had a very traumatic medical experience. She apparently was in the hospital for some time and had many needlesticks, many CT scans and surgery for an ovarian mass. This was very traumatizing for her. She still gets anxiety when she is going to see a doctor or health care provider. She had a flashback to this time when she went for acupuncture.        -Wears a bicycle helmet riding a bike: Yes     -smoke alarm in the home:Yes     - wears seatbelt: Yes      - Feels safe in their relationships: Yes   family history includes Allergies in her daughter; Alport syndrome in her paternal grandfather; Breast cancer (age of onset: 79) in her mother; CAD in her father; Cancer in her father, maternal grandfather, and mother; Chiari malformation in her sister; Ehlers-Danlos syndrome in her daughter; Esophageal cancer in her paternal uncle; Goiter in her maternal grandmother; Healthy in her daughter and son; Hematuria in her sister; Kidney disease in her paternal grandfather; Lung cancer in her maternal grandfather and mother; Myasthenia gravis in her sister; Polycystic ovary syndrome in her daughter; Rheum arthritis in her paternal grandmother; Stroke in her father and paternal grandfather.   Review of Systems As above  Objective:   Physical Exam '@BP'$  112/68 (BP Location: Left Arm, Patient Position: Sitting, Cuff Size: Normal)   Pulse 88   Ht 5' 7.5" (1.715 m) Comment: height measured without shoes  Wt 166 lb (75.3 kg)   LMP 05/20/2014 (Approximate)   BMI 25.62 kg/m @  General:  NAD Eyes:   anicteric Lungs:  clear Heart::  S1S2 no rubs, murmurs or gallops Abdomen:  soft and nontender, BS+ - mild upper abd diastsis recti Ext:   no edema, cyanosis or clubbing  Rectal: Anoderm inspection revealed small RA external hemorrhoid  Digital exam revealed normal resting tone and reduced voluntary squeeze. Small rectocele present. No mass Simulated defecation with valsalva revealed appropriate abdominal contraction with mildly reduced  descent and decreased relaxation       Data Reviewed:  See HPI

## 2022-09-15 NOTE — Patient Instructions (Signed)
We have referred you for pelvic floor physical therapy. They will contact you about an appointment.  Use over the counter Recticare as needed. Sample and coupon provided today.   Due to recent changes in healthcare laws, you may see the results of your imaging and laboratory studies on MyChart before your provider has had a chance to review them.  We understand that in some cases there may be results that are confusing or concerning to you. Not all laboratory results come back in the same time frame and the provider may be waiting for multiple results in order to interpret others.  Please give [place] 48 hours in order for your provider to thoroughly review all the results before contacting the office for clarification of your results.    I appreciate the opportunity to care for you. Silvano Rusk, MD, [facility]

## 2022-09-22 NOTE — Progress Notes (Signed)
Appointment cancelled - please disregard. 

## 2022-09-29 ENCOUNTER — Ambulatory Visit: Payer: BC Managed Care – PPO | Attending: Internal Medicine | Admitting: Physical Therapy

## 2022-09-29 ENCOUNTER — Other Ambulatory Visit: Payer: Self-pay

## 2022-09-29 DIAGNOSIS — R279 Unspecified lack of coordination: Secondary | ICD-10-CM

## 2022-09-29 DIAGNOSIS — M6281 Muscle weakness (generalized): Secondary | ICD-10-CM

## 2022-09-29 DIAGNOSIS — R293 Abnormal posture: Secondary | ICD-10-CM | POA: Diagnosis present

## 2022-09-29 NOTE — Therapy (Signed)
OUTPATIENT PHYSICAL THERAPY FEMALE PELVIC EVALUATION   Patient Name: Emily Joseph MRN: [id number] DOB:1971-03-25, 51 y.o., female Today's Date: 09/29/2022  END OF SESSION:  PT End of Session - 09/29/22 1101     Visit Number 1    Date for PT Re-Evaluation 12/30/22    Authorization Type BCBS    PT Start Time 1100    PT Stop Time 1144    PT Time Calculation (min) 44 min    Activity Tolerance Patient tolerated treatment well    Behavior During Therapy WFL for tasks assessed/performed             Past Medical History:  Diagnosis Date   Adenomyosis    not papanicolaou smear of cervix and cervical HPV   Allergy    Anxiety 06/08/2016   -about her health and about taking medications   Arthritis    ?   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 Trudie Reed in 2017, [place] [facility] -seeing Dr. Jaynee Eagles, Neurologist for back pain with radicular symptoms   Complication of anesthesia    "with epidural bp bottoms out"   Dysrhythmia    fast hr   Eosinophilic esophagitis    Sees Dr. Carlean Purl   Fibromyalgia    GERD (gastroesophageal reflux disease)    H/O laminectomy 09/07/2020   Heart murmur    History of atrial flutter 10/2016   spontaneous conversion to sinus   History of gallstones    History of laparoscopic-assisted vaginal hysterectomy 09/07/2020   History of pneumonia    when pt was pregnant   History of pneumothorax 07/2016   Right   History of sinus tachycardia    Iron deficiency anemia due to chronic blood loss    Menorrhagia, s/p complete hysterectomy, ovaries remain hx   Irritable bowel syndrome 01/27/2013   Dr Carlean Purl 02/2016    Microhematuria    Mouth problem    failed gum graft 1/16   Nasal airway abnormality    Nasal obstruction 06/26/2015   Seen by ENT  06-26-15.  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   Shortness of breath dyspnea    SUI (stress urinary incontinence, female)    Syncope    with last child with epideral   UTI (urinary tract infection) 07/14/2016   >=100,000 COLONIES/mL KLEBSIELLA PNEUMONIA   Vitamin D deficiency    Past Surgical History:  Procedure Laterality Date   APPENDECTOMY  01/1989   BALLOON DILATION N/A 02/09/2013   Procedure: BALLOON DILATION;  Surgeon: Arta Silence, MD;  Location: [facility];  Service: Endoscopy;  Laterality: N/A;   BILATERAL SALPINGECTOMY N/A 06/14/2014   Procedure: BILATERAL SALPINGECTOMY;  Surgeon: Allena Katz, MD;  Location: [facility];  Service: Gynecology;  Laterality: N/A;   BIOPSY  09/04/2021   Procedure: BIOPSY;  Surgeon: Gatha Mayer, MD;  Location: [facility];  Service: Endoscopy;;   BREAST BIOPSY Right    biopsy    CHOLECYSTECTOMY N/A 11/21/2019   Procedure: LAPAROSCOPIC CHOLECYSTECTOMY WITH INTRAOPERATIVE CHOLANGIOGRAM;  Surgeon: Lucia Gaskins,  Shanon Brow, MD;  Location: [facility];  Service: General;  Laterality: N/A;   COLONOSCOPY WITH PROPOFOL N/A 02/09/2013   Procedure: COLONOSCOPY WITH PROPOFOL;  Surgeon: Arta Silence, MD;  Location: [facility];  Service: Endoscopy;  Laterality: N/A;  need ultra thin colon scope   ESOPHAGEAL DILATION  09/04/2021   Procedure: ESOPHAGEAL DILATION;  Surgeon: Gatha Mayer, MD;  Location: [facility];  Service: Endoscopy;;   ESOPHAGOGASTRODUODENOSCOPY Left 06/30/2021   Procedure: ESOPHAGOGASTRODUODENOSCOPY (EGD);  Surgeon: Lavena Bullion, DO;  Location: [facility];  Service: Gastroenterology;  Laterality: Left;   ESOPHAGOGASTRODUODENOSCOPY (EGD) WITH PROPOFOL N/A 02/09/2013   Procedure: ESOPHAGOGASTRODUODENOSCOPY (EGD) WITH PROPOFOL;  Surgeon: Arta Silence, MD;  Location: [facility];  Service: Endoscopy;  Laterality: N/A;   ESOPHAGOGASTRODUODENOSCOPY (EGD) WITH PROPOFOL N/A 09/04/2021   Procedure: ESOPHAGOGASTRODUODENOSCOPY (EGD) WITH PROPOFOL;  Surgeon: Gatha Mayer, MD;  Location: [facility];  Service: Endoscopy;  Laterality: N/A;   FOREIGN BODY REMOVAL  06/30/2021   Procedure: FOREIGN BODY REMOVAL;  Surgeon: Lavena Bullion, DO;  Location: [facility];  Service: Gastroenterology;;   LAPAROSCOPIC ASSISTED VAGINAL HYSTERECTOMY Right 06/14/2014   Procedure: OPEN LAPAROSCOPIC ASSISTED VAGINAL HYSTERECTOMY;  Surgeon: Allena Katz, MD;  Location: [facility];  Service: Gynecology;  Laterality: Right;   LYSIS OF ADHESION N/A 06/14/2014   Procedure: LYSIS OF ADHESION;  Surgeon: Allena Katz, MD;  Location: [facility];  Service: Gynecology;  Laterality: N/A;   MINIMALLY INVASIVE EXCISION OF ATRIAL MYXOMA Right 07/11/2016   Procedure: MINIMALLY INVASIVE RESECTION OF RIGHT ATRIAL LIPOMA WITH CLOSURE OF PATENT FORAMEN OVALE;  Surgeon: Rexene Alberts, MD;  Location: [facility];  Service: Open Heart Surgery;  Laterality: Right;   OVARIAN CYST REMOVAL Right 1990   TEE WITHOUT CARDIOVERSION N/A 07/11/2016   Procedure: TRANSESOPHAGEAL ECHOCARDIOGRAM (TEE);  Surgeon: Rexene Alberts, MD;  Location: [facility];  Service: Open Heart Surgery;  Laterality: N/A;   Patient Active Problem List   Diagnosis Date Noted   Pain in right hip 03/12/2022   Esophageal web    Female stress incontinence 09/07/2020   Heart disease 09/07/2020   Primary osteoarthritis of left knee 05/31/2020   Pelvic floor dysfunction 12/28/2018   Chronic RUQ pain - exacerbation 12/28/2018   Atrial flutter with rapid ventricular response ([place]) 10/27/2016   s/p minimally invasive resection of right atrial lipoma    Chronic pain 06/08/2016   Chronic fatigue 06/08/2016   GERD (gastroesophageal reflux disease) with esophagitis 06/08/2016   Palpitations 06/08/2016   Anxiety 06/08/2016   Multiple environmental  allergies 03/04/2016   Multiple food allergies 03/04/2016   Irritable bowel syndrome with constipation 01/27/2013   Fibromyalgia 01/07/2007    PCP: Ma Hillock, DO  REFERRING PROVIDER: Gatha Mayer, MD   REFERRING DIAG: K59.09 (ICD-10-CM) - Chronic constipation  Rationale for Evaluation and Treatment: Rehabilitation  THERAPY DIAG:  Muscle weakness (generalized)  Abnormal posture  Unspecified lack of coordination  ONSET DATE: chronic  SUBJECTIVE:  SUBJECTIVE STATEMENT: Pt reports she had PFPT before in 2017 but halted this because she needed to have lipoma removed from heart. Pt reports she feels her constipation, hip pain, and urinary leakage is made worse by DRA separation.    PERTINENT HISTORY:  Constipation, DRA, external hemorrhoid, rectocele, OA, fibromyalgia, prior Rt atrial lipoma and flutter, reports she has had Rt ovary cyst removed as a teenager, gallbladder removed 2021, LAVH 2014   Fluid intake: Yes: water - 80-100oz water per day, one cup coffee in morning.     PAIN:  Are you having pain? Yes NPRS scale: 5/10 (globally, all joints today),  Pain location:  bil hip pain is usually the worst pain. Does have current Rt lipoma on hip.   Pain type: aching and tight Pain description: constant   Aggravating factors: lifting laundry, twisting, stress, driving Relieving factors: squatting helps with stretching  PRECAUTIONS: None  WEIGHT BEARING RESTRICTIONS: No  FALLS:  Has patient fallen in last 6 months? 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   PLOF: Independent  PATIENT GOALS: to have less pain, more regular bowels, strengthening DRA   Sexual abuse:  No  BOWEL MOVEMENT: Pain with bowel movement: Yes - with hemorrhoids  Type of bowel movement:Type (Bristol Stool Scale) 2-6, Frequency daily  to every two days, and Strain Yes ( a little)  Fully empty rectum: No not all the time Leakage: No Pads: No Fiber supplement: Yes: uses miralax or softener if needed but not daily   URINATION: Pain with urination: No Fully empty bladder: No not always Stream: Strong and Weak Urgency: Yes: sometimes with increased water intake Frequency: not more than every 2 hours Leakage: Coughing and Sneezing only strongly, very small amount  Pads: No  INTERCOURSE: Pain with intercourse: During Penetration and sometimes makes right hip pain worse Ability to have vaginal penetration:  Yes:   Climax: able to climax without pain  Marinoff Scale: 2/3 Menopause has started and does have a lot dryness   PREGNANCY: Vaginal deliveries 4 Tearing No  but episiotomy with first C-section deliveries 0 Currently pregnant No  PROLAPSE: None   OBJECTIVE:   DIAGNOSTIC FINDINGS:    COGNITION: Overall cognitive status: Within functional limits for tasks assessed     SENSATION: Light touch: Deficits fingers and feet tingling Proprioception: Appears intact  MUSCLE LENGTH: Bil hamstrings and adductors limited blue bandy 25%     POSTURE: rounded shoulders, forward head, and posterior pelvic tilt   LUMBARAROM/PROM:  A/PROM A/PROM  eval  Flexion WFL  Extension WFL  Right lateral flexion Limited by 25%  Left lateral flexion Limited by 25%  Right rotation WFL  Left rotation WFL   (Blank rows = not tested)  LOWER EXTREMITY ROM:  WFL  LOWER EXTREMITY MMT:  RT hips and adductors limited by 50% Lt hips and adductors limited by 25%  PALPATION:   General  TTP throughout Rt abdominal quadrants with noted fascial restrictions, Rt external obliques, Rt internal obliques, Rt psoas and with palpation in Rt abdomen pt reports she feels her Rt hip pain  replicated                External Perineal Exam deferred until next visit                             Internal Pelvic Floor deferred  until next visit  Patient confirms identification and approves PT to assess internal pelvic floor and treatment No  PELVIC MMT:   MMT eval  Vaginal   Internal Anal Sphincter   External Anal Sphincter   Puborectalis   Diastasis Recti Coning noted above umbilicus with active abdominal shoulder lift.    (Blank rows = not tested)        TONE: deferred until next visit  PROLAPSE: deferred until next visit  TODAY'S TREATMENT:                                                                                                                               09/29/22 EVAL Examination completed, findings reviewed, pt educated on POC, HEP, and abdominal massage, balloon breathing, feminine moisturizers. Pt motivated to participate in PT and agreeable to attempt recommendations.      PATIENT EDUCATION:  Education details: [id number], abdominal massage, moisturizers Person educated: Patient Education method: Explanation, Demonstration, Tactile cues, Verbal cues, and Handouts Education comprehension: verbalized understanding and returned demonstration  HOME EXERCISE PROGRAM: [id number]  ASSESSMENT:  CLINICAL IMPRESSION: Patient is a 51 y.o. female  who was seen today for physical therapy evaluation and treatment for chronic constipation, DRA separation, bil hip pain (worse at right), urinary leakage intermittently, vaginal dryness and pain with vaginal penetration. Pt found to have abdominal coning with shoulder lift off table. Pt had mild decreased flexibility in spine and hips, decreased hip and core strength, TTP throughout Rt abdominal and flank regions and Rt lateral and anterior hip. No posterior hip pain or SIJ pain. Pt would benefit from additional PT to further address deficits.    OBJECTIVE IMPAIRMENTS: decreased coordination, decreased endurance,  decreased mobility, decreased strength, increased fascial restrictions, increased muscle spasms, impaired flexibility, improper body mechanics, postural dysfunction, and pain.   ACTIVITY LIMITATIONS: carrying, lifting, sitting, squatting, continence, and locomotion level  PARTICIPATION LIMITATIONS: interpersonal relationship and community activity  PERSONAL FACTORS: Fitness, Time since onset of injury/illness/exacerbation, and 1 comorbidity: medical history  are also affecting patient's functional outcome.   REHAB POTENTIAL: Good  CLINICAL DECISION MAKING: Stable/uncomplicated  EVALUATION COMPLEXITY: Low   GOALS: Goals reviewed with patient? Yes  SHORT TERM GOALS: Target date: 10/27/22  Pt to be I with HEP. Baseline: Goal status: INITIAL  2.  Pt will report no  more than 4/10 pain due to improvements in posture, strength, and muscle length  Baseline:  Goal status: INITIAL  3.  Pt to be I with voiding mechanics and breathing mechanics to decrease strain at pelvic floor and improve bowel and bladder habits.  Baseline:  Goal status: INITIAL  4.  Pt to be I with abdominal massage for improved bowel habits.  Baseline:  Goal status: INITIAL  LONG TERM GOALS: Target date: 12/30/21  Pt to be I with advanced HEP.  Baseline:  Goal status: INITIAL  2.  Pt to demonstrate at least 5/5 bil hip strength for improved pelvic stability and  functional squats without leakage.  Baseline:  Goal status: INITIAL  3. Pt will report 2/10pain due to improvements in posture, strength, and muscle length  Baseline:  Goal status: INITIAL  4.  Pt will report type 3-4 on average for bowel movements based on stool chart for improved consistency and regularity.  Baseline:  Goal status: INITIAL  5.  Pt to demonstrate minimal to no abdominal coning with core strengthening exercises for squatting for improved load transfer and decreased compensatory strategies.  Baseline:  Goal status:  INITIAL   PLAN:  PT FREQUENCY: 1x/week  PT DURATION:  8 sessions  PLANNED INTERVENTIONS: Therapeutic exercises, Therapeutic activity, Neuromuscular re-education, Patient/Family education, Self Care, Joint mobilization, Aquatic Therapy, Dry Needling, Spinal mobilization, Cryotherapy, Moist heat, scar mobilization, Taping, Biofeedback, and Manual therapy  PLAN FOR NEXT SESSION: manual externally, internal assessment if pt consents, hip and core strengthening, hip and spine stretching, breathing and lifting mechanics    Stacy Gardner, PT, DPT 11/20/232:06 PM

## 2022-09-29 NOTE — Patient Instructions (Addendum)
BALLOON BREATHING: make an open fist, bring to mouth to blow out like a balloon instead of straining.   Moisturizers They are used in the vagina to hydrate the mucous membrane that make up the vaginal canal. Designed to keep a more normal acid balance (ph) Once placed in the vagina, it will last between two to three days.  Use 2-3 times per week at bedtime  Ingredients to avoid is glycerin and fragrance, can increase chance of infection Should not be used just before sex due to causing irritation Most are gels administered either in a tampon-shaped applicator or as a vaginal suppository. They are non-hormonal.   Types of Moisturizers(internal use)  Vitamin E vaginal suppositories- Whole foods, Amazon Moist Again Coconut oil- can break down condoms Julva- (Do no use if on Tamoxifen) amazon Yes moisturizer- amazon NeuEve Silk , NeuEve Silver for menopausal or over 65 (if have severe vaginal atrophy or cancer treatments use NeuEve Silk for  1 month than move to [employer]epsi)- [employer], [place].com Olive and Bee intimate cream- www.oliveandbee.com.au Mae vaginal moisturizer- Amazon Aloe Good Clean Love Hyaluronic acid Hyalofemme replens   Creams to use externally on the Vulva area Albertson's (good for for cancer patients that had radiation to the area)- [place] or [employer].FlyingBasics.com.br V-magic cream - amazon Julva-amazon Vital "V [place] salve ( help moisturize and help with thinning vulvar area, does have Opa-locka by Irwin Brakeman labial moisturizer (Amazon,  Coconut or olive oil aloe Good Clean Love Enchanted Rose by intimate rose  Things to avoid in the vaginal area Do not use things to irritate the vulvar area No lotions just specialized creams for the vulva area- Neogyn, V-magic, No soaps; can use Aveeno or Calendula cleanser if needed. Must be gentle No deodorants No douches Good to  sleep without underwear to let the vaginal area to air out No scrubbing: spread the lips to let warm water rinse over labias and pat dry  Lubrication Used for intercourse to reduce friction Avoid ones that have glycerin, nonoxynol-9, petroleum, propylene glycol, chlorhexidine gluconate, warming gels, tingling gels, icing or cooling gel, scented Avoid parabens due to a preservative similar to female sex hormone May need to be reapplied once or several times during sexual activity Can be applied to both partners genitals prior to vaginal penetration to minimize friction or irritation Prevent irritation and mucosal tears that cause post coital pain and increased the risk of vaginal and urinary tract infections Oil-based lubricants cannot be used with condoms due to breaking them down.  Least likely to irritate vaginal tissue.  Plant based-lubes are safe Silicone-based lubrication are thicker and last long and used for post-menopausal women  Vaginal Lubricators Here is a list of some suggested lubricators you can use for intercourse. Use the most hypoallergenic product.  You can place on you or your partner.  Slippery Stuff ( water based) Sylk or Sliquid Natural H2O ( good  if frequent UTI's)- walmart, amazon Sliquid organics silk-(aloe and silicone based ) [employer] (www.blossom-organics.com)- (aloe based ) Coconut oil, olive oil -not good with condoms  PJur Woman Nude- (water based) amazon Uberlube- ( silicon) [place] has an organic one Yes lubricant- (water based and has plant oil based similar to silicone) [employer] Platinum-Silicone, Target, [facility] Olive and Bee intimate cream-  www.oliveandbee.com.au Pink - [employer] Erosense Sync- walmart, amazon Coconu- coconu.com Desert [employer] to avoid in lubricants  are glycerin, warming gels, tingling gels, icing or cooling  gels, and scented gels.  Also avoid Vaseline. KY jelly,  and Astroglide  contain chlorhexidine which kills good bacteria(lactobacilli)  Things to avoid in the vaginal area Do not use things to irritate the vulvar area No lotions- see below Soaps you  can use :Aveeno, Calendula, Good Clean Love cleanser if needed. Must be gentle No deodorants No douches Good to sleep without underwear to let the vaginal area to air out No scrubbing: spread the lips to let warm water rinse over labias and pat dry  Creams that can be used on the Vulva Area V [employer], walmart Vital V Wild Yam Salve Julva- [employer] Botanical Pro-Meno Wild Yam Cream Coconut oil, olive oil Cleo by [employer] labial moisturizer -[place],  [place] Releveum ( lidocaine) or Desert [employer] Yes Moisturizer

## 2022-10-13 ENCOUNTER — Ambulatory Visit (INDEPENDENT_AMBULATORY_CARE_PROVIDER_SITE_OTHER): Payer: BC Managed Care – PPO | Admitting: Family Medicine

## 2022-10-13 ENCOUNTER — Encounter: Payer: Self-pay | Admitting: Family Medicine

## 2022-10-13 VITALS — BP 96/63 | HR 99 | Temp 98.4°F | Ht 67.0 in | Wt 166.0 lb

## 2022-10-13 DIAGNOSIS — Z79899 Other long term (current) drug therapy: Secondary | ICD-10-CM

## 2022-10-13 DIAGNOSIS — Z0000 Encounter for general adult medical examination without abnormal findings: Secondary | ICD-10-CM

## 2022-10-13 DIAGNOSIS — R002 Palpitations: Secondary | ICD-10-CM | POA: Diagnosis not present

## 2022-10-13 DIAGNOSIS — F419 Anxiety disorder, unspecified: Secondary | ICD-10-CM | POA: Diagnosis not present

## 2022-10-13 LAB — LIPID PANEL
Cholesterol: 163 mg/dL (ref 0–200)
HDL: 46.6 mg/dL (ref >39.00)
LDL Cholesterol: 86 mg/dL (ref 0–99)
NonHDL: 116.01
Total CHOL/HDL Ratio: 3
Triglycerides: 151 mg/dL — ABNORMAL HIGH (ref 0.0–149.0)
VLDL: 30.2 mg/dL (ref 0.0–40.0)

## 2022-10-13 LAB — HEMOGLOBIN A1C: Hgb A1c MFr Bld: 5.6 % (ref 4.6–6.5)

## 2022-10-13 LAB — TSH: TSH: 1.17 u[IU]/mL (ref 0.35–5.50)
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
## 2022-10-13 NOTE — Patient Instructions (Addendum)
Return in about 1 year (around 10/15/2023) for cpe (20 min).        Great to see you today.  I have refilled the medication(s) we provide.   If labs were collected, we will inform you of lab results once received either by echart message or telephone call.   - echart message- for normal results that have been seen by the patient already.   - telephone call: abnormal results or if patient has not viewed results in their echart.  Health Maintenance, Female Adopting a healthy lifestyle and getting preventive care are important in promoting health and wellness. Ask your health care provider about: The right schedule for you to have regular tests and exams. Things you can do on your own to prevent diseases and keep yourself healthy. What should I know about diet, weight, and exercise? Eat a healthy diet  Eat a diet that includes plenty of vegetables, fruits, low-fat dairy products, and lean protein. Do not eat a lot of foods that are high in solid fats, added sugars, or sodium. Maintain a healthy weight Body mass index (BMI) is used to identify weight problems. It estimates body fat based on height and weight. Your health care provider can help determine your BMI and help you achieve or maintain a healthy weight. Get regular exercise Get regular exercise. This is one of the most important things you can do for your health. Most adults should: Exercise for at least 150 minutes each week. The exercise should increase your heart rate and make you sweat (moderate-intensity exercise). Do strengthening exercises at least twice a week. This is in addition to the moderate-intensity exercise. Spend less time sitting. Even light physical activity can be beneficial. Watch cholesterol and blood lipids Have your blood tested for lipids and cholesterol at 51 years of age, then have this test every 5 years. Have your cholesterol levels checked more often if: Your lipid or cholesterol levels are  high. You are older than 51 years of age. You are at high risk for heart disease. What should I know about cancer screening? Depending on your health history and family history, you may need to have cancer screening at various ages. This may include screening for: Breast cancer. Cervical cancer. Colorectal cancer. Skin cancer. Lung cancer. What should I know about heart disease, diabetes, and high blood pressure? Blood pressure and heart disease High blood pressure causes heart disease and increases the risk of stroke. This is more likely to develop in people who have high blood pressure readings or are overweight. Have your blood pressure checked: Every 3-5 years if you are 8-66 years of age. Every year if you are 38 years old or older. Diabetes Have regular diabetes screenings. This checks your fasting blood sugar level. Have the screening done: Once every three years after age 82 if you are at a normal weight and have a low risk for diabetes. More often and at a younger age if you are overweight or have a high risk for diabetes. What should I know about preventing infection? Hepatitis B If you have a higher risk for hepatitis B, you should be screened for this virus. Talk with your health care provider to find out if you are at risk for hepatitis B infection. Hepatitis C Testing is recommended for: Everyone born from [birth year] through 1965. Anyone with known risk factors for hepatitis C. Sexually transmitted infections (STIs) Get screened for STIs, including gonorrhea and chlamydia, if: You are sexually active and are younger  than 51 years of age. You are older than 51 years of age and your health care provider tells you that you are at risk for this type of infection. Your sexual activity has changed since you were last screened, and you are at increased risk for chlamydia or gonorrhea. Ask your health care provider if you are at risk. Ask your health care provider about whether you  are at high risk for HIV. Your health care provider may recommend a prescription medicine to help prevent HIV infection. If you choose to take medicine to prevent HIV, you should first get tested for HIV. You should then be tested every 3 months for as long as you are taking the medicine. Pregnancy If you are about to stop having your period (premenopausal) and you may become pregnant, seek counseling before you get pregnant. Take 400 to 800 micrograms (mcg) of folic acid every day if you become pregnant. Ask for birth control (contraception) if you want to prevent pregnancy. Osteoporosis and menopause Osteoporosis is a disease in which the bones lose minerals and strength with aging. This can result in bone fractures. If you are 33 years old or older, or if you are at risk for osteoporosis and fractures, ask your health care provider if you should: Be screened for bone loss. Take a calcium or vitamin D supplement to lower your risk of fractures. Be given hormone replacement therapy (HRT) to treat symptoms of menopause. Follow these instructions at home: Alcohol use Do not drink alcohol if: Your health care provider tells you not to drink. You are pregnant, may be pregnant, or are planning to become pregnant. If you drink alcohol: Limit how much you have to: 0-1 drink a day. Know how much alcohol is in your drink. In the [place] one drink equals one 12 oz bottle of beer (355 mL), one 5 oz glass of wine (148 mL), or one 1 oz glass of hard liquor (44 mL). Lifestyle Do not use any products that contain nicotine or tobacco. These products include cigarettes, chewing tobacco, and vaping devices, such as e-cigarettes. If you need help quitting, ask your health care provider. Do not use street drugs. Do not share needles. Ask your health care provider for help if you need support or information about quitting drugs. General instructions Schedule regular health, dental, and eye exams. Stay current  with your vaccines. Tell your health care provider if: You often feel depressed. You have ever been abused or do not feel safe at home. Summary Adopting a healthy lifestyle and getting preventive care are important in promoting health and wellness. Follow your health care provider's instructions about healthy diet, exercising, and getting tested or screened for diseases. Follow your health care provider's instructions on monitoring your cholesterol and blood pressure. This information is not intended to replace advice given to you by your health care provider. Make sure you discuss any questions you have with your health care provider. Document Revised: 03/18/2021 Document Reviewed: 03/18/2021 Elsevier Patient Education  [place].

## 2022-10-23 ENCOUNTER — Ambulatory Visit: Payer: BC Managed Care – PPO | Attending: Internal Medicine | Admitting: Physical Therapy

## 2022-10-23 DIAGNOSIS — R279 Unspecified lack of coordination: Secondary | ICD-10-CM

## 2022-10-23 DIAGNOSIS — M6281 Muscle weakness (generalized): Secondary | ICD-10-CM | POA: Diagnosis present

## 2022-10-23 DIAGNOSIS — R293 Abnormal posture: Secondary | ICD-10-CM

## 2022-10-23 NOTE — Therapy (Signed)
OUTPATIENT PHYSICAL THERAPY FEMALE PELVIC TREATMENT   Patient Name: Emily Joseph MRN: [id number] DOB:[birth year], 51 y.o., female Today's Date: 10/23/2022  END OF SESSION:  PT End of Session - 10/23/22 1145     Visit Number 2    Date for PT Re-Evaluation 12/30/22    Authorization Type [employer]    PT Start Time 1145    PT Stop Time 1223    PT Time Calculation (min) 38 min    Activity Tolerance Patient tolerated treatment well    Behavior During Therapy [facility] for tasks assessed/performed             Past Medical History:  Diagnosis Date   Adenomyosis    not papanicolaou smear of cervix and cervical HPV   Allergy    Anxiety 06/08/2016   -about her health and about taking medications   Arthritis    ?   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 Trudie Reed in 2017, [facility] -seeing Dr. Jaynee Eagles, Neurologist for back pain with radicular symptoms   Complication of anesthesia    "with epidural bp bottoms out"   Dysrhythmia    fast hr   Eosinophilic esophagitis    Sees Dr. Carlean Purl   Fibromyalgia    GERD (gastroesophageal reflux disease)    H/O laminectomy 09/07/2020   Heart murmur    History of atrial flutter 10/2016   spontaneous conversion to sinus   History of gallstones    History of laparoscopic-assisted vaginal hysterectomy 09/07/2020   History of pneumonia    when pt was pregnant   History of pneumothorax 07/2016   Right   History of sinus tachycardia    Iron deficiency anemia due to chronic blood loss    Menorrhagia, s/p complete hysterectomy, ovaries remain hx   Irritable bowel syndrome 01/27/2013   Dr Carlean Purl 02/2016    Microhematuria    Mouth problem    failed gum graft 1/16   Nasal airway abnormality    Nasal obstruction 06/26/2015   Seen by ENT  06-26-15.  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   Shortness of breath dyspnea    SUI (stress urinary incontinence, female)    Syncope    with last child with epideral   UTI (urinary tract infection) 07/14/2016   >=100,000 COLONIES/mL KLEBSIELLA PNEUMONIA   Vitamin D deficiency    Past Surgical History:  Procedure Laterality Date   APPENDECTOMY  01/1989   BALLOON DILATION N/A 02/09/2013   Procedure: BALLOON DILATION;  Surgeon: Arta Silence, MD;  Location: [facility];  Service: Endoscopy;  Laterality: N/A;   BILATERAL SALPINGECTOMY N/A 06/14/2014   Procedure: BILATERAL SALPINGECTOMY;  Surgeon: Allena Katz, MD;  Location: [facility];  Service: Gynecology;  Laterality: N/A;   BIOPSY  09/04/2021   Procedure: BIOPSY;  Surgeon: Gatha Mayer, MD;  Location: [facility];  Service: Endoscopy;;   BREAST BIOPSY Right    biopsy    CHOLECYSTECTOMY N/A 11/21/2019   Procedure: LAPAROSCOPIC CHOLECYSTECTOMY WITH INTRAOPERATIVE CHOLANGIOGRAM;  Surgeon: Lucia Gaskins,  Shanon Brow, MD;  Location: [facility];  Service: General;  Laterality: N/A;   COLONOSCOPY WITH PROPOFOL N/A 02/09/2013   Procedure: COLONOSCOPY WITH PROPOFOL;  Surgeon: Arta Silence, MD;  Location: [facility];  Service: Endoscopy;  Laterality: N/A;  need ultra thin colon scope   ESOPHAGEAL DILATION  09/04/2021   Procedure: ESOPHAGEAL DILATION;  Surgeon: Gatha Mayer, MD;  Location: [facility];  Service: Endoscopy;;   ESOPHAGOGASTRODUODENOSCOPY Left 06/30/2021   Procedure: ESOPHAGOGASTRODUODENOSCOPY (EGD);  Surgeon: Lavena Bullion, DO;  Location[facility];  Service: Gastroenterology;  Laterality: Left;   ESOPHAGOGASTRODUODENOSCOPY (EGD) WITH PROPOFOL N/A 02/09/2013   Procedure: ESOPHAGOGASTRODUODENOSCOPY (EGD) WITH PROPOFOL;  Surgeon: Arta Silence, MD;  Location: [facility];  Service: Endoscopy;  Laterality: N/A;   ESOPHAGOGASTRODUODENOSCOPY (EGD) WITH PROPOFOL N/A 09/04/2021   Procedure: ESOPHAGOGASTRODUODENOSCOPY (EGD) WITH PROPOFOL;  Surgeon: Gatha Mayer, MD;  Location: [facility];  Service: Endoscopy;  Laterality: N/A;   FOREIGN BODY REMOVAL  06/30/2021   Procedure: FOREIGN BODY REMOVAL;  Surgeon: Lavena Bullion, DO;  Location: [place] [facility];  Service: Gastroenterology;;   LAPAROSCOPIC ASSISTED VAGINAL HYSTERECTOMY Right 06/14/2014   Procedure: OPEN LAPAROSCOPIC ASSISTED VAGINAL HYSTERECTOMY;  Surgeon: Allena Katz, MD;  Location: [place] [facility];  Service: Gynecology;  Laterality: Right;   LYSIS OF ADHESION N/A 06/14/2014   Procedure: LYSIS OF ADHESION;  Surgeon: Allena Katz, MD;  Location: [place] [facility];  Service: Gynecology;  Laterality: N/A;   MINIMALLY INVASIVE EXCISION OF ATRIAL MYXOMA Right 07/11/2016   Procedure: MINIMALLY INVASIVE RESECTION OF RIGHT ATRIAL LIPOMA WITH CLOSURE OF PATENT FORAMEN OVALE;  Surgeon: Rexene Alberts, MD;  Location: [place];  Service: Open Heart Surgery;  Laterality: Right;   OVARIAN CYST REMOVAL Right 1990   TEE WITHOUT CARDIOVERSION N/A 07/11/2016   Procedure: TRANSESOPHAGEAL ECHOCARDIOGRAM (TEE);  Surgeon: Rexene Alberts, MD;  Location: [place];  Service: Open Heart Surgery;  Laterality: N/A;   Patient Active Problem List   Diagnosis Date Noted   Pain in right hip 03/12/2022   Esophageal web    Female stress incontinence 09/07/2020   Heart disease 09/07/2020   Primary osteoarthritis of left knee 05/31/2020   At high risk for breast cancer 08/30/2019   Pelvic floor dysfunction 12/28/2018   Chronic RUQ pain - exacerbation 12/28/2018   Atrial flutter with rapid ventricular response ([place]) 10/27/2016   s/p minimally invasive resection of right atrial lipoma    Chronic pain 06/08/2016   Chronic fatigue 06/08/2016   GERD (gastroesophageal reflux disease) with esophagitis 06/08/2016   Palpitations 06/08/2016   Anxiety  06/08/2016   Multiple environmental allergies 03/04/2016   Multiple food allergies 03/04/2016   Irritable bowel syndrome with constipation 01/27/2013   Fibromyalgia 01/07/2007    PCP: Ma Hillock, DO  REFERRING PROVIDER: Gatha Mayer, MD   REFERRING DIAG: K59.09 (ICD-10-CM) - Chronic constipation  Rationale for Evaluation and Treatment: Rehabilitation  THERAPY DIAG:  Muscle weakness (generalized)  Abnormal posture  Unspecified lack of coordination  ONSET DATE: chronic  SUBJECTIVE:  SUBJECTIVE STATEMENT: Pt reports she had a painful bowel movement and had some bleeding a few days ago, then felt tight and unable to go at all. Pt has miralax and sena plus and was able to have x2 bowel movements and felt better without pain. Does state she has had less pain at Rt abdominal quadrants with abdominal massage and feels like that helps   PERTINENT HISTORY:  Constipation, DRA, external hemorrhoid, rectocele, OA, fibromyalgia, prior Rt atrial lipoma and flutter, reports she has had Rt ovary cyst removed as a teenager, gallbladder removed 2021, LAVH 2014   Fluid intake: Yes: water - 80-100oz water per day, one cup coffee in morning.     PAIN:  Are you having pain? Yes NPRS scale: 5/10 (globally, all joints today),  Pain location:  bil hip pain is usually the worst pain. Does have current Rt lipoma on hip.   Pain type: aching and tight Pain description: constant   Aggravating factors: lifting laundry, twisting, stress, driving Relieving factors: squatting helps with stretching  PRECAUTIONS: None  WEIGHT BEARING RESTRICTIONS: No  FALLS:  Has patient fallen in last 6 months? 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   PLOF: Independent  PATIENT GOALS: to have less pain, more regular bowels, strengthening DRA   Sexual abuse: No  BOWEL MOVEMENT: Pain with bowel movement: Yes - with hemorrhoids  Type of bowel movement:Type (Bristol Stool Scale) 2-6, Frequency daily  to every two days, and Strain Yes ( a little)  Fully empty rectum: No not all the time Leakage: No Pads: No Fiber supplement: Yes: uses miralax or softener if needed but not daily   URINATION: Pain with urination: No Fully empty bladder: No not always Stream: Strong and Weak Urgency: Yes: sometimes with increased water intake Frequency: not more than every 2 hours Leakage: Coughing and Sneezing only strongly, very small amount  Pads: No  INTERCOURSE: Pain with intercourse: During Penetration and sometimes makes right hip pain worse Ability to have vaginal penetration:  Yes:   Climax: able to climax without pain  Marinoff Scale: 2/3 Menopause has started and does have a lot dryness   PREGNANCY: Vaginal deliveries 4 Tearing No  but episiotomy with first C-section deliveries 0 Currently pregnant No  PROLAPSE: None   OBJECTIVE:   DIAGNOSTIC FINDINGS:    COGNITION: Overall cognitive status: Within functional limits for tasks assessed     SENSATION: Light touch: Deficits fingers and feet tingling Proprioception: Appears intact  MUSCLE LENGTH: Bil hamstrings and adductors limited blue bandy 25%     POSTURE: rounded shoulders, forward head, and posterior pelvic tilt   LUMBARAROM/PROM:  A/PROM A/PROM  eval  Flexion WFL  Extension WFL  Right lateral flexion Limited by 25%  Left lateral flexion Limited by 25%  Right rotation WFL  Left rotation WFL   (Blank rows = not tested)  LOWER EXTREMITY ROM:  WFL  LOWER EXTREMITY MMT:  RT hips and adductors limited by 50% Lt hips and adductors limited by 25%  PALPATION:   General  TTP throughout Rt abdominal quadrants with  noted fascial restrictions, Rt external obliques, Rt internal obliques, Rt psoas and with palpation in Rt abdomen pt reports she feels her Rt hip pain replicated                External Perineal Exam deferred until next visit  Internal Pelvic Floor deferred until next visit  Patient confirms identification and approves PT to assess internal pelvic floor and treatment No  PELVIC MMT:   MMT eval  Vaginal   Internal Anal Sphincter   External Anal Sphincter   Puborectalis   Diastasis Recti Coning noted above umbilicus with active abdominal shoulder lift.    (Blank rows = not tested)        TONE: deferred until next visit  PROLAPSE: deferred until next visit  TODAY'S TREATMENT:                                                                                                                               10/23/22: 2x10 hooklying ball press for TA activation 2x10 dead bug each side with cues for core activation and PPT Opp hand/knee ball press 2x10 2x10 bil elbow ext with green band in hooklying with TA activation 2x10 rotational palloff green band  2x10 reverse rotational palloff green band     PATIENT EDUCATION:  Education details: [id number], abdominal massage, moisturizers Person educated: Patient Education method: Explanation, Demonstration, Tactile cues, Verbal cues, and Handouts Education comprehension: verbalized understanding and returned demonstration  HOME EXERCISE PROGRAM: [id number]  ASSESSMENT:  CLINICAL IMPRESSION: Patient session focused core strengthening for DRA separation and improved TA activation with breathing mechanics. Pt tolerated well with cues for technique and tactile cues for core activation and pelvic tilts as needed. HEP updated and pt denied additional questions. All exercises added to HEP completed in session. Pt would benefit from additional PT to further address deficits.    OBJECTIVE IMPAIRMENTS: decreased  coordination, decreased endurance, decreased mobility, decreased strength, increased fascial restrictions, increased muscle spasms, impaired flexibility, improper body mechanics, postural dysfunction, and pain.   ACTIVITY LIMITATIONS: carrying, lifting, sitting, squatting, continence, and locomotion level  PARTICIPATION LIMITATIONS: interpersonal relationship and community activity  PERSONAL FACTORS: Fitness, Time since onset of injury/illness/exacerbation, and 1 comorbidity: medical history  are also affecting patient's functional outcome.   REHAB POTENTIAL: Good  CLINICAL DECISION MAKING: Stable/uncomplicated  EVALUATION COMPLEXITY: Low   GOALS: Goals reviewed with patient? Yes  SHORT TERM GOALS: Target date: 10/27/22  Pt to be I with HEP. Baseline: Goal status: INITIAL  2.  Pt will report no  more than 4/10 pain due to improvements in posture, strength, and muscle length  Baseline:  Goal status: INITIAL  3.  Pt to be I with voiding mechanics and breathing mechanics to decrease strain at pelvic floor and improve bowel and bladder habits.  Baseline:  Goal status: INITIAL  4.  Pt to be I with abdominal massage for improved bowel habits.  Baseline:  Goal status: INITIAL  LONG TERM GOALS: Target date: 12/30/21  Pt to be I with advanced HEP.  Baseline:  Goal status: INITIAL  2.  Pt to demonstrate at least 5/5 bil hip strength for improved pelvic stability and functional squats without leakage.  Baseline:  Goal status: INITIAL  3.  Pt will report 2/10pain due to improvements in posture, strength, and muscle length  Baseline:  Goal status: INITIAL  4.  Pt will report type 3-4 on average for bowel movements based on stool chart for improved consistency and regularity.  Baseline:  Goal status: INITIAL  5.  Pt to demonstrate minimal to no abdominal coning with core strengthening exercises for squatting for improved load transfer and decreased compensatory strategies.   Baseline:  Goal status: INITIAL   PLAN:  PT FREQUENCY: 1x/week  PT DURATION:  8 sessions  PLANNED INTERVENTIONS: Therapeutic exercises, Therapeutic activity, Neuromuscular re-education, Patient/Family education, Self Care, Joint mobilization, Aquatic Therapy, Dry Needling, Spinal mobilization, Cryotherapy, Moist heat, scar mobilization, Taping, Biofeedback, and Manual therapy  PLAN FOR NEXT SESSION: manual externally, internal assessment if pt consents, hip and core strengthening, hip and spine stretching, breathing and lifting mechanics    Stacy Gardner, PT, DPT 10/24/2311:28 PM

## 2022-10-27 ENCOUNTER — Ambulatory Visit: Payer: BC Managed Care – PPO | Admitting: Physical Therapy

## 2022-10-27 DIAGNOSIS — M6281 Muscle weakness (generalized): Secondary | ICD-10-CM | POA: Diagnosis not present

## 2022-10-27 DIAGNOSIS — R279 Unspecified lack of coordination: Secondary | ICD-10-CM

## 2022-10-27 DIAGNOSIS — R293 Abnormal posture: Secondary | ICD-10-CM

## 2022-10-27 NOTE — Therapy (Signed)
OUTPATIENT PHYSICAL THERAPY FEMALE PELVIC TREATMENT   Patient Name: Emily Joseph MRN: [id number] DOB:[birth year], 51 y.o., female Today's Date: 10/27/2022  END OF SESSION:  PT End of Session - 10/27/22 1144     Visit Number 3    Date for PT Re-Evaluation 12/30/22    Authorization Type [employer]    PT Start Time 1145    PT Stop Time 1228    PT Time Calculation (min) 43 min    Activity Tolerance Patient tolerated treatment well    Behavior During Therapy [facility] for tasks assessed/performed             Past Medical History:  Diagnosis Date   Adenomyosis    not papanicolaou smear of cervix and cervical HPV   Allergy    Anxiety 06/08/2016   -about her health and about taking medications   Arthritis    ?   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 Trudie Reed in 2017, [place] [facility] -seeing Dr. Jaynee Eagles, Neurologist for back pain with radicular symptoms   Complication of anesthesia    "with epidural bp bottoms out"   Dysrhythmia    fast hr   Eosinophilic esophagitis    Sees Dr. Carlean Purl   Fibromyalgia    GERD (gastroesophageal reflux disease)    H/O laminectomy 09/07/2020   Heart murmur    History of atrial flutter 10/2016   spontaneous conversion to sinus   History of gallstones    History of laparoscopic-assisted vaginal hysterectomy 09/07/2020   History of pneumonia    when pt was pregnant   History of pneumothorax 07/2016   Right   History of sinus tachycardia    Iron deficiency anemia due to chronic blood loss    Menorrhagia, s/p complete hysterectomy, ovaries remain hx   Irritable bowel syndrome 01/27/2013   Dr Carlean Purl 02/2016    Microhematuria    Mouth problem    failed gum graft 1/16   Nasal airway abnormality    Nasal obstruction 06/26/2015   Seen by ENT  06-26-15.  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   Shortness of breath dyspnea    SUI (stress urinary incontinence, female)    Syncope    with last child with epideral   UTI (urinary tract infection) 07/14/2016   >=100,000 COLONIES/mL KLEBSIELLA PNEUMONIA   Vitamin D deficiency    Past Surgical History:  Procedure Laterality Date   APPENDECTOMY  01/1989   BALLOON DILATION N/A 02/09/2013   Procedure: BALLOON DILATION;  Surgeon: Arta Silence, MD;  Location: [facility];  Service: Endoscopy;  Laterality: N/A;   BILATERAL SALPINGECTOMY N/A 06/14/2014   Procedure: BILATERAL SALPINGECTOMY;  Surgeon: Allena Katz, MD;  Location: [facility];  Service: Gynecology;  Laterality: N/A;   BIOPSY  09/04/2021   Procedure: BIOPSY;  Surgeon: Gatha Mayer, MD;  Location: [facility];  Service: Endoscopy;;   BREAST BIOPSY Right    biopsy    CHOLECYSTECTOMY N/A 11/21/2019   Procedure: LAPAROSCOPIC CHOLECYSTECTOMY WITH INTRAOPERATIVE CHOLANGIOGRAM;  Surgeon: Lucia Gaskins,  Shanon Brow, MD;  Location: [facility];  Service: General;  Laterality: N/A;   COLONOSCOPY WITH PROPOFOL N/A 02/09/2013   Procedure: COLONOSCOPY WITH PROPOFOL;  Surgeon: Arta Silence, MD;  Location: [facility];  Service: Endoscopy;  Laterality: N/A;  need ultra thin colon scope   ESOPHAGEAL DILATION  09/04/2021   Procedure: ESOPHAGEAL DILATION;  Surgeon: Gatha Mayer, MD;  Location: [facility];  Service: Endoscopy;;   ESOPHAGOGASTRODUODENOSCOPY Left 06/30/2021   Procedure: ESOPHAGOGASTRODUODENOSCOPY (EGD);  Surgeon: Lavena Bullion, DO;  Location[facility];  Service: Gastroenterology;  Laterality: Left;   ESOPHAGOGASTRODUODENOSCOPY (EGD) WITH PROPOFOL N/A 02/09/2013   Procedure: ESOPHAGOGASTRODUODENOSCOPY (EGD) WITH PROPOFOL;  Surgeon: Arta Silence, MD;  Location: [facility];  Service: Endoscopy;  Laterality: N/A;   ESOPHAGOGASTRODUODENOSCOPY (EGD) WITH PROPOFOL N/A 09/04/2021   Procedure: ESOPHAGOGASTRODUODENOSCOPY (EGD) WITH PROPOFOL;  Surgeon: Gatha Mayer, MD;  Location: [facility];  Service: Endoscopy;  Laterality: N/A;   FOREIGN BODY REMOVAL  06/30/2021   Procedure: FOREIGN BODY REMOVAL;  Surgeon: Lavena Bullion, DO;  Location: [place] [facility];  Service: Gastroenterology;;   LAPAROSCOPIC ASSISTED VAGINAL HYSTERECTOMY Right 06/14/2014   Procedure: OPEN LAPAROSCOPIC ASSISTED VAGINAL HYSTERECTOMY;  Surgeon: Allena Katz, MD;  Location: [facility] [facility];  Service: Gynecology;  Laterality: Right;   LYSIS OF ADHESION N/A 06/14/2014   Procedure: LYSIS OF ADHESION;  Surgeon: Allena Katz, MD;  Location: [facility];  Service: Gynecology;  Laterality: N/A;   MINIMALLY INVASIVE EXCISION OF ATRIAL MYXOMA Right 07/11/2016   Procedure: MINIMALLY INVASIVE RESECTION OF RIGHT ATRIAL LIPOMA WITH CLOSURE OF PATENT FORAMEN OVALE;  Surgeon: Rexene Alberts, MD;  Location: [place];  Service: Open Heart Surgery;  Laterality: Right;   OVARIAN CYST REMOVAL Right 1990   TEE WITHOUT CARDIOVERSION N/A 07/11/2016   Procedure: TRANSESOPHAGEAL ECHOCARDIOGRAM (TEE);  Surgeon: Rexene Alberts, MD;  Location: [place];  Service: Open Heart Surgery;  Laterality: N/A;   Patient Active Problem List   Diagnosis Date Noted   Pain in right hip 03/12/2022   Esophageal web    Female stress incontinence 09/07/2020   Heart disease 09/07/2020   Primary osteoarthritis of left knee 05/31/2020   At high risk for breast cancer 08/30/2019   Pelvic floor dysfunction 12/28/2018   Chronic RUQ pain - exacerbation 12/28/2018   Atrial flutter with rapid ventricular response ([place]) 10/27/2016   s/p minimally invasive resection of right atrial lipoma    Chronic pain 06/08/2016   Chronic fatigue 06/08/2016   GERD (gastroesophageal reflux disease) with esophagitis 06/08/2016   Palpitations 06/08/2016   Anxiety  06/08/2016   Multiple environmental allergies 03/04/2016   Multiple food allergies 03/04/2016   Irritable bowel syndrome with constipation 01/27/2013   Fibromyalgia 01/07/2007    PCP: Ma Hillock, DO  REFERRING PROVIDER: Gatha Mayer, MD   REFERRING DIAG: K59.09 (ICD-10-CM) - Chronic constipation  Rationale for Evaluation and Treatment: Rehabilitation  THERAPY DIAG:  Muscle weakness (generalized)  Abnormal posture  Unspecified lack of coordination  ONSET DATE: chronic  SUBJECTIVE:  SUBJECTIVE STATEMENT: Pt reports HEP has been going well and reports she can tell a difference from Rt and Lt lower abdominals reporting she feels weaker on Rt compared to Lt and this is also her hip that bothers her. Pt reports she has been a little constipated and would like to reattempts abdominal massage today.   PERTINENT HISTORY:  Constipation, DRA, external hemorrhoid, rectocele, OA, fibromyalgia, prior Rt atrial lipoma and flutter, reports she has had Rt ovary cyst removed as a teenager, gallbladder removed 2021, LAVH 2014   Fluid intake: Yes: water - 80-100oz water per day, one cup coffee in morning.     PAIN:  Are you having pain? Yes NPRS scale: 5/10 (globally, all joints today),  Pain location:  bil hip pain is usually the worst pain. Does have current Rt lipoma on hip.   Pain type: aching and tight Pain description: constant   Aggravating factors: lifting laundry, twisting, stress, driving Relieving factors: squatting helps with stretching  PRECAUTIONS: None  WEIGHT BEARING RESTRICTIONS: No  FALLS:  Has patient fallen in last 6 months? 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    PLOF: Independent  PATIENT GOALS: to have less pain, more regular bowels, strengthening DRA   Sexual abuse: No  BOWEL MOVEMENT: Pain with bowel movement: Yes - with hemorrhoids  Type of bowel movement:Type (Bristol Stool Scale) 2-6, Frequency daily  to every two days, and Strain Yes ( a little)  Fully empty rectum: No not all the time Leakage: No Pads: No Fiber supplement: Yes: uses miralax or softener if needed but not daily   URINATION: Pain with urination: No Fully empty bladder: No not always Stream: Strong and Weak Urgency: Yes: sometimes with increased water intake Frequency: not more than every 2 hours Leakage: Coughing and Sneezing only strongly, very small amount  Pads: No  INTERCOURSE: Pain with intercourse: During Penetration and sometimes makes right hip pain worse Ability to have vaginal penetration:  Yes:   Climax: able to climax without pain  Marinoff Scale: 2/3 Menopause has started and does have a lot dryness   PREGNANCY: Vaginal deliveries 4 Tearing No  but episiotomy with first C-section deliveries 0 Currently pregnant No  PROLAPSE: None   OBJECTIVE:   DIAGNOSTIC FINDINGS:    COGNITION: Overall cognitive status: Within functional limits for tasks assessed     SENSATION: Light touch: Deficits fingers and feet tingling Proprioception: Appears intact  MUSCLE LENGTH: Bil hamstrings and adductors limited blue bandy 25%     POSTURE: rounded shoulders, forward head, and posterior pelvic tilt   LUMBARAROM/PROM:  A/PROM A/PROM  eval  Flexion WFL  Extension WFL  Right lateral flexion Limited by 25%  Left lateral flexion Limited by 25%  Right rotation WFL  Left rotation WFL   (Blank rows = not tested)  LOWER EXTREMITY ROM:  WFL  LOWER EXTREMITY MMT:  RT hips and adductors limited by 50% Lt hips and adductors limited by 25%  PALPATION:   General  TTP throughout Rt abdominal quadrants with noted fascial restrictions, Rt  external obliques, Rt internal obliques, Rt psoas and with palpation in Rt abdomen pt reports she feels her Rt hip pain replicated                External Perineal Exam deferred until next visit  Internal Pelvic Floor deferred until next visit  Patient confirms identification and approves PT to assess internal pelvic floor and treatment No  PELVIC MMT:   MMT eval  Vaginal   Internal Anal Sphincter   External Anal Sphincter   Puborectalis   Diastasis Recti Coning noted above umbilicus with active abdominal shoulder lift.    (Blank rows = not tested)        TONE: deferred until next visit  PROLAPSE: deferred until next visit  TODAY'S TREATMENT:                                                                                                                               10/27/22: Abdominal massage to improve carry over for home for improved peristalsis and bowel habits x10 and x10 ILU method   Manual work at Rt side of abdomen with noted fascial tightness throughout all right quadrants. Pt tolerated well with indirect progressing to direct fascial release techniques. Pt denied pain but reported feeling tightness. Pt demonstrated fair release after manual work but tightness remains slightly.  X10 diaphragmatic breathing post manual and pt reports she feels much looser and can tell difference with mobility.    PATIENT EDUCATION:  Education details: [id number], abdominal massage, moisturizers Person educated: Patient Education method: Explanation, Demonstration, Tactile cues, Verbal cues, and Handouts Education comprehension: verbalized understanding and returned demonstration  HOME EXERCISE PROGRAM: [id number]  ASSESSMENT:  CLINICAL IMPRESSION: Patient session focused on manual work at abdomen today for improved bowel habits and fascial release throughout right side of abdomen. Pt educated on abdominal massage throughout as well and denied questions on  how to complete at home, agreed to complete at home for improved bowel habits. Pt would benefit from additional PT to further address deficits.    OBJECTIVE IMPAIRMENTS: decreased coordination, decreased endurance, decreased mobility, decreased strength, increased fascial restrictions, increased muscle spasms, impaired flexibility, improper body mechanics, postural dysfunction, and pain.   ACTIVITY LIMITATIONS: carrying, lifting, sitting, squatting, continence, and locomotion level  PARTICIPATION LIMITATIONS: interpersonal relationship and community activity  PERSONAL FACTORS: Fitness, Time since onset of injury/illness/exacerbation, and 1 comorbidity: medical history  are also affecting patient's functional outcome.   REHAB POTENTIAL: Good  CLINICAL DECISION MAKING: Stable/uncomplicated  EVALUATION COMPLEXITY: Low   GOALS: Goals reviewed with patient? Yes  SHORT TERM GOALS: Target date: 10/27/22  Pt to be I with HEP. Baseline: Goal status: INITIAL  2.  Pt will report no  more than 4/10 pain due to improvements in posture, strength, and muscle length  Baseline:  Goal status: INITIAL  3.  Pt to be I with voiding mechanics and breathing mechanics to decrease strain at pelvic floor and improve bowel and bladder habits.  Baseline:  Goal status: INITIAL  4.  Pt to be I with abdominal massage for improved bowel habits.  Baseline:  Goal status: INITIAL  LONG TERM GOALS: Target date: 12/30/21  Pt to be I with advanced HEP.  Baseline:  Goal status: INITIAL  2.  Pt to demonstrate at least 5/5 bil hip strength for improved pelvic stability and functional squats without leakage.  Baseline:  Goal status: INITIAL  3. Pt will report 2/10pain due to improvements in posture, strength, and muscle length  Baseline:  Goal status: INITIAL  4.  Pt will report type 3-4 on average for bowel movements based on stool chart for improved consistency and regularity.  Baseline:  Goal status:  INITIAL  5.  Pt to demonstrate minimal to no abdominal coning with core strengthening exercises for squatting for improved load transfer and decreased compensatory strategies.  Baseline:  Goal status: INITIAL   PLAN:  PT FREQUENCY: 1x/week  PT DURATION:  8 sessions  PLANNED INTERVENTIONS: Therapeutic exercises, Therapeutic activity, Neuromuscular re-education, Patient/Family education, Self Care, Joint mobilization, Aquatic Therapy, Dry Needling, Spinal mobilization, Cryotherapy, Moist heat, scar mobilization, Taping, Biofeedback, and Manual therapy  PLAN FOR NEXT SESSION: manual externally, internal assessment if pt consents, hip and core strengthening, hip and spine stretching, breathing and lifting mechanics    Stacy Gardner, PT, DPT 10/28/2311:29 PM

## 2022-11-11 LAB — HM MAMMOGRAPHY

## 2022-11-13 ENCOUNTER — Ambulatory Visit: Payer: BC Managed Care – PPO | Admitting: Physical Therapy

## 2022-11-25 ENCOUNTER — Ambulatory Visit: Payer: BC Managed Care – PPO | Attending: Internal Medicine

## 2022-11-25 DIAGNOSIS — R293 Abnormal posture: Secondary | ICD-10-CM

## 2022-11-25 DIAGNOSIS — R279 Unspecified lack of coordination: Secondary | ICD-10-CM

## 2022-11-25 DIAGNOSIS — M6281 Muscle weakness (generalized): Secondary | ICD-10-CM

## 2022-11-25 NOTE — Therapy (Signed)
OUTPATIENT PHYSICAL THERAPY FEMALE PELVIC TREATMENT   Patient Name: Emily Joseph MRN: [id number] DOB:1971-04-23, 52 y.o., female Today's Date: 11/25/2022  END OF SESSION:  PT End of Session - 11/25/22 1146     Visit Number 4    Date for PT Re-Evaluation 12/30/22    Authorization Type [employer]    PT Start Time 1145    PT Stop Time 1225    PT Time Calculation (min) 40 min    Activity Tolerance Patient tolerated treatment well    Behavior During Therapy WFL for tasks assessed/performed              Past Medical History:  Diagnosis Date   Adenomyosis    not papanicolaou smear of cervix and cervical HPV   Allergy    Anxiety 06/08/2016   -about her health and about taking medications   Arthritis    ?   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 Trudie Reed in 2017, [place] [facility] -seeing Dr. Jaynee Eagles, Neurologist for back pain with radicular symptoms   Complication of anesthesia    "with epidural bp bottoms out"   Dysrhythmia    fast hr   Eosinophilic esophagitis    Sees Dr. Carlean Purl   Fibromyalgia    GERD (gastroesophageal reflux disease)    H/O laminectomy 09/07/2020   Heart murmur    History of atrial flutter 10/2016   spontaneous conversion to sinus   History of gallstones    History of laparoscopic-assisted vaginal hysterectomy 09/07/2020   History of pneumonia    when pt was pregnant   History of pneumothorax 07/2016   Right   History of sinus tachycardia    Iron deficiency anemia due to chronic blood loss    Menorrhagia, s/p complete hysterectomy, ovaries remain hx   Irritable bowel syndrome 01/27/2013   Dr Carlean Purl 02/2016    Microhematuria    Mouth problem    failed gum graft 1/16   Nasal airway abnormality    Nasal obstruction 06/26/2015   Seen by ENT  06-26-15.  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   Shortness of breath dyspnea    SUI (stress urinary incontinence, female)    Syncope    with last child with epideral   UTI (urinary tract infection) 07/14/2016   >=100,000 COLONIES/mL KLEBSIELLA PNEUMONIA   Vitamin D deficiency    Past Surgical History:  Procedure Laterality Date   APPENDECTOMY  01/1989   BALLOON DILATION N/A 02/09/2013   Procedure: BALLOON DILATION;  Surgeon: Arta Silence, MD;  Location: [facility];  Service: Endoscopy;  Laterality: N/A;   BILATERAL SALPINGECTOMY N/A 06/14/2014   Procedure: BILATERAL SALPINGECTOMY;  Surgeon: Allena Katz, MD;  Location: [facility];  Service: Gynecology;  Laterality: N/A;   BIOPSY  09/04/2021   Procedure: BIOPSY;  Surgeon: Gatha Mayer, MD;  Location: [facility];  Service: Endoscopy;;   BREAST BIOPSY Right    biopsy    CHOLECYSTECTOMY N/A 11/21/2019   Procedure: LAPAROSCOPIC CHOLECYSTECTOMY WITH INTRAOPERATIVE CHOLANGIOGRAM;  Surgeon:  Alphonsa Overall, MD;  Location: [facility];  Service: General;  Laterality: N/A;   COLONOSCOPY WITH PROPOFOL N/A 02/09/2013   Procedure: COLONOSCOPY WITH PROPOFOL;  Surgeon: Arta Silence, MD;  Location: [facility];  Service: Endoscopy;  Laterality: N/A;  need ultra thin colon scope   ESOPHAGEAL DILATION  09/04/2021   Procedure: ESOPHAGEAL DILATION;  Surgeon: Gatha Mayer, MD;  Location: [facility];  Service: Endoscopy;;   ESOPHAGOGASTRODUODENOSCOPY Left 06/30/2021   Procedure: ESOPHAGOGASTRODUODENOSCOPY (EGD);  Surgeon: Lavena Bullion, DO;  Location: [facility];  Service: Gastroenterology;  Laterality: Left;   ESOPHAGOGASTRODUODENOSCOPY (EGD) WITH PROPOFOL N/A 02/09/2013   Procedure: ESOPHAGOGASTRODUODENOSCOPY (EGD) WITH PROPOFOL;  Surgeon: Arta Silence, MD;  Location: [facility];  Service: Endoscopy;  Laterality: N/A;   ESOPHAGOGASTRODUODENOSCOPY (EGD) WITH PROPOFOL N/A 09/04/2021   Procedure: ESOPHAGOGASTRODUODENOSCOPY (EGD) WITH PROPOFOL;  Surgeon: Gatha Mayer, MD;  Location: [facility];  Service: Endoscopy;  Laterality: N/A;   FOREIGN BODY REMOVAL  06/30/2021   Procedure: FOREIGN BODY REMOVAL;  Surgeon: Lavena Bullion, DO;  Location: [facility];  Service: Gastroenterology;;   LAPAROSCOPIC ASSISTED VAGINAL HYSTERECTOMY Right 06/14/2014   Procedure: OPEN LAPAROSCOPIC ASSISTED VAGINAL HYSTERECTOMY;  Surgeon: Allena Katz, MD;  Location: [facility];  Service: Gynecology;  Laterality: Right;   LYSIS OF ADHESION N/A 06/14/2014   Procedure: LYSIS OF ADHESION;  Surgeon: Allena Katz, MD;  Location: [facility];  Service: Gynecology;  Laterality: N/A;   MINIMALLY INVASIVE EXCISION OF ATRIAL MYXOMA Right 07/11/2016   Procedure: MINIMALLY INVASIVE RESECTION OF RIGHT ATRIAL LIPOMA WITH CLOSURE OF PATENT FORAMEN OVALE;  Surgeon: Rexene Alberts, MD;  Location: [facility];  Service: Open Heart Surgery;  Laterality: Right;   OVARIAN CYST REMOVAL Right 1990   TEE WITHOUT CARDIOVERSION N/A 07/11/2016   Procedure: TRANSESOPHAGEAL ECHOCARDIOGRAM (TEE);  Surgeon: Rexene Alberts, MD;  Location: [facility];  Service: Open Heart Surgery;  Laterality: N/A;   Patient Active Problem List   Diagnosis Date Noted   Pain in right hip 03/12/2022   Esophageal web    Female stress incontinence 09/07/2020   Heart disease 09/07/2020   Primary osteoarthritis of left knee 05/31/2020   At high risk for breast cancer 08/30/2019   Pelvic floor dysfunction 12/28/2018   Chronic RUQ pain - exacerbation 12/28/2018   Atrial flutter with rapid ventricular response ([place]) 10/27/2016   s/p minimally invasive resection of right atrial lipoma    Chronic pain 06/08/2016   Chronic fatigue 06/08/2016   GERD (gastroesophageal reflux disease) with esophagitis 06/08/2016   Palpitations 06/08/2016   Anxiety  06/08/2016   Multiple environmental allergies 03/04/2016   Multiple food allergies 03/04/2016   Irritable bowel syndrome with constipation 01/27/2013   Fibromyalgia 01/07/2007    PCP: Ma Hillock, DO  REFERRING PROVIDER: Gatha Mayer, MD   REFERRING DIAG: K59.09 (ICD-10-CM) - Chronic constipation  Rationale for Evaluation and Treatment: Rehabilitation  THERAPY DIAG:  Muscle weakness (generalized)  Abnormal posture  Unspecified lack of coordination  ONSET DATE: chronic  SUBJECTIVE:  SUBJECTIVE STATEMENT: Pt states that she has used heel lift for 20+ years, but this last year she had a hip shift that started causing her a lot of pain. She now only needs a 72m lift. This has caused her to now have Lt foot pain. She can now breathe much better after having heart surgery. Her leaking is mostly with stress/pressure, and only after multiple sneezes/coughs. She has had constipation her entire life; she does have a sPhysiological scientist She feels like her hip is doing a lot better. She currently has 2/10 hip pain, but feels like she overstretched this morning. She feels like she is very weak in her core and knows this will help everything.   PERTINENT HISTORY:  Constipation, DRA, external hemorrhoid, rectocele, OA, fibromyalgia, prior Rt atrial lipoma and flutter, reports she has had Rt ovary cyst removed as a teenager, gallbladder removed 2021, LAVH 2014   Fluid intake: Yes: water - 80-100oz water per day, one cup coffee in morning.     PAIN:  Are you having pain? Yes NPRS scale: 2/10 (globally, all joints today),  Pain location:  bil hip pain is usually the worst pain. Does have current Rt lipoma on hip.   Pain type: aching and tight Pain description: constant   Aggravating factors: lifting laundry,  twisting, stress, driving Relieving factors: squatting helps with stretching  PRECAUTIONS: None  WEIGHT BEARING RESTRICTIONS: No  FALLS:  Has patient fallen in last 6 months? 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   PLOF: Independent  PATIENT GOALS: to have less pain, more regular bowels, strengthening DRA   Sexual abuse: No  BOWEL MOVEMENT: Pain with bowel movement: Yes - with hemorrhoids  Type of bowel movement:Type (Bristol Stool Scale) 2-6, Frequency daily  to every two days, and Strain Yes ( a little)  Fully empty rectum: No not all the time Leakage: No Pads: No Fiber supplement: Yes: uses miralax or softener if needed but not daily   URINATION: Pain with urination: No Fully empty bladder: No not always Stream: Strong and Weak Urgency: Yes: sometimes with increased water intake Frequency: not more than every 2 hours Leakage: Coughing and Sneezing only strongly, very small amount  Pads: No  INTERCOURSE: Pain with intercourse: During Penetration and sometimes makes right hip pain worse Ability to have vaginal penetration:  Yes:   Climax: able to climax without pain  Marinoff Scale: 2/3 Menopause has started and does have a lot dryness   PREGNANCY: Vaginal deliveries 4 Tearing No  but episiotomy with first C-section deliveries 0 Currently pregnant No  PROLAPSE: None   OBJECTIVE:   DIAGNOSTIC FINDINGS:    COGNITION: Overall cognitive status: Within functional limits for tasks assessed     SENSATION: Light touch: Deficits fingers and feet tingling Proprioception: Appears intact  MUSCLE LENGTH: Bil hamstrings and adductors limited blue bandy 25%     POSTURE: rounded shoulders, forward head, and posterior pelvic tilt   LUMBARAROM/PROM:  A/PROM A/PROM  eval  Flexion WFL  Extension WFL  Right lateral flexion Limited by 25%   Left lateral flexion Limited by 25%  Right rotation WFL  Left rotation WFL   (Blank rows = not tested)  LOWER EXTREMITY ROM:  WFL  LOWER EXTREMITY MMT:  RT hips and adductors limited by 50% Lt hips and adductors limited by 25%  PALPATION:   General  TTP throughout Rt abdominal quadrants with  noted fascial restrictions, Rt external obliques, Rt internal obliques, Rt psoas and with palpation in Rt abdomen pt reports she feels her Rt hip pain replicated                External Perineal Exam deferred until next visit                             Internal Pelvic Floor deferred until next visit  Patient confirms identification and approves PT to assess internal pelvic floor and treatment No  PELVIC MMT:   MMT eval  Vaginal   Internal Anal Sphincter   External Anal Sphincter   Puborectalis   Diastasis Recti Coning noted above umbilicus with active abdominal shoulder lift.    (Blank rows = not tested)        TONE: deferred until next visit  PROLAPSE: deferred until next visit  TODAY'S TREATMENT 11/25/22 Neuromuscular re-education: Pallof press 2 x 10 bil, green band Unilateral extension 2 x 10 bil, green band Lunge with lateral pull, 2 x 10 bil Exercises: Calf stretch seated Seated forward fold 4 minutes Seated lateral flexion 2 x  20 seconds bil Therapeutic activities: Self-plantar fascia release Self-bowel massage Splinting   TREATMENT:                                                                                                                               10/27/22: Abdominal massage to improve carry over for home for improved peristalsis and bowel habits x10 and x10 ILU method   Manual work at Rt side of abdomen with noted fascial tightness throughout all right quadrants. Pt tolerated well with indirect progressing to direct fascial release techniques. Pt denied pain but reported feeling tightness. Pt demonstrated fair release after manual work but tightness  remains slightly.  X10 diaphragmatic breathing post manual and pt reports she feels much looser and can tell difference with mobility.    PATIENT EDUCATION:  Education details: [id number], abdominal massage, moisturizers Person educated: Patient Education method: Explanation, Demonstration, Tactile cues, Verbal cues, and Handouts Education comprehension: verbalized understanding and returned demonstration  HOME EXERCISE PROGRAM: [id number]  ASSESSMENT:  CLINICAL IMPRESSION: We discussed daily bowel massage, splinting, and body mechanics during bowel movements to help decrease straining and improve ease of evacuation; she was able to perform balloon breathing and encouraged to incorporate during bowel movements when she feels like she has to push. She did well with mobility exercises and believe this will be very helpful as she progress strengthening due to fascial restriction on Rt side. She had difficulty with with lunges due to Lt foot pain, but she did seem to stretch out and able to perform more easily. She requires cues for breathing to better engage core. Pt would benefit from additional PT to further address deficits.    OBJECTIVE IMPAIRMENTS: decreased coordination, decreased endurance, decreased mobility, decreased strength, increased fascial restrictions, increased muscle  spasms, impaired flexibility, improper body mechanics, postural dysfunction, and pain.   ACTIVITY LIMITATIONS: carrying, lifting, sitting, squatting, continence, and locomotion level  PARTICIPATION LIMITATIONS: interpersonal relationship and community activity  PERSONAL FACTORS: Fitness, Time since onset of injury/illness/exacerbation, and 1 comorbidity: medical history  are also affecting patient's functional outcome.   REHAB POTENTIAL: Good  CLINICAL DECISION MAKING: Stable/uncomplicated  EVALUATION COMPLEXITY: Low   GOALS: Goals reviewed with patient? Yes  SHORT TERM GOALS: Target date: 10/27/22 -  updated 11/25/22  Pt to be I with HEP. Baseline: Goal status:MET 11/25/22  2.  Pt will report no  more than 4/10 pain due to improvements in posture, strength, and muscle length  Baseline:  Goal status: IN PROGRESS  3.  Pt to be I with voiding mechanics and breathing mechanics to decrease strain at pelvic floor and improve bowel and bladder habits.  Baseline:  Goal status:MET 11/25/22  4.  Pt to be I with abdominal massage for improved bowel habits.  Baseline:  Goal status: MET 11/25/22  LONG TERM GOALS: Target date: 12/30/21  Pt to be I with advanced HEP.  Baseline:  Goal status: IN PROGRESS  2.  Pt to demonstrate at least 5/5 bil hip strength for improved pelvic stability and functional squats without leakage.  Baseline:  Goal status:IN PROGRESS  3. Pt will report 2/10pain due to improvements in posture, strength, and muscle length  Baseline:  Goal status:IN PROGRESS  4.  Pt will report type 3-4 on average for bowel movements based on stool chart for improved consistency and regularity.  Baseline:  Goal status: IN PROGRESS  5.  Pt to demonstrate minimal to no abdominal coning with core strengthening exercises for squatting for improved load transfer and decreased compensatory strategies.  Baseline:  Goal status: IN PROGRESS   PLAN:  PT FREQUENCY: 1x/week  PT DURATION:  8 sessions  PLANNED INTERVENTIONS: Therapeutic exercises, Therapeutic activity, Neuromuscular re-education, Patient/Family education, Self Care, Joint mobilization, Aquatic Therapy, Dry Needling, Spinal mobilization, Cryotherapy, Moist heat, scar mobilization, Taping, Biofeedback, and Manual therapy  PLAN FOR NEXT SESSION: manual externally, internal assessment if pt consents, hip and core strengthening, hip and spine stretching, breathing and lifting mechanics  (single leg RDL, sidestepping with unilateral weight hold, unilateral overhead press, D2 in reverse table top, squatting with anterior weight  hold, reverse sit ups, bridge with single arm press, leg lifts)  Heather Roberts, PT, DPT01/16/2411:47 AM

## 2022-12-04 ENCOUNTER — Ambulatory Visit: Payer: BC Managed Care – PPO

## 2022-12-04 DIAGNOSIS — R279 Unspecified lack of coordination: Secondary | ICD-10-CM

## 2022-12-04 DIAGNOSIS — M6281 Muscle weakness (generalized): Secondary | ICD-10-CM

## 2022-12-04 DIAGNOSIS — R293 Abnormal posture: Secondary | ICD-10-CM

## 2022-12-04 NOTE — Therapy (Signed)
OUTPATIENT PHYSICAL THERAPY FEMALE PELVIC TREATMENT   Patient Name: DELAYNI STREED MRN: [id number] DOB:01/18/1971, 52 y.o., female Today's Date: 12/04/2022  END OF SESSION:  PT End of Session - 12/04/22 1448     Visit Number 5    Date for PT Re-Evaluation 12/30/22    Authorization Type [employer]    PT Start Time 1445    PT Stop Time 1525    PT Time Calculation (min) 40 min    Activity Tolerance Patient tolerated treatment well    Behavior During Therapy [facility] for tasks assessed/performed               Past Medical History:  Diagnosis Date   Adenomyosis    not papanicolaou smear of cervix and cervical HPV   Allergy    Anxiety 06/08/2016   -about her health and about taking medications   Arthritis    ?   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 Trudie Reed in 2017, [place] [facility] -seeing Dr. Jaynee Eagles, Neurologist for back pain with radicular symptoms   Complication of anesthesia    "with epidural bp bottoms out"   Dysrhythmia    fast hr   Eosinophilic esophagitis    Sees Dr. Carlean Purl   Fibromyalgia    GERD (gastroesophageal reflux disease)    H/O laminectomy 09/07/2020   Heart murmur    History of atrial flutter 10/2016   spontaneous conversion to sinus   History of gallstones    History of laparoscopic-assisted vaginal hysterectomy 09/07/2020   History of pneumonia    when pt was pregnant   History of pneumothorax 07/2016   Right   History of sinus tachycardia    Iron deficiency anemia due to chronic blood loss    Menorrhagia, s/p complete hysterectomy, ovaries remain hx   Irritable bowel syndrome 01/27/2013   Dr Carlean Purl 02/2016    Microhematuria    Mouth problem    failed gum graft 1/16   Nasal airway abnormality    Nasal obstruction 06/26/2015   Seen by ENT  06-26-15.  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   Shortness of breath dyspnea    SUI (stress urinary incontinence, female)    Syncope    with last child with epideral   UTI (urinary tract infection) 07/14/2016   >=100,000 COLONIES/mL KLEBSIELLA PNEUMONIA   Vitamin D deficiency    Past Surgical History:  Procedure Laterality Date   APPENDECTOMY  01/1989   BALLOON DILATION N/A 02/09/2013   Procedure: BALLOON DILATION;  Surgeon: Arta Silence, MD;  Location: [facility];  Service: Endoscopy;  Laterality: N/A;   BILATERAL SALPINGECTOMY N/A 06/14/2014   Procedure: BILATERAL SALPINGECTOMY;  Surgeon: Allena Katz, MD;  Location: [facility];  Service: Gynecology;  Laterality: N/A;   BIOPSY  09/04/2021   Procedure: BIOPSY;  Surgeon: Gatha Mayer, MD;  Location: [facility];  Service: Endoscopy;;   BREAST BIOPSY Right    biopsy    CHOLECYSTECTOMY N/A 11/21/2019   Procedure: LAPAROSCOPIC CHOLECYSTECTOMY WITH INTRAOPERATIVE CHOLANGIOGRAM;  Surgeon: Alphonsa Overall, MD;  Location: [facility];  Service: General;  Laterality: N/A;   COLONOSCOPY WITH PROPOFOL N/A 02/09/2013   Procedure: COLONOSCOPY WITH PROPOFOL;  Surgeon: Arta Silence, MD;  Location: [facility];  Service: Endoscopy;  Laterality: N/A;  need ultra thin colon scope   ESOPHAGEAL DILATION  09/04/2021   Procedure: ESOPHAGEAL DILATION;  Surgeon: Gatha Mayer, MD;  Location: [facility];  Service: Endoscopy;;   ESOPHAGOGASTRODUODENOSCOPY Left 06/30/2021   Procedure: ESOPHAGOGASTRODUODENOSCOPY (EGD);  Surgeon: Lavena Bullion, DO;  [facility];  Service: Gastroenterology;  Laterality: Left;   ESOPHAGOGASTRODUODENOSCOPY (EGD) WITH PROPOFOL N/A 02/09/2013   Procedure: ESOPHAGOGASTRODUODENOSCOPY (EGD) WITH PROPOFOL;  Surgeon: Arta Silence, MD;  Location: [facility];  Service: Endoscopy;  Laterality: N/A;   ESOPHAGOGASTRODUODENOSCOPY (EGD) WITH PROPOFOL N/A 09/04/2021   Procedure: ESOPHAGOGASTRODUODENOSCOPY (EGD) WITH PROPOFOL;  Surgeon: Gatha Mayer, MD;  Location: [facility];  Service: Endoscopy;  Laterality: N/A;   FOREIGN BODY REMOVAL  06/30/2021   Procedure: FOREIGN BODY REMOVAL;  Surgeon: Lavena Bullion, DO;  Location: [place] [facility];  Service: Gastroenterology;;   LAPAROSCOPIC ASSISTED VAGINAL HYSTERECTOMY Right 06/14/2014   Procedure: OPEN LAPAROSCOPIC ASSISTED VAGINAL HYSTERECTOMY;  Surgeon: Allena Katz, MD;  Location: [facility];  Service: Gynecology;  Laterality: Right;   LYSIS OF ADHESION N/A 06/14/2014   Procedure: LYSIS OF ADHESION;  Surgeon: Allena Katz, MD;  Location: [facility];  Service: Gynecology;  Laterality: N/A;   MINIMALLY INVASIVE EXCISION OF ATRIAL MYXOMA Right 07/11/2016   Procedure: MINIMALLY INVASIVE RESECTION OF RIGHT ATRIAL LIPOMA WITH CLOSURE OF PATENT FORAMEN OVALE;  Surgeon: Rexene Alberts, MD;  Location: [place];  Service: Open Heart Surgery;  Laterality: Right;   OVARIAN CYST REMOVAL Right 1990   TEE WITHOUT CARDIOVERSION N/A 07/11/2016   Procedure: TRANSESOPHAGEAL ECHOCARDIOGRAM (TEE);  Surgeon: Rexene Alberts, MD;  Location: [place];  Service: Open Heart Surgery;  Laterality: N/A;   Patient Active Problem List   Diagnosis Date Noted   Pain in right hip 03/12/2022   Esophageal web    Female stress incontinence 09/07/2020   Heart disease 09/07/2020   Primary osteoarthritis of left knee 05/31/2020   At high risk for breast cancer 08/30/2019   Pelvic floor dysfunction 12/28/2018   Chronic RUQ pain - exacerbation 12/28/2018   Atrial flutter with rapid ventricular response ([place]) 10/27/2016   s/p minimally invasive resection of right atrial lipoma    Chronic pain 06/08/2016   Chronic fatigue 06/08/2016   GERD (gastroesophageal reflux disease) with esophagitis 06/08/2016   Palpitations 06/08/2016   Anxiety  06/08/2016   Multiple environmental allergies 03/04/2016   Multiple food allergies 03/04/2016   Irritable bowel syndrome with constipation 01/27/2013   Fibromyalgia 01/07/2007    PCP: Ma Hillock, DO  REFERRING PROVIDER: Gatha Mayer, MD   REFERRING DIAG: K59.09 (ICD-10-CM) - Chronic constipation  Rationale for Evaluation and Treatment: Rehabilitation  THERAPY DIAG:  Muscle weakness (generalized)  Abnormal posture  Unspecified lack of coordination  ONSET DATE: chronic  SUBJECTIVE:  SUBJECTIVE STATEMENT: Pt has continued doing her 57mn work out several times in the last week and she really enjoys. She is having good bowel movements now, but did have several days in which she was not having good bowel movements.   PERTINENT HISTORY:  Constipation, DRA, external hemorrhoid, rectocele, OA, fibromyalgia, prior Rt atrial lipoma and flutter, reports she has had Rt ovary cyst removed as a teenager, gallbladder removed 2021, LAVH 2014   Fluid intake: Yes: water - 80-100oz water per day, one cup coffee in morning.     PAIN:  Are you having pain? Yes NPRS scale: 2/10 (globally, all joints today),  Pain location:  bil hip pain is usually the worst pain. Does have current Rt lipoma on hip.   Pain type: aching and tight Pain description: constant   Aggravating factors: lifting laundry, twisting, stress, driving Relieving factors: squatting helps with stretching  PRECAUTIONS: None  WEIGHT BEARING RESTRICTIONS: No  FALLS:  Has patient fallen in last 6 months? 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   PLOF: Independent  PATIENT GOALS: to have less pain, more regular bowels,  strengthening DRA   Sexual abuse: No  BOWEL MOVEMENT: Pain with bowel movement: Yes - with hemorrhoids  Type of bowel movement:Type (Bristol Stool Scale) 2-6, Frequency daily  to every two days, and Strain Yes ( a little)  Fully empty rectum: No not all the time Leakage: No Pads: No Fiber supplement: Yes: uses miralax or softener if needed but not daily   URINATION: Pain with urination: No Fully empty bladder: No not always Stream: Strong and Weak Urgency: Yes: sometimes with increased water intake Frequency: not more than every 2 hours Leakage: Coughing and Sneezing only strongly, very small amount  Pads: No  INTERCOURSE: Pain with intercourse: During Penetration and sometimes makes right hip pain worse Ability to have vaginal penetration:  Yes:   Climax: able to climax without pain  Marinoff Scale: 2/3 Menopause has started and does have a lot dryness   PREGNANCY: Vaginal deliveries 4 Tearing No  but episiotomy with first C-section deliveries 0 Currently pregnant No  PROLAPSE: None   OBJECTIVE:   DIAGNOSTIC FINDINGS:    COGNITION: Overall cognitive status: Within functional limits for tasks assessed     SENSATION: Light touch: Deficits fingers and feet tingling Proprioception: Appears intact  MUSCLE LENGTH: Bil hamstrings and adductors limited blue bandy 25%     POSTURE: rounded shoulders, forward head, and posterior pelvic tilt   LUMBARAROM/PROM:  A/PROM A/PROM  eval  Flexion WFL  Extension WFL  Right lateral flexion Limited by 25%  Left lateral flexion Limited by 25%  Right rotation WFL  Left rotation WFL   (Blank rows = not tested)  LOWER EXTREMITY ROM:  WFL  LOWER EXTREMITY MMT:  RT hips and adductors limited by 50% Lt hips and adductors limited by 25%  PALPATION:   General  TTP throughout Rt abdominal quadrants with noted fascial restrictions, Rt external obliques, Rt internal obliques, Rt psoas and with palpation in Rt abdomen  pt reports she feels her Rt hip pain replicated                External Perineal Exam deferred until next visit                             Internal Pelvic Floor  deferred until next visit  Patient confirms identification and approves PT to assess internal pelvic floor and treatment No  PELVIC MMT:   MMT eval  Vaginal   Internal Anal Sphincter   External Anal Sphincter   Puborectalis   Diastasis Recti Coning noted above umbilicus with active abdominal shoulder lift.    (Blank rows = not tested)        TONE: deferred until next visit  PROLAPSE: deferred until next visit  TODAY'S TREATMENT 12/04/22 Neuromuscular re-education: Supine ball trunk rotations 2 x 10 Supine table top ball press 2 x 10 Single leg lower 2 x 6 bil Bridge march 2 x 10 Frog bridge 2 x 10 Exercises: Butterfly 2 min (pillow under Rt knee for support) Seated hip IR 2 x 10 yellow loop, one leg at a time Supine piriformis stretch   TREATMENT 11/25/22 Neuromuscular re-education: Pallof press 2 x 10 bil, green band Unilateral extension 2 x 10 bil, green band Lunge with lateral pull, 2 x 10 bil Exercises: Calf stretch seated Seated forward fold 4 minutes Seated lateral flexion 2 x  20 seconds bil Therapeutic activities: Self-plantar fascia release Self-bowel massage Splinting   TREATMENT:                                                                                                                               10/27/22: Abdominal massage to improve carry over for home for improved peristalsis and bowel habits x10 and x10 ILU method   Manual work at Rt side of abdomen with noted fascial tightness throughout all right quadrants. Pt tolerated well with indirect progressing to direct fascial release techniques. Pt denied pain but reported feeling tightness. Pt demonstrated fair release after manual work but tightness remains slightly.  X10 diaphragmatic breathing post manual and pt reports she feels  much looser and can tell difference with mobility.    PATIENT EDUCATION:  Education details: [id number], abdominal massage, moisturizers Person educated: Patient Education method: Explanation, Demonstration, Tactile cues, Verbal cues, and Handouts Education comprehension: verbalized understanding and returned demonstration  HOME EXERCISE PROGRAM: [id number]  ASSESSMENT:  CLINICAL IMPRESSION:  Pt overall doing very well and is seeing improvements in constipation, pain, and feeling stronger. She did well with all core/hip progressions this session. She does require frequent cues to help slow down speed of repetitions and good form. She demonstrates good control when she takes her time and works with breath coordination. Pt would benefit from additional PT to further address deficits.    OBJECTIVE IMPAIRMENTS: decreased coordination, decreased endurance, decreased mobility, decreased strength, increased fascial restrictions, increased muscle spasms, impaired flexibility, improper body mechanics, postural dysfunction, and pain.   ACTIVITY LIMITATIONS: carrying, lifting, sitting, squatting, continence, and locomotion level  PARTICIPATION LIMITATIONS: interpersonal relationship and community activity  PERSONAL FACTORS: Fitness, Time since onset of injury/illness/exacerbation, and 1 comorbidity: medical history  are also affecting patient's functional outcome.   REHAB POTENTIAL: Good  CLINICAL DECISION MAKING: Stable/uncomplicated  EVALUATION COMPLEXITY: Low   GOALS: Goals reviewed with patient? Yes  SHORT TERM GOALS: Target date: 10/27/22 - updated 11/25/22  Pt to be I with HEP. Baseline: Goal status:MET 11/25/22  2.  Pt will report no  more than 4/10 pain due to improvements in posture, strength, and muscle length  Baseline:  Goal status: IN PROGRESS  3.  Pt to be I with voiding mechanics and breathing mechanics to decrease strain at pelvic floor and improve bowel and bladder  habits.  Baseline:  Goal status:MET 11/25/22  4.  Pt to be I with abdominal massage for improved bowel habits.  Baseline:  Goal status: MET 11/25/22  LONG TERM GOALS: Target date: 12/30/21 updated 11/25/22  Pt to be I with advanced HEP.  Baseline:  Goal status: IN PROGRESS  2.  Pt to demonstrate at least 5/5 bil hip strength for improved pelvic stability and functional squats without leakage.  Baseline:  Goal status:IN PROGRESS  3. Pt will report 2/10pain due to improvements in posture, strength, and muscle length  Baseline:  Goal status:IN PROGRESS  4.  Pt will report type 3-4 on average for bowel movements based on stool chart for improved consistency and regularity.  Baseline:  Goal status: IN PROGRESS  5.  Pt to demonstrate minimal to no abdominal coning with core strengthening exercises for squatting for improved load transfer and decreased compensatory strategies.  Baseline:  Goal status: IN PROGRESS   PLAN:  PT FREQUENCY: 1x/week  PT DURATION:  8 sessions  PLANNED INTERVENTIONS: Therapeutic exercises, Therapeutic activity, Neuromuscular re-education, Patient/Family education, Self Care, Joint mobilization, Aquatic Therapy, Dry Needling, Spinal mobilization, Cryotherapy, Moist heat, scar mobilization, Taping, Biofeedback, and Manual therapy  PLAN FOR NEXT SESSION: manual externally, internal assessment if pt consents, hip and core strengthening, hip and spine stretching, breathing and lifting mechanics  (single leg RDL, sidestepping with unilateral weight hold, unilateral overhead press, D2 in reverse table top, squatting with anterior weight hold, reverse sit ups, bridge with single arm press, leg lifts)  Heather Roberts, PT, DPT01/25/243:23 PM

## 2022-12-23 ENCOUNTER — Other Ambulatory Visit: Payer: Self-pay | Admitting: Family Medicine

## 2022-12-23 DIAGNOSIS — Z1231 Encounter for screening mammogram for malignant neoplasm of breast: Secondary | ICD-10-CM

## 2023-01-05 ENCOUNTER — Encounter: Payer: Self-pay | Admitting: Family Medicine

## 2023-01-06 NOTE — Telephone Encounter (Signed)
Records requested

## 2023-01-07 ENCOUNTER — Ambulatory Visit: Payer: BC Managed Care – PPO | Attending: Internal Medicine

## 2023-01-07 DIAGNOSIS — R279 Unspecified lack of coordination: Secondary | ICD-10-CM | POA: Diagnosis present

## 2023-01-07 DIAGNOSIS — R293 Abnormal posture: Secondary | ICD-10-CM

## 2023-01-07 DIAGNOSIS — M6281 Muscle weakness (generalized): Secondary | ICD-10-CM

## 2023-01-07 NOTE — Therapy (Addendum)
OUTPATIENT PHYSICAL THERAPY FEMALE PELVIC TREATMENT   Patient Name: Emily Joseph MRN: [id number] DOB:01/20/1971, 52 y.o., female Today's Date: 01/07/2023  END OF SESSION:  PT End of Session - 01/07/23 0958     Visit Number 6    Date for PT Re-Evaluation 04/01/23    Authorization Type [employer]    PT Start Time 0930    PT Stop Time 1010    PT Time Calculation (min) 40 min    Activity Tolerance Patient tolerated treatment well    Behavior During Therapy [facility] for tasks assessed/performed                Past Medical History:  Diagnosis Date   Adenomyosis    not papanicolaou smear of cervix and cervical HPV   Allergy    Anxiety 06/08/2016   -about her health and about taking medications   Arthritis    ?   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 Trudie Reed in 2017, [place] [facility] -seeing Dr. Jaynee Eagles, Neurologist for back pain with radicular symptoms   Complication of anesthesia    "with epidural bp bottoms out"   Dysrhythmia    fast hr   Eosinophilic esophagitis    Sees Dr. Carlean Purl   Fibromyalgia    GERD (gastroesophageal reflux disease)    H/O laminectomy 09/07/2020   Heart murmur    History of atrial flutter 10/2016   spontaneous conversion to sinus   History of gallstones    History of laparoscopic-assisted vaginal hysterectomy 09/07/2020   History of pneumonia    when pt was pregnant   History of pneumothorax 07/2016   Right   History of sinus tachycardia    Iron deficiency anemia due to chronic blood loss    Menorrhagia, s/p complete hysterectomy, ovaries remain hx   Irritable bowel syndrome 01/27/2013   Dr Carlean Purl 02/2016    Microhematuria    Mouth problem    failed gum graft 1/16   Nasal airway abnormality    Nasal obstruction 06/26/2015   Seen by  ENT 06-26-15.  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   Shortness of breath dyspnea    SUI (stress urinary incontinence, female)    Syncope    with last child with epideral   UTI (urinary tract infection) 07/14/2016   >=100,000 COLONIES/mL KLEBSIELLA PNEUMONIA   Vitamin D deficiency    Past Surgical History:  Procedure Laterality Date   APPENDECTOMY  01/1989   BALLOON DILATION N/A 02/09/2013   Procedure: BALLOON DILATION;  Surgeon: Arta Silence, MD;  Location: [facility];  Service: Endoscopy;  Laterality: N/A;   BILATERAL SALPINGECTOMY N/A 06/14/2014   Procedure: BILATERAL SALPINGECTOMY;  Surgeon: Allena Katz, MD;  Location: [facility];  Service: Gynecology;  Laterality: N/A;   BIOPSY  09/04/2021   Procedure: BIOPSY;  Surgeon: Gatha Mayer, MD;  Location: [facility];  Service: Endoscopy;;   BREAST BIOPSY Right    biopsy    CHOLECYSTECTOMY N/A 11/21/2019   Procedure: LAPAROSCOPIC CHOLECYSTECTOMY WITH INTRAOPERATIVE CHOLANGIOGRAM;  Surgeon: Alphonsa Overall, MD;  Location: [facility];  Service: General;  Laterality: N/A;   COLONOSCOPY WITH PROPOFOL N/A 02/09/2013   Procedure: COLONOSCOPY WITH PROPOFOL;  Surgeon: Arta Silence, MD;  Location: [facility];  Service: Endoscopy;  Laterality: N/A;  need ultra thin colon scope   ESOPHAGEAL DILATION  09/04/2021   Procedure: ESOPHAGEAL DILATION;  Surgeon: Gatha Mayer, MD;  Location: [facility];  Service: Endoscopy;;   ESOPHAGOGASTRODUODENOSCOPY Left 06/30/2021   Procedure: ESOPHAGOGASTRODUODENOSCOPY (EGD);  Surgeon: Lavena Bullion, DO;  Location: [facility];  Service: Gastroenterology;  Laterality: Left;   ESOPHAGOGASTRODUODENOSCOPY (EGD) WITH PROPOFOL N/A 02/09/2013   Procedure: ESOPHAGOGASTRODUODENOSCOPY (EGD) WITH PROPOFOL;  Surgeon: Arta Silence, MD;  Location:  [facility];  Service: Endoscopy;  Laterality: N/A;   ESOPHAGOGASTRODUODENOSCOPY (EGD) WITH PROPOFOL N/A 09/04/2021   Procedure: ESOPHAGOGASTRODUODENOSCOPY (EGD) WITH PROPOFOL;  Surgeon: Gatha Mayer, MD;  Location: [facility];  Service: Endoscopy;  Laterality: N/A;   FOREIGN BODY REMOVAL  06/30/2021   Procedure: FOREIGN BODY REMOVAL;  Surgeon: Lavena Bullion, DO;  Location: [facility];  Service: Gastroenterology;;   LAPAROSCOPIC ASSISTED VAGINAL HYSTERECTOMY Right 06/14/2014   Procedure: OPEN LAPAROSCOPIC ASSISTED VAGINAL HYSTERECTOMY;  Surgeon: Allena Katz, MD;  Location: [facility];  Service: Gynecology;  Laterality: Right;   LYSIS OF ADHESION N/A 06/14/2014   Procedure: LYSIS OF ADHESION;  Surgeon: Allena Katz, MD;  Location: [facility];  Service: Gynecology;  Laterality: N/A;   MINIMALLY INVASIVE EXCISION OF ATRIAL MYXOMA Right 07/11/2016   Procedure: MINIMALLY INVASIVE RESECTION OF RIGHT ATRIAL LIPOMA WITH CLOSURE OF PATENT FORAMEN OVALE;  Surgeon: Rexene Alberts, MD;  Location: [facility];  Service: Open Heart Surgery;  Laterality: Right;   OVARIAN CYST REMOVAL Right 1990   TEE WITHOUT CARDIOVERSION N/A 07/11/2016   Procedure: TRANSESOPHAGEAL ECHOCARDIOGRAM (TEE);  Surgeon: Rexene Alberts, MD;  Location: [facility];  Service: Open Heart Surgery;  Laterality: N/A;   Patient Active Problem List   Diagnosis Date Noted   Pain in right hip 03/12/2022   Esophageal web    Female stress incontinence 09/07/2020   Heart disease 09/07/2020   Primary osteoarthritis of left knee 05/31/2020   At high risk for breast cancer 08/30/2019   Pelvic floor dysfunction 12/28/2018   Chronic RUQ pain - exacerbation 12/28/2018   Atrial flutter with rapid ventricular response ([place]) 10/27/2016   s/p minimally invasive resection of right atrial lipoma    Chronic pain 06/08/2016   Chronic fatigue 06/08/2016   GERD (gastroesophageal reflux disease) with esophagitis 06/08/2016   Palpitations 06/08/2016    Anxiety 06/08/2016   Multiple environmental allergies 03/04/2016   Multiple food allergies 03/04/2016   Irritable bowel syndrome with constipation 01/27/2013   Fibromyalgia 01/07/2007    PCP: Ma Hillock, DO  REFERRING PROVIDER: Gatha Mayer, MD   REFERRING DIAG: K59.09 (ICD-10-CM) - Chronic constipation  Rationale for Evaluation and Treatment: Rehabilitation  THERAPY DIAG:  Muscle weakness (generalized)  Abnormal posture  Unspecified lack of coordination  ONSET DATE: chronic  SUBJECTIVE:  SUBJECTIVE STATEMENT: Pt states that she has had a severe arthritis flare in the last 2 weeks and it has been very hard to do anything, let alone extra exercises. She had one day in which she had to spend all day in bed. She has appointment with MD soon. Up to this point, she had been doing well with all exercises. She states that bowel movements are not good.   PERTINENT HISTORY:  Constipation, DRA, external hemorrhoid, rectocele, OA, fibromyalgia, prior Rt atrial lipoma and flutter, reports she has had Rt ovary cyst removed as a teenager, gallbladder removed 2021, LAVH 2014   Fluid intake: Yes: water - 80-100oz water per day, one cup coffee in morning.     PAIN:  Are you having pain? Yes NPRS scale: 5/10 (globally, all joints today),  Pain location:  bil hip pain is usually the worst pain. Does have current Rt lipoma on hip.   Pain type: aching and tight Pain description: constant   Aggravating factors: lifting laundry, twisting, stress, driving Relieving factors: squatting helps with stretching  PRECAUTIONS: None  WEIGHT BEARING RESTRICTIONS: No  FALLS:  Has patient fallen in last 6 months? 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   PLOF: Independent  PATIENT GOALS: to have less pain, more regular bowels, strengthening DRA   Sexual abuse: No  BOWEL MOVEMENT: Pain with bowel movement: Yes - with hemorrhoids  Type of bowel movement:Type (Bristol Stool Scale) 2-6, Frequency daily  to every two days, and Strain Yes ( a little)  Fully empty rectum: No not all the time Leakage: No Pads: No Fiber supplement: Yes: uses miralax or softener if needed but not daily   URINATION: Pain with urination: No Fully empty bladder: No not always Stream: Strong and Weak Urgency: Yes: sometimes with increased water intake Frequency: not more than every 2 hours Leakage: Coughing and Sneezing only strongly, very small amount  Pads: No  INTERCOURSE: Pain with intercourse: During Penetration and sometimes makes right hip pain worse Ability to have vaginal penetration:  Yes:   Climax: able to climax without pain  Marinoff Scale: 2/3 Menopause has started and does have a lot dryness   PREGNANCY: Vaginal deliveries 4 Tearing No  but episiotomy with first C-section deliveries 0 Currently pregnant No  PROLAPSE: None   OBJECTIVE:  01/07/23: -DRA: 1 finger width separation with distortion -Sit-up test: 0/3 - unable and distortion present with effort -Transversus abdominus: able to activate with cuing - when coached with breathing and chin-up, improved distortion -Tenderbess in Rt upper quadrant with scar tissue restriction -MMT hip strength: 5/5 grossly    DIAGNOSTIC FINDINGS:    COGNITION: Overall cognitive status: Within functional limits for tasks assessed     SENSATION: Light touch: Deficits fingers and feet tingling Proprioception: Appears intact  MUSCLE LENGTH: Bil hamstrings and adductors limited blue bandy 25%     POSTURE: rounded shoulders, forward head, and posterior pelvic tilt   LUMBARAROM/PROM:  A/PROM A/PROM   eval  Flexion WFL  Extension WFL  Right lateral flexion Limited by 25%  Left lateral flexion Limited by 25%  Right rotation WFL  Left rotation WFL   (Blank rows = not tested)  LOWER EXTREMITY ROM:  WFL  LOWER EXTREMITY MMT:  RT hips and adductors limited by 50% Lt hips and adductors limited by 25%  PALPATION:   General  TTP throughout Rt abdominal quadrants with  noted fascial restrictions, Rt external obliques, Rt internal obliques, Rt psoas and with palpation in Rt abdomen pt reports she feels her Rt hip pain replicated                External Perineal Exam deferred until next visit                             Internal Pelvic Floor deferred until next visit  Patient confirms identification and approves PT to assess internal pelvic floor and treatment No  PELVIC MMT:   MMT eval  Vaginal   Internal Anal Sphincter   External Anal Sphincter   Puborectalis   Diastasis Recti Coning noted above umbilicus with active abdominal shoulder lift.    (Blank rows = not tested)        TONE: deferred until next visit  PROLAPSE: deferred until next visit  TODAY'S TREATMENT 01/07/23 RE-EVALUATION Manual: Bowel mobilization Abdominal scar tissue mobilization (Rt upper quadrant) Exercises: Open books 10x bil Bent knee fall out 10x bil Wide knee lower trunk rotation 3 x 10 Therapeutic activities: Squatty potty Self-bowel massage Water intake Tea    TREATMENT 12/04/22 Neuromuscular re-education: Supine ball trunk rotations 2 x 10 Supine table top ball press 2 x 10 Single leg lower 2 x 6 bil Bridge march 2 x 10 Frog bridge 2 x 10 Exercises: Butterfly 2 min (pillow under Rt knee for support) Seated hip IR 2 x 10 yellow loop, one leg at a time Supine piriformis stretch   TREATMENT 11/25/22 Neuromuscular re-education: Pallof press 2 x 10 bil, green band Unilateral extension 2 x 10 bil, green band Lunge with lateral pull, 2 x 10 bil Exercises: Calf stretch  seated Seated forward fold 4 minutes Seated lateral flexion 2 x  20 seconds bil Therapeutic activities: Self-plantar fascia release Self-bowel massage Splinting   PATIENT EDUCATION:  Education details: [id number], abdominal massage, moisturizers Person educated: Patient Education method: Explanation, Demonstration, Tactile cues, Verbal cues, and Handouts Education comprehension: verbalized understanding and returned demonstration  HOME EXERCISE PROGRAM: [id number]  ASSESSMENT:  CLINICAL IMPRESSION: Pt overall making good progress; however, she is having an exacerbation of pain over the last several weeks that is impacting bowel movements. We reviewed helpful strategies to return to more consistent/easier bowel movements, such as squatty potty, warm fluids, tea, bowel massage, water, and regular exercise. She is agreeable to these modifications again. Bowel massage and abdominal scar tissue mobilization performed with good tolerance. She did well with all mobility exercises to help abdominal restriction. She demonstrated great improvements in hip strength, but not with core coordination/strength.  Pt overall doing very well and is seeing improvements in constipation, pain, and feeling stronger. She did well with all core/hip progressions this session. She does require frequent cues to help slow down speed of repetitions and good form. She demonstrates good control when she takes her time and works with breath coordination. Pt would benefit from additional PT to further address deficits.    OBJECTIVE IMPAIRMENTS: decreased coordination, decreased endurance, decreased mobility, decreased strength, increased fascial restrictions, increased muscle spasms, impaired flexibility, improper body mechanics, postural dysfunction, and pain.   ACTIVITY LIMITATIONS: carrying, lifting, sitting, squatting, continence, and locomotion level  PARTICIPATION LIMITATIONS: interpersonal relationship and community  activity  PERSONAL FACTORS: Fitness, Time since onset of injury/illness/exacerbation, and 1 comorbidity: medical history  are also affecting patient's functional outcome.   REHAB POTENTIAL: Good  CLINICAL DECISION MAKING: Stable/uncomplicated  EVALUATION COMPLEXITY:  Low   GOALS: Goals reviewed with patient? Yes  SHORT TERM GOALS: Target date: 10/27/22 - updated 11/25/22 - updated 01/07/23 with new target of 04/01/23  Pt to be I with HEP. Baseline: Goal status:MET 11/25/22  2.  Pt will report no  more than 4/10 pain due to improvements in posture, strength, and muscle length  Baseline:  Goal status: IN PROGRESS  3.  Pt to be I with voiding mechanics and breathing mechanics to decrease strain at pelvic floor and improve bowel and bladder habits.  Baseline:  Goal status:MET 11/25/22  4.  Pt to be I with abdominal massage for improved bowel habits.  Baseline: pt stopped performing - will start again Goal status: IN PROGRESS  LONG TERM GOALS: Target date: 12/30/21 updated 11/25/22 - updated 01/07/23 with new target of 04/01/23  Pt to be I with advanced HEP.  Baseline:  Goal status: IN PROGRESS  2.  Pt to demonstrate at least 5/5 bil hip strength for improved pelvic stability and functional squats without leakage.  Baseline:  Goal status: MET 01/07/23  3. Pt will report 2/10 pain due to improvements in posture, strength, and muscle length  Baseline:  Goal status:IN PROGRESS  4.  Pt will report type 3-4 on average for bowel movements based on stool chart for improved consistency and regularity.  Baseline:  Goal status: IN PROGRESS  5.  Pt to demonstrate minimal to no abdominal coning with core strengthening exercises for squatting for improved load transfer and decreased compensatory strategies.  Baseline:  Goal status: IN PROGRESS   PLAN:  PT FREQUENCY: 1x/week  PT DURATION:  8 sessions  PLANNED INTERVENTIONS: Therapeutic exercises, Therapeutic activity, Neuromuscular  re-education, Patient/Family education, Self Care, Joint mobilization, Aquatic Therapy, Dry Needling, Spinal mobilization, Cryotherapy, Moist heat, scar mobilization, Taping, Biofeedback, and Manual therapy  PLAN FOR NEXT SESSION: manual externally, internal assessment if pt consents, hip and core strengthening, hip and spine stretching, breathing and lifting mechanics  (single leg RDL, sidestepping with unilateral weight hold, unilateral overhead press, D2 in reverse table top, squatting with anterior weight hold, reverse sit ups, bridge with single arm press, leg lifts)  Heather Roberts, PT, DPT02/28/2410:38 AM

## 2023-01-08 NOTE — Progress Notes (Deleted)
Office Visit Note  Patient: Emily Joseph             Date of Birth: Apr 20, 1971           MRN: [id number]             PCP: Ma Hillock, DO Referring: Ma Hillock, DO Visit Date: 01/22/2023 Occupation: '@GUAROCC'$ @  Subjective:    History of Present Illness: Emily Joseph is a 52 y.o. female with history of fibromyalgia, osteoarthritis, and positive ANA.     Activities of Daily Living:  Patient reports morning stiffness for *** {minute/hour:19697}.   Patient {ACTIONS;DENIES/REPORTS:21021675::"Denies"} nocturnal pain.  Difficulty dressing/grooming: {ACTIONS;DENIES/REPORTS:21021675::"Denies"} Difficulty climbing stairs: {ACTIONS;DENIES/REPORTS:21021675::"Denies"} Difficulty getting out of chair: {ACTIONS;DENIES/REPORTS:21021675::"Denies"} Difficulty using hands for taps, buttons, cutlery, and/or writing: {ACTIONS;DENIES/REPORTS:21021675::"Denies"}  No Rheumatology ROS completed.   PMFS History:  Patient Active Problem List   Diagnosis Date Noted  . Pain in right hip 03/12/2022  . Esophageal web   . Female stress incontinence 09/07/2020  . Heart disease 09/07/2020  . Primary osteoarthritis of left knee 05/31/2020  . At high risk for breast cancer 08/30/2019  . Pelvic floor dysfunction 12/28/2018  . Chronic RUQ pain - exacerbation 12/28/2018  . Atrial flutter with rapid ventricular response ([place]) 10/27/2016  . 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 Chronic pain 06/08/2016  . Chronic fatigue 06/08/2016  . GERD (gastroesophageal reflux disease) with esophagitis 06/08/2016  . Palpitations 06/08/2016  . Anxiety 06/08/2016  . Multiple environmental allergies 03/04/2016  . Multiple food allergies 03/04/2016  . Irritable bowel syndrome with constipation 01/27/2013  . Fibromyalgia 01/07/2007    Past Medical History:  Diagnosis Date  . Adenomyosis    not papanicolaou smear of cervix and cervical HPV  . Allergy   . Anxiety 06/08/2016   -about her  health and about taking medications  . Arthritis    ?  Marland Kitchen Asthma   . 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 Chronic fatigue 06/08/2016   -eval with rheum x2, neurology, gastroenterology  . Chronic pain 06/08/2016   -all over her whole life; joints, muscles, head -numerous evaluations, Dr. Trudie Reed in 2017, [place] rheum -seeing Dr. Jaynee Eagles, Neurologist for back pain with radicular symptoms  . Complication of anesthesia    "with epidural bp bottoms out"  . Dysrhythmia    fast hr  . Eosinophilic esophagitis    Sees Dr. Carlean Purl  . Fibromyalgia   . GERD (gastroesophageal reflux disease)   . H/O laminectomy 09/07/2020  . Heart murmur   . History of atrial flutter 10/2016   spontaneous conversion to sinus  . History of gallstones   . History of laparoscopic-assisted vaginal hysterectomy 09/07/2020  . History of pneumonia    when pt was pregnant  . History of pneumothorax 07/2016   Right  . History of sinus tachycardia   . Iron deficiency anemia due to chronic blood loss    Menorrhagia, s/p complete hysterectomy, ovaries remain hx  . Irritable bowel syndrome 01/27/2013   Dr Carlean Purl 02/2016   . Microhematuria   . Mouth problem    failed gum graft 1/16  . Nasal airway abnormality   . Nasal obstruction 06/26/2015   Seen by ENT 06-26-15.  May need sleep study to see if having apnea    . Palpitations   . Pelvic floor dysfunction in female   . 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 Shortness of breath dyspnea   . SUI (stress urinary incontinence, female)   . Syncope    with last child with epideral  . UTI (urinary tract infection) 07/14/2016   >=100,000 COLONIES/mL KLEBSIELLA PNEUMONIA  . Vitamin D deficiency     Family History  Problem Relation Age of Onset  . Cancer Mother   . Breast cancer Mother 83  . Lung cancer Mother   . CAD Father        MI at age 81  . Stroke Father   .  Cancer Father        Cholangiocarcinoma  . Myasthenia gravis Sister   . Chiari malformation Sister   . Hematuria Sister   . Goiter Maternal Grandmother   . Cancer Maternal Grandfather   . Lung cancer Maternal Grandfather   . Rheum arthritis Paternal Grandmother   . Stroke Paternal Grandfather   . Kidney disease Paternal Grandfather   . Alport syndrome Paternal Grandfather   . Polycystic ovary syndrome Daughter        pre-diabetic   . Ehlers-Danlos syndrome Daughter   . Allergies Daughter   . Healthy Daughter   . Healthy Son   . Esophageal cancer Paternal Uncle    Past Surgical History:  Procedure Laterality Date  . APPENDECTOMY  01/1989  . BALLOON DILATION N/A 02/09/2013   Procedure: BALLOON DILATION;  Surgeon: Arta Silence, MD;  [facility]: [facility];  Service: Endoscopy;  Laterality: N/A;  . BILATERAL SALPINGECTOMY N/A 06/14/2014   Procedure: BILATERAL SALPINGECTOMY;  Surgeon: Allena Katz, MD;  Location: [facility];  Service: Gynecology;  Laterality: N/A;  . BIOPSY  09/04/2021   Procedure: BIOPSY;  Surgeon: Gatha Mayer, MD;  [facility];  Service: Endoscopy;;  . BREAST BIOPSY Right    biopsy   . CHOLECYSTECTOMY N/A 11/21/2019   Procedure: LAPAROSCOPIC CHOLECYSTECTOMY WITH INTRAOPERATIVE CHOLANGIOGRAM;  Surgeon: Alphonsa Overall, MD;  [facility]: [facility];  Service: General;  Laterality: N/A;  . COLONOSCOPY WITH PROPOFOL N/A 02/09/2013   Procedure: COLONOSCOPY WITH PROPOFOL;  Surgeon: Arta Silence, MD;  [facility];  Service: Endoscopy;  Laterality: N/A;  need ultra thin colon scope  . ESOPHAGEAL DILATION  09/04/2021   Procedure: ESOPHAGEAL DILATION;  Surgeon: Gatha Mayer, MD;  [facility];  Service: Endoscopy;;  . ESOPHAGOGASTRODUODENOSCOPY Left 06/30/2021   Procedure: ESOPHAGOGASTRODUODENOSCOPY (EGD);  Surgeon: Lavena Bullion, DO;  [facility];  Service: Gastroenterology;  Laterality: Left;  . ESOPHAGOGASTRODUODENOSCOPY (EGD)  WITH PROPOFOL N/A 02/09/2013   Procedure: ESOPHAGOGASTRODUODENOSCOPY (EGD) WITH PROPOFOL;  Surgeon: Arta Silence, MD;  [facility];  Service: Endoscopy;  Laterality: N/A;  . ESOPHAGOGASTRODUODENOSCOPY (EGD) WITH PROPOFOL N/A 09/04/2021   Procedure: ESOPHAGOGASTRODUODENOSCOPY (EGD) WITH PROPOFOL;  Surgeon: Gatha Mayer, MD;  [facility];  Service: Endoscopy;  Laterality: N/A;  . FOREIGN BODY REMOVAL  06/30/2021   Procedure: FOREIGN BODY REMOVAL;  Surgeon: Lavena Bullion, DO;  Location: [place];  Service: Gastroenterology;;  . LAPAROSCOPIC ASSISTED VAGINAL HYSTERECTOMY Right 06/14/2014   Procedure: OPEN LAPAROSCOPIC ASSISTED VAGINAL HYSTERECTOMY;  Surgeon: Allena Katz, MD;  Location: [facility];  Service: Gynecology;  Laterality: Right;  . LYSIS OF ADHESION N/A 06/14/2014   Procedure: LYSIS OF ADHESION;  Surgeon: Allena Katz, MD;  Location: [facility];  Service: Gynecology;  Laterality: N/A;  . MINIMALLY INVASIVE EXCISION OF ATRIAL MYXOMA Right 07/11/2016   Procedure: MINIMALLY INVASIVE  RESECTION OF RIGHT ATRIAL LIPOMA WITH CLOSURE OF PATENT FORAMEN OVALE;  Surgeon: Rexene Alberts, MD;  Location: [place];  Service: Open Heart Surgery;  Laterality: Right;  . OVARIAN CYST REMOVAL Right 1990  . TEE WITHOUT CARDIOVERSION N/A 07/11/2016   Procedure: TRANSESOPHAGEAL ECHOCARDIOGRAM (TEE);  Surgeon: Rexene Alberts, MD;  Location: [place];  Service: Open Heart Surgery;  Laterality: N/A;   Social History   Social History Narrative   Social History:   Now stays at home -  feels can barely function just to keep house and take care of  4 children. Husband travels and works a lot.   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      [name] - husband; [phone] -daughter; [name]- son [name] 2003 daughter [name] 2008 daughter    Healthy diet - lots of food allergies. Avoiding gluten  currently.      Of note, at age 35 she had a very traumatic medical experience. She apparently was in the hospital for some time and had many needlesticks, many CT scans and surgery for an ovarian mass. This was very traumatizing for her. She still gets anxiety when she is going to see a doctor or health care provider. She had a flashback to this time when she went for acupuncture.        -Wears a bicycle helmet riding a bike: Yes     -smoke alarm in the home:Yes     - wears seatbelt: Yes     - Feels safe in their relationships: Yes   Immunization History  Administered Date(s) Administered  . Influenza Whole 09/09/2007  . Tdap 04/30/2011, 11/05/2021     Objective: Vital Signs: LMP 05/20/2014 (Approximate)    Physical Exam Vitals and nursing note reviewed.  Constitutional:      Appearance: She is well-developed.  HENT:     Head: Normocephalic and atraumatic.  Eyes:     Conjunctiva/sclera: Conjunctivae normal.  Cardiovascular:     Rate and Rhythm: Normal rate and regular rhythm.     Heart sounds: Normal heart sounds.  Pulmonary:     Effort: Pulmonary effort is normal.     Breath sounds: Normal breath sounds.  Abdominal:     General: Bowel sounds are normal.     Palpations: Abdomen is soft.  Musculoskeletal:     Cervical back: Normal range of motion.  Skin:    General: Skin is warm and dry.     Capillary Refill: Capillary refill takes less than 2 seconds.  Neurological:     Mental Status: She is alert and oriented to person, place, and time.  Psychiatric:        Behavior: Behavior normal.     Musculoskeletal Exam: ***  CDAI Exam: CDAI Score: -- Patient Global: --; Provider Global: -- Swollen: --; Tender: -- Joint Exam 01/22/2023   No joint exam has been documented for this visit   There is currently no information documented on the homunculus. Go to the Rheumatology activity and complete the homunculus joint exam.  Investigation: No additional  findings.  Imaging: No results found.  Recent Labs: Lab Results  Component Value Date   WBC 8.0 05/05/2022   HGB 13.2 05/05/2022   PLT 283.0 05/05/2022   NA 138 05/05/2022   K 3.7 05/05/2022   CL 102 05/05/2022   CO2 25 05/05/2022   GLUCOSE 108 (H) 05/05/2022  BUN 11 05/05/2022   CREATININE 0.75 05/05/2022   BILITOT 0.7 05/05/2022   ALKPHOS 75 05/05/2022   AST 19 05/05/2022   ALT 27 05/05/2022   PROT 7.0 05/05/2022   ALBUMIN 4.2 05/05/2022   CALCIUM 9.2 05/05/2022   GFRAA >60 11/16/2019    Speciality Comments: No specialty comments available.  Procedures:  No procedures performed Allergies: Avocado, Macadamia nut oil, Mango flavor, Other, Ciprofloxacin, Demerol [meperidine], Iodinated contrast media, Macrobid [nitrofurantoin], Betadine [povidone iodine], Latex, Dulcolax [bisacodyl], Sulfamethoxazole, and Tape   Assessment / Plan:     Visit Diagnoses: Fibromyalgia  Lateral epicondylitis of both elbows  Chronic fatigue  Primary osteoarthritis of left knee  Trochanteric bursitis of both hips  Positive ANA (antinuclear antibody)  Irritable bowel syndrome with constipation  Neuralgia  Pelvic floor dysfunction  Multiple environmental allergies  Multiple food allergies  Gastroesophageal reflux disease with esophagitis without hemorrhage  Anxiety  s/p minimally invasive resection of right atrial lipoma  Orders: No orders of the defined types were placed in this encounter.  No orders of the defined types were placed in this encounter.   Face-to-face time spent with patient was *** minutes. Greater than 50% of time was spent in counseling and coordination of care.  Follow-Up Instructions: No follow-ups on file.   Ofilia Neas, PA-C  Note - This record has been created using Dragon software.  Chart creation errors have been sought, but may not always  have been located. Such creation errors do not reflect on  the standard of medical care.,

## 2023-01-16 NOTE — Progress Notes (Signed)
HPI: FU atrial lipoma. Patient was seen previously for dyspnea and echocardiogram showed right atrial mass. Coronary CTA August 2017 showed a calcium score of 0 and no coronary artery disease. There was a large right atrial mass. Patient subsequently had resection of right atrial lipoma and repair of patent foramen ovale. Patient was admitted in December 2017 with new onset atrial flutter. She converted to sinus spontaneously. TSH was normal. She was not anticoagulated as her only embolic risk factor was female sex ([place] 1).  Monitor November 2018 showed sinus to sinus tachycardia with occasional PACs, PVCs and brief PAT.  Last echocardiogram 11/21 showed normal LV function and mild tricuspid regurgitation. Since last seen, she has dyspnea with more vigorous activities but not routine activities.  No chest pain.  Occasional palpitations but she takes Toprol as needed and this is unchanged.  No syncope.  Current Outpatient Medications  Medication Sig Dispense Refill   albuterol (VENTOLIN HFA) 108 (90 Base) MCG/ACT inhaler Inhale 2 puffs into the lungs as needed for wheezing or shortness of breath.     Azelastine-Fluticasone 137-50 MCG/ACT SUSP Place 1 spray into both nostrils 2 (two) times daily.     Bromelains (BROMELAIN PO) Take 1 tablet by mouth. Three-four times weekly     cetirizine (ZYRTEC) 10 MG tablet Take 10 mg by mouth daily.     Cholecalciferol (VITAMIN D3) 125 MCG (5000 UT) TABS Take 5,000 Units by mouth daily.     EPINEPHrine 0.3 mg/0.3 mL IJ SOAJ injection Inject 0.3 mg into the muscle as needed for anaphylaxis.     fexofenadine (ALLEGRA ODT) 30 MG disintegrating tablet Take 30 mg by mouth daily.     Glycerin-Hypromellose-PEG 400 (DRY EYE RELIEF DROPS) 0.2-0.2-1 % SOLN Place 1 drop into both eyes daily.     ketotifen (ZADITOR) 0.025 % ophthalmic solution 1 drop 2 (two) times daily.     Menaquinone-7 (K2 PO) Take 100 mcg by mouth.     Methylcobalamin (METHYL B-12 PO) Take by  mouth as needed.     metoprolol tartrate (LOPRESSOR) 25 MG tablet Take 0.5 tablets (12.5 mg total) by mouth 2 (two) times daily. 90 tablet 3   Polyethyl Glycol-Propyl Glycol (SYSTANE OP) Place 1 drop into both eyes 2 (two) times daily.     polyethylene glycol (MIRALAX / GLYCOLAX) 17 g packet Take 17 g by mouth as needed.     Probiotic Product (PROBIOTIC ADVANCED PO) Take 1 tablet by mouth 2 (two) times daily. Garden of life Children chewable     Wheat Dextrin (BENEFIBER) CHEW Chew 1-2 tablets by mouth at bedtime.     No current facility-administered medications for this visit.     Past Medical History:  Diagnosis Date   Adenomyosis    not papanicolaou smear of cervix and cervical HPV   Allergy    Anxiety 06/08/2016   -about her health and about taking medications   Arthritis    ?   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 Trudie Reed in 2017, [place] rhe[facility] -seeing Dr. Jaynee Eagles, Neurologist for back pain with radicular symptoms   Complication of anesthesia    "with epidural bp bottoms out"   Dysrhythmia    fast hr   Eosinophilic esophagitis  Sees Dr. Carlean Purl   Fibromyalgia    GERD (gastroesophageal reflux disease)    H/O laminectomy 09/07/2020   Heart murmur    History of atrial flutter 10/2016   spontaneous conversion to sinus   History of gallstones    History of laparoscopic-assisted vaginal hysterectomy 09/07/2020   History of pneumonia    when pt was pregnant   History of pneumothorax 07/2016   Right   History of sinus tachycardia    Iron deficiency anemia due to chronic blood loss    Menorrhagia, s/p complete hysterectomy, ovaries remain hx   Irritable bowel syndrome 01/27/2013   Dr Carlean Purl 02/2016    Microhematuria    Mouth problem    failed gum graft 1/16    Nasal airway abnormality    Nasal obstruction 06/26/2015   Seen by ENT 06-26-15.  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   Shortness of breath dyspnea    SUI (stress urinary incontinence, female)    Syncope    with last child with epideral   UTI (urinary tract infection) 07/14/2016   >=100,000 COLONIES/mL KLEBSIELLA PNEUMONIA   Vitamin D deficiency     Past Surgical History:  Procedure Laterality Date   APPENDECTOMY  01/1989   BALLOON DILATION N/A 02/09/2013   Procedure: BALLOON DILATION;  Surgeon: Arta Silence, MD;  [facility];  Service: Endoscopy;  Laterality: N/A;   BILATERAL SALPINGECTOMY N/A 06/14/2014   Procedure: BILATERAL SALPINGECTOMY;  Surgeon: Allena Katz, MD;  Location: [facility] [facility];  Service: Gynecology;  Laterality: N/A;   BIOPSY  09/04/2021   Procedure: BIOPSY;  Surgeon: Gatha Mayer, MD;  [facility];  Service: Endoscopy;;   BREAST BIOPSY Right    biopsy    CHOLECYSTECTOMY N/A 11/21/2019   Procedure: LAPAROSCOPIC CHOLECYSTECTOMY WITH INTRAOPERATIVE CHOLANGIOGRAM;  Surgeon: Alphonsa Overall, MD;  [facility];  Service: General;  Laterality: N/A;   COLONOSCOPY WITH PROPOFOL N/A 02/09/2013   Procedure: COLONOSCOPY WITH PROPOFOL;  Surgeon: Arta Silence, MD;  [facility];  Service: Endoscopy;  Laterality: N/A;  need ultra thin colon scope   ESOPHAGEAL DILATION  09/04/2021   Procedure: ESOPHAGEAL DILATION;  Surgeon: Gatha Mayer, MD;  [facility];  Service: Endoscopy;;   ESOPHAGOGASTRODUODENOSCOPY Left 06/30/2021   Procedure: ESOPHAGOGASTRODUODENOSCOPY (EGD);  Surgeon: Lavena Bullion, DO;  Location: [facility];  Service: Gastroenterology;  Laterality: Left;   ESOPHAGOGASTRODUODENOSCOPY (EGD) WITH PROPOFOL N/A 02/09/2013   Procedure:  ESOPHAGOGASTRODUODENOSCOPY (EGD) WITH PROPOFOL;  Surgeon: Arta Silence, MD;  [facility];  Service: Endoscopy;  Laterality: N/A;   ESOPHAGOGASTRODUODENOSCOPY (EGD) WITH PROPOFOL N/A 09/04/2021   Procedure: ESOPHAGOGASTRODUODENOSCOPY (EGD) WITH PROPOFOL;  Surgeon: Gatha Mayer, MD;  [facility];  Service: Endoscopy;  Laterality: N/A;   FOREIGN BODY REMOVAL  06/30/2021   Procedure: FOREIGN BODY REMOVAL;  Surgeon: Lavena Bullion, DO;  Location: [place] [facility];  Service: Gastroenterology;;   LAPAROSCOPIC ASSISTED VAGINAL HYSTERECTOMY Right 06/14/2014   Procedure: OPEN LAPAROSCOPIC ASSISTED VAGINAL HYSTERECTOMY;  Surgeon: Allena Katz, MD;  Location: [facility] [facility];  Service: Gynecology;  Laterality: Right;   LYSIS OF ADHESION N/A 06/14/2014   Procedure: LYSIS OF ADHESION;  Surgeon: Allena Katz, MD;  Location: [facility];  Service: Gynecology;  Laterality: N/A;  MINIMALLY INVASIVE EXCISION OF ATRIAL MYXOMA Right 07/11/2016   Procedure: MINIMALLY INVASIVE RESECTION OF RIGHT ATRIAL LIPOMA WITH CLOSURE OF PATENT FORAMEN OVALE;  Surgeon: Rexene Alberts, MD;  Location: [place];  Service: Open Heart Surgery;  Laterality: Right;   OVARIAN CYST REMOVAL Right 1990   TEE WITHOUT CARDIOVERSION N/A 07/11/2016   Procedure: TRANSESOPHAGEAL ECHOCARDIOGRAM (TEE);  Surgeon: Rexene Alberts, MD;  Location: [place];  Service: Open Heart Surgery;  Laterality: N/A;    Social History   Socioeconomic History   Marital status: Married    Spouse name: [name]    Number of children: 4   Years of education: 12+   Highest education level: Not on file  Occupational History   Not on file  Tobacco Use   Smoking status: Never    Passive exposure: Never   Smokeless tobacco: Never  Vaping Use   Vaping Use: Never used  Substance and Sexual Activity   Alcohol use: Yes    Alcohol/week: 0.0 standard drinks of alcohol    Comment: 3 glasses wine per week or less    Drug use: No   Sexual activity: Yes     Partners: Male    Birth control/protection: Surgical  Other Topics Concern   Not on file  Social History Narrative   Social History:   Now stays at home -  feels can barely function just to keep house and take care of  4 children. Husband travels and works a lot.   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      [name] - husband; [phone] -daughter; [name]- son [name] 2003 daughter [name] 2008 daughter    Healthy diet - lots of food allergies. Avoiding gluten currently.      Of note, at age 64 she had a very traumatic medical experience. She apparently was in the hospital for some time and had many needlesticks, many CT scans and surgery for an ovarian mass. This was very traumatizing for her. She still gets anxiety when she is going to see a doctor or health care provider. She had a flashback to this time when she went for acupuncture.        -Wears a bicycle helmet riding a bike: Yes     -smoke alarm in the home:Yes     - wears seatbelt: Yes     - Feels safe in their relationships: Yes   Social Determinants of Health   Financial Resource Strain: Not on file  Food Insecurity: Not on file  Transportation Needs: Not on file  Physical Activity: Not on file  Stress: Not on file  Social Connections: Not on file  Intimate Partner Violence: Not on file    Family History  Problem Relation Age of Onset   Cancer Mother    Breast cancer Mother 87   Lung cancer Mother    CAD Father        MI at age 57   Stroke Father    Cancer Father        Cholangiocarcinoma   Myasthenia gravis Sister    Chiari malformation Sister    Hematuria Sister    Goiter Maternal Grandmother    Cancer Maternal Grandfather    Lung cancer Maternal Grandfather    Rheum arthritis Paternal Grandmother    Stroke Paternal Grandfather    Kidney disease Paternal Grandfather    Alport syndrome Paternal Grandfather  Polycystic ovary syndrome Daughter        pre-diabetic    Ehlers-Danlos syndrome Daughter    Allergies Daughter    Healthy Daughter    Healthy Son    Esophageal cancer Paternal Uncle     ROS: no fevers or chills, productive cough, hemoptysis, dysphasia, odynophagia, melena, hematochezia, dysuria, hematuria, rash, seizure activity, orthopnea, PND, pedal edema, claudication. Remaining systems are negative.  Physical Exam: Well-developed well-nourished in no acute distress.  Skin is warm and dry.  HEENT is normal.  Neck is supple.  Chest is clear to auscultation with normal expansion.  Cardiovascular exam is regular rate and rhythm.  Abdominal exam nontender or distended. No masses palpated. Extremities show no edema. neuro grossly intact   A/P  1 history of right atrial lipoma-status post resection.  Will schedule follow-up echocardiogram to assess for recurrence.  2 history of palpitations-symptoms are reasonly well-controlled.  Will continue beta-blocker as needed.  She had fatigue on running dose in the past.  3 history of atrial flutter-patient remains in sinus rhythm.  CHA2DS2-VASc is 1 for female sex.  She is not anticoagulated.  Kirk Ruths, MD

## 2023-01-21 ENCOUNTER — Ambulatory Visit: Payer: BC Managed Care – PPO | Attending: Internal Medicine

## 2023-01-21 DIAGNOSIS — M6281 Muscle weakness (generalized): Secondary | ICD-10-CM | POA: Insufficient documentation

## 2023-01-21 DIAGNOSIS — R293 Abnormal posture: Secondary | ICD-10-CM | POA: Insufficient documentation

## 2023-01-21 DIAGNOSIS — R279 Unspecified lack of coordination: Secondary | ICD-10-CM | POA: Diagnosis present

## 2023-01-21 NOTE — Therapy (Signed)
OUTPATIENT PHYSICAL THERAPY FEMALE PELVIC TREATMENT   Patient Name: Emily Joseph MRN: [id number] DOB:[birth year], 52 y.o., female Today's Date: 01/21/2023  END OF SESSION:  PT End of Session - 01/21/23 0932     Visit Number 7    Date for PT Re-Evaluation 04/01/23    Authorization Type [employer]    PT Start Time 0930    PT Stop Time 1010    PT Time Calculation (min) 40 min    Activity Tolerance Patient tolerated treatment well    Behavior During Therapy [facility] for tasks assessed/performed                Past Medical History:  Diagnosis Date   Adenomyosis    not papanicolaou smear of cervix and cervical HPV   Allergy    Anxiety 06/08/2016   -about her health and about taking medications   Arthritis    ?   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 Trudie Reed in 2017, [place] rheum -seeing Dr. Jaynee Eagles, Neurologist for back pain with radicular symptoms   Complication of anesthesia    "with epidural bp bottoms out"   Dysrhythmia    fast hr   Eosinophilic esophagitis    Sees Dr. Carlean Purl   Fibromyalgia    GERD (gastroesophageal reflux disease)    H/O laminectomy 09/07/2020   Heart murmur    History of atrial flutter 10/2016   spontaneous conversion to sinus   History of gallstones    History of laparoscopic-assisted vaginal hysterectomy 09/07/2020   History of pneumonia    when pt was pregnant   History of pneumothorax 07/2016   Right   History of sinus tachycardia    Iron deficiency anemia due to chronic blood loss    Menorrhagia, s/p complete hysterectomy, ovaries remain hx   Irritable bowel syndrome 01/27/2013   Dr Carlean Purl 02/2016    Microhematuria    Mouth problem    failed gum graft 1/16   Nasal airway abnormality    Nasal obstruction 06/26/2015   Seen by  ENT 06-26-15.  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   Shortness of breath dyspnea    SUI (stress urinary incontinence, female)    Syncope    with last child with epideral   UTI (urinary tract infection) 07/14/2016   >=100,000 COLONIES/mL KLEBSIELLA PNEUMONIA   Vitamin D deficiency    Past Surgical History:  Procedure Laterality Date   APPENDECTOMY  01/1989   BALLOON DILATION N/A 02/09/2013   Procedure: BALLOON DILATION;  Surgeon: Arta Silence, MD;  Location: [facility];  Service: Endoscopy;  Laterality: N/A;   BILATERAL SALPINGECTOMY N/A 06/14/2014   Procedure: BILATERAL SALPINGECTOMY;  Surgeon: Allena Katz, MD;  Location: [place] [facility];  Service: Gynecology;  Laterality: N/A;   BIOPSY  09/04/2021   Procedure: BIOPSY;  Surgeon: Gatha Mayer, MD;  Location: [facility];  Service: Endoscopy;;   BREAST BIOPSY Right    biopsy    CHOLECYSTECTOMY N/A 11/21/2019   Procedure: LAPAROSCOPIC CHOLECYSTECTOMY WITH INTRAOPERATIVE CHOLANGIOGRAM;  Surgeon: Alphonsa Overall, MD;  Location: [facility];  Service: General;  Laterality: N/A;   COLONOSCOPY WITH PROPOFOL N/A 02/09/2013   Procedure: COLONOSCOPY WITH PROPOFOL;  Surgeon: Arta Silence, MD;  Location: [facility];  Service: Endoscopy;  Laterality: N/A;  need ultra thin colon scope   ESOPHAGEAL DILATION  09/04/2021   Procedure: ESOPHAGEAL DILATION;  Surgeon: Gatha Mayer, MD;  Location: [facility];  Service: Endoscopy;;   ESOPHAGOGASTRODUODENOSCOPY Left 06/30/2021   Procedure: ESOPHAGOGASTRODUODENOSCOPY (EGD);  Surgeon: Lavena Bullion, DO;  Location: [facility];  Service: Gastroenterology;  Laterality: Left;   ESOPHAGOGASTRODUODENOSCOPY (EGD) WITH PROPOFOL N/A 02/09/2013   Procedure: ESOPHAGOGASTRODUODENOSCOPY (EGD) WITH PROPOFOL;  Surgeon: Arta Silence, MD;  Location:  [facility];  Service: Endoscopy;  Laterality: N/A;   ESOPHAGOGASTRODUODENOSCOPY (EGD) WITH PROPOFOL N/A 09/04/2021   Procedure: ESOPHAGOGASTRODUODENOSCOPY (EGD) WITH PROPOFOL;  Surgeon: Gatha Mayer, MD;  Location: [facility];  Service: Endoscopy;  Laterality: N/A;   FOREIGN BODY REMOVAL  06/30/2021   Procedure: FOREIGN BODY REMOVAL;  Surgeon: Lavena Bullion, DO;  Location: [place] [facility];  Service: Gastroenterology;;   LAPAROSCOPIC ASSISTED VAGINAL HYSTERECTOMY Right 06/14/2014   Procedure: OPEN LAPAROSCOPIC ASSISTED VAGINAL HYSTERECTOMY;  Surgeon: Allena Katz, MD;  Location: [place] [facility];  Service: Gynecology;  Laterality: Right;   LYSIS OF ADHESION N/A 06/14/2014   Procedure: LYSIS OF ADHESION;  Surgeon: Allena Katz, MD;  Location: [place];  Service: Gynecology;  Laterality: N/A;   MINIMALLY INVASIVE EXCISION OF ATRIAL MYXOMA Right 07/11/2016   Procedure: MINIMALLY INVASIVE RESECTION OF RIGHT ATRIAL LIPOMA WITH CLOSURE OF PATENT FORAMEN OVALE;  Surgeon: Rexene Alberts, MD;  Location: [place];  Service: Open Heart Surgery;  Laterality: Right;   OVARIAN CYST REMOVAL Right 1990   TEE WITHOUT CARDIOVERSION N/A 07/11/2016   Procedure: TRANSESOPHAGEAL ECHOCARDIOGRAM (TEE);  Surgeon: Rexene Alberts, MD;  Location: [place];  Service: Open Heart Surgery;  Laterality: N/A;   Patient Active Problem List   Diagnosis Date Noted   Pain in right hip 03/12/2022   Esophageal web    Female stress incontinence 09/07/2020   Heart disease 09/07/2020   Primary osteoarthritis of left knee 05/31/2020   At high risk for breast cancer 08/30/2019   Pelvic floor dysfunction 12/28/2018   Chronic RUQ pain - exacerbation 12/28/2018   Atrial flutter with rapid ventricular response ([place]) 10/27/2016   s/p minimally invasive resection of right atrial lipoma    Chronic pain 06/08/2016   Chronic fatigue 06/08/2016   GERD (gastroesophageal reflux disease) with esophagitis 06/08/2016   Palpitations 06/08/2016    Anxiety 06/08/2016   Multiple environmental allergies 03/04/2016   Multiple food allergies 03/04/2016   Irritable bowel syndrome with constipation 01/27/2013   Fibromyalgia 01/07/2007    PCP: Ma Hillock, DO  REFERRING PROVIDER: Gatha Mayer, MD   REFERRING DIAG: K59.09 (ICD-10-CM) - Chronic constipation  Rationale for Evaluation and Treatment: Rehabilitation  THERAPY DIAG:  Muscle weakness (generalized)  Abnormal posture  Unspecified lack of coordination  ONSET DATE: chronic  SUBJECTIVE:  SUBJECTIVE STATEMENT: Pt states that she has just gotten back from long trip out [place] and she is feeling very tired. She did try smooth move teat and feels like it was very helpful. She feels like her body responds to these types of things slower than normal.    PERTINENT HISTORY:  Constipation, DRA, external hemorrhoid, rectocele, OA, fibromyalgia, prior Rt atrial lipoma and flutter, reports she has had Rt ovary cyst removed as a teenager, gallbladder removed 2021, LAVH 2014   Fluid intake: Yes: water - 80-100oz water per day, one cup coffee in morning.     PAIN:  Are you having pain? Yes NPRS scale: 5/10 (globally, all joints today),  Pain location:  bil hip pain is usually the worst pain. Does have current Rt lipoma on hip.   Pain type: aching and tight Pain description: constant   Aggravating factors: lifting laundry, twisting, stress, driving Relieving factors: squatting helps with stretching  PRECAUTIONS: None  WEIGHT BEARING RESTRICTIONS: No  FALLS:  Has patient fallen in last 6 months? 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   PLOF: Independent  PATIENT GOALS: to have  less pain, more regular bowels, strengthening DRA   Sexual abuse: No  BOWEL MOVEMENT: Pain with bowel movement: Yes - with hemorrhoids  Type of bowel movement:Type (Bristol Stool Scale) 2-6, Frequency daily  to every two days, and Strain Yes ( a little)  Fully empty rectum: No not all the time Leakage: No Pads: No Fiber supplement: Yes: uses miralax or softener if needed but not daily   URINATION: Pain with urination: No Fully empty bladder: No not always Stream: Strong and Weak Urgency: Yes: sometimes with increased water intake Frequency: not more than every 2 hours Leakage: Coughing and Sneezing only strongly, very small amount  Pads: No  INTERCOURSE: Pain with intercourse: During Penetration and sometimes makes right hip pain worse Ability to have vaginal penetration:  Yes:   Climax: able to climax without pain  Marinoff Scale: 2/3 Menopause has started and does have a lot dryness   PREGNANCY: Vaginal deliveries 4 Tearing No  but episiotomy with first C-section deliveries 0 Currently pregnant No  PROLAPSE: None   OBJECTIVE:  01/07/23: -DRA: 1 finger width separation with distortion -Sit-up test: 0/3 - unable and distortion present with effort -Transversus abdominus: able to activate with cuing - when coached with breathing and chin-up, improved distortion -Tenderbess in Rt upper quadrant with scar tissue restriction -MMT hip strength: 5/5 grossly    DIAGNOSTIC FINDINGS:    COGNITION: Overall cognitive status: Within functional limits for tasks assessed     SENSATION: Light touch: Deficits fingers and feet tingling Proprioception: Appears intact  MUSCLE LENGTH: Bil hamstrings and adductors limited blue bandy 25%     POSTURE: rounded shoulders, forward head, and posterior pelvic tilt   LUMBARAROM/PROM:  A/PROM A/PROM  eval  Flexion WFL  Extension WFL  Right lateral flexion Limited by 25%  Left lateral flexion Limited by 25%  Right rotation  WFL  Left rotation WFL   (Blank rows = not tested)  LOWER EXTREMITY ROM:  WFL  LOWER EXTREMITY MMT:  RT hips and adductors limited by 50% Lt hips and adductors limited by 25%  PALPATION:   General  TTP throughout Rt abdominal quadrants with noted fascial restrictions, Rt external obliques, Rt internal obliques, Rt psoas and with palpation in Rt abdomen pt reports she feels  her Rt hip pain replicated                External Perineal Exam deferred until next visit                             Internal Pelvic Floor deferred until next visit  Patient confirms identification and approves PT to assess internal pelvic floor and treatment No  PELVIC MMT:   MMT eval  Vaginal   Internal Anal Sphincter   External Anal Sphincter   Puborectalis   Diastasis Recti Coning noted above umbilicus with active abdominal shoulder lift.    (Blank rows = not tested)        TONE: deferred until next visit  PROLAPSE: deferred until next visit  TODAY'S TREATMENT 01/21/23 Neuromuscular re-education: Supine UE ball press 2 x 10 Sidelying UE ball press 2 x 10 bil Pelvic tilts 2 x 10 Exercises: Seated hip IR 2 x 10 yellow loop, one leg at a time Seated hip IR 2 x 10 yellow look bil Seated hip abduction 2 x 10 yellow loop Seated hip adduction 2 x 10 ball squeeze    TREATMENT 01/07/23 RE-EVALUATION Manual: Bowel mobilization Abdominal scar tissue mobilization (Rt upper quadrant) Exercises: Open books 10x bil Bent knee fall out 10x bil Wide knee lower trunk rotation 3 x 10 Therapeutic activities: Squatty potty Self-bowel massage Water intake Tea    TREATMENT 12/04/22 Neuromuscular re-education: Supine ball trunk rotations 2 x 10 Supine table top ball press 2 x 10 Single leg lower 2 x 6 bil Bridge march 2 x 10 Frog bridge 2 x 10 Exercises: Butterfly 2 min (pillow under Rt knee for support) Seated hip IR 2 x 10 yellow loop, one leg at a time Supine piriformis stretch   PATIENT  EDUCATION:  Education details: [id number], abdominal massage, moisturizers Person educated: Patient Education method: Explanation, Demonstration, Tactile cues, Verbal cues, and Handouts Education comprehension: verbalized understanding and returned demonstration  HOME EXERCISE PROGRAM: [id number]  ASSESSMENT:  CLINICAL IMPRESSION: Pt doing much better this week having more regular bowel movements. Believe tea and bowel massage are helping. We reviewed HEP with cuing for improved core motor control. Believe improved bowel movements will help decrease generalized pain and allow her better access to core strength and support. Pt would benefit from additional PT to further address deficits.    OBJECTIVE IMPAIRMENTS: decreased coordination, decreased endurance, decreased mobility, decreased strength, increased fascial restrictions, increased muscle spasms, impaired flexibility, improper body mechanics, postural dysfunction, and pain.   ACTIVITY LIMITATIONS: carrying, lifting, sitting, squatting, continence, and locomotion level  PARTICIPATION LIMITATIONS: interpersonal relationship and community activity  PERSONAL FACTORS: Fitness, Time since onset of injury/illness/exacerbation, and 1 comorbidity: medical history  are also affecting patient's functional outcome.   REHAB POTENTIAL: Good  CLINICAL DECISION MAKING: Stable/uncomplicated  EVALUATION COMPLEXITY: Low   GOALS: Goals reviewed with patient? Yes  SHORT TERM GOALS: Target date: 10/27/22 - updated 11/25/22 - updated 01/07/23 with new target of 04/01/23  Pt to be I with HEP. Baseline: Goal status:MET 11/25/22  2.  Pt will report no  more than 4/10 pain due to improvements in posture, strength, and muscle length  Baseline:  Goal status: IN PROGRESS  3.  Pt to be I with voiding mechanics and breathing mechanics to decrease strain at pelvic floor and improve bowel and bladder habits.  Baseline:  Goal status:MET 11/25/22  4.  Pt to be  I with abdominal  massage for improved bowel habits.  Baseline: pt stopped performing - will start again Goal status: IN PROGRESS  LONG TERM GOALS: Target date: 12/30/21 updated 11/25/22 - updated 01/07/23 with new target of 04/01/23  Pt to be I with advanced HEP.  Baseline:  Goal status: IN PROGRESS  2.  Pt to demonstrate at least 5/5 bil hip strength for improved pelvic stability and functional squats without leakage.  Baseline:  Goal status: MET 01/07/23  3. Pt will report 2/10 pain due to improvements in posture, strength, and muscle length  Baseline:  Goal status:IN PROGRESS  4.  Pt will report type 3-4 on average for bowel movements based on stool chart for improved consistency and regularity.  Baseline:  Goal status: IN PROGRESS  5.  Pt to demonstrate minimal to no abdominal coning with core strengthening exercises for squatting for improved load transfer and decreased compensatory strategies.  Baseline:  Goal status: IN PROGRESS   PLAN:  PT FREQUENCY: 1x/week  PT DURATION:  8 sessions  PLANNED INTERVENTIONS: Therapeutic exercises, Therapeutic activity, Neuromuscular re-education, Patient/Family education, Self Care, Joint mobilization, Aquatic Therapy, Dry Needling, Spinal mobilization, Cryotherapy, Moist heat, scar mobilization, Taping, Biofeedback, and Manual therapy  PLAN FOR NEXT SESSION: manual externally, internal assessment if pt consents, hip and core strengthening, hip and spine stretching, breathing and lifting mechanics  (single leg RDL, sidestepping with unilateral weight hold, unilateral overhead press, D2 in reverse table top, squatting with anterior weight hold, reverse sit ups, bridge with single arm press, leg lifts)  Heather Roberts, PT, DPT03/13/2410:06 AM

## 2023-01-22 ENCOUNTER — Ambulatory Visit: Payer: BC Managed Care – PPO | Admitting: Physician Assistant

## 2023-01-22 DIAGNOSIS — M792 Neuralgia and neuritis, unspecified: Secondary | ICD-10-CM

## 2023-01-22 DIAGNOSIS — F419 Anxiety disorder, unspecified: Secondary | ICD-10-CM

## 2023-01-22 DIAGNOSIS — M7061 Trochanteric bursitis, right hip: Secondary | ICD-10-CM

## 2023-01-22 DIAGNOSIS — K581 Irritable bowel syndrome with constipation: Secondary | ICD-10-CM

## 2023-01-22 DIAGNOSIS — M797 Fibromyalgia: Secondary | ICD-10-CM

## 2023-01-22 DIAGNOSIS — K2100 Gastro-esophageal reflux disease with esophagitis, without bleeding: Secondary | ICD-10-CM

## 2023-01-22 DIAGNOSIS — R768 Other specified abnormal immunological findings in serum: Secondary | ICD-10-CM

## 2023-01-22 DIAGNOSIS — R5382 Chronic fatigue, unspecified: Secondary | ICD-10-CM

## 2023-01-22 DIAGNOSIS — Z9109 Other allergy status, other than to drugs and biological substances: Secondary | ICD-10-CM

## 2023-01-22 DIAGNOSIS — M7712 Lateral epicondylitis, left elbow: Secondary | ICD-10-CM

## 2023-01-22 DIAGNOSIS — M6289 Other specified disorders of muscle: Secondary | ICD-10-CM

## 2023-01-22 DIAGNOSIS — M79642 Pain in left hand: Secondary | ICD-10-CM

## 2023-01-22 DIAGNOSIS — M1712 Unilateral primary osteoarthritis, left knee: Secondary | ICD-10-CM

## 2023-01-22 DIAGNOSIS — Z91018 Allergy to other foods: Secondary | ICD-10-CM

## 2023-01-22 DIAGNOSIS — I5189 Other ill-defined heart diseases: Secondary | ICD-10-CM

## 2023-01-26 ENCOUNTER — Encounter: Payer: Self-pay | Admitting: Cardiology

## 2023-01-26 ENCOUNTER — Ambulatory Visit (INDEPENDENT_AMBULATORY_CARE_PROVIDER_SITE_OTHER): Payer: BC Managed Care – PPO | Admitting: Cardiology

## 2023-01-26 VITALS — BP 106/70 | HR 77 | Ht 67.0 in | Wt 174.0 lb

## 2023-01-26 DIAGNOSIS — I4892 Unspecified atrial flutter: Secondary | ICD-10-CM | POA: Diagnosis not present

## 2023-01-26 DIAGNOSIS — R002 Palpitations: Secondary | ICD-10-CM | POA: Diagnosis not present

## 2023-01-26 DIAGNOSIS — I5189 Other ill-defined heart diseases: Secondary | ICD-10-CM

## 2023-01-26 NOTE — Patient Instructions (Signed)
  Testing/Procedures:  Your physician has requested that you have an echocardiogram. Echocardiography is a painless test that uses sound waves to create images of your heart. It provides your doctor with information about the size and shape of your heart and how well your heart's chambers and valves are working. This procedure takes approximately one hour. There are no restrictions for this procedure. Please do NOT wear cologne, perfume, aftershave, or lotions (deodorant is allowed). Please arrive 15 minutes prior to your appointment time. [facility]   Follow-Up: At [facility], you and your health needs are our priority.  As part of our continuing mission to provide you with exceptional heart care, we have created designated Provider Care Teams.  These Care Teams include your primary Cardiologist (physician) and Advanced Practice Providers (APPs -  Physician Assistants and Nurse Practitioners) who all work together to provide you with the care you need, when you need it.  We recommend signing up for the patient portal called "MyChart".  Sign up information is provided on this After Visit Summary.  MyChart is used to connect with patients for Virtual Visits (Telemedicine).  Patients are able to view lab/test results, encounter notes, upcoming appointments, etc.  Non-urgent messages can be sent to your provider as well.   To learn more about what you can do with MyChart, go to [email or web address].    Your next appointment:   12 month(s)  Provider:   Kirk Ruths, MD

## 2023-01-26 NOTE — Progress Notes (Signed)
Office Visit Note  Patient: Emily Joseph             Date of Birth: 03-17-1971           MRN: [id number]             PCP: Ma Hillock, DO Referring: Ma Hillock, DO Visit Date: 02/09/2023 Occupation: @GUAROCC @  Subjective:  Episodic pain and fatigue  History of Present Illness: Emily Joseph is a 52 y.o. female with history of fibromyalgia, osteoarthritis and neurologist.  She states she recently went for a trip to [place] and did hiking.  She states her symptoms are better when she was in [place].  She could not tolerate Cymbalta as it caused increased mood changes.  She continues to have episodic increased generalized pain and discomfort.  She is taking some supplements which are helpful.  She complains of discomfort in her elbows, hands, hips and knees.  She also has discomfort in her bilateral trapezius region.  She is going to physical therapy which is not helpful.  She will be starting water therapy.   Activities of Daily Living:  Patient reports morning stiffness for 90 minutes.   Patient Reports nocturnal pain.  Difficulty dressing/grooming: Denies Difficulty climbing stairs: Reports Difficulty getting out of chair: Denies Difficulty using hands for taps, buttons, cutlery, and/or writing: Reports  Review of Systems  Constitutional:  Positive for fatigue.  HENT:  Negative for mouth sores and mouth dryness.   Eyes:  Positive for dryness.  Respiratory:  Positive for shortness of breath.   Cardiovascular:  Negative for chest pain and palpitations.  Gastrointestinal:  Positive for constipation. Negative for blood in stool and diarrhea.  Endocrine: Negative for increased urination.  Genitourinary:  Negative for involuntary urination.  Musculoskeletal:  Positive for joint pain, gait problem, joint pain, myalgias, morning stiffness, muscle tenderness and myalgias. Negative for joint swelling and muscle weakness.  Skin:  Negative for color change, rash, hair loss and  sensitivity to sunlight.  Allergic/Immunologic: Negative for susceptible to infections.  Neurological:  Positive for headaches. Negative for dizziness.  Hematological:  Positive for swollen glands.  Psychiatric/Behavioral:  Positive for depressed mood and sleep disturbance. The patient is not nervous/anxious.     PMFS History:  Patient Active Problem List   Diagnosis Date Noted   Pain in right hip 03/12/2022   Esophageal web    Female stress incontinence 09/07/2020   Heart disease 09/07/2020   Primary osteoarthritis of left knee 05/31/2020   At high risk for breast cancer 08/30/2019   Pelvic floor dysfunction 12/28/2018   Chronic RUQ pain - exacerbation 12/28/2018   Atrial flutter with rapid ventricular response 10/27/2016   s/p minimally invasive resection of right atrial lipoma    Chronic pain 06/08/2016   Chronic fatigue 06/08/2016   GERD (gastroesophageal reflux disease) with esophagitis 06/08/2016   Palpitations 06/08/2016   Anxiety 06/08/2016   Multiple environmental allergies 03/04/2016   Multiple food allergies 03/04/2016   Irritable bowel syndrome with constipation 01/27/2013   Fibromyalgia 01/07/2007    Past Medical History:  Diagnosis Date   Adenomyosis    not papanicolaou smear of cervix and cervical HPV   Allergy    Anxiety 06/08/2016   -about her health and about taking medications   Arthritis    ?   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 Trudie Reed in 2017, [facility] -seeing Dr. Jaynee Eagles, Neurologist for back pain with radicular symptoms   Complication of anesthesia    "with epidural bp bottoms out"   Dysrhythmia    fast hr   Eosinophilic esophagitis    Sees Dr. Carlean Purl   Fibromyalgia    GERD (gastroesophageal reflux disease)    H/O  laminectomy 09/07/2020   Heart murmur    History of atrial flutter 10/2016   spontaneous conversion to sinus   History of gallstones    History of laparoscopic-assisted vaginal hysterectomy 09/07/2020   History of pneumonia    when pt was pregnant   History of pneumothorax 07/2016   Right   History of sinus tachycardia    Iron deficiency anemia due to chronic blood loss    Menorrhagia, s/p complete hysterectomy, ovaries remain hx   Irritable bowel syndrome 01/27/2013   Dr Carlean Purl 02/2016    Microhematuria    Mouth problem    failed gum graft 1/16   Nasal airway abnormality    Nasal obstruction 06/26/2015   Seen by ENT 06-26-15.  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   Shortness of breath dyspnea    SUI (stress urinary incontinence, female)    Syncope    with last child with epideral   UTI (urinary tract infection) 07/14/2016   >=100,000 COLONIES/mL KLEBSIELLA PNEUMONIA   Vitamin D deficiency     Family History  Problem Relation Age of Onset   Cancer Mother    Breast cancer Mother 7   Lung cancer Mother    CAD Father        MI at age 89   Stroke Father    Cancer Father        Cholangiocarcinoma   Myasthenia gravis Sister    Chiari malformation Sister    Hematuria Sister    Goiter Maternal Grandmother    Cancer Maternal Grandfather    Lung cancer Maternal Grandfather    Rheum arthritis Paternal Grandmother    Stroke Paternal Grandfather    Kidney disease Paternal Grandfather    Alport syndrome Paternal Grandfather    Polycystic ovary syndrome Daughter        pre-diabetic    Ehlers-Danlos syndrome Daughter    Allergies Daughter    Healthy Daughter    Healthy Son    Esophageal cancer Paternal Uncle    Past Surgical History:  Procedure Laterality Date   APPENDECTOMY  01/1989   BALLOON  DILATION N/A 02/09/2013   Procedure: BALLOON DILATION;  Surgeon: Arta Silence, MD;  Location: [facility];  Service: Endoscopy;  Laterality: N/A;   BILATERAL SALPINGECTOMY N/A 06/14/2014   Procedure: BILATERAL SALPINGECTOMY;  Surgeon: Allena Katz, MD;  Location: [facility];  Service: Gynecology;  Laterality: N/A;   BIOPSY  09/04/2021   Procedure: BIOPSY;  Surgeon: Gatha Mayer, MD;  Location: [facility];  Service: Endoscopy;;   BREAST BIOPSY Right    biopsy    CHOLECYSTECTOMY N/A 11/21/2019   Procedure: LAPAROSCOPIC CHOLECYSTECTOMY WITH INTRAOPERATIVE CHOLANGIOGRAM;  Surgeon: Alphonsa Overall, MD;  Location: [facility];  Service: General;  Laterality: N/A;   COLONOSCOPY WITH  PROPOFOL N/A 02/09/2013   Procedure: COLONOSCOPY WITH PROPOFOL;  Surgeon: Arta Silence, MD;  Location: [facility];  Service: Endoscopy;  Laterality: N/A;  need ultra thin colon scope   ESOPHAGEAL DILATION  09/04/2021   Procedure: ESOPHAGEAL DILATION;  Surgeon: Gatha Mayer, MD;  Location: [facility];  Service: Endoscopy;;   ESOPHAGOGASTRODUODENOSCOPY Left 06/30/2021   Procedure: ESOPHAGOGASTRODUODENOSCOPY (EGD);  Surgeon: Lavena Bullion, DO;  Location[facility];  Service: Gastroenterology;  Laterality: Left;   ESOPHAGOGASTRODUODENOSCOPY (EGD) WITH PROPOFOL N/A 02/09/2013   Procedure: ESOPHAGOGASTRODUODENOSCOPY (EGD) WITH PROPOFOL;  Surgeon: Arta Silence, MD;  Location: [facility];  Service: Endoscopy;  Laterality: N/A;   ESOPHAGOGASTRODUODENOSCOPY (EGD) WITH PROPOFOL N/A 09/04/2021   Procedure: ESOPHAGOGASTRODUODENOSCOPY (EGD) WITH PROPOFOL;  Surgeon: Gatha Mayer, MD;  Location: [facility];  Service: Endoscopy;  Laterality: N/A;   FOREIGN BODY REMOVAL  06/30/2021   Procedure: FOREIGN BODY REMOVAL;  Surgeon: Lavena Bullion, DO;  Location: [facility];  Service: Gastroenterology;;   LAPAROSCOPIC ASSISTED VAGINAL HYSTERECTOMY Right 06/14/2014   Procedure: OPEN LAPAROSCOPIC ASSISTED VAGINAL HYSTERECTOMY;   Surgeon: Allena Katz, MD;  Location: [facility];  Service: Gynecology;  Laterality: Right;   LYSIS OF ADHESION N/A 06/14/2014   Procedure: LYSIS OF ADHESION;  Surgeon: Allena Katz, MD;  Location: [facility];  Service: Gynecology;  Laterality: N/A;   MINIMALLY INVASIVE EXCISION OF ATRIAL MYXOMA Right 07/11/2016   Procedure: MINIMALLY INVASIVE RESECTION OF RIGHT ATRIAL LIPOMA WITH CLOSURE OF PATENT FORAMEN OVALE;  Surgeon: Rexene Alberts, MD;  Location: [facility];  Service: Open Heart Surgery;  Laterality: Right;   OVARIAN CYST REMOVAL Right 1990   TEE WITHOUT CARDIOVERSION N/A 07/11/2016   Procedure: TRANSESOPHAGEAL ECHOCARDIOGRAM (TEE);  Surgeon: Rexene Alberts, MD;  Location: [facility];  Service: Open Heart Surgery;  Laterality: N/A;   Social History   Social History Narrative   Social History:   Now stays at home -  feels can barely function just to keep house and take care of  4 children. Husband travels and works a lot.   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      [name] - husband; [phone] -daughter; [name]- son [name] 2003 daughter [name] 2008 daughter    Healthy diet - lots of food allergies. Avoiding gluten currently.      Of note, at age 70 she had a very traumatic medical experience. She apparently was in the hospital for some time and had many needlesticks, many CT scans and surgery for an ovarian mass. This was very traumatizing for her. She still gets anxiety when she is going to see a doctor or health care provider. She had a flashback to this time when she went for acupuncture.        -Wears a bicycle helmet riding a bike: Yes     -smoke alarm in the home:Yes     - wears seatbelt: Yes     - Feels safe in their relationships: Yes   Immunization History  Administered Date(s) Administered   Influenza Whole 09/09/2007   Tdap 04/30/2011, 11/05/2021     Objective: Vital Signs: BP  108/73 (BP Location: Left Arm, Patient Position: Sitting, Cuff Size: Normal)   Pulse 81   Resp 16   Ht 5' 7.5" (1.715 m)   Wt 176 lb (79.8 kg)   LMP 05/20/2014 (Approximate)   BMI 27.16 kg/m  Physical Exam Vitals and nursing note reviewed.  Constitutional:      Appearance: She is well-developed.  HENT:     Head: Normocephalic and atraumatic.  Eyes:     Conjunctiva/sclera: Conjunctivae normal.  Cardiovascular:     Rate and Rhythm: Normal rate and regular rhythm.     Heart sounds: Normal heart sounds.  Pulmonary:     Effort: Pulmonary effort is normal.     Breath sounds: Normal breath sounds.  Abdominal:     General: Bowel sounds are normal.     Palpations: Abdomen is soft.  Musculoskeletal:     Cervical back: Normal range of motion.  Lymphadenopathy:     Cervical: No cervical adenopathy.  Skin:    General: Skin is warm and dry.     Capillary Refill: Capillary refill takes less than 2 seconds.  Neurological:     Mental Status: She is alert and oriented to person, place, and time.  Psychiatric:        Behavior: Behavior normal.      Musculoskeletal Exam: Cervical, thoracic and lumbar spine were in good range of motion.  She had tenderness over trapezius and SI joints.  Shoulders, elbows and wrist joints were in good range of motion.  There was no tenderness over MCPs PIPs and DIPs.  No synovitis was noted.  Hip joints and knee joints were in good range of motion.  There was no tenderness over ankles or MTPs.  CDAI Exam: CDAI Score: -- Patient Global: --; Provider Global: -- Swollen: --; Tender: -- Joint Exam 02/09/2023   No joint exam has been documented for this visit   There is currently no information documented on the homunculus. Go to the Rheumatology activity and complete the homunculus joint exam.  Investigation: No additional findings.  Imaging: No results found.  Recent Labs: Lab Results  Component Value Date   WBC 8.0 05/05/2022   HGB 13.2  05/05/2022   PLT 283.0 05/05/2022   NA 138 05/05/2022   K 3.7 05/05/2022   CL 102 05/05/2022   CO2 25 05/05/2022   GLUCOSE 108 (H) 05/05/2022   BUN 11 05/05/2022   CREATININE 0.75 05/05/2022   BILITOT 0.7 05/05/2022   ALKPHOS 75 05/05/2022   AST 19 05/05/2022   ALT 27 05/05/2022   PROT 7.0 05/05/2022   ALBUMIN 4.2 05/05/2022   CALCIUM 9.2 05/05/2022   GFRAA >60 11/16/2019    Speciality Comments: No specialty comments available.  Procedures:  No procedures performed Allergies: Avocado, Macadamia nut oil, Mango flavor, Other, Ciprofloxacin, Demerol [meperidine], Iodinated contrast media, Macrobid [nitrofurantoin], Betadine [povidone iodine], Latex, Dulcolax [bisacodyl], Sulfamethoxazole, and Tape   Assessment / Plan:     Visit Diagnoses: Fibromyalgia-she continues to have generalized pain and discomfort.  She has intermittent increased pain where she cannot exercise.  She is going to physical therapy and water therapy which has been helpful.  She complains of discomfort in the trapezius region and trochanteric region.  Stretching exercises were advised.  She tried Cymbalta but had to discontinue due to side effects.  Need for regular exercise and stretching was emphasized.  Chronic fatigue-most likely related to fibromyalgia and chronic insomnia.  Lateral epicondylitis of both elbows-she has intermittent discomfort.  She had no tenderness over the epicondyle region today.  Pain in both hands-she continues to have discomfort in her hands.  No synovitis was noted.  Joint protection muscle strengthening was discussed.  Primary osteoarthritis of left knee-she is intermittent pain.  No warmth swelling  or effusion was noted today.  Trochanteric bursitis of both hips-IT band stretches were discussed.  Positive ANA (antinuclear antibody) - AVISE negative index -1.1, ANA positive, negative titer, weak positive anti-histone.  She has no clinical features of autoimmune disease.  There is no  history of oral ulcers, nasal ulcers, malar rash, photosensitivity, Raynaud's or lymphadenopathy.  No synovitis was noted.  Patient was advised to contact us if she develops any new symptoms.  Irritable bowel syndrome with constipation-she continues to have chronic constipation.  She has been taking magnesium which has been helpful.Fiber rich diet was also discussed.  Other medical problems are listed as follows:  s/p minimally invasive resection of right atrial lipoma-she is followed by cardiologist.  Neuralgia  Pelvic floor dysfunction-she is going to school therapy.  Multiple environmental allergies  Multiple food allergies-she has multiple food allergies and has difficulty finding right diet.  Gastroesophageal reflux disease with esophagitis without hemorrhage  Anxiety  BMI 27.0-27.9,adult-she is having difficulty losing weight.  I will refer her to weight management clinic.  Orders: No orders of the defined types were placed in this encounter.  No orders of the defined types were placed in this encounter.    Follow-Up Instructions: Return in about 6 months (around 08/11/2023) for FMS, Osteoarthritis.   Bo Merino, MD  Note - This record has been created using Editor, commissioning.  Chart creation errors have been sought, but may not always  have been located. Such creation errors do not reflect on  the standard of medical care.

## 2023-01-28 ENCOUNTER — Ambulatory Visit: Payer: BC Managed Care – PPO

## 2023-01-28 DIAGNOSIS — M6281 Muscle weakness (generalized): Secondary | ICD-10-CM | POA: Diagnosis not present

## 2023-01-28 DIAGNOSIS — R279 Unspecified lack of coordination: Secondary | ICD-10-CM

## 2023-01-28 DIAGNOSIS — R293 Abnormal posture: Secondary | ICD-10-CM

## 2023-01-28 NOTE — Therapy (Signed)
OUTPATIENT PHYSICAL THERAPY FEMALE PELVIC TREATMENT   Patient Name: Emily Joseph MRN: [id number] DOB:Oct 15, 1971, 52 y.o., female Today's Date: 01/28/2023  END OF SESSION:  PT End of Session - 01/28/23 1059     Visit Number 8    Date for PT Re-Evaluation 04/01/23    Authorization Type [employer]    PT Start Time 1100    PT Stop Time 1140    PT Time Calculation (min) 40 min    Activity Tolerance Patient tolerated treatment well    Behavior During Therapy WFL for tasks assessed/performed                Past Medical History:  Diagnosis Date   Adenomyosis    not papanicolaou smear of cervix and cervical HPV   Allergy    Anxiety 06/08/2016   -about her health and about taking medications   Arthritis    ?   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 Trudie Reed in 2017, [facility] -seeing Dr. Jaynee Eagles, Neurologist for back pain with radicular symptoms   Complication of anesthesia    "with epidural bp bottoms out"   Dysrhythmia    fast hr   Eosinophilic esophagitis    Sees Dr. Carlean Purl   Fibromyalgia    GERD (gastroesophageal reflux disease)    H/O laminectomy 09/07/2020   Heart murmur    History of atrial flutter 10/2016   spontaneous conversion to sinus   History of gallstones    History of laparoscopic-assisted vaginal hysterectomy 09/07/2020   History of pneumonia    when pt was pregnant   History of pneumothorax 07/2016   Right   History of sinus tachycardia    Iron deficiency anemia due to chronic blood loss    Menorrhagia, s/p complete hysterectomy, ovaries remain hx   Irritable bowel syndrome 01/27/2013   Dr Carlean Purl 02/2016    Microhematuria    Mouth problem    failed gum graft 1/16   Nasal airway abnormality    Nasal obstruction 06/26/2015   Seen by  ENT 06-26-15.  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   Shortness of breath dyspnea    SUI (stress urinary incontinence, female)    Syncope    with last child with epideral   UTI (urinary tract infection) 07/14/2016   >=100,000 COLONIES/mL KLEBSIELLA PNEUMONIA   Vitamin D deficiency    Past Surgical History:  Procedure Laterality Date   APPENDECTOMY  01/1989   BALLOON DILATION N/A 02/09/2013   Procedure: BALLOON DILATION;  Surgeon: Arta Silence, MD;  Location: [facility];  Service: Endoscopy;  Laterality: N/A;   BILATERAL SALPINGECTOMY N/A 06/14/2014   Procedure: BILATERAL SALPINGECTOMY;  Surgeon: Allena Katz, MD;  Location: [facility];  Service: Gynecology;  Laterality: N/A;   BIOPSY  09/04/2021   Procedure: BIOPSY;  Surgeon: Gatha Mayer, MD;  Location: [facility];  Service: Endoscopy;;   BREAST BIOPSY Right    biopsy    CHOLECYSTECTOMY N/A 11/21/2019   Procedure: LAPAROSCOPIC CHOLECYSTECTOMY WITH INTRAOPERATIVE CHOLANGIOGRAM;  Surgeon: Alphonsa Overall, MD;  Location: [facility];  Service: General;  Laterality: N/A;   COLONOSCOPY WITH PROPOFOL N/A 02/09/2013   Procedure: COLONOSCOPY WITH PROPOFOL;  Surgeon: Arta Silence, MD;  Location: [facility];  Service: Endoscopy;  Laterality: N/A;  need ultra thin colon scope   ESOPHAGEAL DILATION  09/04/2021   Procedure: ESOPHAGEAL DILATION;  Surgeon: Gatha Mayer, MD;  Location: [facility];  Service: Endoscopy;;   ESOPHAGOGASTRODUODENOSCOPY Left 06/30/2021   Procedure: ESOPHAGOGASTRODUODENOSCOPY (EGD);  Surgeon: Lavena Bullion, DO;  Location: [facility];  Service: Gastroenterology;  Laterality: Left;   ESOPHAGOGASTRODUODENOSCOPY (EGD) WITH PROPOFOL N/A 02/09/2013   Procedure: ESOPHAGOGASTRODUODENOSCOPY (EGD) WITH PROPOFOL;  Surgeon: Arta Silence, MD;  Location:  [facility];  Service: Endoscopy;  Laterality: N/A;   ESOPHAGOGASTRODUODENOSCOPY (EGD) WITH PROPOFOL N/A 09/04/2021   Procedure: ESOPHAGOGASTRODUODENOSCOPY (EGD) WITH PROPOFOL;  Surgeon: Gatha Mayer, MD;  Location: [facility];  Service: Endoscopy;  Laterality: N/A;   FOREIGN BODY REMOVAL  06/30/2021   Procedure: FOREIGN BODY REMOVAL;  Surgeon: Lavena Bullion, DO;  Location: [facility];  Service: Gastroenterology;;   LAPAROSCOPIC ASSISTED VAGINAL HYSTERECTOMY Right 06/14/2014   Procedure: OPEN LAPAROSCOPIC ASSISTED VAGINAL HYSTERECTOMY;  Surgeon: Allena Katz, MD;  Location: [facility];  Service: Gynecology;  Laterality: Right;   LYSIS OF ADHESION N/A 06/14/2014   Procedure: LYSIS OF ADHESION;  Surgeon: Allena Katz, MD;  Location: [facility];  Service: Gynecology;  Laterality: N/A;   MINIMALLY INVASIVE EXCISION OF ATRIAL MYXOMA Right 07/11/2016   Procedure: MINIMALLY INVASIVE RESECTION OF RIGHT ATRIAL LIPOMA WITH CLOSURE OF PATENT FORAMEN OVALE;  Surgeon: Rexene Alberts, MD;  Location: [facility];  Service: Open Heart Surgery;  Laterality: Right;   OVARIAN CYST REMOVAL Right 1990   TEE WITHOUT CARDIOVERSION N/A 07/11/2016   Procedure: TRANSESOPHAGEAL ECHOCARDIOGRAM (TEE);  Surgeon: Rexene Alberts, MD;  Location: [facility];  Service: Open Heart Surgery;  Laterality: N/A;   Patient Active Problem List   Diagnosis Date Noted   Pain in right hip 03/12/2022   Esophageal web    Female stress incontinence 09/07/2020   Heart disease 09/07/2020   Primary osteoarthritis of left knee 05/31/2020   At high risk for breast cancer 08/30/2019   Pelvic floor dysfunction 12/28/2018   Chronic RUQ pain - exacerbation 12/28/2018   Atrial flutter with rapid ventricular response ([place]) 10/27/2016   s/p minimally invasive resection of right atrial lipoma    Chronic pain 06/08/2016   Chronic fatigue 06/08/2016   GERD (gastroesophageal reflux disease) with esophagitis 06/08/2016   Palpitations 06/08/2016    Anxiety 06/08/2016   Multiple environmental allergies 03/04/2016   Multiple food allergies 03/04/2016   Irritable bowel syndrome with constipation 01/27/2013   Fibromyalgia 01/07/2007    PCP: Ma Hillock, DO  REFERRING PROVIDER: Gatha Mayer, MD   REFERRING DIAG: K59.09 (ICD-10-CM) - Chronic constipation  Rationale for Evaluation and Treatment: Rehabilitation  THERAPY DIAG:  Muscle weakness (generalized)  Abnormal posture  Unspecified lack of coordination  ONSET DATE: chronic  SUBJECTIVE:  SUBJECTIVE STATEMENT: Pt states that that is feeling better today overall with improved energy. She was finally able to have bowel movement yesterday after not having complete bowel movements this last week. She is currently 4/10 pain level.   PERTINENT HISTORY:  Constipation, DRA, external hemorrhoid, rectocele, OA, fibromyalgia, prior Rt atrial lipoma and flutter, reports she has had Rt ovary cyst removed as a teenager, gallbladder removed 2021, LAVH 2014   Fluid intake: Yes: water - 80-100oz water per day, one cup coffee in morning.     PAIN:  Are you having pain? Yes NPRS scale: 5/10 (globally, all joints today),  Pain location:  bil hip pain is usually the worst pain. Does have current Rt lipoma on hip.   Pain type: aching and tight Pain description: constant   Aggravating factors: lifting laundry, twisting, stress, driving Relieving factors: squatting helps with stretching  PRECAUTIONS: None  WEIGHT BEARING RESTRICTIONS: No  FALLS:  Has patient fallen in last 6 months? 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   PLOF: Independent  PATIENT GOALS: to have less pain, more  regular bowels, strengthening DRA   Sexual abuse: No  BOWEL MOVEMENT: Pain with bowel movement: Yes - with hemorrhoids  Type of bowel movement:Type (Bristol Stool Scale) 2-6, Frequency daily  to every two days, and Strain Yes ( a little)  Fully empty rectum: No not all the time Leakage: No Pads: No Fiber supplement: Yes: uses miralax or softener if needed but not daily   URINATION: Pain with urination: No Fully empty bladder: No not always Stream: Strong and Weak Urgency: Yes: sometimes with increased water intake Frequency: not more than every 2 hours Leakage: Coughing and Sneezing only strongly, very small amount  Pads: No  INTERCOURSE: Pain with intercourse: During Penetration and sometimes makes right hip pain worse Ability to have vaginal penetration:  Yes:   Climax: able to climax without pain  Marinoff Scale: 2/3 Menopause has started and does have a lot dryness   PREGNANCY: Vaginal deliveries 4 Tearing No  but episiotomy with first C-section deliveries 0 Currently pregnant No  PROLAPSE: None   OBJECTIVE:  01/07/23: -DRA: 1 finger width separation with distortion -Sit-up test: 0/3 - unable and distortion present with effort -Transversus abdominus: able to activate with cuing - when coached with breathing and chin-up, improved distortion -Tenderbess in Rt upper quadrant with scar tissue restriction -MMT hip strength: 5/5 grossly    DIAGNOSTIC FINDINGS:    COGNITION: Overall cognitive status: Within functional limits for tasks assessed     SENSATION: Light touch: Deficits fingers and feet tingling Proprioception: Appears intact  MUSCLE LENGTH: Bil hamstrings and adductors limited blue bandy 25%     POSTURE: rounded shoulders, forward head, and posterior pelvic tilt   LUMBARAROM/PROM:  A/PROM A/PROM  eval  Flexion WFL  Extension WFL  Right lateral flexion Limited by 25%  Left lateral flexion Limited by 25%  Right rotation WFL  Left  rotation WFL   (Blank rows = not tested)  LOWER EXTREMITY ROM:  WFL  LOWER EXTREMITY MMT:  RT hips and adductors limited by 50% Lt hips and adductors limited by 25%  PALPATION:   General  TTP throughout Rt abdominal quadrants with noted fascial restrictions, Rt external obliques, Rt internal obliques, Rt psoas and with palpation in Rt abdomen pt reports she feels her Rt hip pain replicated  External Perineal Exam deferred until next visit                             Internal Pelvic Floor deferred until next visit  Patient confirms identification and approves PT to assess internal pelvic floor and treatment No  PELVIC MMT:   MMT eval  Vaginal   Internal Anal Sphincter   External Anal Sphincter   Puborectalis   Diastasis Recti Coning noted above umbilicus with active abdominal shoulder lift.    (Blank rows = not tested)        TONE: deferred until next visit  PROLAPSE: deferred until next visit  TODAY'S TREATMENT 01/28/23 Neuromuscular re-education: Seated chop 3lb 10x bil Seated chop with ball press 10x bil Seated march with UE ball press 2 x 10 Exercises: Seated hip IR 2 x 10 yellow loop, one leg at a time Seated hip abduction 2 x 10 yellow loop Seated hip adduction 2 x 10 ball squeeze  Seated thoracic openers 10x bil Seated lateral flexion 60 sec bil Seated forward fold 10 breaths  Therapeutic activities: Fiber education - different types of fiber, focusing on insoluble fiber when she is having difficulty with bowel movements Aquatic therapy intervention    TREATMENT 01/21/23 Neuromuscular re-education: Supine UE ball press 2 x 10 Sidelying UE ball press 2 x 10 bil Pelvic tilts 2 x 10 Exercises: Seated hip IR 2 x 10 yellow loop, one leg at a time Seated hip IR 2 x 10 yellow look bil Seated hip abduction 2 x 10 yellow loop Seated hip adduction 2 x 10 ball squeeze    TREATMENT 01/07/23 RE-EVALUATION Manual: Bowel mobilization Abdominal  scar tissue mobilization (Rt upper quadrant) Exercises: Open books 10x bil Bent knee fall out 10x bil Wide knee lower trunk rotation 3 x 10 Therapeutic activities: Squatty potty Self-bowel massage Water intake Tea    PATIENT EDUCATION:  Education details: [id number], abdominal massage, moisturizers Person educated: Patient Education method: Explanation, Demonstration, Tactile cues, Verbal cues, and Handouts Education comprehension: verbalized understanding and returned demonstration  HOME EXERCISE PROGRAM: [id number]  ASSESSMENT:  CLINICAL IMPRESSION: Pt feeling better today. However, she is still struggling with consistent bowel movements. Believe that constantly changing things and not being consistent with any dietary or lifestyle intervention is preventing faster progress. She performed all stretches and exercises well today with cues for improved core control. However, she reported increase to 7/10 pain. We discussed that due to overall increasing pain levels and poor  tolerance to even gentle mobility and strengthening, aquatic therapy may be a good option for a while. Pt agrees and would like to try. Pt would benefit from additional PT to further address deficits.    OBJECTIVE IMPAIRMENTS: decreased coordination, decreased endurance, decreased mobility, decreased strength, increased fascial restrictions, increased muscle spasms, impaired flexibility, improper body mechanics, postural dysfunction, and pain.   ACTIVITY LIMITATIONS: carrying, lifting, sitting, squatting, continence, and locomotion level  PARTICIPATION LIMITATIONS: interpersonal relationship and community activity  PERSONAL FACTORS: Fitness, Time since onset of injury/illness/exacerbation, and 1 comorbidity: medical history  are also affecting patient's functional outcome.   REHAB POTENTIAL: Good  CLINICAL DECISION MAKING: Stable/uncomplicated  EVALUATION COMPLEXITY: Low   GOALS: Goals reviewed with patient?  Yes  SHORT TERM GOALS: Target date: 10/27/22 - updated 11/25/22 - updated 01/07/23 with new target of 04/01/23  Pt to be I with HEP. Baseline: Goal status:MET 11/25/22  2.  Pt will report no  more than  4/10 pain due to improvements in posture, strength, and muscle length  Baseline:  Goal status: IN PROGRESS  3.  Pt to be I with voiding mechanics and breathing mechanics to decrease strain at pelvic floor and improve bowel and bladder habits.  Baseline:  Goal status:MET 11/25/22  4.  Pt to be I with abdominal massage for improved bowel habits.  Baseline: pt stopped performing - will start again Goal status: IN PROGRESS  LONG TERM GOALS: Target date: 12/30/21 updated 11/25/22 - updated 01/07/23 with new target of 04/01/23  Pt to be I with advanced HEP.  Baseline:  Goal status: IN PROGRESS  2.  Pt to demonstrate at least 5/5 bil hip strength for improved pelvic stability and functional squats without leakage.  Baseline:  Goal status: MET 01/07/23  3. Pt will report 2/10 pain due to improvements in posture, strength, and muscle length  Baseline:  Goal status:IN PROGRESS  4.  Pt will report type 3-4 on average for bowel movements based on stool chart for improved consistency and regularity.  Baseline:  Goal status: IN PROGRESS  5.  Pt to demonstrate minimal to no abdominal coning with core strengthening exercises for squatting for improved load transfer and decreased compensatory strategies.  Baseline:  Goal status: IN PROGRESS   PLAN:  PT FREQUENCY: 1x/week  PT DURATION:  8 sessions  PLANNED INTERVENTIONS: Therapeutic exercises, Therapeutic activity, Neuromuscular re-education, Patient/Family education, Self Care, Joint mobilization, Aquatic Therapy, Dry Needling, Spinal mobilization, Cryotherapy, Moist heat, scar mobilization, Taping, Biofeedback, and Manual therapy  PLAN FOR NEXT SESSION: Aquatic therapy intervention  Heather Roberts, PT, DPT03/20/2412:06 PM

## 2023-02-09 ENCOUNTER — Other Ambulatory Visit: Payer: Self-pay | Admitting: *Deleted

## 2023-02-09 ENCOUNTER — Ambulatory Visit: Payer: BC Managed Care – PPO | Attending: Rheumatology | Admitting: Rheumatology

## 2023-02-09 ENCOUNTER — Encounter: Payer: Self-pay | Admitting: Rheumatology

## 2023-02-09 VITALS — BP 108/73 | HR 81 | Resp 16 | Ht 67.5 in | Wt 176.0 lb

## 2023-02-09 DIAGNOSIS — R768 Other specified abnormal immunological findings in serum: Secondary | ICD-10-CM

## 2023-02-09 DIAGNOSIS — M6289 Other specified disorders of muscle: Secondary | ICD-10-CM

## 2023-02-09 DIAGNOSIS — I5189 Other ill-defined heart diseases: Secondary | ICD-10-CM

## 2023-02-09 DIAGNOSIS — M1712 Unilateral primary osteoarthritis, left knee: Secondary | ICD-10-CM

## 2023-02-09 DIAGNOSIS — Z9109 Other allergy status, other than to drugs and biological substances: Secondary | ICD-10-CM

## 2023-02-09 DIAGNOSIS — Z6827 Body mass index (BMI) 27.0-27.9, adult: Secondary | ICD-10-CM

## 2023-02-09 DIAGNOSIS — F419 Anxiety disorder, unspecified: Secondary | ICD-10-CM

## 2023-02-09 DIAGNOSIS — M7711 Lateral epicondylitis, right elbow: Secondary | ICD-10-CM | POA: Diagnosis not present

## 2023-02-09 DIAGNOSIS — M7061 Trochanteric bursitis, right hip: Secondary | ICD-10-CM

## 2023-02-09 DIAGNOSIS — K581 Irritable bowel syndrome with constipation: Secondary | ICD-10-CM

## 2023-02-09 DIAGNOSIS — R5382 Chronic fatigue, unspecified: Secondary | ICD-10-CM

## 2023-02-09 DIAGNOSIS — Z91018 Allergy to other foods: Secondary | ICD-10-CM

## 2023-02-09 DIAGNOSIS — M797 Fibromyalgia: Secondary | ICD-10-CM | POA: Diagnosis not present

## 2023-02-09 DIAGNOSIS — M79641 Pain in right hand: Secondary | ICD-10-CM

## 2023-02-09 DIAGNOSIS — K2100 Gastro-esophageal reflux disease with esophagitis, without bleeding: Secondary | ICD-10-CM

## 2023-02-09 DIAGNOSIS — M792 Neuralgia and neuritis, unspecified: Secondary | ICD-10-CM

## 2023-02-09 DIAGNOSIS — M7062 Trochanteric bursitis, left hip: Secondary | ICD-10-CM

## 2023-02-09 DIAGNOSIS — M79642 Pain in left hand: Secondary | ICD-10-CM

## 2023-02-09 DIAGNOSIS — R7689 Other specified abnormal immunological findings in serum: Secondary | ICD-10-CM

## 2023-02-09 DIAGNOSIS — M7712 Lateral epicondylitis, left elbow: Secondary | ICD-10-CM

## 2023-02-12 ENCOUNTER — Ambulatory Visit (HOSPITAL_BASED_OUTPATIENT_CLINIC_OR_DEPARTMENT_OTHER)
Admission: RE | Admit: 2023-02-12 | Discharge: 2023-02-12 | Disposition: A | Discharge status: Home / Self Care | Payer: BC Managed Care – PPO | Source: Ambulatory Visit | Attending: Cardiology | Admitting: Cardiology

## 2023-02-12 DIAGNOSIS — I5189 Other ill-defined heart diseases: Secondary | ICD-10-CM | POA: Insufficient documentation

## 2023-02-12 LAB — ECHOCARDIOGRAM COMPLETE
Area-P 1/2: 1.96 cm2
S' Lateral: 3 cm

## 2023-02-14 ENCOUNTER — Encounter: Payer: Self-pay | Admitting: Internal Medicine

## 2023-02-19 ENCOUNTER — Ambulatory Visit (HOSPITAL_BASED_OUTPATIENT_CLINIC_OR_DEPARTMENT_OTHER): Payer: BC Managed Care – PPO | Admitting: Physical Therapy

## 2023-02-26 ENCOUNTER — Encounter (HOSPITAL_BASED_OUTPATIENT_CLINIC_OR_DEPARTMENT_OTHER): Payer: Self-pay | Admitting: Physical Therapy

## 2023-02-26 ENCOUNTER — Ambulatory Visit (HOSPITAL_BASED_OUTPATIENT_CLINIC_OR_DEPARTMENT_OTHER): Payer: BC Managed Care – PPO | Attending: Internal Medicine | Admitting: Physical Therapy

## 2023-02-26 DIAGNOSIS — R279 Unspecified lack of coordination: Secondary | ICD-10-CM

## 2023-02-26 DIAGNOSIS — R293 Abnormal posture: Secondary | ICD-10-CM | POA: Diagnosis present

## 2023-02-26 DIAGNOSIS — M6281 Muscle weakness (generalized): Secondary | ICD-10-CM

## 2023-02-26 NOTE — Therapy (Signed)
OUTPATIENT PHYSICAL THERAPY FEMALE PELVIC TREATMENT   Patient Name: Emily Joseph MRN: [id number] DOB:Apr 01, 1971, 52 y.o., female Today's Date: 02/26/2023  END OF SESSION:  PT End of Session - 02/26/23 1029     Visit Number 9    Date for PT Re-Evaluation 04/01/23    Authorization Type [employer]    PT Start Time 1030    PT Stop Time 1110    PT Time Calculation (min) 40 min    Behavior During Therapy [facility] for tasks assessed/performed                Past Medical History:  Diagnosis Date   Adenomyosis    not papanicolaou smear of cervix and cervical HPV   Allergy    Anxiety 06/08/2016   -about her health and about taking medications   Arthritis    ?   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 Nickola Major in 2017, [place] rheum -seeing Dr. Lucia Gaskins, Neurologist for back pain with radicular symptoms   Complication of anesthesia    "with epidural bp bottoms out"   Dysrhythmia    fast hr   Eosinophilic esophagitis    Sees Dr. Leone Payor   Fibromyalgia    GERD (gastroesophageal reflux disease)    H/O laminectomy 09/07/2020   Heart murmur    History of atrial flutter 10/2016   spontaneous conversion to sinus   History of gallstones    History of laparoscopic-assisted vaginal hysterectomy 09/07/2020   History of pneumonia    when pt was pregnant   History of pneumothorax 07/2016   Right   History of sinus tachycardia    Iron deficiency anemia due to chronic blood loss    Menorrhagia, s/p complete hysterectomy, ovaries remain hx   Irritable bowel syndrome 01/27/2013   Dr Leone Payor 02/2016    Microhematuria    Mouth problem    failed gum graft 1/16   Nasal airway abnormality    Nasal obstruction 06/26/2015   Seen by ENT 06-26-15.  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   Shortness of breath dyspnea    SUI (stress urinary incontinence, female)    Syncope    with last child with epideral   UTI (urinary tract infection) 07/14/2016   >=100,000 COLONIES/mL KLEBSIELLA PNEUMONIA   Vitamin D deficiency    Past Surgical History:  Procedure Laterality Date   APPENDECTOMY  01/1989   BALLOON DILATION N/A 02/09/2013   Procedure: BALLOON DILATION;  Surgeon: Willis Modena, MD;  Location: [facility];  Service: Endoscopy;  Laterality: N/A;   BILATERAL SALPINGECTOMY N/A 06/14/2014   Procedure: BILATERAL SALPINGECTOMY;  Surgeon: Leslie Andrea, MD;  Location: [facility];  Service: Gynecology;  Laterality: N/A;   BIOPSY  09/04/2021   Procedure: BIOPSY;  Surgeon: Iva Boop, MD;  Location: [facility];  Service: Endoscopy;;   BREAST BIOPSY Right    biopsy    CHOLECYSTECTOMY N/A 11/21/2019   Procedure: LAPAROSCOPIC CHOLECYSTECTOMY WITH INTRAOPERATIVE CHOLANGIOGRAM;  Surgeon: Ovidio Kin, MD;  Location: [facility];  Service: General;  Laterality: N/A;   COLONOSCOPY WITH PROPOFOL N/A 02/09/2013   Procedure: COLONOSCOPY WITH PROPOFOL;  Surgeon: Willis Modena, MD;  Location: [facility];  Service: Endoscopy;  Laterality: N/A;  need ultra thin colon scope   ESOPHAGEAL DILATION  09/04/2021   Procedure: ESOPHAGEAL DILATION;  Surgeon: Iva Boop, MD;  Location: [facility];  Service: Endoscopy;;   ESOPHAGOGASTRODUODENOSCOPY Left 06/30/2021   Procedure: ESOPHAGOGASTRODUODENOSCOPY (EGD);  Surgeon: Shellia Cleverly, DO;  Location[facility] ENDOSCOPY;  Service: Gastroenterology;  Laterality: Left;   ESOPHAGOGASTRODUODENOSCOPY (EGD) WITH PROPOFOL N/A 02/09/2013   Procedure: ESOPHAGOGASTRODUODENOSCOPY (EGD) WITH PROPOFOL;  Surgeon: Willis Modena, MD;  Location: [facility];  Service: Endoscopy;  Laterality: N/A;    ESOPHAGOGASTRODUODENOSCOPY (EGD) WITH PROPOFOL N/A 09/04/2021   Procedure: ESOPHAGOGASTRODUODENOSCOPY (EGD) WITH PROPOFOL;  Surgeon: Iva Boop, MD;  Location: [facility];  Service: Endoscopy;  Laterality: N/A;   FOREIGN BODY REMOVAL  06/30/2021   Procedure: FOREIGN BODY REMOVAL;  Surgeon: Shellia Cleverly, DO;  Location: [facility];  Service: Gastroenterology;;   LAPAROSCOPIC ASSISTED VAGINAL HYSTERECTOMY Right 06/14/2014   Procedure: OPEN LAPAROSCOPIC ASSISTED VAGINAL HYSTERECTOMY;  Surgeon: Leslie Andrea, MD;  Location: [facility];  Service: Gynecology;  Laterality: Right;   LYSIS OF ADHESION N/A 06/14/2014   Procedure: LYSIS OF ADHESION;  Surgeon: Leslie Andrea, MD;  Location: [facility];  Service: Gynecology;  Laterality: N/A;   MINIMALLY INVASIVE EXCISION OF ATRIAL MYXOMA Right 07/11/2016   Procedure: MINIMALLY INVASIVE RESECTION OF RIGHT ATRIAL LIPOMA WITH CLOSURE OF PATENT FORAMEN OVALE;  Surgeon: Purcell Nails, MD;  Location: [facility] OR;  Service: Open Heart Surgery;  Laterality: Right;   OVARIAN CYST REMOVAL Right 1990   TEE WITHOUT CARDIOVERSION N/A 07/11/2016   Procedure: TRANSESOPHAGEAL ECHOCARDIOGRAM (TEE);  Surgeon: Purcell Nails, MD;  Location: [facility] OR;  Service: Open Heart Surgery;  Laterality: N/A;   Patient Active Problem List   Diagnosis Date Noted   Pain in right hip 03/12/2022   Esophageal web    Female stress incontinence 09/07/2020   Heart disease 09/07/2020   Primary osteoarthritis of left knee 05/31/2020   At high risk for breast cancer 08/30/2019   Pelvic floor dysfunction 12/28/2018   Chronic RUQ pain - exacerbation 12/28/2018   Atrial flutter with rapid ventricular response 10/27/2016   s/p minimally invasive resection of right atrial lipoma    Chronic pain 06/08/2016   Chronic fatigue 06/08/2016   GERD (gastroesophageal reflux disease) with esophagitis 06/08/2016   Palpitations 06/08/2016   Anxiety 06/08/2016   Multiple environmental allergies 03/04/2016    Multiple food allergies 03/04/2016   Irritable bowel syndrome with constipation 01/27/2013   Fibromyalgia 01/07/2007    PCP: Natalia Leatherwood, DO  REFERRING PROVIDER: Iva Boop, MD   REFERRING DIAG: K59.09 (ICD-10-CM) - Chronic constipation  Rationale for Evaluation and Treatment: Rehabilitation  THERAPY DIAG:  Muscle weakness (generalized)  Abnormal posture  Unspecified lack of coordination  ONSET DATE: chronic  SUBJECTIVE:  SUBJECTIVE STATEMENT: Pt states "I'm doing a little better.  I'm taking a B complex.  It's helping me not hurt as bad."  She reports she would like to tone up and plans to exercise at [facility] pool this summer.   PERTINENT HISTORY:  Constipation, DRA, external hemorrhoid, rectocele, OA, fibromyalgia, prior Rt atrial lipoma and flutter, reports she has had Rt ovary cyst removed as a teenager, gallbladder removed 2021, LAVH 2014   Fluid intake: Yes: water - 80-100oz water per day, one cup coffee in morning.     PAIN:  Are you having pain? yes NPRS scale: 4- 5/10 (globally, all joints today),  Pain location:  bil hip pain is usually the worst pain. Does have current Rt lipoma on hip.   Pain type: aching and tight Pain description: constant   Aggravating factors: lifting laundry, twisting, stress, driving Relieving factors: squatting helps with stretching  PRECAUTIONS: None  WEIGHT BEARING RESTRICTIONS: No  FALLS:  Has patient fallen in last 6 months? 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   PLOF: Independent  PATIENT GOALS: to have less pain, more regular bowels, strengthening DRA   Sexual abuse: No  BOWEL MOVEMENT: Pain with bowel  movement: Yes - with hemorrhoids  Type of bowel movement:Type (Bristol Stool Scale) 2-6, Frequency daily  to every two days, and Strain Yes ( a little)  Fully empty rectum: No not all the time Leakage: No Pads: No Fiber supplement: Yes: uses miralax or softener if needed but not daily   URINATION: Pain with urination: No Fully empty bladder: No not always Stream: Strong and Weak Urgency: Yes: sometimes with increased water intake Frequency: not more than every 2 hours Leakage: Coughing and Sneezing only strongly, very small amount  Pads: No  INTERCOURSE: Pain with intercourse: During Penetration and sometimes makes right hip pain worse Ability to have vaginal penetration:  Yes:   Climax: able to climax without pain  Marinoff Scale: 2/3 Menopause has started and does have a lot dryness   PREGNANCY: Vaginal deliveries 4 Tearing No  but episiotomy with first C-section deliveries 0 Currently pregnant No  PROLAPSE: None   OBJECTIVE:  01/07/23: -DRA: 1 finger width separation with distortion -Sit-up test: 0/3 - unable and distortion present with effort -Transversus abdominus: able to activate with cuing - when coached with breathing and chin-up, improved distortion -Tenderbess in Rt upper quadrant with scar tissue restriction -MMT hip strength: 5/5 grossly    DIAGNOSTIC FINDINGS:    COGNITION: Overall cognitive status: Within functional limits for tasks assessed     SENSATION: Light touch: Deficits fingers and feet tingling Proprioception: Appears intact  MUSCLE LENGTH: Bil hamstrings and adductors limited blue bandy 25%     POSTURE: rounded shoulders, forward head, and posterior pelvic tilt   LUMBARAROM/PROM:  A/PROM A/PROM  eval  Flexion WFL  Extension WFL  Right lateral flexion Limited by 25%  Left lateral flexion Limited by 25%  Right rotation WFL  Left rotation WFL   (Blank rows = not tested)  LOWER EXTREMITY ROM:  WFL  LOWER EXTREMITY  MMT:  RT hips and adductors limited by 50% Lt hips and adductors limited by 25%  PALPATION:   General  TTP throughout Rt abdominal quadrants with noted fascial restrictions, Rt external obliques, Rt internal obliques, Rt psoas and with palpation in Rt abdomen pt reports she feels her Rt hip pain replicated  External Perineal Exam deferred until next visit                             Internal Pelvic Floor deferred until next visit  Patient confirms identification and approves PT to assess internal pelvic floor and treatment No  PELVIC MMT:   MMT eval  Vaginal   Internal Anal Sphincter   External Anal Sphincter   Puborectalis   Diastasis Recti Coning noted above umbilicus with active abdominal shoulder lift.    (Blank rows = not tested)        TONE: deferred until next visit  PROLAPSE: deferred until next visit  TODAY'S TREATMENT 02/26/23 Pt seen for aquatic therapy today.  Treatment took place in water 3.5-4.75 ft in depth at the [facility] pool. Temp of water was 91.  Pt entered/exited the pool via stairs independently with bilat rail.  (Pt faces towards hot tub due to L shorter LE) * without support:  walking forward/ backward / side stepping with arm addct * holding wall: hip abdct/ addct x 10; single leg clams x 10 each;   straight LE circles CW/ CCW;  * return to walking backward / forward with reciprocal arm swing * TrA set with short blue noodle pull down - > solid noodle * staggered stance with bilat shoulder horiz abdct/ addct;  with tricep push downs yellow -> rainbow hand floats * yellow noodle under arms in corner:  knee tucks, then knee tucks L and R  Pt requires the buoyancy and hydrostatic pressure of water for support, and to offload joints by unweighting joint load by at least 50 % in navel deep water and by at least 75-80% in chest to neck deep water.  Viscosity of the water is needed for resistance of strengthening. Water current  perturbations provides challenge to standing balance requiring increased core activation.   TODAY'S TREATMENT 01/28/23 Neuromuscular re-education: Seated chop 3lb 10x bil Seated chop with ball press 10x bil Seated march with UE ball press 2 x 10 Exercises: Seated hip IR 2 x 10 yellow loop, one leg at a time Seated hip abduction 2 x 10 yellow loop Seated hip adduction 2 x 10 ball squeeze  Seated thoracic openers 10x bil Seated lateral flexion 60 sec bil Seated forward fold 10 breaths  Therapeutic activities: Fiber education - different types of fiber, focusing on insoluble fiber when she is having difficulty with bowel movements Aquatic therapy intervention    TREATMENT 01/21/23 Neuromuscular re-education: Supine UE ball press 2 x 10 Sidelying UE ball press 2 x 10 bil Pelvic tilts 2 x 10 Exercises: Seated hip IR 2 x 10 yellow loop, one leg at a time Seated hip IR 2 x 10 yellow look bil Seated hip abduction 2 x 10 yellow loop Seated hip adduction 2 x 10 ball squeeze    TREATMENT 01/07/23 RE-EVALUATION Manual: Bowel mobilization Abdominal scar tissue mobilization (Rt upper quadrant) Exercises: Open books 10x bil Bent knee fall out 10x bil Wide knee lower trunk rotation 3 x 10 Therapeutic activities: Squatty potty Self-bowel massage Water intake Tea    PATIENT EDUCATION:  Education details:  aquatic therapy intro Person educated: Patient Education method: Programmer, multimedia, Facilities manager, Actor cues, Verbal cues, and Handouts Education comprehension: verbalized understanding and returned demonstration  HOME EXERCISE PROGRAM: [id number]  ASSESSMENT:  CLINICAL IMPRESSION: Pt is confident in aquatic setting and able to take direction from therapist on land.  She is instructed in  gentle ROM and strengthening exercises; tolerated all well, without increase in pain.  Pt would benefit from additional PT to further address deficits.  Goals are ongoing.   OBJECTIVE  IMPAIRMENTS: decreased coordination, decreased endurance, decreased mobility, decreased strength, increased fascial restrictions, increased muscle spasms, impaired flexibility, improper body mechanics, postural dysfunction, and pain.   ACTIVITY LIMITATIONS: carrying, lifting, sitting, squatting, continence, and locomotion level  PARTICIPATION LIMITATIONS: interpersonal relationship and community activity  PERSONAL FACTORS: Fitness, Time since onset of injury/illness/exacerbation, and 1 comorbidity: medical history  are also affecting patient's functional outcome.   REHAB POTENTIAL: Good  CLINICAL DECISION MAKING: Stable/uncomplicated  EVALUATION COMPLEXITY: Low   GOALS: Goals reviewed with patient? Yes  SHORT TERM GOALS: Target date: 10/27/22 - updated 11/25/22 - updated 01/07/23 with new target of 04/01/23  Pt to be I with HEP. Baseline: Goal status:MET 11/25/22  2.  Pt will report no  more than 4/10 pain due to improvements in posture, strength, and muscle length  Baseline:  Goal status: IN PROGRESS  3.  Pt to be I with voiding mechanics and breathing mechanics to decrease strain at pelvic floor and improve bowel and bladder habits.  Baseline:  Goal status:MET 11/25/22  4.  Pt to be I with abdominal massage for improved bowel habits.  Baseline: pt stopped performing - will start again Goal status: IN PROGRESS  LONG TERM GOALS: Target date: 12/30/21 updated 11/25/22 - updated 01/07/23 with new target of 04/01/23  Pt to be I with advanced HEP.  Baseline:  Goal status: IN PROGRESS  2.  Pt to demonstrate at least 5/5 bil hip strength for improved pelvic stability and functional squats without leakage.  Baseline:  Goal status: MET 01/07/23  3. Pt will report 2/10 pain due to improvements in posture, strength, and muscle length  Baseline:  Goal status:IN PROGRESS  4.  Pt will report type 3-4 on average for bowel movements based on stool chart for improved consistency and  regularity.  Baseline:  Goal status: IN PROGRESS  5.  Pt to demonstrate minimal to no abdominal coning with core strengthening exercises for squatting for improved load transfer and decreased compensatory strategies.  Baseline:  Goal status: IN PROGRESS   PLAN:  PT FREQUENCY: 1x/week  PT DURATION:  8 sessions  PLANNED INTERVENTIONS: Therapeutic exercises, Therapeutic activity, Neuromuscular re-education, Patient/Family education, Self Care, Joint mobilization, Aquatic Therapy, Dry Needling, Spinal mobilization, Cryotherapy, Moist heat, scar mobilization, Taping, Biofeedback, and Manual therapy  PLAN FOR NEXT SESSION: Aquatic therapy intervention  Mayer Camel, PTA 02/26/23 11:13 AM [facility] [address] [place], [place], [address] Phone: [phone]   Fax:  [phone]

## 2023-03-05 ENCOUNTER — Ambulatory Visit (HOSPITAL_BASED_OUTPATIENT_CLINIC_OR_DEPARTMENT_OTHER): Payer: BC Managed Care – PPO | Admitting: Physical Therapy

## 2023-03-12 ENCOUNTER — Ambulatory Visit (HOSPITAL_BASED_OUTPATIENT_CLINIC_OR_DEPARTMENT_OTHER): Payer: BC Managed Care – PPO | Attending: Internal Medicine | Admitting: Physical Therapy

## 2023-03-12 ENCOUNTER — Encounter (HOSPITAL_BASED_OUTPATIENT_CLINIC_OR_DEPARTMENT_OTHER): Payer: Self-pay | Admitting: Physical Therapy

## 2023-03-12 DIAGNOSIS — M6281 Muscle weakness (generalized): Secondary | ICD-10-CM

## 2023-03-12 DIAGNOSIS — R279 Unspecified lack of coordination: Secondary | ICD-10-CM | POA: Diagnosis present

## 2023-03-12 DIAGNOSIS — R293 Abnormal posture: Secondary | ICD-10-CM

## 2023-03-12 NOTE — Therapy (Signed)
OUTPATIENT PHYSICAL THERAPY FEMALE PELVIC TREATMENT   Patient Name: Emily Joseph MRN: [id number] DOB:[birth year], 52 y.o., female Today's Date: 03/12/2023  END OF SESSION:  PT End of Session - 03/12/23 1039     Visit Number 10    Date for PT Re-Evaluation 04/01/23    Authorization Type [employer]    PT Start Time 1030    PT Stop Time 1110    PT Time Calculation (min) 40 min                Past Medical History:  Diagnosis Date   Adenomyosis    not papanicolaou smear of cervix and cervical HPV   Allergy    Anxiety 06/08/2016   -about her health and about taking medications   Arthritis    ?   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 Nickola Major in 2017, [place] [facility] -seeing Dr. Lucia Gaskins, Neurologist for back pain with radicular symptoms   Complication of anesthesia    "with epidural bp bottoms out"   Dysrhythmia    fast hr   Eosinophilic esophagitis    Sees Dr. Leone Payor   Fibromyalgia    GERD (gastroesophageal reflux disease)    H/O laminectomy 09/07/2020   Heart murmur    History of atrial flutter 10/2016   spontaneous conversion to sinus   History of gallstones    History of laparoscopic-assisted vaginal hysterectomy 09/07/2020   History of pneumonia    when pt was pregnant   History of pneumothorax 07/2016   Right   History of sinus tachycardia    Iron deficiency anemia due to chronic blood loss    Menorrhagia, s/p complete hysterectomy, ovaries remain hx   Irritable bowel syndrome 01/27/2013   Dr Leone Payor 02/2016    Microhematuria    Mouth problem    failed gum graft 1/16   Nasal airway abnormality    Nasal obstruction 06/26/2015   Seen by ENT 06-26-15.  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 c/w benign cardiac lipoma   Shortness of breath dyspnea    SUI (stress urinary incontinence, female)    Syncope    with last child with epideral   UTI (urinary tract infection) 07/14/2016   >=100,000 COLONIES/mL KLEBSIELLA PNEUMONIA   Vitamin D deficiency    Past Surgical History:  Procedure Laterality Date   APPENDECTOMY  01/1989   BALLOON DILATION N/A 02/09/2013   Procedure: BALLOON DILATION;  Surgeon: Willis Modena, MD;  Location: [facility];  Service: Endoscopy;  Laterality: N/A;   BILATERAL SALPINGECTOMY N/A 06/14/2014   Procedure: BILATERAL SALPINGECTOMY;  Surgeon: Leslie Andrea, MD;  Location: [facility];  Service: Gynecology;  Laterality: N/A;   BIOPSY  09/04/2021   Procedure: BIOPSY;  Surgeon: Iva Boop, MD;  Location: [facility];  Service: Endoscopy;;   BREAST BIOPSY Right    biopsy    CHOLECYSTECTOMY N/A 11/21/2019   Procedure: LAPAROSCOPIC CHOLECYSTECTOMY WITH INTRAOPERATIVE CHOLANGIOGRAM;  Surgeon: Ovidio Kin, MD;  Location: [facility];  Service: General;  Laterality: N/A;   COLONOSCOPY WITH  PROPOFOL N/A 02/09/2013   Procedure: COLONOSCOPY WITH PROPOFOL;  Surgeon: Willis Modena, MD;  Location: [facility];  Service: Endoscopy;  Laterality: N/A;  need ultra thin colon scope   ESOPHAGEAL DILATION  09/04/2021   Procedure: ESOPHAGEAL DILATION;  Surgeon: Iva Boop, MD;  Location: [facility];  Service: Endoscopy;;   ESOPHAGOGASTRODUODENOSCOPY Left 06/30/2021   Procedure: ESOPHAGOGASTRODUODENOSCOPY (EGD);  Surgeon: Shellia Cleverly, DO;  [facility];  Service: Gastroenterology;  Laterality: Left;   ESOPHAGOGASTRODUODENOSCOPY (EGD) WITH PROPOFOL N/A 02/09/2013   Procedure: ESOPHAGOGASTRODUODENOSCOPY (EGD) WITH PROPOFOL;  Surgeon: Willis Modena, MD;  Location: [facility];  Service: Endoscopy;  Laterality: N/A;   ESOPHAGOGASTRODUODENOSCOPY (EGD) WITH PROPOFOL N/A 09/04/2021    Procedure: ESOPHAGOGASTRODUODENOSCOPY (EGD) WITH PROPOFOL;  Surgeon: Iva Boop, MD;  Location: [facility];  Service: Endoscopy;  Laterality: N/A;   FOREIGN BODY REMOVAL  06/30/2021   Procedure: FOREIGN BODY REMOVAL;  Surgeon: Shellia Cleverly, DO;  Location: [facility];  Service: Gastroenterology;;   LAPAROSCOPIC ASSISTED VAGINAL HYSTERECTOMY Right 06/14/2014   Procedure: OPEN LAPAROSCOPIC ASSISTED VAGINAL HYSTERECTOMY;  Surgeon: Leslie Andrea, MD;  Location: [facility];  Service: Gynecology;  Laterality: Right;   LYSIS OF ADHESION N/A 06/14/2014   Procedure: LYSIS OF ADHESION;  Surgeon: Leslie Andrea, MD;  Location: [facility];  Service: Gynecology;  Laterality: N/A;   MINIMALLY INVASIVE EXCISION OF ATRIAL MYXOMA Right 07/11/2016   Procedure: MINIMALLY INVASIVE RESECTION OF RIGHT ATRIAL LIPOMA WITH CLOSURE OF PATENT FORAMEN OVALE;  Surgeon: Purcell Nails, MD;  Location: [facility];  Service: Open Heart Surgery;  Laterality: Right;   OVARIAN CYST REMOVAL Right 1990   TEE WITHOUT CARDIOVERSION N/A 07/11/2016   Procedure: TRANSESOPHAGEAL ECHOCARDIOGRAM (TEE);  Surgeon: Purcell Nails, MD;  Location[facility];  Service: Open Heart Surgery;  Laterality: N/A;   Patient Active Problem List   Diagnosis Date Noted   Pain in right hip 03/12/2022   Esophageal web    Female stress incontinence 09/07/2020   Heart disease 09/07/2020   Primary osteoarthritis of left knee 05/31/2020   At high risk for breast cancer 08/30/2019   Pelvic floor dysfunction 12/28/2018   Chronic RUQ pain - exacerbation 12/28/2018   Atrial flutter with rapid ventricular response (HCC) 10/27/2016   s/p minimally invasive resection of right atrial lipoma    Chronic pain 06/08/2016   Chronic fatigue 06/08/2016   GERD (gastroesophageal reflux disease) with esophagitis 06/08/2016   Palpitations 06/08/2016   Anxiety 06/08/2016   Multiple environmental allergies 03/04/2016   Multiple food allergies 03/04/2016   Irritable bowel  syndrome with constipation 01/27/2013   Fibromyalgia 01/07/2007    PCP: Natalia Leatherwood, DO  REFERRING PROVIDER: Iva Boop, MD   REFERRING DIAG: K59.09 (ICD-10-CM) - Chronic constipation  Rationale for Evaluation and Treatment: Rehabilitation  THERAPY DIAG:  Muscle weakness (generalized)  Abnormal posture  Unspecified lack of coordination  ONSET DATE: chronic  SUBJECTIVE:  SUBJECTIVE STATEMENT:  Pt reports she did well after last session.  She was at beach and was in pool 2x doing walking; reports good tolerance to this.  She reports she has been doing a lot of driving over the last 2 weeks, which has made her neck and UE more painful.      PERTINENT HISTORY:  Constipation, DRA, external hemorrhoid, rectocele, OA, fibromyalgia, prior Rt atrial lipoma and flutter, reports she has had Rt ovary cyst removed as a teenager, gallbladder removed 2021, LAVH 2014   Fluid intake: Yes: water - 80-100oz water per day, one cup coffee in morning.     PAIN:  Are you having pain? yes NPRS scale: 4/10  generalized with focus to- hands/ elbows/ neck ;  Pain location:  bil hip pain is usually the worst pain. Does have current Rt lipoma on hip.   Pain type: aching and tight Pain description: constant   Aggravating factors: lifting laundry, twisting, stress, driving Relieving factors: squatting helps with stretching  PRECAUTIONS: None  WEIGHT BEARING RESTRICTIONS: No  FALLS:  Has patient fallen in last 6 months? 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   PLOF: Independent  PATIENT GOALS: to have less pain, more regular bowels, strengthening DRA   Sexual abuse: No  BOWEL  MOVEMENT: Pain with bowel movement: Yes - with hemorrhoids  Type of bowel movement:Type (Bristol Stool Scale) 2-6, Frequency daily  to every two days, and Strain Yes ( a little)  Fully empty rectum: No not all the time Leakage: No Pads: No Fiber supplement: Yes: uses miralax or softener if needed but not daily   URINATION: Pain with urination: No Fully empty bladder: No not always Stream: Strong and Weak Urgency: Yes: sometimes with increased water intake Frequency: not more than every 2 hours Leakage: Coughing and Sneezing only strongly, very small amount  Pads: No  INTERCOURSE: Pain with intercourse: During Penetration and sometimes makes right hip pain worse Ability to have vaginal penetration:  Yes:   Climax: able to climax without pain  Marinoff Scale: 2/3 Menopause has started and does have a lot dryness   PREGNANCY: Vaginal deliveries 4 Tearing No  but episiotomy with first C-section deliveries 0 Currently pregnant No  PROLAPSE: None   OBJECTIVE:  01/07/23: -DRA: 1 finger width separation with distortion -Sit-up test: 0/3 - unable and distortion present with effort -Transversus abdominus: able to activate with cuing - when coached with breathing and chin-up, improved distortion -Tenderbess in Rt upper quadrant with scar tissue restriction -MMT hip strength: 5/5 grossly    DIAGNOSTIC FINDINGS:    COGNITION: Overall cognitive status: Within functional limits for tasks assessed     SENSATION: Light touch: Deficits fingers and feet tingling Proprioception: Appears intact  MUSCLE LENGTH: Bil hamstrings and adductors limited blue bandy 25%     POSTURE: rounded shoulders, forward head, and posterior pelvic tilt   LUMBARAROM/PROM:  A/PROM A/PROM  eval  Flexion WFL  Extension WFL  Right lateral flexion Limited by 25%  Left lateral flexion Limited by 25%  Right rotation WFL  Left rotation WFL   (Blank rows = not tested)  LOWER EXTREMITY  ROM:  WFL  LOWER EXTREMITY MMT:  RT hips and adductors limited by 50% Lt hips and adductors limited by 25%  PALPATION:   General  TTP throughout Rt abdominal quadrants with noted fascial restrictions, Rt external obliques, Rt internal obliques,  Rt psoas and with palpation in Rt abdomen pt reports she feels her Rt hip pain replicated                External Perineal Exam deferred until next visit                             Internal Pelvic Floor deferred until next visit  Patient confirms identification and approves PT to assess internal pelvic floor and treatment No  PELVIC MMT:   MMT eval  Vaginal   Internal Anal Sphincter   External Anal Sphincter   Puborectalis   Diastasis Recti Coning noted above umbilicus with active abdominal shoulder lift.    (Blank rows = not tested)        TONE: deferred until next visit  PROLAPSE: deferred until next visit  TODAY'S TREATMENT  03/12/23 Pt seen for aquatic therapy today.  Treatment took place in water 3.5-4.75 ft in depth at the [facility] pool. Temp of water was 91.  Pt entered/exited the pool via stairs independently with bilat rail.  (Pt faces towards hot tub due to L shorter LE) * without support:  walking forward/ backward / side stepping with arm addct (UE increased pain - repeated without UE) * shoulder flex/ext with wrist flex/ext "paint the fence" , soft normal stance * R/L wrist flexor / extensor stretch  * holding wall: single leg clams 2 x 10 each (adjusting height and range of RLE);  * return to walking backward / forward with reciprocal arm swing * TrA set with short blue noodle pull down x 10; with tricep push downs rainbow hand floats x 5 * TrA set in staggered stance with bilat shoulder horiz abdct/ addct x 8 each leg forward * normal stance with bilat shoulder IR/ER (elbows tucked), shoulder addct/abdct with palm up/down; bicep/tricep * supported back float with nekdoodle and blue noodle under legs -  then UE snow angels for UE ROM; LUE nerve glides x 10, then return to snow angels    Pt requires the buoyancy and hydrostatic pressure of water for support, and to offload joints by unweighting joint load by at least 50 % in navel deep water and by at least 75-80% in chest to neck deep water.  Viscosity of the water is needed for resistance of strengthening. Water current perturbations provides challenge to standing balance requiring increased core activation.   02/26/23 Pt seen for aquatic therapy today.  Treatment took place in water 3.5-4.75 ft in depth at the [facility] pool. Temp of water was 91.  Pt entered/exited the pool via stairs independently with bilat rail.  (Pt faces towards hot tub due to L shorter LE) * without support:  walking forward/ backward / side stepping with arm addct * holding wall: hip abdct/ addct x 10; single leg clams x 10 each;   straight LE circles CW/ CCW;  * return to walking backward / forward with reciprocal arm swing * TrA set with short blue noodle pull down - > solid noodle * staggered stance with bilat shoulder horiz abdct/ addct;  with tricep push downs yellow -> rainbow hand floats * yellow noodle under arms in corner:  knee tucks, then knee tucks L and R  Pt requires the buoyancy and hydrostatic pressure of water for support, and to offload joints by unweighting joint load by at least 50 % in navel deep water and by at least 75-80% in chest  to neck deep water.  Viscosity of the water is needed for resistance of strengthening. Water current perturbations provides challenge to standing balance requiring increased core activation.   TODAY'S TREATMENT 01/28/23 Neuromuscular re-education: Seated chop 3lb 10x bil Seated chop with ball press 10x bil Seated march with UE ball press 2 x 10 Exercises: Seated hip IR 2 x 10 yellow loop, one leg at a time Seated hip abduction 2 x 10 yellow loop Seated hip adduction 2 x 10 ball squeeze  Seated thoracic  openers 10x bil Seated lateral flexion 60 sec bil Seated forward fold 10 breaths  Therapeutic activities: Fiber education - different types of fiber, focusing on insoluble fiber when she is having difficulty with bowel movements Aquatic therapy intervention    TREATMENT 01/21/23 Neuromuscular re-education: Supine UE ball press 2 x 10 Sidelying UE ball press 2 x 10 bil Pelvic tilts 2 x 10 Exercises: Seated hip IR 2 x 10 yellow loop, one leg at a time Seated hip IR 2 x 10 yellow look bil Seated hip abduction 2 x 10 yellow loop Seated hip adduction 2 x 10 ball squeeze    TREATMENT 01/07/23 RE-EVALUATION Manual: Bowel mobilization Abdominal scar tissue mobilization (Rt upper quadrant) Exercises: Open books 10x bil Bent knee fall out 10x bil Wide knee lower trunk rotation 3 x 10 Therapeutic activities: Squatty potty Self-bowel massage Water intake Tea    PATIENT EDUCATION:  Education details:  aquatic therapy progressions / modifications  Person educated: Patient Education method: Programmer, multimedia, Facilities manager, Actor cues, Verbal cues, and Handouts Education comprehension: verbalized understanding and returned demonstration  HOME EXERCISE PROGRAM: [id number]  ASSESSMENT:  CLINICAL IMPRESSION: Pt is instructed in gentle ROM and strengthening exercises; tolerated all well.  Required use of  less resistance in UE today due to elevated pain in UE and neck.  Some LUE radicular symptoms with supine snow angels; reduced after LUE nerve glides.    Pt would benefit from additional PT to further address deficits.  Goals are ongoing. Will begin to create aquatic HEP for use after d/c.   OBJECTIVE IMPAIRMENTS: decreased coordination, decreased endurance, decreased mobility, decreased strength, increased fascial restrictions, increased muscle spasms, impaired flexibility, improper body mechanics, postural dysfunction, and pain.   ACTIVITY LIMITATIONS: carrying, lifting, sitting,  squatting, continence, and locomotion level  PARTICIPATION LIMITATIONS: interpersonal relationship and community activity  PERSONAL FACTORS: Fitness, Time since onset of injury/illness/exacerbation, and 1 comorbidity: medical history  are also affecting patient's functional outcome.   REHAB POTENTIAL: Good  CLINICAL DECISION MAKING: Stable/uncomplicated  EVALUATION COMPLEXITY: Low   GOALS: Goals reviewed with patient? Yes  SHORT TERM GOALS: Target date: 10/27/22 - updated 11/25/22 - updated 01/07/23 with new target of 04/01/23  Pt to be I with HEP. Baseline: Goal status:MET 11/25/22  2.  Pt will report no  more than 4/10 pain due to improvements in posture, strength, and muscle length  Baseline:  Goal status: IN PROGRESS  3.  Pt to be I with voiding mechanics and breathing mechanics to decrease strain at pelvic floor and improve bowel and bladder habits.  Baseline:  Goal status:MET 11/25/22  4.  Pt to be I with abdominal massage for improved bowel habits.  Baseline: pt stopped performing - will start again Goal status: IN PROGRESS  LONG TERM GOALS: Target date: 12/30/21 updated 11/25/22 - updated 01/07/23 with new target of 04/01/23  Pt to be I with advanced HEP.  Baseline:  Goal status: IN PROGRESS  2.  Pt to demonstrate at least  5/5 bil hip strength for improved pelvic stability and functional squats without leakage.  Baseline:  Goal status: MET 01/07/23  3. Pt will report 2/10 pain due to improvements in posture, strength, and muscle length  Baseline:  Goal status:IN PROGRESS  4.  Pt will report type 3-4 on average for bowel movements based on stool chart for improved consistency and regularity.  Baseline:  Goal status: IN PROGRESS  5.  Pt to demonstrate minimal to no abdominal coning with core strengthening exercises for squatting for improved load transfer and decreased compensatory strategies.  Baseline:  Goal status: IN PROGRESS   PLAN:  PT FREQUENCY:  1x/week  PT DURATION:  8 sessions  PLANNED INTERVENTIONS: Therapeutic exercises, Therapeutic activity, Neuromuscular re-education, Patient/Family education, Self Care, Joint mobilization, Aquatic Therapy, Dry Needling, Spinal mobilization, Cryotherapy, Moist heat, scar mobilization, Taping, Biofeedback, and Manual therapy  PLAN FOR NEXT SESSION: Aquatic therapy intervention  Mayer Camel, PTA 03/12/23 1:14 PM [facility] [address] [place], [place], [address] Phone: [phone]   Fax:  [phone]

## 2023-03-17 ENCOUNTER — Encounter (HOSPITAL_BASED_OUTPATIENT_CLINIC_OR_DEPARTMENT_OTHER): Payer: Self-pay | Admitting: Physical Therapy

## 2023-03-17 ENCOUNTER — Ambulatory Visit (HOSPITAL_BASED_OUTPATIENT_CLINIC_OR_DEPARTMENT_OTHER): Payer: BC Managed Care – PPO | Admitting: Physical Therapy

## 2023-03-17 DIAGNOSIS — M6281 Muscle weakness (generalized): Secondary | ICD-10-CM

## 2023-03-17 DIAGNOSIS — R279 Unspecified lack of coordination: Secondary | ICD-10-CM

## 2023-03-17 DIAGNOSIS — R293 Abnormal posture: Secondary | ICD-10-CM

## 2023-03-17 NOTE — Therapy (Signed)
OUTPATIENT PHYSICAL THERAPY TREATMENT   Patient Name: Emily Joseph MRN: [id number] DOB:03-Aug-1971, 52 y.o., female Today's Date: 03/17/2023  END OF SESSION:  PT End of Session - 03/17/23 0953     Visit Number 11    Date for PT Re-Evaluation 04/01/23    Authorization Type [employer]    PT Start Time [phone]    PT Stop Time 1030    PT Time Calculation (min) 41 min    Behavior During Therapy [facility] for tasks assessed/performed                Past Medical History:  Diagnosis Date   Adenomyosis    not papanicolaou smear of cervix and cervical HPV   Allergy    Anxiety 06/08/2016   -about her health and about taking medications   Arthritis    ?   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 Nickola Major in 2017, [place] rheum -seeing Dr. Lucia Gaskins, Neurologist for back pain with radicular symptoms   Complication of anesthesia    "with epidural bp bottoms out"   Dysrhythmia    fast hr   Eosinophilic esophagitis    Sees Dr. Leone Payor   Fibromyalgia    GERD (gastroesophageal reflux disease)    H/O laminectomy 09/07/2020   Heart murmur    History of atrial flutter 10/2016   spontaneous conversion to sinus   History of gallstones    History of laparoscopic-assisted vaginal hysterectomy 09/07/2020   History of pneumonia    when pt was pregnant   History of pneumothorax 07/2016   Right   History of sinus tachycardia    Iron deficiency anemia due to chronic blood loss    Menorrhagia, s/p complete hysterectomy, ovaries remain hx   Irritable bowel syndrome 01/27/2013   Dr Leone Payor 02/2016    Microhematuria    Mouth problem    failed gum graft 1/16   Nasal airway abnormality    Nasal obstruction 06/26/2015   Seen by ENT 06-26-15.  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   Shortness of breath dyspnea    SUI (stress urinary incontinence, female)    Syncope    with last child with epideral   UTI (urinary tract infection) 07/14/2016   >=100,000 COLONIES/mL KLEBSIELLA PNEUMONIA   Vitamin D deficiency    Past Surgical History:  Procedure Laterality Date   APPENDECTOMY  01/1989   BALLOON DILATION N/A 02/09/2013   Procedure: BALLOON DILATION;  Surgeon: Willis Modena, MD;  Location: [facility];  Service: Endoscopy;  Laterality: N/A;   BILATERAL SALPINGECTOMY N/A 06/14/2014   Procedure: BILATERAL SALPINGECTOMY;  Surgeon: Leslie Andrea, MD;  Location: [facility];  Service: Gynecology;  Laterality: N/A;   BIOPSY  09/04/2021   Procedure: BIOPSY;  Surgeon: Iva Boop, MD;  Location: [facility];  Service: Endoscopy;;   BREAST BIOPSY Right    biopsy    CHOLECYSTECTOMY N/A 11/21/2019   Procedure: LAPAROSCOPIC CHOLECYSTECTOMY WITH INTRAOPERATIVE CHOLANGIOGRAM;  Surgeon: Ovidio Kin, MD;  Location: [facility];  Service:  General;  Laterality: N/A;   COLONOSCOPY WITH PROPOFOL N/A 02/09/2013   Procedure: COLONOSCOPY WITH PROPOFOL;  Surgeon: Willis Modena, MD;  Location: [facility];  Service: Endoscopy;  Laterality: N/A;  need ultra thin colon scope   ESOPHAGEAL DILATION  09/04/2021   Procedure: ESOPHAGEAL DILATION;  Surgeon: Iva Boop, MD;  Location: [facility];  Service: Endoscopy;;   ESOPHAGOGASTRODUODENOSCOPY Left 06/30/2021   Procedure: ESOPHAGOGASTRODUODENOSCOPY (EGD);  Surgeon: Shellia Cleverly, DO;  Location: [facility];  Service: Gastroenterology;  Laterality: Left;   ESOPHAGOGASTRODUODENOSCOPY (EGD) WITH PROPOFOL N/A 02/09/2013   Procedure: ESOPHAGOGASTRODUODENOSCOPY (EGD) WITH PROPOFOL;  Surgeon: Willis Modena, MD;  [facility]: [facility];  Service: Endoscopy;  Laterality: N/A;    ESOPHAGOGASTRODUODENOSCOPY (EGD) WITH PROPOFOL N/A 09/04/2021   Procedure: ESOPHAGOGASTRODUODENOSCOPY (EGD) WITH PROPOFOL;  Surgeon: Iva Boop, MD;  Location: [facility];  Service: Endoscopy;  Laterality: N/A;   FOREIGN BODY REMOVAL  06/30/2021   Procedure: FOREIGN BODY REMOVAL;  Surgeon: Shellia Cleverly, DO;  Location: [facility];  Service: Gastroenterology;;   LAPAROSCOPIC ASSISTED VAGINAL HYSTERECTOMY Right 06/14/2014   Procedure: OPEN LAPAROSCOPIC ASSISTED VAGINAL HYSTERECTOMY;  Surgeon: Leslie Andrea, MD;  Location: [facility];  Service: Gynecology;  Laterality: Right;   LYSIS OF ADHESION N/A 06/14/2014   Procedure: LYSIS OF ADHESION;  Surgeon: Leslie Andrea, MD;  Location: [facility];  Service: Gynecology;  Laterality: N/A;   MINIMALLY INVASIVE EXCISION OF ATRIAL MYXOMA Right 07/11/2016   Procedure: MINIMALLY INVASIVE RESECTION OF RIGHT ATRIAL LIPOMA WITH CLOSURE OF PATENT FORAMEN OVALE;  Surgeon: Purcell Nails, MD;  Location: [facility];  Service: Open Heart Surgery;  Laterality: Right;   OVARIAN CYST REMOVAL Right 1990   TEE WITHOUT CARDIOVERSION N/A 07/11/2016   Procedure: TRANSESOPHAGEAL ECHOCARDIOGRAM (TEE);  Surgeon: Purcell Nails, MD;  Location: [facility] OR;  Service: Open Heart Surgery;  Laterality: N/A;   Patient Active Problem List   Diagnosis Date Noted   Pain in right hip 03/12/2022   Esophageal web    Female stress incontinence 09/07/2020   Heart disease 09/07/2020   Primary osteoarthritis of left knee 05/31/2020   At high risk for breast cancer 08/30/2019   Pelvic floor dysfunction 12/28/2018   Chronic RUQ pain - exacerbation 12/28/2018   Atrial flutter with rapid ventricular response (HCC) 10/27/2016   s/p minimally invasive resection of right atrial lipoma    Chronic pain 06/08/2016   Chronic fatigue 06/08/2016   GERD (gastroesophageal reflux disease) with esophagitis 06/08/2016   Palpitations 06/08/2016   Anxiety 06/08/2016   Multiple environmental allergies  03/04/2016   Multiple food allergies 03/04/2016   Irritable bowel syndrome with constipation 01/27/2013   Fibromyalgia 01/07/2007    PCP: Natalia Leatherwood, DO  REFERRING PROVIDER: Iva Boop, MD   REFERRING DIAG: K59.09 (ICD-10-CM) - Chronic constipation  Rationale for Evaluation and Treatment: Rehabilitation  THERAPY DIAG:  Muscle weakness (generalized)  Abnormal posture  Unspecified lack of coordination  ONSET DATE: chronic  SUBJECTIVE:  SUBJECTIVE STATEMENT:  Pt reports she had a recent flare up after driving her aunt back to [facility].  She is starting to feel better, but is stiff.    She plans to use the [place] (outdoor) pool after completing PT.      PERTINENT HISTORY:  Constipation, DRA, external hemorrhoid, rectocele, OA, fibromyalgia, prior Rt atrial lipoma and flutter, reports she has had Rt ovary cyst removed as a teenager, gallbladder removed 2021, LAVH 2014   Fluid intake: Yes: water - 80-100oz water per day, one cup coffee in morning.     PAIN:  Are you having pain? yes NPRS scale: 3/10  generalized with focus to- hands/neck/hips Pain location:  bil hip pain is usually the worst pain. Does have current Rt lipoma on hip.   Pain type: aching and tight Pain description: constant   Aggravating factors: lifting laundry, twisting, stress, driving Relieving factors: squatting helps with stretching  PRECAUTIONS: None  WEIGHT BEARING RESTRICTIONS: No  FALLS:  Has patient fallen in last 6 months? 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   PLOF: Independent  PATIENT GOALS: to have less pain, more regular bowels, strengthening DRA   Sexual abuse: No  BOWEL  MOVEMENT: Pain with bowel movement: Yes - with hemorrhoids  Type of bowel movement:Type (Bristol Stool Scale) 2-6, Frequency daily  to every two days, and Strain Yes ( a little)  Fully empty rectum: No not all the time Leakage: No Pads: No Fiber supplement: Yes: uses miralax or softener if needed but not daily   URINATION: Pain with urination: No Fully empty bladder: No not always Stream: Strong and Weak Urgency: Yes: sometimes with increased water intake Frequency: not more than every 2 hours Leakage: Coughing and Sneezing only strongly, very small amount  Pads: No  INTERCOURSE: Pain with intercourse: During Penetration and sometimes makes right hip pain worse Ability to have vaginal penetration:  Yes:   Climax: able to climax without pain  Marinoff Scale: 2/3 Menopause has started and does have a lot dryness   PREGNANCY: Vaginal deliveries 4 Tearing No  but episiotomy with first C-section deliveries 0 Currently pregnant No  PROLAPSE: None   OBJECTIVE:  01/07/23: -DRA: 1 finger width separation with distortion -Sit-up test: 0/3 - unable and distortion present with effort -Transversus abdominus: able to activate with cuing - when coached with breathing and chin-up, improved distortion -Tenderbess in Rt upper quadrant with scar tissue restriction -MMT hip strength: 5/5 grossly    DIAGNOSTIC FINDINGS:    COGNITION: Overall cognitive status: Within functional limits for tasks assessed     SENSATION: Light touch: Deficits fingers and feet tingling Proprioception: Appears intact  MUSCLE LENGTH: Bil hamstrings and adductors limited blue bandy 25%     POSTURE: rounded shoulders, forward head, and posterior pelvic tilt   LUMBARAROM/PROM:  A/PROM A/PROM  eval  Flexion WFL  Extension WFL  Right lateral flexion Limited by 25%  Left lateral flexion Limited by 25%  Right rotation WFL  Left rotation WFL   (Blank rows = not tested)  LOWER EXTREMITY  ROM:  WFL  LOWER EXTREMITY MMT:  RT hips and adductors limited by 50% Lt hips and adductors limited by 25%  PALPATION:   General  TTP throughout Rt abdominal quadrants with noted fascial restrictions, Rt external obliques, Rt internal obliques, Rt psoas and with palpation in Rt abdomen pt reports she feels her Rt hip  pain replicated                External Perineal Exam deferred until next visit                             Internal Pelvic Floor deferred until next visit  Patient confirms identification and approves PT to assess internal pelvic floor and treatment No  PELVIC MMT:   MMT eval  Vaginal   Internal Anal Sphincter   External Anal Sphincter   Puborectalis   Diastasis Recti Coning noted above umbilicus with active abdominal shoulder lift.    (Blank rows = not tested)        TONE: deferred until next visit  PROLAPSE: deferred until next visit  TODAY'S TREATMENT  03/17/23 Pt seen for aquatic therapy today.  Treatment took place in water 3.5-4.75 ft in depth at the [facility] pool. Temp of water was 91.  Pt entered/exited the pool via stairs independently with bilat rail.  (Pt faces towards hot tub due to L shorter LE) * without support:  walking forward/ backward / side stepping with arm addct * holding wall:  single leg clams 2 x 5;  LE circles x 8 CW/CCW * TrA set with short blue noodle pull down x 10 * return to walking forward/ backward with reciprocal arm swing * TrA set in staggered stance with bilat shoulder horiz abdct/ addct x 5 each leg forward rainbow hand floats; unilateral shoulder addct with rainbow hand floats; tricep press downs x 10 * bilat rainbow hand floats at side with backward walking * walking forward/ backward with gentle alternating punches of arms with rainbow hand floats * bow and arrow with LE step back, UE on rainbow floats x ~8 each * return to walking forward/ backward with alternating arm row/punch * at bench: cycling * at  bench with feet on blue step:  STS with eccentric lowering x 5   Pt requires the buoyancy and hydrostatic pressure of water for support, and to offload joints by unweighting joint load by at least 50 % in navel deep water and by at least 75-80% in chest to neck deep water.  Viscosity of the water is needed for resistance of strengthening. Water current perturbations provides challenge to standing balance requiring increased core activation.  03/12/23 Pt seen for aquatic therapy today.  Treatment took place in water 3.5-4.75 ft in depth at the [facility] pool. Temp of water was 91.  Pt entered/exited the pool via stairs independently with bilat rail.  (Pt faces towards hot tub due to L shorter LE) * without support:  walking forward/ backward / side stepping with arm addct (UE increased pain - repeated without UE) * shoulder flex/ext with wrist flex/ext "paint the fence" , soft normal stance * R/L wrist flexor / extensor stretch  * holding wall: single leg clams 2 x 10 each (adjusting height and range of RLE);  * return to walking backward / forward with reciprocal arm swing * TrA set with short blue noodle pull down x 10; with tricep push downs rainbow hand floats x 5 * TrA set in staggered stance with bilat shoulder horiz abdct/ addct x 8 each leg forward * normal stance with bilat shoulder IR/ER (elbows tucked), shoulder addct/abdct with palm up/down; bicep/tricep * supported back float with nekdoodle and blue noodle under legs - then UE snow angels for UE ROM; LUE nerve glides x 10, then return  to snow angels    Pt requires the buoyancy and hydrostatic pressure of water for support, and to offload joints by unweighting joint load by at least 50 % in navel deep water and by at least 75-80% in chest to neck deep water.  Viscosity of the water is needed for resistance of strengthening. Water current perturbations provides challenge to standing balance requiring increased core  activation.   02/26/23 Pt seen for aquatic therapy today.  Treatment took place in water 3.5-4.75 ft in depth at the [facility] pool. Temp of water was 91.  Pt entered/exited the pool via stairs independently with bilat rail.  (Pt faces towards hot tub due to L shorter LE) * without support:  walking forward/ backward / side stepping with arm addct * holding wall: hip abdct/ addct x 10; single leg clams x 10 each;   straight LE circles CW/ CCW;  * return to walking backward / forward with reciprocal arm swing * TrA set with short blue noodle pull down - > solid noodle * staggered stance with bilat shoulder horiz abdct/ addct;  with tricep push downs yellow -> rainbow hand floats * yellow noodle under arms in corner:  knee tucks, then knee tucks L and R  Pt requires the buoyancy and hydrostatic pressure of water for support, and to offload joints by unweighting joint load by at least 50 % in navel deep water and by at least 75-80% in chest to neck deep water.  Viscosity of the water is needed for resistance of strengthening. Water current perturbations provides challenge to standing balance requiring increased core activation.   TODAY'S TREATMENT 01/28/23 Neuromuscular re-education: Seated chop 3lb 10x bil Seated chop with ball press 10x bil Seated march with UE ball press 2 x 10 Exercises: Seated hip IR 2 x 10 yellow loop, one leg at a time Seated hip abduction 2 x 10 yellow loop Seated hip adduction 2 x 10 ball squeeze  Seated thoracic openers 10x bil Seated lateral flexion 60 sec bil Seated forward fold 10 breaths  Therapeutic activities: Fiber education - different types of fiber, focusing on insoluble fiber when she is having difficulty with bowel movements Aquatic therapy intervention    TREATMENT 01/21/23 Neuromuscular re-education: Supine UE ball press 2 x 10 Sidelying UE ball press 2 x 10 bil Pelvic tilts 2 x 10 Exercises: Seated hip IR 2 x 10 yellow loop, one  leg at a time Seated hip IR 2 x 10 yellow look bil Seated hip abduction 2 x 10 yellow loop Seated hip adduction 2 x 10 ball squeeze    TREATMENT 01/07/23 RE-EVALUATION Manual: Bowel mobilization Abdominal scar tissue mobilization (Rt upper quadrant) Exercises: Open books 10x bil Bent knee fall out 10x bil Wide knee lower trunk rotation 3 x 10 Therapeutic activities: Squatty potty Self-bowel massage Water intake Tea    PATIENT EDUCATION:  Education details:  aquatic therapy progressions / modifications  Person educated: Patient Education method: Explanation, Demonstration, Actor cues, Verbal cues, and Handouts Education comprehension: verbalized understanding and returned demonstration  HOME EXERCISE PROGRAM: [email or web address]  AQUATIC HEP Access Code: [email or web address] URL: [email or web address] Date: 03/17/2023   - not issued yet.  Prepared by: [facility] This aquatic home exercise program from [employer] utilizes pictures from land based exercises, but has been adapted prior to lamination and issuance.    ASSESSMENT:  CLINICAL IMPRESSION: Pt is instructed in gentle ROM and strengthening exercises; tolerated all well without increase in pain.  Pt requested more exercises next visit utilizing noodles/hand floats - for her HEP.     Pt would benefit from additional PT to further address deficits.  Goals are ongoing. Will plan to issue laminated HEP on next (last) aquatic visit.   OBJECTIVE IMPAIRMENTS: decreased coordination, decreased endurance, decreased mobility, decreased strength, increased fascial restrictions, increased muscle spasms, impaired flexibility, improper body mechanics, postural dysfunction, and pain.   ACTIVITY LIMITATIONS: carrying, lifting, sitting, squatting, continence, and locomotion level  PARTICIPATION LIMITATIONS: interpersonal relationship and community activity  PERSONAL FACTORS: Fitness, Time since onset of  injury/illness/exacerbation, and 1 comorbidity: medical history  are also affecting patient's functional outcome.   REHAB POTENTIAL: Good  CLINICAL DECISION MAKING: Stable/uncomplicated  EVALUATION COMPLEXITY: Low   GOALS: Goals reviewed with patient? Yes  SHORT TERM GOALS: Target date: 10/27/22 - updated 11/25/22 - updated 01/07/23 with new target of 04/01/23  Pt to be I with HEP. Baseline: Goal status:MET 11/25/22  2.  Pt will report no  more than 4/10 pain due to improvements in posture, strength, and muscle length  Baseline:  Goal status: IN PROGRESS  3.  Pt to be I with voiding mechanics and breathing mechanics to decrease strain at pelvic floor and improve bowel and bladder habits.  Baseline:  Goal status:MET 11/25/22  4.  Pt to be I with abdominal massage for improved bowel habits.  Baseline: pt stopped performing - will start again Goal status: IN PROGRESS  LONG TERM GOALS: Target date: 12/30/21 updated 11/25/22 - updated 01/07/23 with new target of 04/01/23  Pt to be I with advanced HEP.  Baseline:  Goal status: IN PROGRESS  2.  Pt to demonstrate at least 5/5 bil hip strength for improved pelvic stability and functional squats without leakage.  Baseline:  Goal status: MET 01/07/23  3. Pt will report 2/10 pain due to improvements in posture, strength, and muscle length  Baseline:  Goal status:IN PROGRESS  4.  Pt will report type 3-4 on average for bowel movements based on stool chart for improved consistency and regularity.  Baseline:  Goal status: IN PROGRESS  5.  Pt to demonstrate minimal to no abdominal coning with core strengthening exercises for squatting for improved load transfer and decreased compensatory strategies.  Baseline:  Goal status: IN PROGRESS   PLAN:  PT FREQUENCY: 1x/week  PT DURATION:  8 sessions  PLANNED INTERVENTIONS: Therapeutic exercises, Therapeutic activity, Neuromuscular re-education, Patient/Family education, Self Care, Joint  mobilization, Aquatic Therapy, Dry Needling, Spinal mobilization, Cryotherapy, Moist heat, scar mobilization, Taping, Biofeedback, and Manual therapy  PLAN FOR NEXT SESSION: Aquatic therapy intervention  Mayer Camel, PTA 03/17/23 10:41 AM [facility] [address] [place], [place], [address] Phone: [phone]   Fax:  [phone]

## 2023-03-23 ENCOUNTER — Encounter: Payer: Self-pay | Admitting: Internal Medicine

## 2023-03-23 DIAGNOSIS — Z1211 Encounter for screening for malignant neoplasm of colon: Secondary | ICD-10-CM

## 2023-03-23 DIAGNOSIS — R55 Syncope and collapse: Secondary | ICD-10-CM

## 2023-03-26 ENCOUNTER — Encounter (HOSPITAL_BASED_OUTPATIENT_CLINIC_OR_DEPARTMENT_OTHER): Payer: Self-pay | Admitting: Physical Therapy

## 2023-03-26 ENCOUNTER — Ambulatory Visit (HOSPITAL_BASED_OUTPATIENT_CLINIC_OR_DEPARTMENT_OTHER): Payer: BC Managed Care – PPO | Admitting: Physical Therapy

## 2023-03-26 DIAGNOSIS — R293 Abnormal posture: Secondary | ICD-10-CM

## 2023-03-26 DIAGNOSIS — M6281 Muscle weakness (generalized): Secondary | ICD-10-CM | POA: Diagnosis not present

## 2023-03-26 DIAGNOSIS — R279 Unspecified lack of coordination: Secondary | ICD-10-CM

## 2023-03-26 NOTE — Therapy (Signed)
OUTPATIENT PHYSICAL THERAPY TREATMENT   Patient Name: Emily Joseph MRN: [id number] DOB:1971/02/20, 52 y.o., female Today's Date: 03/26/2023  END OF SESSION:  PT End of Session - 03/26/23 1046     Visit Number 12    Date for PT Re-Evaluation 04/01/23    Authorization Type BC[employer]    PT Start Time 1040    PT Stop Time 1120    PT Time Calculation (min) 40 min    Behavior During Therapy [name] for tasks assessed/performed                Past Medical History:  Diagnosis Date   Adenomyosis    not papanicolaou smear of cervix and cervical HPV   Allergy    Anxiety 06/08/2016   -about her health and about taking medications   Arthritis    ?   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 Nickola Major in 2017, [place] rheum -seeing Dr. Lucia Gaskins, Neurologist for back pain with radicular symptoms   Complication of anesthesia    "with epidural bp bottoms out"   Dysrhythmia    fast hr   Eosinophilic esophagitis    Sees Dr. Leone Payor   Fibromyalgia    GERD (gastroesophageal reflux disease)    H/O laminectomy 09/07/2020   Heart murmur    History of atrial flutter 10/2016   spontaneous conversion to sinus   History of gallstones    History of laparoscopic-assisted vaginal hysterectomy 09/07/2020   History of pneumonia    when pt was pregnant   History of pneumothorax 07/2016   Right   History of sinus tachycardia    Iron deficiency anemia due to chronic blood loss    Menorrhagia, s/p complete hysterectomy, ovaries remain hx   Irritable bowel syndrome 01/27/2013   Dr Leone Payor 02/2016    Microhematuria    Mouth problem    failed gum graft 1/16   Nasal airway abnormality    Nasal obstruction 06/26/2015   Seen by ENT 06-26-15.  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   Shortness of breath dyspnea    SUI (stress urinary incontinence, female)    Syncope    with last child with epideral   UTI (urinary tract infection) 07/14/2016   >=100,000 COLONIES/mL KLEBSIELLA PNEUMONIA   Vitamin D deficiency    Past Surgical History:  Procedure Laterality Date   APPENDECTOMY  01/1989   BALLOON DILATION N/A 02/09/2013   Procedure: BALLOON DILATION;  Surgeon: Willis Modena, MD;  Location: [facility];  Service: Endoscopy;  Laterality: N/A;   BILATERAL SALPINGECTOMY N/A 06/14/2014   Procedure: BILATERAL SALPINGECTOMY;  Surgeon: Leslie Andrea, MD;  Location: [facility];  Service: Gynecology;  Laterality: N/A;   BIOPSY  09/04/2021   Procedure: BIOPSY;  Surgeon: Iva Boop, MD;  [facility]: [facility];  Service: Endoscopy;;   BREAST BIOPSY Right    biopsy    CHOLECYSTECTOMY N/A 11/21/2019   Procedure: LAPAROSCOPIC CHOLECYSTECTOMY WITH INTRAOPERATIVE CHOLANGIOGRAM;  Surgeon: Ovidio Kin, MD;  Location: [facility];  Service:  General;  Laterality: N/A;   COLONOSCOPY WITH PROPOFOL N/A 02/09/2013   Procedure: COLONOSCOPY WITH PROPOFOL;  Surgeon: Willis Modena, MD;  [facility]: [facility];  Service: Endoscopy;  Laterality: N/A;  need ultra thin colon scope   ESOPHAGEAL DILATION  09/04/2021   Procedure: ESOPHAGEAL DILATION;  Surgeon: Iva Boop, MD;  [facility]: [facility];  Service: Endoscopy;;   ESOPHAGOGASTRODUODENOSCOPY Left 06/30/2021   Procedure: ESOPHAGOGASTRODUODENOSCOPY (EGD);  Surgeon: Shellia Cleverly, DO;  Location: [facility];  Service: Gastroenterology;  Laterality: Left;   ESOPHAGOGASTRODUODENOSCOPY (EGD) WITH PROPOFOL N/A 02/09/2013   Procedure: ESOPHAGOGASTRODUODENOSCOPY (EGD) WITH PROPOFOL;  Surgeon: Willis Modena, MD;  [facility];  Service: Endoscopy;  Laterality: N/A;    ESOPHAGOGASTRODUODENOSCOPY (EGD) WITH PROPOFOL N/A 09/04/2021   Procedure: ESOPHAGOGASTRODUODENOSCOPY (EGD) WITH PROPOFOL;  Surgeon: Iva Boop, MD;  [facility];  Service: Endoscopy;  Laterality: N/A;   FOREIGN BODY REMOVAL  06/30/2021   Procedure: FOREIGN BODY REMOVAL;  Surgeon: Shellia Cleverly, DO;  Location: [facility];  Service: Gastroenterology;;   LAPAROSCOPIC ASSISTED VAGINAL HYSTERECTOMY Right 06/14/2014   Procedure: OPEN LAPAROSCOPIC ASSISTED VAGINAL HYSTERECTOMY;  Surgeon: Leslie Andrea, MD;  Location: [facility];  Service: Gynecology;  Laterality: Right;   LYSIS OF ADHESION N/A 06/14/2014   Procedure: LYSIS OF ADHESION;  Surgeon: Leslie Andrea, MD;  Location: [facility];  Service: Gynecology;  Laterality: N/A;   MINIMALLY INVASIVE EXCISION OF ATRIAL MYXOMA Right 07/11/2016   Procedure: MINIMALLY INVASIVE RESECTION OF RIGHT ATRIAL LIPOMA WITH CLOSURE OF PATENT FORAMEN OVALE;  Surgeon: Purcell Nails, MD;  Location: [facility];  Service: Open Heart Surgery;  Laterality: Right;   OVARIAN CYST REMOVAL Right 1990   TEE WITHOUT CARDIOVERSION N/A 07/11/2016   Procedure: TRANSESOPHAGEAL ECHOCARDIOGRAM (TEE);  Surgeon: Purcell Nails, MD;  Location: [facility] OR;  Service: Open Heart Surgery;  Laterality: N/A;   Patient Active Problem List   Diagnosis Date Noted   Pain in right hip 03/12/2022   Esophageal web    Female stress incontinence 09/07/2020   Heart disease 09/07/2020   Primary osteoarthritis of left knee 05/31/2020   At high risk for breast cancer 08/30/2019   Pelvic floor dysfunction 12/28/2018   Chronic RUQ pain - exacerbation 12/28/2018   Atrial flutter with rapid ventricular response (HCC) 10/27/2016   s/p minimally invasive resection of right atrial lipoma    Chronic pain 06/08/2016   Chronic fatigue 06/08/2016   GERD (gastroesophageal reflux disease) with esophagitis 06/08/2016   Palpitations 06/08/2016   Anxiety 06/08/2016   Multiple environmental allergies  03/04/2016   Multiple food allergies 03/04/2016   Irritable bowel syndrome with constipation 01/27/2013   Fibromyalgia 01/07/2007    PCP: Natalia Leatherwood, DO  REFERRING PROVIDER: Iva Boop, MD   REFERRING DIAG: K59.09 (ICD-10-CM) - Chronic constipation  Rationale for Evaluation and Treatment: Rehabilitation  THERAPY DIAG:  Muscle weakness (generalized)  Abnormal posture  Unspecified lack of coordination  ONSET DATE: chronic  SUBJECTIVE:  SUBJECTIVE STATEMENT:  "It's been a rough week since I was here last".   Pt reports that she continues to be in a flare up.     PERTINENT HISTORY:  Constipation, DRA, external hemorrhoid, rectocele, OA, fibromyalgia, prior Rt atrial lipoma and flutter, reports she has had Rt ovary cyst removed as a teenager, gallbladder removed 2021, LAVH 2014   Fluid intake: Yes: water - 80-100oz water per day, one cup coffee in morning.     PAIN:  Are you having pain? yes NPRS scale: 5/10  generalized with focus to- hands/neck/hips Pain location:  bil hip pain is usually the worst pain. Does have current Rt lipoma on hip.   Pain type: aching and tight Pain description: constant   Aggravating factors: lifting laundry, twisting, stress, driving Relieving factors: squatting helps with stretching  PRECAUTIONS: None  WEIGHT BEARING RESTRICTIONS: No  FALLS:  Has patient fallen in last 6 months? 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   PLOF: Independent  PATIENT GOALS: to have less pain, more regular bowels, strengthening DRA   Sexual abuse: No  BOWEL MOVEMENT: Pain with bowel movement: Yes - with hemorrhoids  Type of bowel movement:Type  (Bristol Stool Scale) 2-6, Frequency daily  to every two days, and Strain Yes ( a little)  Fully empty rectum: No not all the time Leakage: No Pads: No Fiber supplement: Yes: uses miralax or softener if needed but not daily   URINATION: Pain with urination: No Fully empty bladder: No not always Stream: Strong and Weak Urgency: Yes: sometimes with increased water intake Frequency: not more than every 2 hours Leakage: Coughing and Sneezing only strongly, very small amount  Pads: No  INTERCOURSE: Pain with intercourse: During Penetration and sometimes makes right hip pain worse Ability to have vaginal penetration:  Yes:   Climax: able to climax without pain  Marinoff Scale: 2/3 Menopause has started and does have a lot dryness   PREGNANCY: Vaginal deliveries 4 Tearing No  but episiotomy with first C-section deliveries 0 Currently pregnant No  PROLAPSE: None   OBJECTIVE:  01/07/23: -DRA: 1 finger width separation with distortion -Sit-up test: 0/3 - unable and distortion present with effort -Transversus abdominus: able to activate with cuing - when coached with breathing and chin-up, improved distortion -Tenderbess in Rt upper quadrant with scar tissue restriction -MMT hip strength: 5/5 grossly    DIAGNOSTIC FINDINGS:    COGNITION: Overall cognitive status: Within functional limits for tasks assessed     SENSATION: Light touch: Deficits fingers and feet tingling Proprioception: Appears intact  MUSCLE LENGTH: Bil hamstrings and adductors limited blue bandy 25%     POSTURE: rounded shoulders, forward head, and posterior pelvic tilt   LUMBARAROM/PROM:  A/PROM A/PROM  eval  Flexion WFL  Extension WFL  Right lateral flexion Limited by 25%  Left lateral flexion Limited by 25%  Right rotation WFL  Left rotation WFL   (Blank rows = not tested)  LOWER EXTREMITY ROM:  WFL  LOWER EXTREMITY MMT:  RT hips and adductors limited by 50% Lt hips and adductors  limited by 25%  PALPATION:   General  TTP throughout Rt abdominal quadrants with noted fascial restrictions, Rt external obliques, Rt internal obliques, Rt psoas and with palpation in Rt abdomen pt reports she feels her Rt hip pain replicated  External Perineal Exam deferred until next visit                             Internal Pelvic Floor deferred until next visit  Patient confirms identification and approves PT to assess internal pelvic floor and treatment No  PELVIC MMT:   MMT eval  Vaginal   Internal Anal Sphincter   External Anal Sphincter   Puborectalis   Diastasis Recti Coning noted above umbilicus with active abdominal shoulder lift.    (Blank rows = not tested)        TONE: deferred until next visit  PROLAPSE: deferred until next visit  TODAY'S TREATMENT  03/26/23 Pt seen for aquatic therapy today.  Treatment took place in water 3.5-4.75 ft in depth at the [facility] pool. Temp of water was 91.  Pt entered/exited the pool via stairs independently with bilat rail.  (Pt faces towards hot tub due to L shorter LE) * without support:  walking forward/ backward / side stepping with arm addct * with red small paddles on hands:  bilat shoulder horiz abdct/addc 10; unilateral shoulder addct/abdct x 10 each  * marching forward/ backward with alternating arm rows (no hand floats)  * holding wall:  LE circles CW/CCW ; alternating single leg clams * single leg noodle stomp with blue noodle (with hole) x 10 each LE, without UE support x 2 sets * TrA set with short blue noodle pull down x 8 * staggered stance with kick board row x 10 each side (change kickboard to be flat on surface)  * bow and arrow with LE step back, no floatation x ~5 each    Pt requires the buoyancy and hydrostatic pressure of water for support, and to offload joints by unweighting joint load by at least 50 % in navel deep water and by at least 75-80% in chest to neck deep water.   Viscosity of the water is needed for resistance of strengthening. Water current perturbations provides challenge to standing balance requiring increased core activation.  03/12/23 Pt seen for aquatic therapy today.  Treatment took place in water 3.5-4.75 ft in depth at the [facility] pool. Temp of water was 91.  Pt entered/exited the pool via stairs independently with bilat rail.  (Pt faces towards hot tub due to L shorter LE) * without support:  walking forward/ backward / side stepping with arm addct (UE increased pain - repeated without UE) * shoulder flex/ext with wrist flex/ext "paint the fence" , soft normal stance * R/L wrist flexor / extensor stretch  * holding wall: single leg clams 2 x 10 each (adjusting height and range of RLE);  * return to walking backward / forward with reciprocal arm swing * TrA set with short blue noodle pull down x 10; with tricep push downs rainbow hand floats x 5 * TrA set in staggered stance with bilat shoulder horiz abdct/ addct x 8 each leg forward * normal stance with bilat shoulder IR/ER (elbows tucked), shoulder addct/abdct with palm up/down; bicep/tricep * supported back float with nekdoodle and blue noodle under legs - then UE snow angels for UE ROM; LUE nerve glides x 10, then return to snow angels    Pt requires the buoyancy and hydrostatic pressure of water for support, and to offload joints by unweighting joint load by at least 50 % in navel deep water and by at least 75-80% in chest to neck deep water.  Viscosity of the water is needed for resistance of strengthening. Water current perturbations provides challenge to standing balance requiring increased core activation.   02/26/23 Pt seen for aquatic therapy today.  Treatment took place in water 3.5-4.75 ft in depth at the [facility] pool. Temp of water was 91.  Pt entered/exited the pool via stairs independently with bilat rail.  (Pt faces towards hot tub due to L shorter LE) *  without support:  walking forward/ backward / side stepping with arm addct * holding wall: hip abdct/ addct x 10; single leg clams x 10 each;   straight LE circles CW/ CCW;  * return to walking backward / forward with reciprocal arm swing * TrA set with short blue noodle pull down - > solid noodle * staggered stance with bilat shoulder horiz abdct/ addct;  with tricep push downs yellow -> rainbow hand floats * yellow noodle under arms in corner:  knee tucks, then knee tucks L and R  Pt requires the buoyancy and hydrostatic pressure of water for support, and to offload joints by unweighting joint load by at least 50 % in navel deep water and by at least 75-80% in chest to neck deep water.  Viscosity of the water is needed for resistance of strengthening. Water current perturbations provides challenge to standing balance requiring increased core activation.   01/28/23 Neuromuscular re-education: Seated chop 3lb 10x bil Seated chop with ball press 10x bil Seated march with UE ball press 2 x 10 Exercises: Seated hip IR 2 x 10 yellow loop, one leg at a time Seated hip abduction 2 x 10 yellow loop Seated hip adduction 2 x 10 ball squeeze  Seated thoracic openers 10x bil Seated lateral flexion 60 sec bil Seated forward fold 10 breaths  Therapeutic activities: Fiber education - different types of fiber, focusing on insoluble fiber when she is having difficulty with bowel movements Aquatic therapy intervention    01/21/23 Neuromuscular re-education: Supine UE ball press 2 x 10 Sidelying UE ball press 2 x 10 bil Pelvic tilts 2 x 10 Exercises: Seated hip IR 2 x 10 yellow loop, one leg at a time Seated hip IR 2 x 10 yellow look bil Seated hip abduction 2 x 10 yellow loop Seated hip adduction 2 x 10 ball squeeze    TREATMENT 01/07/23 RE-EVALUATION Manual: Bowel mobilization Abdominal scar tissue mobilization (Rt upper quadrant) Exercises: Open books 10x bil Bent knee fall out 10x  bil Wide knee lower trunk rotation 3 x 10 Therapeutic activities: Squatty potty Self-bowel massage Water intake Tea    PATIENT EDUCATION:  Education details:  aquatic therapy progressions / modifications  Person educated: Patient Education method: Explanation, Demonstration, Actor cues, Verbal cues, and Handouts Education comprehension: verbalized understanding and returned demonstration  HOME EXERCISE PROGRAM: [email or web address]  AQUATIC HEP Access Code: [email or web address] URL: [email or web address] Date: 03/17/2023    Prepared by: [facility] [address] [address] This aquatic home exercise program from [employer] utilizes pictures from land based exercises, but has been adapted prior to lamination and issuance.    ASSESSMENT:  CLINICAL IMPRESSION: Pt arrives in continued flare up of symptoms.  Exercise tolerance somewhat limited, so limited use of floatation to avoid irritating UE.  Pt issued laminated aquatic HEP and was instructed how to complete the exercises.  Notes written on HEP for clarification.     Pt would benefit from additional PT to further address deficits.  Pt returns to PT for reassessment prior to d/c.  OBJECTIVE IMPAIRMENTS: decreased coordination, decreased endurance, decreased mobility, decreased strength, increased fascial restrictions, increased muscle spasms, impaired flexibility, improper body mechanics, postural dysfunction, and pain.   ACTIVITY LIMITATIONS: carrying, lifting, sitting, squatting, continence, and locomotion level  PARTICIPATION LIMITATIONS: interpersonal relationship and community activity  PERSONAL FACTORS: Fitness, Time since onset of injury/illness/exacerbation, and 1 comorbidity: medical history  are also affecting patient's functional outcome.   REHAB POTENTIAL: Good  CLINICAL DECISION MAKING: Stable/uncomplicated  EVALUATION COMPLEXITY: Low   GOALS: Goals reviewed with patient? Yes  SHORT TERM GOALS: Target date:  10/27/22 - updated 11/25/22 - updated 01/07/23 with new target of 04/01/23  Pt to be I with HEP. Baseline: Goal status:MET 11/25/22  2.  Pt will report no  more than 4/10 pain due to improvements in posture, strength, and muscle length  Baseline:  Goal status: IN PROGRESS  3.  Pt to be I with voiding mechanics and breathing mechanics to decrease strain at pelvic floor and improve bowel and bladder habits.  Baseline:  Goal status:MET 11/25/22  4.  Pt to be I with abdominal massage for improved bowel habits.  Baseline: pt stopped performing - will start again Goal status: IN PROGRESS  LONG TERM GOALS: Target date: 12/30/21 updated 11/25/22 - updated 01/07/23 with new target of 04/01/23  Pt to be I with advanced HEP.  Baseline:  Goal status: IN PROGRESS  2.  Pt to demonstrate at least 5/5 bil hip strength for improved pelvic stability and functional squats without leakage.  Baseline:  Goal status: MET 01/07/23  3. Pt will report 2/10 pain due to improvements in posture, strength, and muscle length  Baseline:  Goal status:IN PROGRESS  4.  Pt will report type 3-4 on average for bowel movements based on stool chart for improved consistency and regularity.  Baseline:  Goal status: IN PROGRESS  5.  Pt to demonstrate minimal to no abdominal coning with core strengthening exercises for squatting for improved load transfer and decreased compensatory strategies.  Baseline:  Goal status: IN PROGRESS   PLAN:  PT FREQUENCY: 1x/week  PT DURATION:  8 sessions  PLANNED INTERVENTIONS: Therapeutic exercises, Therapeutic activity, Neuromuscular re-education, Patient/Family education, Self Care, Joint mobilization, Aquatic Therapy, Dry Needling, Spinal mobilization, Cryotherapy, Moist heat, scar mobilization, Taping, Biofeedback, and Manual therapy  PLAN FOR NEXT SESSION: Aquatic therapy intervention  Mayer Camel, PTA 03/26/23 1:11 PM [facility] [address] [place], [place], [address] Phone: [phone]   Fax:  [phone]

## 2023-03-30 ENCOUNTER — Encounter: Payer: Self-pay | Admitting: Family Medicine

## 2023-04-01 ENCOUNTER — Ambulatory Visit: Payer: BC Managed Care – PPO | Attending: Internal Medicine

## 2023-04-01 ENCOUNTER — Other Ambulatory Visit: Payer: Self-pay

## 2023-04-01 ENCOUNTER — Telehealth: Payer: Self-pay

## 2023-04-01 DIAGNOSIS — R279 Unspecified lack of coordination: Secondary | ICD-10-CM | POA: Insufficient documentation

## 2023-04-01 DIAGNOSIS — M62838 Other muscle spasm: Secondary | ICD-10-CM | POA: Diagnosis present

## 2023-04-01 DIAGNOSIS — R293 Abnormal posture: Secondary | ICD-10-CM | POA: Insufficient documentation

## 2023-04-01 DIAGNOSIS — Z1211 Encounter for screening for malignant neoplasm of colon: Secondary | ICD-10-CM

## 2023-04-01 DIAGNOSIS — M6281 Muscle weakness (generalized): Secondary | ICD-10-CM | POA: Insufficient documentation

## 2023-04-01 DIAGNOSIS — R1310 Dysphagia, unspecified: Secondary | ICD-10-CM

## 2023-04-01 DIAGNOSIS — R55 Syncope and collapse: Secondary | ICD-10-CM

## 2023-04-01 NOTE — Telephone Encounter (Signed)
Pt made aware of Dr. Lenward Chancellor recommendations: Pt was scheduled for an Colonoscopy/EGD on 05/07/2023 at 10:30 AM at [facility] with Dr. Leone Payor. Case ID number [id number]. Pt made aware Pt was scheduled for a previsit appointment on 04/14/2023 at 11:00 AM. Pt made aware. Special Prep added to appointment notes.  Cardiac Clearance was sent to pt cardiologist. Pt made aware.  Pt verbalized understanding with all questions answered.

## 2023-04-01 NOTE — Telephone Encounter (Signed)
[facility] Pre-operative Risk Assessment     Request for surgical clearance:     Endoscopy Procedure  What type of surgery is being performed?     Colonoscopy/ Upper endoscopy  When is this surgery scheduled?     05/07/2023  What type of clearance is required ?   Cardiac  Are there any medications that need to be held prior to surgery and how long? N/A  Practice name and name of physician performing surgery?      [facility]/ Dr. Leone Payor  What is your office phone and fax number?      Phone- [phone]  Fax- [phone]  Anesthesia type (None, local, MAC, general) ?       MAC

## 2023-04-01 NOTE — Telephone Encounter (Signed)
   Name: Emily Joseph  DOB: December 18, 1970  MRN: [id number]  Primary Cardiologist: Olga Millers, MD   Preoperative team, please contact this patient and set up a phone call appointment for further preoperative risk assessment. Please obtain consent and complete medication review. Thank you for your help.  I confirm that guidance regarding antiplatelet and oral anticoagulation therapy has been completed and, if necessary, noted below (none requested).    Joylene Grapes, NP 04/01/2023, 2:56 PM [facility]

## 2023-04-01 NOTE — Telephone Encounter (Signed)
Patient needs to be scheduled for an open hospital spot.  The reason we are scheduling and hospitals because she has problems with vasovagal reactions and a history of cardiac surgery    I recommend a double prep with MiraLAX (no Dulcolax) and Plenvu prep please communicate that with the scheduling and let the patient know that is what I am recommending when we call her.  Encounter Diagnoses  Name Primary?   Colon cancer screening Yes   Vasovagal reaction

## 2023-04-01 NOTE — Therapy (Signed)
OUTPATIENT PHYSICAL THERAPY FEMALE PELVIC TREATMENT   Patient Name: Emily Joseph MRN: [id number] DOB:[birth year], 52 y.o., female Today's Date: 04/01/2023  END OF SESSION:  PT End of Session - 04/01/23 1141     Visit Number 13    Date for PT Re-Evaluation --    Authorization Type [employer]    PT Start Time 1143    PT Stop Time 1210    PT Time Calculation (min) 27 min    Activity Tolerance Patient tolerated treatment well    Behavior During Therapy [facility] for tasks assessed/performed                 Past Medical History:  Diagnosis Date   Adenomyosis    not papanicolaou smear of cervix and cervical HPV   Allergy    Anxiety 06/08/2016   -about her health and about taking medications   Arthritis    ?   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 Nickola Major in 2017, [place] rheum -seeing Dr. Lucia Gaskins, Neurologist for back pain with radicular symptoms   Complication of anesthesia    "with epidural bp bottoms out"   Dysrhythmia    fast hr   Eosinophilic esophagitis    Sees Dr. Leone Payor   Fibromyalgia    GERD (gastroesophageal reflux disease)    H/O laminectomy 09/07/2020   Heart murmur    History of atrial flutter 10/2016   spontaneous conversion to sinus   History of gallstones    History of laparoscopic-assisted vaginal hysterectomy 09/07/2020   History of pneumonia    when pt was pregnant   History of pneumothorax 07/2016   Right   History of sinus tachycardia    Iron deficiency anemia due to chronic blood loss    Menorrhagia, s/p complete hysterectomy, ovaries remain hx   Irritable bowel syndrome 01/27/2013   Dr Leone Payor 02/2016    Microhematuria    Mouth problem    failed gum graft 1/16   Nasal airway abnormality    Nasal obstruction 06/26/2015   Seen by ENT  06-26-15.  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   Shortness of breath dyspnea    SUI (stress urinary incontinence, female)    Syncope    with last child with epideral   UTI (urinary tract infection) 07/14/2016   >=100,000 COLONIES/mL KLEBSIELLA PNEUMONIA   Vitamin D deficiency    Past Surgical History:  Procedure Laterality Date   APPENDECTOMY  01/1989   BALLOON DILATION N/A 02/09/2013   Procedure: BALLOON DILATION;  Surgeon: Willis Modena, MD;  Location: [facility];  Service: Endoscopy;  Laterality: N/A;   BILATERAL SALPINGECTOMY N/A 06/14/2014   Procedure: BILATERAL SALPINGECTOMY;  Surgeon: Leslie Andrea, MD;  Location: [facility];  Service: Gynecology;  Laterality: N/A;   BIOPSY  09/04/2021   Procedure: BIOPSY;  Surgeon: Iva Boop, MD;  Location: [facility];  Service: Endoscopy;;   BREAST BIOPSY Right    biopsy    CHOLECYSTECTOMY N/A 11/21/2019   Procedure: LAPAROSCOPIC CHOLECYSTECTOMY WITH INTRAOPERATIVE  CHOLANGIOGRAM;  Surgeon: Ovidio Kin, MD;  Location: [facility];  Service: General;  Laterality: N/A;   COLONOSCOPY WITH PROPOFOL N/A 02/09/2013   Procedure: COLONOSCOPY WITH PROPOFOL;  Surgeon: Willis Modena, MD;  Location: [facility];  Service: Endoscopy;  Laterality: N/A;  need ultra thin colon scope   ESOPHAGEAL DILATION  09/04/2021   Procedure: ESOPHAGEAL DILATION;  Surgeon: Iva Boop, MD;  Location: [facility];  Service: Endoscopy;;   ESOPHAGOGASTRODUODENOSCOPY Left 06/30/2021   Procedure: ESOPHAGOGASTRODUODENOSCOPY (EGD);  Surgeon: Shellia Cleverly, DO;  Location: [facility] ENDOSCOPY;  Service: Gastroenterology;  Laterality: Left;   ESOPHAGOGASTRODUODENOSCOPY (EGD) WITH PROPOFOL N/A 02/09/2013   Procedure: ESOPHAGOGASTRODUODENOSCOPY (EGD) WITH PROPOFOL;  Surgeon: Willis Modena, MD;  Location: [facility];  Service: Endoscopy;  Laterality: N/A;   ESOPHAGOGASTRODUODENOSCOPY (EGD) WITH PROPOFOL N/A 09/04/2021   Procedure: ESOPHAGOGASTRODUODENOSCOPY (EGD) WITH PROPOFOL;  Surgeon: Iva Boop, MD;  Location: [facility];  Service: Endoscopy;  Laterality: N/A;   FOREIGN BODY REMOVAL  06/30/2021   Procedure: FOREIGN BODY REMOVAL;  Surgeon: Shellia Cleverly, DO;  Location: [facility];  Service: Gastroenterology;;   LAPAROSCOPIC ASSISTED VAGINAL HYSTERECTOMY Right 06/14/2014   Procedure: OPEN LAPAROSCOPIC ASSISTED VAGINAL HYSTERECTOMY;  Surgeon: Leslie Andrea, MD;  Location: [facility];  Service: Gynecology;  Laterality: Right;   LYSIS OF ADHESION N/A 06/14/2014   Procedure: LYSIS OF ADHESION;  Surgeon: Leslie Andrea, MD;  Location: [facility];  Service: Gynecology;  Laterality: N/A;   MINIMALLY INVASIVE EXCISION OF ATRIAL MYXOMA Right 07/11/2016   Procedure: MINIMALLY INVASIVE RESECTION OF RIGHT ATRIAL LIPOMA WITH CLOSURE OF PATENT FORAMEN OVALE;  Surgeon: Purcell Nails, MD;  Location: [facility] OR;  Service: Open Heart Surgery;  Laterality: Right;   OVARIAN CYST REMOVAL Right 1990   TEE WITHOUT CARDIOVERSION N/A 07/11/2016   Procedure: TRANSESOPHAGEAL ECHOCARDIOGRAM (TEE);  Surgeon: Purcell Nails, MD;  [facility];  Service: Open Heart Surgery;  Laterality: N/A;   Patient Active Problem List   Diagnosis Date Noted   Pain in right hip 03/12/2022   Esophageal web    Female stress incontinence 09/07/2020   Heart disease 09/07/2020   Primary osteoarthritis of left knee 05/31/2020   At high risk for breast cancer 08/30/2019   Pelvic floor dysfunction 12/28/2018   Chronic RUQ pain - exacerbation 12/28/2018   Atrial flutter with rapid ventricular response (HCC) 10/27/2016   s/p minimally invasive resection of right atrial lipoma    Chronic pain 06/08/2016   Chronic fatigue 06/08/2016   GERD (gastroesophageal reflux disease) with esophagitis 06/08/2016   Palpitations 06/08/2016   Anxiety  06/08/2016   Multiple environmental allergies 03/04/2016   Multiple food allergies 03/04/2016   Irritable bowel syndrome with constipation 01/27/2013   Fibromyalgia 01/07/2007    PCP: Natalia Leatherwood, DO  REFERRING PROVIDER: Iva Boop, MD   REFERRING DIAG: K59.09 (ICD-10-CM) - Chronic constipation  Rationale for Evaluation and Treatment: Rehabilitation  THERAPY DIAG:  Muscle weakness (generalized)  Abnormal posture  Unspecified lack of coordination  ONSET DATE: chronic  SUBJECTIVE:  SUBJECTIVE STATEMENT: Pt states that she is still in a pain flare that has been going on for 3 weeks. She states that she has severe brain fog and nerve pain. She states that nerve pain is over her entire body and she describes it as contractions and like her muscles are being pulled taught like rubber bands. She reports significant frustration with pain worsening and everyone telling her she just isn't doing enough. She wants to be done with PT right now because she doesn't want another thing on her plate. She plans to do exercises in the pool this summer.   PERTINENT HISTORY:  Constipation, DRA, external hemorrhoid, rectocele, OA, fibromyalgia, prior Rt atrial lipoma and flutter, reports she has had Rt ovary cyst removed as a teenager, gallbladder removed 2021, LAVH 2014   Fluid intake: Yes: water - 80-100oz water per day, one cup coffee in morning.     PAIN:  Are you having pain? Yes NPRS scale: 5/10 (globally)  Pain location:  bil hip pain is usually the worst pain. Does have current Rt lipoma on hip.   Pain type: aching and tight Pain description: constant   Aggravating factors: lifting laundry, twisting, stress, driving Relieving factors: squatting helps with stretching  PRECAUTIONS: None  WEIGHT  BEARING RESTRICTIONS: No  FALLS:  Has patient fallen in last 6 months? 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   PLOF: Independent  PATIENT GOALS: to have less pain, more regular bowels, strengthening DRA   Sexual abuse: No  BOWEL MOVEMENT: Pain with bowel movement: Yes - with hemorrhoids  Type of bowel movement:Type (Bristol Stool Scale) 2-6, Frequency daily  to every two days, and Strain Yes ( a little)  Fully empty rectum: No not all the time Leakage: No Pads: No Fiber supplement: Yes: uses miralax or softener if needed but not daily   URINATION: Pain with urination: No Fully empty bladder: No not always Stream: Strong and Weak Urgency: Yes: sometimes with increased water intake Frequency: not more than every 2 hours Leakage: Coughing and Sneezing only strongly, very small amount  Pads: No  INTERCOURSE: Pain with intercourse: During Penetration and sometimes makes right hip pain worse Ability to have vaginal penetration:  Yes:   Climax: able to climax without pain  Marinoff Scale: 2/3 Menopause has started and does have a lot dryness   PREGNANCY: Vaginal deliveries 4 Tearing No  but episiotomy with first C-section deliveries 0 Currently pregnant No  PROLAPSE: None   OBJECTIVE:  01/07/23: -DRA: 1 finger width separation with distortion -Sit-up test: 0/3 - unable and distortion present with effort -Transversus abdominus: able to activate with cuing - when coached with breathing and chin-up, improved distortion -Tenderbess in Rt upper quadrant with scar tissue restriction -MMT hip strength: 5/5 grossly    DIAGNOSTIC FINDINGS:    COGNITION: Overall cognitive status: Within functional limits for tasks assessed     SENSATION: Light touch: Deficits fingers and feet tingling Proprioception: Appears intact  MUSCLE LENGTH: Bil hamstrings and  adductors limited blue bandy 25%     POSTURE: rounded shoulders, forward head, and posterior pelvic tilt   LUMBARAROM/PROM:  A/PROM A/PROM  eval  Flexion WFL  Extension WFL  Right lateral flexion Limited by 25%  Left lateral flexion Limited by 25%  Right rotation WFL  Left rotation WFL   (Blank rows = not tested)  LOWER EXTREMITY ROM:  WFL  LOWER  EXTREMITY MMT:  RT hips and adductors limited by 50% Lt hips and adductors limited by 25%  PALPATION:   General  TTP throughout Rt abdominal quadrants with noted fascial restrictions, Rt external obliques, Rt internal obliques, Rt psoas and with palpation in Rt abdomen pt reports she feels her Rt hip pain replicated                External Perineal Exam deferred until next visit                             Internal Pelvic Floor deferred until next visit  Patient confirms identification and approves PT to assess internal pelvic floor and treatment No  PELVIC MMT:   MMT eval  Vaginal   Internal Anal Sphincter   External Anal Sphincter   Puborectalis   Diastasis Recti Coning noted above umbilicus with active abdominal shoulder lift.    (Blank rows = not tested)        TONE: deferred until next visit  PROLAPSE: deferred until next visit  TODAY'S TREATMENT 04/01/23 Therapeutic activities: HEP review Chronic pain/pain neuroscience education Importance of exercise to tolerance    TREATMENT 01/28/23 Neuromuscular re-education: Seated chop 3lb 10x bil Seated chop with ball press 10x bil Seated march with UE ball press 2 x 10 Exercises: Seated hip IR 2 x 10 yellow loop, one leg at a time Seated hip abduction 2 x 10 yellow loop Seated hip adduction 2 x 10 ball squeeze  Seated thoracic openers 10x bil Seated lateral flexion 60 sec bil Seated forward fold 10 breaths  Therapeutic activities: Fiber education - different types of fiber, focusing on insoluble fiber when she is having difficulty with bowel  movements Aquatic therapy intervention    TREATMENT 01/21/23 Neuromuscular re-education: Supine UE ball press 2 x 10 Sidelying UE ball press 2 x 10 bil Pelvic tilts 2 x 10 Exercises: Seated hip IR 2 x 10 yellow loop, one leg at a time Seated hip IR 2 x 10 yellow look bil Seated hip abduction 2 x 10 yellow loop Seated hip adduction 2 x 10 ball squeeze   PATIENT EDUCATION:  Education details: [id number], abdominal massage, moisturizers Person educated: Patient Education method: Explanation, Demonstration, Tactile cues, Verbal cues, and Handouts Education comprehension: verbalized understanding and returned demonstration  HOME EXERCISE PROGRAM: [id number]  ASSESSMENT:  CLINICAL IMPRESSION: Pt has been in pain flare up for multiple weeks now and is very frustrated. She feels like she used to have a lower baseline pain, but now baseline is higher and she is having significant difficulty functionally with pain management. We reviewed concepts of chronic pain, depression impact on pain, benefit of exercise, and review of HEP. She would like to D/C today and work on figuring out what is going on with whole body nerve pain. She will plan to return at a later date when she feels like PT will be more helpful. Due to pt request, we will D/C at this time.   OBJECTIVE IMPAIRMENTS: decreased coordination, decreased endurance, decreased mobility, decreased strength, increased fascial restrictions, increased muscle spasms, impaired flexibility, improper body mechanics, postural dysfunction, and pain.   ACTIVITY LIMITATIONS: carrying, lifting, sitting, squatting, continence, and locomotion level  PARTICIPATION LIMITATIONS: interpersonal relationship and community activity  PERSONAL FACTORS: Fitness, Time since onset of injury/illness/exacerbation, and 1 comorbidity: medical history  are also affecting patient's functional outcome.   REHAB POTENTIAL: Good  CLINICAL DECISION MAKING:  Stable/uncomplicated  EVALUATION  COMPLEXITY: Low   GOALS: Goals reviewed with patient? Yes  SHORT TERM GOALS: Target date: 10/27/22 - updated 11/25/22 - updated 01/07/23 with new target of 04/01/23 - updated 04/01/23  Pt to be I with HEP. Baseline: Goal status:MET 11/25/22  2.  Pt will report no  more than 4/10 pain due to improvements in posture, strength, and muscle length  Baseline:  Goal status: DISCHARGED 04/01/23   3.  Pt to be I with voiding mechanics and breathing mechanics to decrease strain at pelvic floor and improve bowel and bladder habits.  Baseline:  Goal status:MET 11/25/22  4.  Pt to be I with abdominal massage for improved bowel habits.  Baseline: pt stopped performing  Goal status: DISCHARGED   LONG TERM GOALS: Target date: 12/30/21 updated 11/25/22 - updated 01/07/23 with new target of 04/01/23 - updated 04/01/23  Pt to be I with advanced HEP.  Baseline:  Goal status: DISCHARGED  2.  Pt to demonstrate at least 5/5 bil hip strength for improved pelvic stability and functional squats without leakage.  Baseline:  Goal status: MET 01/07/23  3. Pt will report 2/10 pain due to improvements in posture, strength, and muscle length  Baseline:  Goal status: DISCHARGED  4.  Pt will report type 3-4 on average for bowel movements based on stool chart for improved consistency and regularity.  Baseline:  Goal status: DISCHARGED   5.  Pt to demonstrate minimal to no abdominal coning with core strengthening exercises for squatting for improved load transfer and decreased compensatory strategies.  Baseline:  Goal status: DISCHARGED   PLAN:  PT FREQUENCY: -  PT DURATION: -  PLANNED INTERVENTIONS: -  PLAN FOR NEXT SESSION: -  PHYSICAL THERAPY DISCHARGE SUMMARY  Visits from Start of Care: 13  Current functional level related to goals / functional outcomes: Not independent   Remaining deficits: See above   Education / Equipment: HEP   Patient agrees to  discharge. Patient goals were not met. Patient is being discharged due to lack of progress.  Julio Alm, PT, DPT05/22/2412:13 PM

## 2023-04-01 NOTE — Telephone Encounter (Signed)
Pt was made aware of Dr. Leone Payor recommendations: Pt is questioning if she needs to have an EGD as well. Pt stated that in the last couple of weeks that she has been having problems swallowing. Especially Bread. Last EGD 09/04/2021 Please review and advise

## 2023-04-01 NOTE — Telephone Encounter (Signed)
Pt was scheduled 04/13/23 at 10am for a tele appt.     Patient Consent for Virtual Visit        Emily Joseph has provided verbal consent on 04/01/2023 for a virtual visit (video or telephone).   CONSENT FOR VIRTUAL VISIT FOR:  Emily Joseph  By participating in this virtual visit I agree to the following:  I hereby voluntarily request, consent and authorize [facility] and its employed or contracted physicians, physician assistants, nurse practitioners or other licensed health care professionals (the Practitioner), to provide me with telemedicine health care services (the "Services") as deemed necessary by the treating Practitioner. I acknowledge and consent to receive the Services by the Practitioner via telemedicine. I understand that the telemedicine visit will involve communicating with the Practitioner through live audiovisual communication technology and the disclosure of certain medical information by electronic transmission. I acknowledge that I have been given the opportunity to request an in-person assessment or other available alternative prior to the telemedicine visit and am voluntarily participating in the telemedicine visit.  I understand that I have the right to withhold or withdraw my consent to the use of telemedicine in the course of my care at any time, without affecting my right to future care or treatment, and that the Practitioner or I may terminate the telemedicine visit at any time. I understand that I have the right to inspect all information obtained and/or recorded in the course of the telemedicine visit and may receive copies of available information for a reasonable fee.  I understand that some of the potential risks of receiving the Services via telemedicine include:  Delay or interruption in medical evaluation due to technological equipment failure or disruption; Information transmitted may not be sufficient (e.g. poor resolution of images) to allow for  appropriate medical decision making by the Practitioner; and/or  In rare instances, security protocols could fail, causing a breach of personal health information.  Furthermore, I acknowledge that it is my responsibility to provide information about my medical history, conditions and care that is complete and accurate to the best of my ability. I acknowledge that Practitioner's advice, recommendations, and/or decision may be based on factors not within their control, such as incomplete or inaccurate data provided by me or distortions of diagnostic images or specimens that may result from electronic transmissions. I understand that the practice of medicine is not an exact science and that Practitioner makes no warranties or guarantees regarding treatment outcomes. I acknowledge that a copy of this consent can be made available to me via my patient portal [facility] MyChart), or I can request a printed copy by calling the office of [facility].    I understand that my insurance will be billed for this visit.   I have read or had this consent read to me. I understand the contents of this consent, which adequately explains the benefits and risks of the Services being provided via telemedicine.  I have been provided ample opportunity to ask questions regarding this consent and the Services and have had my questions answered to my satisfaction. I give my informed consent for the services to be provided through the use of telemedicine in my medical care

## 2023-04-02 NOTE — Telephone Encounter (Signed)
Patient called to schedule appt with Dr. Claiborne Billings.  Patient scheduled on 04/13/23 at 11AM

## 2023-04-13 ENCOUNTER — Ambulatory Visit: Payer: BC Managed Care – PPO | Attending: Cardiology | Admitting: Student

## 2023-04-13 ENCOUNTER — Other Ambulatory Visit: Payer: Self-pay | Admitting: Family Medicine

## 2023-04-13 ENCOUNTER — Ambulatory Visit (INDEPENDENT_AMBULATORY_CARE_PROVIDER_SITE_OTHER): Payer: BC Managed Care – PPO | Admitting: Family Medicine

## 2023-04-13 ENCOUNTER — Encounter: Payer: Self-pay | Admitting: Family Medicine

## 2023-04-13 VITALS — BP 130/77 | HR 87 | Temp 97.3°F | Wt 174.8 lb

## 2023-04-13 DIAGNOSIS — R5382 Chronic fatigue, unspecified: Secondary | ICD-10-CM

## 2023-04-13 DIAGNOSIS — Z01810 Encounter for preprocedural cardiovascular examination: Secondary | ICD-10-CM | POA: Diagnosis not present

## 2023-04-13 DIAGNOSIS — M797 Fibromyalgia: Secondary | ICD-10-CM | POA: Diagnosis not present

## 2023-04-13 DIAGNOSIS — F419 Anxiety disorder, unspecified: Secondary | ICD-10-CM

## 2023-04-13 MED ORDER — DULOXETINE HCL 20 MG PO CPEP: ORAL_CAPSULE | ORAL | 1 refills | Status: DC

## 2023-04-13 NOTE — Progress Notes (Signed)
Virtual Visit via Telephone Note   Because of Emily Joseph's co-morbid illnesses, she is at least at moderate risk for complications without adequate follow up.  This format is felt to be most appropriate for this patient at this time.  The patient did not have access to video technology/had technical difficulties with video requiring transitioning to audio format only (telephone).  All issues noted in this document were discussed and addressed.  No physical exam could be performed with this format.  Please refer to the patient's chart for her consent to telehealth for [facility].  Evaluation Performed:  Preoperative cardiovascular risk assessment _____________   Date:  04/13/2023   Patient ID:  Emily Joseph, DOB 06-05-1971, MRN [id number] Patient Location:  Home Provider location:   Office  Primary Care Provider:  Natalia Leatherwood, DO Primary Cardiologist:  Olga Millers, MD  Chief Complaint / Patient Profile   52 y.o. y/o female with a h/o atrial lipoma s/p resection, patent foramen ovale s/p repair, episode of a-flutter not on anticoagulation, palpitations who is pending upper endoscopy/colonoscopy by Dr. Leone Payor and presents today for telephonic preoperative cardiovascular risk assessment.  History of Present Illness    Emily Joseph is a 52 y.o. female who presents via audio/video conferencing for a telehealth visit today.  Pt was last seen in cardiology clinic on 01/26/2023 by Dr. Jens Som.  At that time Emily Joseph was doing well from a cardiac standpoint.  The patient is now pending procedure as outlined above. Since her last visit, she is doing well from a cardiac standpoint. Patient denies shortness of breath or dyspnea on exertion. No chest pain, pressure, or tightness. Denies lower extremity edema, orthopnea, or PND. No palpitations. She has a lot of rheumatologic issues that limit her active but she does get 4000 steps in each day. She also performs yard work and  light to moderate housework.   Past Medical History    Past Medical History:  Diagnosis Date   Adenomyosis    not papanicolaou smear of cervix and cervical HPV   Allergy    Anxiety 06/08/2016   -about her health and about taking medications   Arthritis    ?   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 Nickola Major in 2017, [place] rheum -seeing Dr. Lucia Gaskins, Neurologist for back pain with radicular symptoms   Complication of anesthesia    "with epidural bp bottoms out"   Dysrhythmia    fast hr   Eosinophilic esophagitis    Sees Dr. Leone Payor   Fibromyalgia    GERD (gastroesophageal reflux disease)    H/O laminectomy 09/07/2020   Heart murmur    History of atrial flutter 10/2016   spontaneous conversion to sinus   History of gallstones    History of laparoscopic-assisted vaginal hysterectomy 09/07/2020   History of pneumonia    when pt was pregnant   History of pneumothorax 07/2016   Right   History of sinus tachycardia    Iron deficiency anemia due to chronic blood loss    Menorrhagia, s/p complete hysterectomy, ovaries remain hx   Irritable bowel syndrome 01/27/2013   Dr Leone Payor 02/2016    Microhematuria    Mouth problem    failed gum  graft 1/16   Nasal airway abnormality    Nasal obstruction 06/26/2015   Seen by ENT 06-26-15.  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   Shortness of breath dyspnea    SUI (stress urinary incontinence, female)    Syncope    with last child with epideral   UTI (urinary tract infection) 07/14/2016   >=100,000 COLONIES/mL KLEBSIELLA PNEUMONIA   Vitamin D deficiency     Past Surgical History:  Procedure Laterality Date   APPENDECTOMY  01/1989   BALLOON DILATION N/A 02/09/2013   Procedure: BALLOON DILATION;  Surgeon: Willis Modena, MD;  Location: [facility];  Service: Endoscopy;  Laterality: N/A;   BILATERAL SALPINGECTOMY N/A 06/14/2014   Procedure: BILATERAL SALPINGECTOMY;  Surgeon: Leslie Andrea, MD;  Location: [facility];  Service: Gynecology;  Laterality: N/A;   BIOPSY  09/04/2021   Procedure: BIOPSY;  Surgeon: Iva Boop, MD;  Location: [facility];  Service: Endoscopy;;   BREAST BIOPSY Right    biopsy    CHOLECYSTECTOMY N/A 11/21/2019   Procedure: LAPAROSCOPIC CHOLECYSTECTOMY WITH INTRAOPERATIVE CHOLANGIOGRAM;  Surgeon: Ovidio Kin, MD;  Location: [facility];  Service: General;  Laterality: N/A;   COLONOSCOPY WITH PROPOFOL N/A 02/09/2013   Procedure: COLONOSCOPY WITH PROPOFOL;  Surgeon: Willis Modena, MD;  Location: [facility];  Service: Endoscopy;  Laterality: N/A;  need ultra thin colon scope   ESOPHAGEAL DILATION  09/04/2021   Procedure: ESOPHAGEAL DILATION;  Surgeon: Iva Boop, MD;  Location: [facility];  Service: Endoscopy;;   ESOPHAGOGASTRODUODENOSCOPY Left 06/30/2021   Procedure: ESOPHAGOGASTRODUODENOSCOPY (EGD);  Surgeon: Shellia Cleverly, DO;  Location: [facility];  Service: Gastroenterology;  Laterality: Left;   ESOPHAGOGASTRODUODENOSCOPY (EGD) WITH PROPOFOL N/A 02/09/2013   Procedure: ESOPHAGOGASTRODUODENOSCOPY (EGD) WITH PROPOFOL;  Surgeon: Willis Modena, MD;  Location: [facility];  Service: Endoscopy;  Laterality: N/A;   ESOPHAGOGASTRODUODENOSCOPY (EGD) WITH PROPOFOL N/A 09/04/2021   Procedure: ESOPHAGOGASTRODUODENOSCOPY (EGD) WITH PROPOFOL;  Surgeon: Iva Boop, MD;  Location: [facility];  Service: Endoscopy;  Laterality: N/A;   FOREIGN BODY REMOVAL  06/30/2021   Procedure: FOREIGN BODY REMOVAL;  Surgeon: Shellia Cleverly, DO;  Location: [facility];  Service: Gastroenterology;;   LAPAROSCOPIC ASSISTED VAGINAL  HYSTERECTOMY Right 06/14/2014   Procedure: OPEN LAPAROSCOPIC ASSISTED VAGINAL HYSTERECTOMY;  Surgeon: Leslie Andrea, MD;  Location: [facility];  Service: Gynecology;  Laterality: Right;   LYSIS OF ADHESION N/A 06/14/2014   Procedure: LYSIS OF ADHESION;  Surgeon: Leslie Andrea, MD;  Location: [facility];  Service: Gynecology;  Laterality: N/A;   MINIMALLY INVASIVE EXCISION OF ATRIAL MYXOMA Right 07/11/2016   Procedure: MINIMALLY INVASIVE RESECTION OF RIGHT ATRIAL LIPOMA WITH CLOSURE OF PATENT FORAMEN OVALE;  Surgeon: Purcell Nails, MD;  Location: [facility];  Service: Open Heart Surgery;  Laterality: Right;   OVARIAN CYST REMOVAL Right 1990   TEE WITHOUT CARDIOVERSION N/A 07/11/2016   Procedure: TRANSESOPHAGEAL ECHOCARDIOGRAM (TEE);  Surgeon: Purcell Nails, MD;  Location: [facility] OR;  Service: Open Heart Surgery;  Laterality: N/A;    Allergies  Allergies  Allergen Reactions   Avocado Shortness Of Breath and Nausea And Vomiting   Macadamia Nut Oil Hives and Swelling    & Walnuts. LIPS SWELL    Mango Flavor Swelling  SWELLING OF THE LIPS   Other Other (See Comments)    [name]   Ciprofloxacin Other (See Comments)    SEVERE JOINT PAIN   Demerol [Meperidine] Other (See Comments)    HALLUCINATIONS    Iodinated Contrast Media Hives and Other (See Comments)    Patient had IVP @ age 23 and broke out in Hives (was once pre-treated with several meds)   Macrobid [Nitrofurantoin] Other (See Comments)    CHEST PAIN   Betadine [Povidone Iodine]     hives   Latex Swelling    Please use paper tape or nitrile gloves   Dulcolax [Bisacodyl] Palpitations    Felt light headed and dizzy   Sulfamethoxazole Nausea Only, Other (See Comments) and Nausea And Vomiting    Childhood reaction - upset stomach   Tape Other (See Comments) and Rash    Family allergy; this is to be avoided; tolerates paper tape     Home Medications    Prior to Admission medications   Medication Sig Start Date End Date Taking? Authorizing Provider  albuterol (VENTOLIN HFA) 108 (90 Base) MCG/ACT inhaler Inhale 2 puffs into the lungs as needed for wheezing or shortness of breath. 10/08/20   [provider]  Azelastine-Fluticasone 137-50 MCG/ACT SUSP Place 1 spray into both nostrils 2 (two) times daily. 10/08/21   [provider]  Bromelains (BROMELAIN PO) Take 1 tablet by mouth. Three-four times weekly    [provider]  cetirizine (ZYRTEC) 10 MG tablet Take 10 mg by mouth daily.    [provider]  Cholecalciferol (VITAMIN D3) 125 MCG (5000 UT) TABS Take 5,000 Units by mouth daily.    [provider]  EPINEPHrine 0.3 mg/0.3 mL IJ SOAJ injection Inject 0.3 mg into the muscle as needed for anaphylaxis.    [provider]  fexofenadine (ALLEGRA ODT) 30 MG disintegrating tablet Take 30 mg by mouth daily.    [provider]  Glycerin-Hypromellose-PEG 400 (DRY EYE RELIEF DROPS) 0.2-0.2-1 % SOLN Place 1 drop into both eyes daily.    [provider]  ketotifen (ZADITOR) 0.025 % ophthalmic solution 1 drop 2 (two) times daily.    [provider]  l-methylfolate-B6-B12 (METANX) 3-35-2 MG TABS tablet Take 1 tablet by mouth daily.    [provider]  MAGNESIUM PO Take by mouth.    [provider]  Menaquinone-7 (K2 PO) Take 100 mcg by mouth.    [provider]  Methylcobalamin (METHYL B-12 PO) Take by mouth as needed.    [provider]  metoprolol tartrate (LOPRESSOR) 25 MG tablet Take 0.5 tablets (12.5 mg total) by mouth 2 (two) times daily. 10/14/21   Lewayne Bunting, MD  Polyethyl Glycol-Propyl Glycol (SYSTANE OP) Place 1 drop into both eyes 2 (two) times daily.    [provider]  polyethylene glycol (MIRALAX / GLYCOLAX) 17 g packet Take 17 g by mouth as needed.    [provider]  Wheat  Dextrin (BENEFIBER) CHEW Chew 1-2 tablets by mouth at bedtime.    [provider]    Physical Exam    Vital Signs:  Emily Joseph does not have vital signs available for review today.  Given telephonic nature of communication, physical exam is limited. AAOx3. NAD. Normal affect.  Speech and respirations are unlabored.  Accessory Clinical Findings    None  Assessment & Plan  Primary Cardiologist: Olga Millers, MD  Preoperative cardiovascular risk assessment.  Upper endoscopy/colonoscopy by Dr. Leone Payor.  Chart reviewed as part of pre-operative protocol coverage. According to the RCRI, patient has a 0.4% risk of MACE. Patient reports activity equivalent to 6.27 METS (per [facility]).   Given past medical history and time since last visit, based on ACC/AHA guidelines, Emily Joseph would be at acceptable risk for the planned procedure without further cardiovascular testing.   Patient was advised that if she develops new symptoms prior to surgery to contact our office to arrange a follow-up appointment.  she verbalized understanding.  I will route this recommendation to the requesting party via Epic fax function.  Please call with questions.  Time:   Today, I have spent 8 minutes with the patient with telehealth technology discussing medical history, symptoms, and management plan.     Carlos Levering, NP  04/13/2023, 8:20 AM

## 2023-04-13 NOTE — Patient Instructions (Signed)
Return in about 4 weeks (around 05/11/2023).        Great to see you today.  I have refilled the medication(s) we provide.   If labs were collected, we will inform you of lab results once received either by echart message or telephone call.   - echart message- for normal results that have been seen by the patient already.   - telephone call: abnormal results or if patient has not viewed results in their echart.
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
## 2023-04-14 ENCOUNTER — Ambulatory Visit (AMBULATORY_SURGERY_CENTER): Payer: BC Managed Care – PPO | Admitting: *Deleted

## 2023-04-14 VITALS — Ht 67.5 in | Wt 173.0 lb

## 2023-04-14 DIAGNOSIS — Z1211 Encounter for screening for malignant neoplasm of colon: Secondary | ICD-10-CM

## 2023-04-14 MED ORDER — PEG 3350-KCL-NA BICARB-NACL 420 G PO SOLR
4000.0000 mL | Freq: Once | ORAL | 0 refills | Status: AC
Start: 2023-04-14 — End: 2023-04-14

## 2023-04-14 NOTE — Progress Notes (Signed)
Pt's name and DOB verified at the beginning of the pre-visit.  Pt denies any difficulty with ambulating,sitting, laying down or rolling side to side Gave both [facility] main # and MD on call # prior to instructions.  No egg or soy allergy known to patient  No issues known to pt with past sedation with any surgeries or procedures Pt denies having issues being intubated Patient denies ever being intubatedPt has no issues moving head neck or swallowing No FH of Malignant Hyperthermia Pt is not on diet pills Pt is not on home 02  Pt is not on blood thinners  Pt denies issues with constipation  Pt has frequent issues with constipation RN instructed pt to use Miralax per bottles instructions a week before prep days. Pt states they will Pt is not on dialysis Pt denise any abnormal heart rhythms  Pt denies any upcoming cardiac testing Pt encouraged to use to use Singlecare or Goodrx to reduce cost  Patient's chart reviewed by Cathlyn Parsons CNRA prior to pre-visit and patient appropriate for the [facility].  Pre-visit completed and red dot placed by patient's name on their procedure day (on provider's schedule).  . Visit by phone Pt states weight is 173 lb After dissuasion with pt about her vagel issues  as well as other health issues mainly  her concerns about starting so late with the prep decision to change to a 2 day [place] was made.  Instructions given with pt stating she fully understood them. She is to start Miralax daily until day of procedure for her constipation issues. Instructed pt why it is important to and  to call if they have any changes in health or new medications. Directed them to the # given and on instructions.   Pt states they will.  Instructions reviewed with pt and pt states understanding. Instructed to review again prior to procedure. Pt states they will.  Instructions given to pt and sent to my chart

## 2023-04-14 NOTE — Telephone Encounter (Signed)
Cardiac clearance received. Refer to cardiology note 04/13/23.

## 2023-04-15 MED ORDER — DULOXETINE HCL 20 MG PO CPEP: ORAL_CAPSULE | ORAL | 1 refills | Status: DC

## 2023-04-15 NOTE — Telephone Encounter (Signed)
Please inform patient we had to send her prescription to a different pharmacy to have it compounded to the Cymbalta 10 mg dosage.  [facility] of [address]

## 2023-04-21 ENCOUNTER — Encounter: Payer: Self-pay | Admitting: Internal Medicine

## 2023-04-24 ENCOUNTER — Encounter: Payer: Self-pay | Admitting: Internal Medicine

## 2023-04-24 ENCOUNTER — Telehealth: Payer: Self-pay | Admitting: Internal Medicine

## 2023-04-24 NOTE — Telephone Encounter (Signed)
Patient called in with complaints of chronic RLQ pain, nausea, bloating, and constipation. Holding down fluids/food. States her abdomen is distended & feels pressure like "a tire is wrapped around her." She was previously taking miralax 4 times/week which was providing relief, but now she feels that it is "just swelling her up." She's tried fiber gummies which didn't help, and discontinued those about a month ago. She has had two small, pale/chalky bowel movements today & still does not feel relief. She has tried both glycerine & bisacodyl suppository. She is currently scheduled for endo colon at [facility] on 05/07/23 and would like to know Dr. Teresita Madura recommendations prior to then. She's afraid she will not be able to complete prep if this issue continues.

## 2023-04-24 NOTE — Telephone Encounter (Signed)
I recommend she try a MiraLAX purge which is 4 Dulcolax wait an hour and then drink 4-6 doses of MiraLAX over about 1 to 2 hours.  She could try a Fleet enema also.  Asked her to update [place] at the beginning of the week and to call back sooner as needed.

## 2023-04-24 NOTE — Telephone Encounter (Signed)
PT is requesting a call back to discuss her severe constipation and nausea. Please advise

## 2023-04-27 NOTE — Telephone Encounter (Signed)
See 04/24/23 patient message for update regarding follow up.

## 2023-04-28 ENCOUNTER — Encounter (HOSPITAL_COMMUNITY): Payer: Self-pay | Admitting: Internal Medicine

## 2023-04-28 ENCOUNTER — Other Ambulatory Visit: Payer: Self-pay

## 2023-04-28 NOTE — Progress Notes (Signed)
COVID Vaccine Completed: no  Date of COVID positive in last 90 days: no  PCP - Felix Pacini, DO Cardiologist - Olga Millers, MD LOC 01/26/23  Cardiac clearance by Carlos Levering, NP 04/13/23 in Epic   Chest x-ray - n/a EKG - 05/14/22 Epic Stress Test - n/a ECHO - 02/12/23 Epic Cardiac Cath - n/a Pacemaker/ICD device last checked: n/a Spinal Cord Stimulator: n/a  Bowel Prep - has bowel prep instructions  Sleep Study - n/a CPAP -   Fasting Blood Sugar - PreDM, needed insulin during previous procedures  Checks Blood Sugar no checks at home  Last dose of GLP1 agonist-  N/A GLP1 instructions:  N/A   Last dose of SGLT-2 inhibitors-  N/A SGLT-2 instructions: N/A   Blood Thinner Instructions: n/a Aspirin Instructions: Last Dose:  Activity level: Can go up a flight of stairs and perform activities of daily living without stopping and without symptoms of chest pain or shortness of breath.   Anesthesia review: atrial flutter, heart disease, syncope, SOB, heart murmur, heart resection and graft, nasal obstruction, atrial lipoma   Patient denies shortness of breath, fever, cough and chest pain   Patient verbalized understanding of instructions that were given to them.

## 2023-04-29 ENCOUNTER — Encounter (HOSPITAL_COMMUNITY): Payer: Self-pay | Admitting: *Deleted

## 2023-04-29 ENCOUNTER — Emergency Department (HOSPITAL_COMMUNITY)
Admission: EM | Admit: 2023-04-29 | Discharge: 2023-04-30 | Disposition: A | Discharge status: Home / Self Care | Payer: BC Managed Care – PPO | Attending: Emergency Medicine | Admitting: Emergency Medicine

## 2023-04-29 ENCOUNTER — Other Ambulatory Visit: Payer: Self-pay

## 2023-04-29 ENCOUNTER — Telehealth: Payer: Self-pay

## 2023-04-29 ENCOUNTER — Encounter: Payer: Self-pay | Admitting: Cardiology

## 2023-04-29 ENCOUNTER — Emergency Department (HOSPITAL_COMMUNITY): Payer: BC Managed Care – PPO

## 2023-04-29 ENCOUNTER — Encounter: Payer: Self-pay | Admitting: Internal Medicine

## 2023-04-29 DIAGNOSIS — K5900 Constipation, unspecified: Secondary | ICD-10-CM | POA: Insufficient documentation

## 2023-04-29 DIAGNOSIS — R310 Gross hematuria: Secondary | ICD-10-CM | POA: Diagnosis not present

## 2023-04-29 DIAGNOSIS — Z91040 Latex allergy status: Secondary | ICD-10-CM | POA: Insufficient documentation

## 2023-04-29 DIAGNOSIS — R0789 Other chest pain: Secondary | ICD-10-CM | POA: Insufficient documentation

## 2023-04-29 DIAGNOSIS — R1084 Generalized abdominal pain: Secondary | ICD-10-CM

## 2023-04-29 DIAGNOSIS — R109 Unspecified abdominal pain: Secondary | ICD-10-CM | POA: Diagnosis present

## 2023-04-29 LAB — URINALYSIS, ROUTINE W REFLEX MICROSCOPIC
Bacteria, UA: NONE SEEN
Bilirubin Urine: NEGATIVE
Glucose, UA: NEGATIVE mg/dL
Ketones, ur: NEGATIVE mg/dL
Leukocytes,Ua: NEGATIVE
Nitrite: NEGATIVE
Protein, ur: NEGATIVE mg/dL
Specific Gravity, Urine: 1.004 — ABNORMAL LOW (ref 1.005–1.030)
pH: 7 (ref 5.0–8.0)

## 2023-04-29 LAB — I-STAT BETA HCG BLOOD, ED (MC, WL, AP ONLY): I-stat hCG, quantitative: <5 m[IU]/mL (ref <5)

## 2023-04-29 LAB — COMPREHENSIVE METABOLIC PANEL
ALT: 29 U/L (ref 0–44)
AST: 20 U/L (ref 15–41)
Albumin: 4.1 g/dL (ref 3.5–5.0)
Alkaline Phosphatase: 95 U/L (ref 38–126)
Anion gap: 14 (ref 5–15)
BUN: 14 mg/dL (ref 6–20)
CO2: 23 mmol/L (ref 22–32)
Calcium: 9.6 mg/dL (ref 8.9–10.3)
Chloride: 101 mmol/L (ref 98–111)
Creatinine, Ser: 0.66 mg/dL (ref 0.44–1.00)
GFR, Estimated: >60 mL/min (ref >60)
Glucose, Bld: 118 mg/dL — ABNORMAL HIGH (ref 70–99)
Potassium: 3.7 mmol/L (ref 3.5–5.1)
Sodium: 138 mmol/L (ref 135–145)
Total Bilirubin: 0.5 mg/dL (ref 0.3–1.2)
Total Protein: 7.3 g/dL (ref 6.5–8.1)

## 2023-04-29 LAB — CBC WITH DIFFERENTIAL/PLATELET
Abs Immature Granulocytes: 0.06 10*3/uL (ref 0.00–0.07)
Basophils Absolute: 0.1 10*3/uL (ref 0.0–0.1)
Basophils Relative: 1 %
Eosinophils Absolute: 0.2 10*3/uL (ref 0.0–0.5)
Eosinophils Relative: 1 %
HCT: 41.6 % (ref 36.0–46.0)
Hemoglobin: 14 g/dL (ref 12.0–15.0)
Immature Granulocytes: 1 %
Lymphocytes Relative: 18 %
Lymphs Abs: 2 10*3/uL (ref 0.7–4.0)
MCH: 29.9 pg (ref 26.0–34.0)
MCHC: 33.7 g/dL (ref 30.0–36.0)
MCV: 88.7 fL (ref 80.0–100.0)
Monocytes Absolute: 0.6 10*3/uL (ref 0.1–1.0)
Monocytes Relative: 6 %
Neutro Abs: 8.5 10*3/uL — ABNORMAL HIGH (ref 1.7–7.7)
Neutrophils Relative %: 73 %
Platelets: 315 10*3/uL (ref 150–400)
RBC: 4.69 MIL/uL (ref 3.87–5.11)
RDW: 12.4 % (ref 11.5–15.5)
WBC: 11.5 10*3/uL — ABNORMAL HIGH (ref 4.0–10.5)
nRBC: 0 % (ref 0.0–0.2)

## 2023-04-29 LAB — I-STAT CHEM 8, ED
BUN: 14 mg/dL (ref 6–20)
Calcium, Ion: 1.19 mmol/L (ref 1.15–1.40)
Chloride: 103 mmol/L (ref 98–111)
Creatinine, Ser: 0.7 mg/dL (ref 0.44–1.00)
Glucose, Bld: 123 mg/dL — ABNORMAL HIGH (ref 70–99)
HCT: 41 % (ref 36.0–46.0)
Hemoglobin: 13.9 g/dL (ref 12.0–15.0)
Potassium: 3.9 mmol/L (ref 3.5–5.1)
Sodium: 140 mmol/L (ref 135–145)
TCO2: 27 mmol/L (ref 22–32)

## 2023-04-29 LAB — TSH: TSH: 0.764 u[IU]/mL (ref 0.350–4.500)

## 2023-04-29 LAB — LIPASE, BLOOD: Lipase: 29 U/L (ref 11–51)

## 2023-04-29 LAB — TROPONIN I (HIGH SENSITIVITY)
Troponin I (High Sensitivity): 5 ng/L (ref <18)
Troponin I (High Sensitivity): 3 ng/L (ref <18)

## 2023-04-29 MED ORDER — KETOROLAC TROMETHAMINE 15 MG/ML IJ SOLN
15.0000 mg | Freq: Once | INTRAMUSCULAR | Status: AC
  Administered 2023-04-29: 15 mg via INTRAVENOUS
  Filled 2023-04-29: qty 1

## 2023-04-29 MED ORDER — ONDANSETRON HCL 4 MG/2ML IJ SOLN: 4.0000 mg | Freq: Once | INTRAMUSCULAR | Status: DC

## 2023-04-29 MED ORDER — SODIUM CHLORIDE 0.9 % IV BOLUS
1000.0000 mL | Freq: Once | INTRAVENOUS | Status: AC
  Administered 2023-04-29: 1000 mL via INTRAVENOUS

## 2023-04-29 NOTE — Discharge Instructions (Addendum)
You have been evaluated for your symptoms.  Fortunately no concerning findings were noted on today's exam.  Take MiraLAX daily to help aid with good bowel movement and help with constipation.  You have blood in your urine, follow-up with your urologist for further evaluation.  Follow-up with your doctor for the care.

## 2023-04-29 NOTE — ED Provider Notes (Signed)
[place] [facility] AT [facility] Provider Note   CSN: [id number] Arrival date & time: 04/29/23  1514     History  Chief Complaint  Patient presents with   Chest Pain   Abdominal Pain    ADELAYDA CRITZ is a 52 y.o. female.  The history is provided by the patient and medical records. No language interpreter was used.  Chest Pain Associated symptoms: abdominal pain   Abdominal Pain Associated symptoms: chest pain      Ms. Jankowiak is a 52 year old female with a history of IBS, GERD, fibromyalgia, anxiety, esophageal web, excision of right atrial lipoma who presents today with abdominal pain and chest discomfort.  She reports a month of constipation that has gotten worse over the last two weeks.  She reports her last normal bowel movement was 2 weeks ago.  Since then she has been taking Miralax and has only had loose stools.  She denies blood in the stool.  She says her abdomen is distended and she endorses anorexia.  She says her whole abdomen hurts, worse in the RUQ and LLQ,  She reports history of cholecystectomy, appendectomy, and hysterectomy.  She also endorses several days of "not feeling right" that she attributes to her abnormal heart sensation that was detected on her apple watch.  She says that her heart feels like it isn't beating correctly and she also endorses fatigue.  This began over the last few days and has been intermittent, not exacerbated by anything.  She denies jaw pain, shoulder pain, nausea, vomiting, SOB, or diaphoresis.  Reports a history of an atrial lipoma removal requiring open heart surgery and is followed by cardiology.  She says she is currently experiencing an RA flare so she has wide-spread chronic pain that is uncontrolled.  Denies smoking, alcohol, or drug use.   Home Medications Prior to Admission medications   Medication Sig Start Date End Date Taking? Authorizing Provider  albuterol (VENTOLIN HFA) 108 (90 Base) MCG/ACT inhaler  Inhale 2 puffs into the lungs as needed for wheezing or shortness of breath. 10/08/20   [provider]  Azelastine-Fluticasone 137-50 MCG/ACT SUSP Place 1 spray into both nostrils 2 (two) times daily. 10/08/21   [provider]  Bromelains (BROMELAIN PO) Take 1 tablet by mouth. Three-four times weekly    [provider]  cetirizine (ZYRTEC) 10 MG tablet Take 10 mg by mouth daily. Patient not taking: Reported on 04/14/2023    [provider]  Cholecalciferol (VITAMIN D3) 125 MCG (5000 UT) TABS Take 5,000 Units by mouth daily. With K    [provider]  DULoxetine (CYMBALTA) 20 MG capsule Cymbalta 10 mg qd Patient not taking: Reported on 04/28/2023 04/15/23   Felix Pacini A, DO  EPINEPHrine 0.3 mg/0.3 mL IJ SOAJ injection Inject 0.3 mg into the muscle as needed for anaphylaxis.    [provider]  fexofenadine (ALLEGRA ODT) 30 MG disintegrating tablet Take 30 mg by mouth daily.    [provider]  Glycerin-Hypromellose-PEG 400 (DRY EYE RELIEF DROPS) 0.2-0.2-1 % SOLN Place 1 drop into both eyes daily.    [provider]  ketotifen (ZADITOR) 0.025 % ophthalmic solution 1 drop 2 (two) times daily.    [provider]  l-methylfolate-B6-B12 (METANX) 3-35-2 MG TABS tablet Take 1 tablet by mouth daily.    [provider]  MAGNESIUM PO Take by mouth.    [provider]  Menaquinone-7 (K2 PO) Take 100 mcg by mouth.  [provider]  Methylcobalamin (METHYL B-12 PO) Take by mouth as needed.    [provider]  metoprolol tartrate (LOPRESSOR) 25 MG tablet Take 0.5 tablets (12.5 mg total) by mouth 2 (two) times daily. Patient not taking: Reported on 04/14/2023 10/14/21   Lewayne Bunting, MD  Polyethyl Glycol-Propyl Glycol (SYSTANE OP) Place 1 drop into both eyes 2 (two) times daily.    [provider]  polyethylene glycol (MIRALAX / GLYCOLAX) 17 g packet Take 17 g by mouth as needed.     [provider]  vitamin C (ASCORBIC ACID) 250 MG tablet Take 250 mg by mouth daily.    [provider]  Wheat Dextrin (BENEFIBER) CHEW Chew 1-2 tablets by mouth at bedtime.    [provider]      Allergies    Avocado, Macadamia nut oil, Mango flavor, Other, Ciprofloxacin, Demerol [meperidine], Iodinated contrast media, Macrobid [nitrofurantoin], Betadine [povidone iodine], Latex, Dulcolax [bisacodyl], Sulfamethoxazole, and Tape    Review of Systems   Review of Systems  Cardiovascular:  Positive for chest pain.  Gastrointestinal:  Positive for abdominal pain.  All other systems reviewed and are negative.   Physical Exam Updated Vital Signs BP 124/68   Pulse 86   Temp 99.4 F (37.4 C) (Oral)   Resp 18   Ht 5' 7.5" (1.715 m)   Wt 77.1 kg   LMP 05/20/2014 (Approximate)   SpO2 100%   BMI 26.23 kg/m  Physical Exam Vitals and nursing note reviewed.  Constitutional:      General: She is not in acute distress.    Appearance: She is well-developed.  HENT:     Head: Atraumatic.  Eyes:     Conjunctiva/sclera: Conjunctivae normal.  Cardiovascular:     Rate and Rhythm: Normal rate and regular rhythm.  Pulmonary:     Effort: Pulmonary effort is normal.  Abdominal:     Palpations: Abdomen is soft.     Tenderness: There is abdominal tenderness. There is no right CVA tenderness, left CVA tenderness, guarding or rebound.  Musculoskeletal:     Cervical back: Neck supple.  Skin:    Findings: No rash.  Neurological:     Mental Status: She is alert.  Psychiatric:        Mood and Affect: Mood normal.     ED Results / Procedures / Treatments   Labs (all labs ordered are listed, but only abnormal results are displayed) Labs Reviewed  CBC WITH DIFFERENTIAL/PLATELET - Abnormal; Notable for the following components:      Result Value   WBC 11.5 (*)    Neutro Abs 8.5 (*)    All other components within normal limits  COMPREHENSIVE METABOLIC PANEL -  Abnormal; Notable for the following components:   Glucose, Bld 118 (*)    All other components within normal limits  URINALYSIS, ROUTINE W REFLEX MICROSCOPIC - Abnormal; Notable for the following components:   Color, Urine STRAW (*)    Specific Gravity, Urine 1.004 (*)    Hgb urine dipstick MODERATE (*)    All other components within normal limits  I-STAT CHEM 8, ED - Abnormal; Notable for the following components:   Glucose, Bld 123 (*)    All other components within normal limits  LIPASE, BLOOD  TSH  I-STAT BETA HCG BLOOD, ED ([facility], [facility], [facility] ONLY)  TROPONIN I (HIGH SENSITIVITY)  TROPONIN I (HIGH SENSITIVITY)    EKG EKG Interpretation  Date/Time:  Wednesday April 29 2023 16:22:56 EDT Ventricular  Rate:  87 PR Interval:  138 QRS Duration: 88 QT Interval:  356 QTC Calculation: 428 R Axis:   89 Text Interpretation: Normal sinus rhythm Normal ECG No significant change since last tracing Confirmed by Melene Plan [phone]) on 04/29/2023 6:48:59 PM  Radiology CT Renal Stone Study  Result Date: 04/29/2023 CLINICAL DATA:  Chest pain and abdominal pain. Palpitations. EXAM: CT ABDOMEN AND PELVIS WITHOUT CONTRAST TECHNIQUE: Multidetector CT imaging of the abdomen and pelvis was performed following the standard protocol without IV contrast. RADIATION DOSE REDUCTION: This exam was performed according to the departmental dose-optimization program which includes automated exposure control, adjustment of the mA and/or kV according to patient size and/or use of iterative reconstruction technique. COMPARISON:  CT abdomen and pelvis 07/07/2016 FINDINGS: Lower chest: No acute abnormality. Hepatobiliary: Cholecystectomy. Benign cyst in the posterior right hepatic lobe. No biliary dilation. Pancreas: Unremarkable. Spleen: Unremarkable. Adrenals/Urinary Tract: Normal adrenal glands. No urinary calculi or hydronephrosis. Unremarkable bladder. Stomach/Bowel: Normal caliber large and small bowel. No bowel wall  thickening. Stomach is within normal limits. Vascular/Lymphatic: No significant vascular findings are present. No enlarged abdominal or pelvic lymph nodes. Reproductive: Hysterectomy.  No adnexal mass. Other: No free intraperitoneal fluid or air. Musculoskeletal: No acute fracture or destructive osseous lesion. IMPRESSION: No acute abnormality in the abdomen or pelvis. Electronically Signed   By: Minerva Fester M.D.   On: 04/29/2023 21:39    Procedures Procedures    Medications Ordered in ED Medications  ondansetron (ZOFRAN) injection 4 mg (4 mg Intravenous Not Given 04/29/23 1943)  ketorolac (TORADOL) 15 MG/ML injection 15 mg (has no administration in time range)  sodium chloride 0.9 % bolus 1,000 mL (1,000 mLs Intravenous New Bag/Given 04/29/23 2147)    ED Course/ Medical Decision Making/ A&P                             Medical Decision Making Amount and/or Complexity of Data Reviewed Labs: ordered. Radiology: ordered.  Risk Prescription drug management.   BP 124/68   Pulse 86   Temp 99.4 F (37.4 C) (Oral)   Resp 18   Ht 5' 7.5" (1.715 m)   Wt 77.1 kg   LMP 05/20/2014 (Approximate)   SpO2 100%   BMI 26.23 kg/m   9:28 PM  Ms. Koshy is a 52 year old female with a history of IBS, GERD, fibromyalgia, anxiety, esophageal web, excision of right atrial lipoma who presents today with abdominal pain and chest discomfort.  She reports a month of constipation that has gotten worse over the last two weeks.  She reports her last normal bowel movement was 2 weeks ago.  Since then she has been taking Miralax and has only had loose stools.  She denies blood in the stool.  She says her abdomen is distended and she endorses anorexia.  She says her whole abdomen hurts, worse in the RUQ and LLQ,  She reports history of cholecystectomy, appendectomy, and hysterectomy.  She also endorses several days of "not feeling right" that she attributes to her abnormal heart sensation that was detected on  her apple watch.  She says that her heart feels like it isn't beating correctly and she also endorses fatigue.  This began over the last few days and has been intermittent, not exacerbated by anything.  She denies jaw pain, shoulder pain, nausea, vomiting, SOB, or diaphoresis.  Reports a history of an atrial lipoma removal requiring open heart surgery and is  followed by cardiology.  She says she is currently experiencing an RA flare so she has wide-spread chronic pain that is uncontrolled.  Denies smoking, alcohol, or drug use.   On exam, patient is laying in bed in no acute discomfort.  She does appear mildly anxious.  She speaks in complete sentences.  Heart with normal rate and rhythm, lungs clear to auscultation bilaterally abdomen is soft and mildly tender without focal point tenderness.  No CVA tenderness.  Bowel sounds present.  -Labs ordered, independently viewed and interpreted by me.  Labs remarkable for WBC 11.5, nonspecific -The patient was maintained on a cardiac monitor.  I personally viewed and interpreted the cardiac monitored which showed an underlying rhythm of: NSR -Imaging independently viewed and interpreted by me and I agree with radiologist's interpretation.  Result remarkable for abd/pelvis CT without concerning finding -This patient presents to the ED for concern of abd pain, this involves an extensive number of treatment options, and is a complaint that carries with it a high risk of complications and morbidity.  The differential diagnosis includes SBO, colitis, diverticulitis, pancreatitis, appendicitis, pyelonephritis, UTI, kidney stone -Co morbidities that complicate the patient evaluation includes fibromyalgia, GERD, anxiety, chronic abd pain, esophageal web -Treatment includes toradol, zofran, IVF -Reevaluation of the patient after these medicines showed that the patient improved -PCP office notes or outside notes reviewed -Escalation to admission/observation considered:  patients feels much better, is comfortable with discharge, and will follow up with PCP -Prescription medication considered, patient comfortable with home medication -Social Determinant of Health considered   Patient was made aware that she has blood in her urine.  Patient states she has always had blood in the urine and she does follow-up with a urologist.  There is a family history of Al[place] syndrome.         Final Clinical Impression(s) / ED Diagnoses Final diagnoses:  Generalized abdominal pain  Constipation, unspecified constipation type  Gross hematuria    Rx / DC Orders ED Discharge Orders     None         Fayrene Helper, PA-C 04/29/23 2328    Melene Plan, DO 04/30/23 1556

## 2023-04-29 NOTE — Telephone Encounter (Signed)
   she would need an appointment to discuss furl to a pain management.Marland Kitchen    Specialty teams need office notes in order to see if it is appropriate for that patient to establish.  Therefore we would need to see each other and documentation would need to be provided with the referral.

## 2023-04-29 NOTE — ED Triage Notes (Signed)
The pt has been ill for one week with chest and abd pain for one week  her last normal bm was last Thursday  she has been taking mirilax since then  abd bloating   colonoscopy next thursday

## 2023-04-29 NOTE — Telephone Encounter (Signed)
Spoke to the pt , today around 1400 pt  received notification on her apple watch  irregular HR rhythm.   Pt stated this past Saturday she experienced server constipation, as of today has abdominal pressure, she did take 1/2 metoprolol and feels a little better. However, she still feels "like crap". She is currently on her way to [facility] ED, will forward to MD and nurse.

## 2023-04-29 NOTE — ED Notes (Signed)
Patient transported to CT 

## 2023-04-29 NOTE — ED Provider Triage Note (Signed)
Emergency Medicine Provider Triage Evaluation Note  Emily Joseph , a 52 y.o. female  was evaluated in triage.  Pt complains of chest pain, abdominal pain.  Due to undergo colonoscopy next week.  Endorses some palpitations as well.  Took 1/2 tablet of metoprolol prior to arrival.  States she has been feeling well for at least a week.  Concern for dehydration  Review of Systems  Positive: .  As above Negative: As above  Physical Exam  BP 132/73   Pulse 80   Temp (!) 97.5 F (36.4 C)   Resp 18   Ht 5' 7.5" (1.715 m)   Wt 77.1 kg   LMP 05/20/2014 (Approximate)   SpO2 100%   BMI 26.23 kg/m  Gen:   Awake, no distress  Resp:  Normal effort  MSK:   Moves extremities without difficulty  Other:    Medical Decision Making  Medically screening exam initiated at 3:54 PM.  Appropriate orders placed.  LAVINIA KASAL was informed that the remainder of the evaluation will be completed by another provider, this initial triage assessment does not replace that evaluation, and the importance of remaining in the ED until their evaluation is complete.     Marita Kansas, PA-C 04/29/23 1554

## 2023-04-29 NOTE — ED Notes (Signed)
Patient returned to room from CT. 

## 2023-04-30 ENCOUNTER — Encounter: Payer: Self-pay | Admitting: Family Medicine

## 2023-05-03 ENCOUNTER — Encounter: Payer: Self-pay | Admitting: Internal Medicine

## 2023-05-05 ENCOUNTER — Other Ambulatory Visit (INDEPENDENT_AMBULATORY_CARE_PROVIDER_SITE_OTHER): Payer: BC Managed Care – PPO

## 2023-05-05 ENCOUNTER — Other Ambulatory Visit: Payer: Self-pay | Admitting: Internal Medicine

## 2023-05-05 DIAGNOSIS — R140 Abdominal distension (gaseous): Secondary | ICD-10-CM

## 2023-05-05 DIAGNOSIS — K5909 Other constipation: Secondary | ICD-10-CM

## 2023-05-05 LAB — BASIC METABOLIC PANEL
BUN: 11 mg/dL (ref 6–23)
CO2: 28 mEq/L (ref 19–32)
Calcium: 9.5 mg/dL (ref 8.4–10.5)
Chloride: 103 mEq/L (ref 96–112)
Creatinine, Ser: 0.79 mg/dL (ref 0.40–1.20)
GFR: 86.28 mL/min (ref >60.00)
Glucose, Bld: 105 mg/dL — ABNORMAL HIGH (ref 70–99)
Potassium: 3.6 mEq/L (ref 3.5–5.1)
Sodium: 136 mEq/L (ref 135–145)

## 2023-05-06 ENCOUNTER — Ambulatory Visit (INDEPENDENT_AMBULATORY_CARE_PROVIDER_SITE_OTHER)
Admission: RE | Admit: 2023-05-06 | Discharge: 2023-05-06 | Disposition: A | Discharge status: Home / Self Care | Payer: BC Managed Care – PPO | Source: Ambulatory Visit | Attending: Internal Medicine | Admitting: Internal Medicine

## 2023-05-06 DIAGNOSIS — K5909 Other constipation: Secondary | ICD-10-CM

## 2023-05-06 DIAGNOSIS — R140 Abdominal distension (gaseous): Secondary | ICD-10-CM | POA: Diagnosis not present

## 2023-05-06 NOTE — Anesthesia Preprocedure Evaluation (Signed)
Anesthesia Evaluation  Patient identified by MRN, date of birth, ID band Patient awake    Reviewed: Allergy & Precautions, NPO status , Patient's Chart, lab work & pertinent test results  History of Anesthesia Complications (+) history of anesthetic complications  Airway Mallampati: II  TM Distance: >3 FB Neck ROM: Full    Dental no notable dental hx. (+) Teeth Intact, Dental Advisory Given   Pulmonary shortness of breath   Pulmonary exam normal breath sounds clear to auscultation       Cardiovascular Normal cardiovascular exam+ dysrhythmias Supra Ventricular Tachycardia  Rhythm:Regular Rate:Normal  03/04/2023 TTE  1. Left ventricular ejection fraction, by estimation, is 60 to 65%. The  left ventricle has normal function. The left ventricle has no regional  wall motion abnormalities. GLS -14.3%    s/p minimally invasive resection of right atrial lipoma '17      Neuro/Psych   Anxiety        GI/Hepatic Neg liver ROS,GERD  ,,  Endo/Other    Renal/GU      Musculoskeletal  (+) Arthritis ,  Fibromyalgia -  Abdominal   Peds  Hematology Lab Results      Component                Value               Date                      WBC                      11.5 (H)            04/29/2023                HGB                      13.9                04/29/2023                HCT                      41.0                04/29/2023                MCV                      88.7                04/29/2023                PLT                      315                 04/29/2023              Anesthesia Other Findings All: see list  Reproductive/Obstetrics                             Anesthesia Physical Anesthesia Plan  ASA: 2  Anesthesia Plan: MAC   Post-op Pain Management:    Induction:   PONV Risk Score and Plan: 2 and Propofol infusion and Treatment may vary due to age or medical condition  Airway  Management Planned: Natural Airway and Nasal Cannula  Additional Equipment: None  Intra-op Plan:   Post-operative Plan:   Informed Consent: I have reviewed the patients History and Physical, chart, labs and discussed the procedure including the risks, benefits and alternatives for the proposed anesthesia with the patient or authorized representative who has indicated his/her understanding and acceptance.     Dental advisory given  Plan Discussed with:   Anesthesia Plan Comments: (Colon ca screening Vasovagal reaction, dysphagia)        Anesthesia Quick Evaluation

## 2023-05-07 ENCOUNTER — Other Ambulatory Visit: Payer: Self-pay

## 2023-05-07 ENCOUNTER — Ambulatory Visit (HOSPITAL_COMMUNITY)
Admission: RE | Admit: 2023-05-07 | Discharge: 2023-05-07 | Disposition: A | Discharge status: Home / Self Care | Payer: BC Managed Care – PPO | Attending: Internal Medicine | Admitting: Internal Medicine

## 2023-05-07 ENCOUNTER — Ambulatory Visit (HOSPITAL_COMMUNITY): Payer: BC Managed Care – PPO | Admitting: Physician Assistant

## 2023-05-07 ENCOUNTER — Encounter (HOSPITAL_COMMUNITY)
Admission: RE | Disposition: A | Discharge status: Home / Self Care | Payer: Self-pay | Source: Home / Self Care | Attending: Internal Medicine

## 2023-05-07 ENCOUNTER — Encounter (HOSPITAL_COMMUNITY): Payer: Self-pay | Admitting: Internal Medicine

## 2023-05-07 DIAGNOSIS — R1319 Other dysphagia: Secondary | ICD-10-CM

## 2023-05-07 DIAGNOSIS — F419 Anxiety disorder, unspecified: Secondary | ICD-10-CM | POA: Insufficient documentation

## 2023-05-07 DIAGNOSIS — K581 Irritable bowel syndrome with constipation: Secondary | ICD-10-CM | POA: Insufficient documentation

## 2023-05-07 DIAGNOSIS — R42 Dizziness and giddiness: Secondary | ICD-10-CM | POA: Diagnosis not present

## 2023-05-07 DIAGNOSIS — R002 Palpitations: Secondary | ICD-10-CM | POA: Diagnosis not present

## 2023-05-07 DIAGNOSIS — Z1211 Encounter for screening for malignant neoplasm of colon: Secondary | ICD-10-CM | POA: Diagnosis not present

## 2023-05-07 DIAGNOSIS — R1310 Dysphagia, unspecified: Secondary | ICD-10-CM | POA: Insufficient documentation

## 2023-05-07 DIAGNOSIS — K222 Esophageal obstruction: Secondary | ICD-10-CM | POA: Diagnosis not present

## 2023-05-07 DIAGNOSIS — I471 Supraventricular tachycardia, unspecified: Secondary | ICD-10-CM | POA: Diagnosis not present

## 2023-05-07 DIAGNOSIS — K209 Esophagitis, unspecified without bleeding: Secondary | ICD-10-CM | POA: Diagnosis not present

## 2023-05-07 DIAGNOSIS — R55 Syncope and collapse: Secondary | ICD-10-CM

## 2023-05-07 DIAGNOSIS — M797 Fibromyalgia: Secondary | ICD-10-CM | POA: Diagnosis not present

## 2023-05-07 HISTORY — PX: ESOPHAGEAL DILATION: SHX303

## 2023-05-07 HISTORY — PX: COLONOSCOPY WITH PROPOFOL: SHX5780

## 2023-05-07 HISTORY — DX: Personal history of urinary calculi: Z87.442

## 2023-05-07 HISTORY — PX: BIOPSY: SHX5522

## 2023-05-07 HISTORY — DX: Syncope and collapse: R55

## 2023-05-07 HISTORY — PX: ESOPHAGOGASTRODUODENOSCOPY (EGD) WITH PROPOFOL: SHX5813

## 2023-05-07 SURGERY — COLONOSCOPY WITH PROPOFOL
Anesthesia: Monitor Anesthesia Care

## 2023-05-07 MED ORDER — LACTATED RINGERS IV SOLN
INTRAVENOUS | Status: DC
  Administered 2023-05-07: 1000 mL via INTRAVENOUS

## 2023-05-07 MED ORDER — METOPROLOL TARTRATE 25 MG PO TABS
12.5000 mg | ORAL_TABLET | Freq: Two times a day (BID) | ORAL | Status: AC | PRN
Start: 2023-05-07 — End: ?

## 2023-05-07 MED ORDER — PROPOFOL 10 MG/ML IV BOLUS
INTRAVENOUS | Status: DC | PRN
  Administered 2023-05-07: 20 mg via INTRAVENOUS
  Administered 2023-05-07: 30 mg via INTRAVENOUS
  Administered 2023-05-07 (×2): 20 mg via INTRAVENOUS

## 2023-05-07 MED ORDER — PROPOFOL 500 MG/50ML IV EMUL
INTRAVENOUS | Status: AC
  Filled 2023-05-07: qty 50

## 2023-05-07 MED ORDER — LIDOCAINE 2% (20 MG/ML) 5 ML SYRINGE
INTRAMUSCULAR | Status: DC | PRN
  Administered 2023-05-07: 80 mg via INTRAVENOUS

## 2023-05-07 MED ORDER — SODIUM CHLORIDE 0.9 % IV SOLN: INTRAVENOUS | Status: DC

## 2023-05-07 MED ORDER — PROPOFOL 500 MG/50ML IV EMUL
INTRAVENOUS | Status: DC | PRN
  Administered 2023-05-07: 150 ug/kg/min via INTRAVENOUS

## 2023-05-07 SURGICAL SUPPLY — 25 items

## 2023-05-07 NOTE — Op Note (Addendum)
[facility] Patient Name: Neeva Trew Procedure Date: 05/07/2023 MRN: [id number] Attending MD: Iva Boop , MD, [id number] Date of Birth: 1971-01-07 CSN: [id number] Age: 52 Admit Type: Outpatient Procedure:                Upper GI endoscopy Indications:              Dysphagia Providers:                Iva Boop, MD, Lorenza Evangelist, RN, Martha Clan, RN, Marja Kays, Technician Referring MD:              Medicines:                Monitored Anesthesia Care Complications:            No immediate complications. Estimated Blood Loss:     Estimated blood loss was minimal. Procedure:                Pre-Anesthesia Assessment:                           - Prior to the procedure, a History and Physical                            was performed, and patient medications and                            allergies were reviewed. The patient's tolerance of                            previous anesthesia was also reviewed. The risks                            and benefits of the procedure and the sedation                            options and risks were discussed with the patient.                            All questions were answered, and informed consent                            was obtained. Prior Anticoagulants: The patient has                            taken no anticoagulant or antiplatelet agents. ASA                            Grade Assessment: II - A patient with mild systemic                            disease. After reviewing the risks and benefits,  the patient was deemed in satisfactory condition to                            undergo the procedure.                           After obtaining informed consent, the endoscope was                            passed under direct vision. Throughout the                            procedure, the patient's blood pressure, pulse, and                             oxygen saturations were monitored continuously. The                            [id number]-H190 ([id number]) Olympus endoscope was introduced                            through the mouth, and advanced to the second part                            of duodenum. The upper GI endoscopy was                            accomplished without difficulty. The patient                            tolerated the procedure well. Scope In: Scope Out: Findings:      One benign-appearing, intrinsic moderate (circumferential scarring or       stenosis; an endoscope may pass) stenosis was found in the distal       esophagus. This stenosis measured 1.5 cm (inner diameter). The stenosis       was traversed. A TTS dilator was passed through the scope. Dilation with       a 15-16.5-18 mm balloon dilator was performed to 18 mm. The dilation       site was examined and showed moderate mucosal disruption. Estimated       blood loss was minimal.      Localized moderate mucosal changes characterized by congestion,       longitudinal markings, altered texture and tight circumferential folds       were found in the lower third of the esophagus. Biopsies were taken with       a cold forceps for histology. Verification of patient identification for       the specimen was done. Estimated blood loss was minimal.      Biopsies were taken with a cold forceps in the proximal esophagus for       histology. Estimated blood loss was minimal.      The exam was otherwise without abnormality.      The cardia and gastric fundus were normal on retroflexion. Impression:               - Benign-appearing esophageal stenosis. Dilated.                           -  Congested, longitudinally marked, texture                            changed, tight circumferentially folded mucosa in                            the esophagus. Biopsied.                           - The examination was otherwise normal.                           - Biopsies were taken with a  cold forceps for                            histology in the proximal esophagus. Moderate Sedation:      Not Applicable - Patient had care per Anesthesia. Recommendation:           - Patient has a contact number available for                            emergencies. The signs and symptoms of potential                            delayed complications were discussed with the                            patient. Return to normal activities tomorrow.                            Written discharge instructions were provided to the                            patient.                           - Clear liquids x 1 hour then soft foods rest of                            day. Start prior diet tomorrow.                           - Continue present medications.                           - Await pathology results.                           - See the other procedure note for documentation of                            additional recommendations. colonoscopy next                           - Will review path results - suspect this is a GERD  issue - in 2022 did not have EoE but repeat                            biopsies taken to recheck. PPi likely.                           - Keep 06/29/23 f/u that is scheduled Procedure Code(s):        --- Professional ---                           [phone], Esophagogastroduodenoscopy, flexible,                            transoral; with transendoscopic balloon dilation of                            esophagus (less than 30 mm diameter)                           43239, 59, Esophagogastroduodenoscopy, flexible,                            transoral; with biopsy, single or multiple Diagnosis Code(s):        --- Professional ---                           K22.2, Esophageal obstruction                           K22.89, Other specified disease of esophagus                           R13.10, Dysphagia, unspecified CPT copyright 2022 American Medical  Association. All rights reserved. The codes documented in this report are preliminary and upon coder review may  be revised to meet current compliance requirements. Iva Boop, MD 05/07/2023 10:34:09 AM This report has been signed electronically. Number of Addenda: 0

## 2023-05-07 NOTE — Transfer of Care (Signed)
Immediate Anesthesia Transfer of Care Note  Patient: Emily Joseph  Procedure(s) Performed: COLONOSCOPY WITH PROPOFOL ESOPHAGOGASTRODUODENOSCOPY (EGD) WITH PROPOFOL BIOPSY ESOPHAGEAL DILATION  Patient Location: PACU and Endoscopy Unit  Anesthesia Type:MAC  Level of Consciousness: awake, drowsy, and responds to stimulation  Airway & Oxygen Therapy: Patient Spontanous Breathing and Patient connected to face mask oxygen  Post-op Assessment: Report given to RN and Post -op Vital signs reviewed and stable  Post vital signs: Reviewed and stable  Last Vitals:  Vitals Value Taken Time  BP 118/76 05/07/23 1023  Temp 36.3 C 05/07/23 1023  Pulse 82 05/07/23 1023  Resp 18 05/07/23 1023  SpO2 100 % 05/07/23 1023  Vitals shown include unvalidated device data.  Last Pain:  Vitals:   05/07/23 1023  TempSrc: Temporal  PainSc: 0-No pain         Complications: No notable events documented.

## 2023-05-07 NOTE — Op Note (Signed)
[facility] Patient Name: Emily Joseph Procedure Date: 05/07/2023 MRN: [id number] Attending MD: Iva Boop , MD, [id number] Date of Birth: 07-Mar-1971 CSN: [id number] Age: 52 Admit Type: Outpatient Procedure:                Colonoscopy Indications:              Last colonoscopy: 2014 Providers:                Iva Boop, MD, Lorenza Evangelist, RN, Martha Clan, RN, Marja Kays, Technician Referring MD:              Medicines:                Monitored Anesthesia Care Complications:            No immediate complications. Estimated Blood Loss:     Estimated blood loss: none. Procedure:                Pre-Anesthesia Assessment:                           - Prior to the procedure, a History and Physical                            was performed, and patient medications and                            allergies were reviewed. The patient's tolerance of                            previous anesthesia was also reviewed. The risks                            and benefits of the procedure and the sedation                            options and risks were discussed with the patient.                            All questions were answered, and informed consent                            was obtained. Prior Anticoagulants: The patient has                            taken no anticoagulant or antiplatelet agents. ASA                            Grade Assessment: II - A patient with mild systemic                            disease. After reviewing the risks and benefits,  the patient was deemed in satisfactory condition to                            undergo the procedure.                           - Prior to the procedure, a History and Physical                            was performed, and patient medications and                            allergies were reviewed. The patient's tolerance of                             previous anesthesia was also reviewed. The risks                            and benefits of the procedure and the sedation                            options and risks were discussed with the patient.                            All questions were answered, and informed consent                            was obtained. Prior Anticoagulants: The patient has                            taken no anticoagulant or antiplatelet agents. ASA                            Grade Assessment: II - A patient with mild systemic                            disease. After reviewing the risks and benefits,                            the patient was deemed in satisfactory condition to                            undergo the procedure.                           After obtaining informed consent, the colonoscope                            was passed under direct vision. Throughout the                            procedure, the patient's blood pressure, pulse, and  oxygen saturations were monitored continuously. The                            CF-HQ190L ([id number]) Olympus colonoscope was                            introduced through the anus and advanced to the the                            cecum, identified by appendiceal orifice and                            ileocecal valve. The colonoscopy was performed                            without difficulty. The patient tolerated the                            procedure well. The quality of the bowel                            preparation was good. The ileocecal valve,                            appendiceal orifice, and rectum were photographed.                            The bowel preparation used was Miralax via extended                            prep with split dose instruction. The bowel                            preparation used was GoLYTELY via extended prep                            with split dose instruction. Scope In: 10:02:08 AM Scope  Out: 10:16:19 AM Scope Withdrawal Time: 0 hours 9 minutes 57 seconds  Total Procedure Duration: 0 hours 14 minutes 11 seconds  Findings:      The perianal and digital rectal examinations were normal.      The entire examined colon appeared normal on direct and retroflexion       views. Impression:               - The entire examined colon is normal on direct and                            retroflexion views.                           - No specimens collected. Moderate Sedation:      Not Applicable - Patient had care per Anesthesia. Recommendation:           - Patient has a contact number available for  emergencies. The signs and symptoms of potential                            delayed complications were discussed with the                            patient. Return to normal activities tomorrow.                            Written discharge instructions were provided to the                            patient.                           - Clear liquids x 1 hour then soft foods rest of                            day. Start prior diet tomorrow.                           - Continue present medications.                           - See the other procedure note for documentation of                            additional recommendations.                           - Consider other pharmacologic treatment of                            constipation - had been using MiraLax and senna,                            has been to pelvic PT Procedure Code(s):        --- Professional ---                           [phone], Colonoscopy, flexible; diagnostic, including                            collection of specimen(s) by brushing or washing,                            when performed (separate procedure) Diagnosis Code(s):        --- Professional ---                           Z12.11, Encounter for screening for malignant                            neoplasm of colon CPT copyright 2022  American Medical Association. All rights reserved. The codes documented in this report are preliminary and upon coder  review may  be revised to meet current compliance requirements. Iva Boop, MD 05/07/2023 10:38:07 AM This report has been signed electronically. Number of Addenda: 0

## 2023-05-07 NOTE — H&P (Signed)
[facility] History and Physical   Primary Care Physician:  Natalia Leatherwood, DO   Reason for Procedure:  Dysphagia, recurrent and screening colonoscopy  Plan:    EGD with esophageal dilation and colonoscopy     HPI: Emily Joseph is a 52 y.o. female here for evaluation of dysphagia and a screening colonoscopy.  She has a history of dysphagia and dilation with balloon in October 2022 and moderate mucosal disruption at the GE junction.  She is having some recurrent solid food dysphagia.  She has chronic constipation and IBS issues and is due for a screening colonoscopy. She has been struggling with some worsening constipation and bloating recent electrolytes normal, 2 view abdominal film taken yesterday looks relatively normal to me.  Final report is pending.  There were a couple of air-fluid levels.  In the colon.  Past Medical History:  Diagnosis Date   Adenomyosis    not papanicolaou smear of cervix and cervical HPV   Allergy    Anxiety 06/08/2016   -about her health and about taking medications   Arthritis    ?   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 Nickola Major in 2017, [place] rhe[facility] -seeing Dr. Lucia Gaskins, Neurologist for back pain with radicular symptoms   Complication of anesthesia    "with epidural bp bottoms out"   Dysrhythmia    fast hr   Eosinophilic esophagitis    Sees Dr. Leone Payor   Fibromyalgia    GERD (gastroesophageal reflux disease)    H/O laminectomy 09/07/2020   Heart murmur    History of atrial flutter 10/2016   spontaneous conversion to sinus   History of gallstones    History of kidney stones    History of laparoscopic-assisted vaginal hysterectomy 09/07/2020   History of pneumonia    when pt was pregnant   History of  pneumothorax 07/2016   Right   History of sinus tachycardia    Iron deficiency anemia due to chronic blood loss    Menorrhagia, s/p complete hysterectomy, ovaries remain hx   Irritable bowel syndrome 01/27/2013   Dr Leone Payor 02/2016    Microhematuria    Mouth problem    failed gum graft 1/16   Nasal airway abnormality    Nasal obstruction 06/26/2015   Seen by ENT 06-26-15.  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   Shortness of breath dyspnea    SUI (stress urinary incontinence, female)    Syncope    with last child with epideral   UTI (urinary tract infection) 07/14/2016   >=100,000 COLONIES/mL KLEBSIELLA PNEUMONIA   Vasovagal episode    post eidural   Vitamin D deficiency     Past Surgical History:  Procedure Laterality Date   ABDOMINAL HYSTERECTOMY     APPENDECTOMY  01/1989   BALLOON DILATION N/A 02/09/2013   Procedure: BALLOON DILATION;  Surgeon: Willis Modena, MD;  Location: [facility];  Service: Endoscopy;  Laterality: N/A;   BILATERAL SALPINGECTOMY N/A 06/14/2014   Procedure: BILATERAL SALPINGECTOMY;  Surgeon: Guy Sandifer  Arleta Creek, MD;  Location: [facility];  Service: Gynecology;  Laterality: N/A;   BIOPSY  09/04/2021   Procedure: BIOPSY;  Surgeon: Iva Boop, MD;  Location: [facility];  Service: Endoscopy;;   BREAST BIOPSY Right    biopsy    CHOLECYSTECTOMY N/A 11/21/2019   Procedure: LAPAROSCOPIC CHOLECYSTECTOMY WITH INTRAOPERATIVE CHOLANGIOGRAM;  Surgeon: Ovidio Kin, MD;  Location: [facility];  Service: General;  Laterality: N/A;   COLONOSCOPY WITH PROPOFOL N/A 02/09/2013   Procedure: COLONOSCOPY WITH PROPOFOL;  Surgeon: Willis Modena, MD;  Location: [facility];  Service: Endoscopy;  Laterality: N/A;  need ultra thin colon scope   ESOPHAGEAL DILATION  09/04/2021   Procedure: ESOPHAGEAL  DILATION;  Surgeon: Iva Boop, MD;  Location: [facility];  Service: Endoscopy;;   ESOPHAGOGASTRODUODENOSCOPY Left 06/30/2021   Procedure: ESOPHAGOGASTRODUODENOSCOPY (EGD);  Surgeon: Shellia Cleverly, DO;  Location[facility];  Service: Gastroenterology;  Laterality: Left;   ESOPHAGOGASTRODUODENOSCOPY (EGD) WITH PROPOFOL N/A 02/09/2013   Procedure: ESOPHAGOGASTRODUODENOSCOPY (EGD) WITH PROPOFOL;  Surgeon: Willis Modena, MD;  Location: [facility];  Service: Endoscopy;  Laterality: N/A;   ESOPHAGOGASTRODUODENOSCOPY (EGD) WITH PROPOFOL N/A 09/04/2021   Procedure: ESOPHAGOGASTRODUODENOSCOPY (EGD) WITH PROPOFOL;  Surgeon: Iva Boop, MD;  Location: [facility];  Service: Endoscopy;  Laterality: N/A;   FOREIGN BODY REMOVAL  06/30/2021   Procedure: FOREIGN BODY REMOVAL;  Surgeon: Shellia Cleverly, DO;  Location: [facility];  Service: Gastroenterology;;   LAPAROSCOPIC ASSISTED VAGINAL HYSTERECTOMY Right 06/14/2014   Procedure: OPEN LAPAROSCOPIC ASSISTED VAGINAL HYSTERECTOMY;  Surgeon: Leslie Andrea, MD;  Location: [facility];  Service: Gynecology;  Laterality: Right;   LYSIS OF ADHESION N/A 06/14/2014   Procedure: LYSIS OF ADHESION;  Surgeon: Leslie Andrea, MD;  Location: [facility];  Service: Gynecology;  Laterality: N/A;   MINIMALLY INVASIVE EXCISION OF ATRIAL MYXOMA Right 07/11/2016   Procedure: MINIMALLY INVASIVE RESECTION OF RIGHT ATRIAL LIPOMA WITH CLOSURE OF PATENT FORAMEN OVALE;  Surgeon: Purcell Nails, MD;  Location: [facility];  Service: Open Heart Surgery;  Laterality: Right;   OVARIAN CYST REMOVAL Right 1990   TEE WITHOUT CARDIOVERSION N/A 07/11/2016   Procedure: TRANSESOPHAGEAL ECHOCARDIOGRAM (TEE);  Surgeon: Purcell Nails, MD;  Location: [facility] OR;  Service: Open Heart Surgery;  Laterality: N/A;    Prior to Admission medications   Medication Sig Start Date End Date Taking? Authorizing Provider  Azelastine HCl 137 MCG/SPRAY SOLN Place 1 spray into both nostrils daily.  04/22/23  Yes [provider]  Bromelains (BROMELAIN PO) Take 1 tablet by mouth. Three-four times weekly   Yes [provider]  Digestive Enzymes (DIGESTIVE ENZYME PO) Take 1 capsule by mouth with breakfast, with lunch, and with evening meal.   Yes [provider]  fexofenadine (ALLEGRA ODT) 30 MG disintegrating tablet Take 30 mg by mouth in the morning.   Yes [provider]  Glycerin-Hypromellose-PEG 400 (DRY EYE RELIEF DROPS) 0.2-0.2-1 % SOLN Place 1 drop into both eyes daily.   Yes [provider]  ibuprofen (ADVIL) 200 MG tablet Take 200-400 mg by mouth 2 (two) times daily as needed (pain.).   Yes [provider]  Levomefolate Glucosamine (METHYL-FOLATE PO) Take 1 tablet by mouth daily with lunch.   Yes [provider]  Methylcobalamin (METHYL B-12) 1000 MCG LOZG Take 1,000 mcg by mouth daily with breakfast.   Yes [provider]  metoprolol tartrate (LOPRESSOR) 25 MG tablet Take 0.5 tablets (12.5 mg total) by mouth 2 (two) times daily. Patient  taking differently: Take 12.5 mg by mouth 2 (two) times daily as needed (arrhythmia). 10/14/21  Yes Lewayne Bunting, MD  Polyethyl Glycol-Propyl Glycol (SYSTANE OP) Place 1 drop into both eyes 4 (four) times daily as needed (dry/irritated eyes.).   Yes [provider]  polyethylene glycol (MIRALAX / GLYCOLAX) 17 g packet Take 17 g by mouth daily.   Yes [provider]  Pyridoxine HCl (VITAMIN B-6 PO) Take 1 tablet by mouth daily.   Yes [provider]  vitamin C (ASCORBIC ACID) 250 MG tablet Take 250 mg by mouth daily.   Yes [provider]  Vitamin D-Vitamin K (K2 PLUS D3 PO) Take 1 tablet by mouth daily.   Yes [provider]  albuterol (VENTOLIN HFA) 108 (90 Base) MCG/ACT inhaler Inhale 2 puffs into the lungs as needed for wheezing or shortness of breath. 10/08/20   [provider]  DULoxetine (CYMBALTA) 20 MG capsule Cymbalta  10 mg qd Patient not taking: Reported on 04/28/2023 04/15/23   Felix Pacini A, DO  EPINEPHrine 0.3 mg/0.3 mL IJ SOAJ injection Inject 0.3 mg into the muscle as needed for anaphylaxis.    [provider]  Wheat Dextrin (BENEFIBER) CHEW Chew 1-2 tablets by mouth at bedtime.    [provider]    No current facility-administered medications for this encounter.    Allergies as of 04/01/2023 - Review Complete 04/01/2023  Allergen Reaction Noted   Avocado Shortness Of Breath and Nausea And Vomiting 01/26/2013   Macadamia nut oil Hives and Swelling 02/07/2013   Mango flavor Swelling 05/29/2014   Other Other (See Comments) 05/29/2014   Ciprofloxacin Other (See Comments) 04/13/2013   Demerol [meperidine] Other (See Comments) 02/07/2013   Iodinated contrast media Hives and Other (See Comments) 10/25/2012   Macrobid [nitrofurantoin] Other (See Comments) 07/04/2016   Betadine [povidone iodine]  11/21/2019   Latex Swelling 05/29/2014   Dulcolax [bisacodyl] Palpitations 07/12/2016   Sulfamethoxazole Nausea Only, Other (See Comments), and Nausea And Vomiting 09/03/2006   Tape Other (See Comments) and Rash 03/11/2018    Family History  Problem Relation Age of Onset   Cancer Mother    Breast cancer Mother 79   Lung cancer Mother    CAD Father        MI at age 46   Stroke Father    Cancer Father        Cholangiocarcinoma   Myasthenia gravis Sister    Chiari malformation Sister    Hematuria Sister    Goiter Maternal Grandmother    Cancer Maternal Grandfather    Lung cancer Maternal Grandfather    Rheum arthritis Paternal Grandmother    Stroke Paternal Grandfather    Kidney disease Paternal Grandfather    Alport syndrome Paternal Grandfather    Polycystic ovary syndrome Daughter        pre-diabetic    Ehlers-Danlos syndrome Daughter    Allergies Daughter    Healthy Daughter    Healthy Son    Esophageal cancer Paternal Uncle     Social History   Socioeconomic  History   Marital status: Married    Spouse name: Trey Paula    Number of children: 4   Years of education: 12+   Highest education level: Not on file  Occupational History   Not on file  Tobacco Use   Smoking status: Never    Passive exposure: Never   Smokeless tobacco: Never  Vaping Use   Vaping Use: Never used  Substance and  Sexual Activity   Alcohol use: Yes    Alcohol/week: 0.0 standard drinks of alcohol    Comment: 3 glasses wine per week or less    Drug use: No   Sexual activity: Yes    Partners: Male    Birth control/protection: Surgical  Other Topics Concern   Not on file  Social History Narrative   Social History:   Now stays at home -  feels can barely function just to keep house and take care of  4 children. Husband travels and works a lot.   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      Trey Paula - husband; [phone] -daughter; Helmut Muster- son Park Meo 2003 daughter Evangeline Gula 2008 daughter    Healthy diet - lots of food allergies. Avoiding gluten currently.      Of note, at age 81 she had a very traumatic medical experience. She apparently was in the hospital for some time and had many needlesticks, many CT scans and surgery for an ovarian mass. This was very traumatizing for her. She still gets anxiety when she is going to see a doctor or health care provider. She had a flashback to this time when she went for acupuncture.        -Wears a bicycle helmet riding a bike: Yes     -smoke alarm in the home:Yes     - wears seatbelt: Yes     - Feels safe in their relationships: Yes   Social Determinants of Health   Financial Resource Strain: Not on file  Food Insecurity: Not on file  Transportation Needs: Not on file  Physical Activity: Not on file  Stress: Not on file  Social Connections: Not on file  Intimate Partner Violence: Not on file    Review of Systems: Positive for  palpitations, dizziness All other review of systems negative except as mentioned in the HPI.  Physical Exam: Vital signs Ht 5' 7.5" (1.715 m)   Wt 77.1 kg   LMP 05/20/2014 (Approximate)   BMI 26.23 kg/m   General:   Alert,  Well-developed, well-nourished, pleasant and cooperative in NAD Lungs:  Clear throughout to auscultation.   Heart:  Regular rate and rhythm; no murmurs, clicks, rubs,  or gallops. Abdomen:  Soft, nontender and nondistended. Normal bowel sounds.   Neuro/Psych:  Alert and cooperative. Normal mood and affect. A and O x 3   @Blaire Palomino  Sena Slate, MD, [facility] [phone] (pager) 05/07/2023 9:33 AM@

## 2023-05-07 NOTE — Discharge Instructions (Addendum)
I found scar tissue - stricture in the esophagus again and dilated it. You will swallow better. I took biopsies to try to understand cause bt I am suspecting GERD and that you will need acid-blocker.  The colonoscopy was normal.  Once I see biopsy results will call and review those and recommendations. Need to consider other medications for constipation though sometimes a good purge like this makes a difference. Your xray seemed ok - await radiology reading also.  I appreciate the opportunity to care for you. Iva Boop, MD, FACG  YOU HAD AN ENDOSCOPIC PROCEDURE TODAY: Refer to the procedure report and other information in the discharge instructions given to you for any specific questions about what was found during the examination. If this information does not answer your questions, please call Dr. Marvell Fuller office at [phone] to clarify.   YOU SHOULD EXPECT: Some feelings of bloating in the abdomen. Passage of more gas than usual. Walking can help get rid of the air that was put into your GI tract during the procedure and reduce the bloating. If you had a lower endoscopy (such as a colonoscopy or flexible sigmoidoscopy) you may notice spotting of blood in your stool or on the toilet paper. Some abdominal soreness may be present for a day or two, also.  DIET:   Clear liquids only until 1130 then soft foods today and try regular consistency foods tomorrow. No alcohol today.  ACTIVITY: Your care partner should take you home directly after the procedure. You should plan to take it easy, moving slowly for the rest of the day. You can resume normal activity the day after the procedure however YOU SHOULD NOT DRIVE, use power tools, machinery or perform tasks that involve climbing or major physical exertion for 24 hours (because of the sedation medicines used during the test).   SYMPTOMS TO REPORT IMMEDIATELY: A gastroenterologist can be reached at any hour. Please call [phone]  for any  of the following symptoms:  Following lower endoscopy (colonoscopy, flexible sigmoidoscopy) Excessive amounts of blood in the stool  Significant tenderness, worsening of abdominal pains  Swelling of the abdomen that is new, acute  Fever of 100 or higher  Following upper endoscopy (EGD, EUS, ERCP, esophageal dilation) Vomiting of blood or coffee ground material  New, significant abdominal pain  New, significant chest pain or pain under the shoulder blades  Painful or persistently difficult swallowing  New shortness of breath  Black, tarry-looking or red, bloody stools  FOLLOW UP:  If any biopsies were taken you will be contacted by phone or by letter within the next 1-3 weeks. Call [phone]  if you have not heard about the biopsies in 3 weeks.  Please also call with any specific questions about appointments or follow up tests.

## 2023-05-07 NOTE — Anesthesia Postprocedure Evaluation (Signed)
Anesthesia Post Note  Patient: Emily Joseph  Procedure(s) Performed: COLONOSCOPY WITH PROPOFOL ESOPHAGOGASTRODUODENOSCOPY (EGD) WITH PROPOFOL BIOPSY ESOPHAGEAL DILATION     Patient location during evaluation: Endoscopy Anesthesia Type: MAC Level of consciousness: awake and alert Pain management: pain level controlled Vital Signs Assessment: post-procedure vital signs reviewed and stable Respiratory status: spontaneous breathing, nonlabored ventilation, respiratory function stable and patient connected to nasal cannula oxygen Cardiovascular status: blood pressure returned to baseline and stable Postop Assessment: no apparent nausea or vomiting Anesthetic complications: no   No notable events documented.  Last Vitals:  Vitals:   05/07/23 1033 05/07/23 1043  BP: 130/87 128/67  Pulse: 86 76  Resp: 20 11  Temp:    SpO2: 100% 100%    Last Pain:  Vitals:   05/07/23 1043  TempSrc:   PainSc: 0-No pain                 Trevor Iha

## 2023-05-07 NOTE — Anesthesia Procedure Notes (Signed)
Procedure Name: MAC Date/Time: 05/07/2023 9:42 AM  Performed by: Elyn Peers, CRNAPre-anesthesia Checklist: Patient identified, Emergency Drugs available, Suction available, Patient being monitored and Timeout performed Oxygen Delivery Method: Simple face mask Preoxygenation: POM Used. Placement Confirmation: positive ETCO2

## 2023-05-08 LAB — SURGICAL PATHOLOGY

## 2023-05-10 ENCOUNTER — Encounter (HOSPITAL_COMMUNITY): Payer: Self-pay | Admitting: Internal Medicine

## 2023-05-11 ENCOUNTER — Encounter: Payer: Self-pay | Admitting: Internal Medicine

## 2023-05-11 ENCOUNTER — Ambulatory Visit (INDEPENDENT_AMBULATORY_CARE_PROVIDER_SITE_OTHER): Payer: BC Managed Care – PPO | Admitting: Family Medicine

## 2023-05-11 VITALS — BP 121/80 | HR 91 | Temp 97.4°F | Wt 169.2 lb

## 2023-05-11 DIAGNOSIS — E079 Disorder of thyroid, unspecified: Secondary | ICD-10-CM | POA: Diagnosis not present

## 2023-05-11 DIAGNOSIS — M1712 Unilateral primary osteoarthritis, left knee: Secondary | ICD-10-CM

## 2023-05-11 DIAGNOSIS — R5383 Other fatigue: Secondary | ICD-10-CM | POA: Diagnosis not present

## 2023-05-11 DIAGNOSIS — G8929 Other chronic pain: Secondary | ICD-10-CM | POA: Diagnosis not present

## 2023-05-11 DIAGNOSIS — R1319 Other dysphagia: Secondary | ICD-10-CM

## 2023-05-11 DIAGNOSIS — M7918 Myalgia, other site: Secondary | ICD-10-CM

## 2023-05-11 NOTE — Patient Instructions (Signed)
No follow-ups on file.        Great to see you today.  I have refilled the medication(s) we provide.   If labs were collected, we will inform you of lab results once received either by echart message or telephone call.   - echart message- for normal results that have been seen by the patient already.   - telephone call: abnormal results or if patient has not viewed results in their echart.  
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
## 2023-05-12 NOTE — Addendum Note (Signed)
Addended by: Felix Pacini A on: 05/12/2023 03:02 PM   Modules accepted: Orders

## 2023-05-13 ENCOUNTER — Telehealth: Payer: Self-pay | Admitting: Family Medicine

## 2023-05-13 ENCOUNTER — Encounter: Payer: Self-pay | Admitting: Internal Medicine

## 2023-05-13 ENCOUNTER — Ambulatory Visit (INDEPENDENT_AMBULATORY_CARE_PROVIDER_SITE_OTHER): Payer: BC Managed Care – PPO

## 2023-05-13 DIAGNOSIS — E079 Disorder of thyroid, unspecified: Secondary | ICD-10-CM

## 2023-05-13 DIAGNOSIS — R1319 Other dysphagia: Secondary | ICD-10-CM

## 2023-05-13 NOTE — Telephone Encounter (Signed)
Please inform patient her thyroid US did not show evidence of an enlarged thyroid or large nodules that would cause her symptoms.

## 2023-05-13 NOTE — Telephone Encounter (Signed)
Spoke with patient regarding results/recommendations.  

## 2023-05-15 ENCOUNTER — Other Ambulatory Visit: Payer: Self-pay | Admitting: Internal Medicine

## 2023-05-15 MED ORDER — OMEPRAZOLE 40 MG PO CPDR
40.0000 mg | DELAYED_RELEASE_CAPSULE | Freq: Every day | ORAL | 3 refills | Status: DC
Start: 2023-05-15 — End: 2023-06-29

## 2023-05-15 NOTE — Progress Notes (Unsigned)
lansop

## 2023-06-29 ENCOUNTER — Encounter: Payer: Self-pay | Admitting: Internal Medicine

## 2023-06-29 ENCOUNTER — Ambulatory Visit (INDEPENDENT_AMBULATORY_CARE_PROVIDER_SITE_OTHER): Payer: BC Managed Care – PPO | Admitting: Internal Medicine

## 2023-06-29 VITALS — BP 106/76 | HR 96 | Ht 67.0 in | Wt 169.1 lb

## 2023-06-29 DIAGNOSIS — K222 Esophageal obstruction: Secondary | ICD-10-CM | POA: Diagnosis not present

## 2023-06-29 DIAGNOSIS — K2100 Gastro-esophageal reflux disease with esophagitis, without bleeding: Secondary | ICD-10-CM | POA: Diagnosis not present

## 2023-06-29 DIAGNOSIS — R55 Syncope and collapse: Secondary | ICD-10-CM

## 2023-06-29 DIAGNOSIS — K9041 Non-celiac gluten sensitivity: Secondary | ICD-10-CM

## 2023-06-29 MED ORDER — OMEPRAZOLE 20 MG PO CPDR
20.0000 mg | DELAYED_RELEASE_CAPSULE | Freq: Every day | ORAL | 3 refills | Status: DC
Start: 2023-06-29 — End: 2024-09-19

## 2023-06-29 NOTE — Patient Instructions (Signed)
Glad you are doing better off gluten, please continue doing that.  Dr Leone Payor has decreased your omeprazole to 20mg  daily. This has been sent in to your pharmacy.   You have been scheduled for an endoscopy. Please follow written instructions given to you at your visit today.  If you use inhalers (even only as needed), please bring them with you on the day of your procedure.  If you take any of the following medications, they will need to be adjusted prior to your procedure:   DO NOT TAKE 7 DAYS PRIOR TO TEST- Trulicity (dulaglutide) Ozempic, Wegovy (semaglutide) Mounjaro (tirzepatide) Bydureon Bcise (exanatide extended release)  DO NOT TAKE 1 DAY PRIOR TO YOUR TEST Rybelsus (semaglutide) Adlyxin (lixisenatide) Victoza (liraglutide) Byetta (exanatide) ___________________________________________________________________________     I appreciate the opportunity to care for you. Stan Head, MD, [facility]

## 2023-06-29 NOTE — Progress Notes (Signed)
Emily Joseph 52 y.o. Nov 10, 1971 [id number]  Assessment & Plan:   Encounter Diagnoses  Name Primary?   Gastroesophageal reflux disease with esophagitis without hemorrhage Yes   Esophageal stricture    Gluten intolerance suspected    Vasovagal reaction    She is better on all fronts.  Will continue PPI to treat GERD with stricture.  There was some cytological atypia on the biopsies of the esophagus which are probably reactive but pathologist have recommended repeat biopsy after therapy so we will schedule for an EGD.  This will be done at the hospital given her history of vasovagal reaction and prior cardiac surgery.   She was asking about reducing the dose of omeprazole and I think she has been on 40 mg long enough and we can cut back to 20 mg daily.  She believes her urine is more red than usual (has chronic red urine and hematuria she says).  I told her I did not think that was related to the omeprazole but is reasonable to cut back to 20 mg daily.  We reviewed the diagnosis of celiac disease and gluten allergy, celiac testing was negative with serologies in 2017.  She feels better off gluten so she perhaps she is gluten sensitive.  Chronic pain syndrome is much better off of that.  She will continue working with her pain management physician and she plans to stay gluten-free.  We reviewed that she would need to return to gluten for a couple of weeks before we could do a serologic test that she prefers not to do that.    Subjective:  Prior GI work-up notes:   Distal esophageal web dilation to 18 mm October 2022 Biopsies to check for recurrence of eosinophilic esophagitis were negative.  Originally diagnosed 2014.   Colonoscopy 2014 Dr. Dulce Sellar normal   Celiac antibodies -2017 (TTG antibody IgA)   Cholecystectomy January 2021 for gallstones negative cholangiogram.   Chronic right upper quadrant pain without clear etiology   Chronic constipation/IBS type problems  May 07, 2023 EGD with esophageal biopsies and stricture dilation to 18 mm, normal colonoscopy.  Esophageal biopsies with GERD changes and some cytologic atypia of unclear significance    Chief Complaint: f/u esophagitis  HPI Bijal returns for follow-up, she has a history of chronic constipation and a lot of functional GI complaints and is status post recent EGD and colonoscopy as outlined above, reporting she is better.  Dysphagia is gone.  She is on omeprazole 40 mg daily she thinks her urine which is chronically red may be more red than usual.  She is asking about reducing to 20 mg on the omeprazole because of the warnings about kidney disease.  Her daughter has Alport syndrome and Emily Joseph says she has an abnormal gene for it and has chronic hematuria.  Constipation is better.  She went gluten-free which is helping her pain syndrome tremendously.  We had previously reviewed that her celiac testing was negative. Allergies  Allergen Reactions   Avocado Shortness Of Breath and Nausea And Vomiting   Macadamia Nut Oil Hives and Swelling    & Walnuts. LIPS SWELL    Mango Flavor Swelling    SWELLING OF THE LIPS   Other Other (See Comments)    [name]   Ciprofloxacin Other (See Comments)    SEVERE JOINT PAIN   Demerol [Meperidine] Other (See Comments)  HALLUCINATIONS    Iodinated Contrast Media Hives and Other (See Comments)    Patient had IVP @ age 89 and broke out in Hives (was once pre-treated with several meds)   Macrobid [Nitrofurantoin] Other (See Comments)    CHEST PAIN   Betadine [Povidone Iodine]     hives   Gluten Meal    Latex Swelling    Please use paper tape or nitrile gloves   Dulcolax [Bisacodyl] Palpitations    Felt light headed and dizzy   Sulfamethoxazole Nausea Only, Other (See Comments) and Nausea And Vomiting    Childhood reaction - upset stomach   Tape Other (See  Comments) and Rash    Family allergy; this is to be avoided; tolerates paper tape   Current Meds  Medication Sig   albuterol (VENTOLIN HFA) 108 (90 Base) MCG/ACT inhaler Inhale 2 puffs into the lungs as needed for wheezing or shortness of breath.   Azelastine HCl 137 MCG/SPRAY SOLN Place 1 spray into both nostrils daily.   Bromelains (BROMELAIN PO) Take 1 tablet by mouth. Three-four times weekly   Digestive Enzymes (DIGESTIVE ENZYME PO) Take 1 capsule by mouth with breakfast, with lunch, and with evening meal.   DULoxetine (CYMBALTA) 20 MG capsule Cymbalta 10 mg qd   EPINEPHrine 0.3 mg/0.3 mL IJ SOAJ injection Inject 0.3 mg into the muscle as needed for anaphylaxis.   fexofenadine (ALLEGRA ODT) 30 MG disintegrating tablet Take 30 mg by mouth in the morning.   Glycerin-Hypromellose-PEG 400 (DRY EYE RELIEF DROPS) 0.2-0.2-1 % SOLN Place 1 drop into both eyes daily.   ibuprofen (ADVIL) 200 MG tablet Take 200-400 mg by mouth 2 (two) times daily as needed (pain.).   Levomefolate Glucosamine (METHYL-FOLATE PO) Take 1 tablet by mouth daily with lunch.   Methylcobalamin (METHYL B-12) 1000 MCG LOZG Take 1,000 mcg by mouth daily with breakfast.   metoprolol tartrate (LOPRESSOR) 25 MG tablet Take 0.5 tablets (12.5 mg total) by mouth 2 (two) times daily as needed (arrhythmia).   omeprazole (PRILOSEC) 40 MG capsule Take 1 capsule (40 mg total) by mouth daily before breakfast. 30-60 mins before   Polyethyl Glycol-Propyl Glycol (SYSTANE OP) Place 1 drop into both eyes 4 (four) times daily as needed (dry/irritated eyes.).   polyethylene glycol (MIRALAX / GLYCOLAX) 17 g packet Take 17 g by mouth daily.   Pyridoxine HCl (VITAMIN B-6 PO) Take 1 tablet by mouth daily.   vitamin C (ASCORBIC ACID) 250 MG tablet Take 250 mg by mouth daily.   Vitamin D-Vitamin K (K2 PLUS D3 PO) Take 1 tablet by mouth daily.   Past Medical History:  Diagnosis Date   Adenomyosis    not papanicolaou smear of cervix and cervical HPV    Allergy    Anxiety 06/08/2016   -about her health and about taking medications   Arthritis    ?   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 Nickola Major in 2017, [place] [facility] -seeing Dr. Lucia Gaskins, Neurologist for back pain with radicular symptoms   Complication of anesthesia    "with epidural bp bottoms out"   Dysrhythmia    fast hr   Eosinophilic esophagitis    Sees Dr. Leone Payor   Fibromyalgia    GERD (gastroesophageal reflux disease)  H/O laminectomy 09/07/2020   Heart murmur    History of atrial flutter 10/2016   spontaneous conversion to sinus   History of gallstones    History of kidney stones    History of laparoscopic-assisted vaginal hysterectomy 09/07/2020   History of pneumonia    when pt was pregnant   History of pneumothorax 07/2016   Right   History of sinus tachycardia    Iron deficiency anemia due to chronic blood loss    Menorrhagia, s/p complete hysterectomy, ovaries remain hx   Irritable bowel syndrome 01/27/2013   Dr Leone Payor 02/2016    Microhematuria    Mouth problem    failed gum graft 1/16   Nasal airway abnormality    Nasal obstruction 06/26/2015   Seen by ENT 06-26-15.  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   Shortness of breath dyspnea    SUI (stress urinary incontinence, female)    Syncope    with last child with epideral   UTI (urinary tract infection) 07/14/2016   >=100,000 COLONIES/mL KLEBSIELLA PNEUMONIA   Vasovagal episode    post eidural   Vitamin D deficiency    Past Surgical History:  Procedure Laterality Date   ABDOMINAL HYSTERECTOMY      APPENDECTOMY  01/1989   BALLOON DILATION N/A 02/09/2013   Procedure: BALLOON DILATION;  Surgeon: Willis Modena, MD;  Location: [facility];  Service: Endoscopy;  Laterality: N/A;   BILATERAL SALPINGECTOMY N/A 06/14/2014   Procedure: BILATERAL SALPINGECTOMY;  Surgeon: Leslie Andrea, MD;  Location: [facility];  Service: Gynecology;  Laterality: N/A;   BIOPSY  09/04/2021   Procedure: BIOPSY;  Surgeon: Iva Boop, MD;  Location: [facility];  Service: Endoscopy;;   BIOPSY  05/07/2023   Procedure: BIOPSY;  Surgeon: Iva Boop, MD;  Location: [facility];  Service: Gastroenterology;;   BREAST BIOPSY Right    biopsy    CHOLECYSTECTOMY N/A 11/21/2019   Procedure: LAPAROSCOPIC CHOLECYSTECTOMY WITH INTRAOPERATIVE CHOLANGIOGRAM;  Surgeon: Ovidio Kin, MD;  Location: [facility];  Service: General;  Laterality: N/A;   COLONOSCOPY WITH PROPOFOL N/A 02/09/2013   Procedure: COLONOSCOPY WITH PROPOFOL;  Surgeon: Willis Modena, MD;  Location: [facility];  Service: Endoscopy;  Laterality: N/A;  need ultra thin colon scope   COLONOSCOPY WITH PROPOFOL N/A 05/07/2023   Procedure: COLONOSCOPY WITH PROPOFOL;  Surgeon: Iva Boop, MD;  Location: [facility];  Service: Gastroenterology;  Laterality: N/A;   ESOPHAGEAL DILATION  09/04/2021   Procedure: ESOPHAGEAL DILATION;  Surgeon: Iva Boop, MD;  Location: [facility];  Service: Endoscopy;;   ESOPHAGEAL DILATION  05/07/2023   Procedure: ESOPHAGEAL DILATION;  Surgeon: Iva Boop, MD;  Location: [facility];  Service: Gastroenterology;;   ESOPHAGOGASTRODUODENOSCOPY Left 06/30/2021   Procedure: ESOPHAGOGASTRODUODENOSCOPY (EGD);  Surgeon: Shellia Cleverly, DO;  Location: [facility];  Service: Gastroenterology;  Laterality: Left;   ESOPHAGOGASTRODUODENOSCOPY (EGD) WITH PROPOFOL N/A 02/09/2013   Procedure: ESOPHAGOGASTRODUODENOSCOPY (EGD) WITH PROPOFOL;  Surgeon: Willis Modena, MD;  Location: [facility];  Service: Endoscopy;   Laterality: N/A;   ESOPHAGOGASTRODUODENOSCOPY (EGD) WITH PROPOFOL N/A 09/04/2021   Procedure: ESOPHAGOGASTRODUODENOSCOPY (EGD) WITH PROPOFOL;  Surgeon: Iva Boop, MD;  Location: [facility];  Service: Endoscopy;  Laterality: N/A;   ESOPHAGOGASTRODUODENOSCOPY (EGD) WITH PROPOFOL N/A 05/07/2023  Procedure: ESOPHAGOGASTRODUODENOSCOPY (EGD) WITH PROPOFOL;  Surgeon: Iva Boop, MD;  Location: [facility];  Service: Gastroenterology;  Laterality: N/A;   FOREIGN BODY REMOVAL  06/30/2021   Procedure: FOREIGN BODY REMOVAL;  Surgeon: Shellia Cleverly, DO;  Location: [facility];  Service: Gastroenterology;;   LAPAROSCOPIC ASSISTED VAGINAL HYSTERECTOMY Right 06/14/2014   Procedure: OPEN LAPAROSCOPIC ASSISTED VAGINAL HYSTERECTOMY;  Surgeon: Leslie Andrea, MD;  Location: [facility];  Service: Gynecology;  Laterality: Right;   LYSIS OF ADHESION N/A 06/14/2014   Procedure: LYSIS OF ADHESION;  Surgeon: Leslie Andrea, MD;  Location: [facility];  Service: Gynecology;  Laterality: N/A;   MINIMALLY INVASIVE EXCISION OF ATRIAL MYXOMA Right 07/11/2016   Procedure: MINIMALLY INVASIVE RESECTION OF RIGHT ATRIAL LIPOMA WITH CLOSURE OF PATENT FORAMEN OVALE;  Surgeon: Purcell Nails, MD;  Location: [facility];  Service: Open Heart Surgery;  Laterality: Right;   OVARIAN CYST REMOVAL Right 1990   TEE WITHOUT CARDIOVERSION N/A 07/11/2016   Procedure: TRANSESOPHAGEAL ECHOCARDIOGRAM (TEE);  Surgeon: Purcell Nails, MD;  Location: [facility] OR;  Service: Open Heart Surgery;  Laterality: N/A;   Social History   Social History Narrative   Social History:   Now stays at home -  feels can barely function just to keep house and take care of  4 children. Husband travels and works a lot.   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      Trey Paula - husband; [phone] -daughter; Helmut Muster- son Park Meo 2003 daughter Evangeline Gula 2008 daughter     Healthy diet - lots of food allergies. Avoiding gluten currently.      Of note, at age 69 she had a very traumatic medical experience. She apparently was in the hospital for some time and had many needlesticks, many CT scans and surgery for an ovarian mass. This was very traumatizing for her. She still gets anxiety when she is going to see a doctor or health care provider. She had a flashback to this time when she went for acupuncture.        -Wears a bicycle helmet riding a bike: Yes     -smoke alarm in the home:Yes     - wears seatbelt: Yes     - Feels safe in their relationships: Yes   family history includes Allergies in her daughter; Alport syndrome in her paternal grandfather; Breast cancer (age of onset: 43) in her mother; CAD in her father; Cancer in her father, maternal grandfather, and mother; Chiari malformation in her sister; Ehlers-Danlos syndrome in her daughter; Esophageal cancer in her paternal uncle; Goiter in her maternal grandmother; Healthy in her daughter and son; Hematuria in her sister; Kidney disease in her paternal grandfather; Lung cancer in her maternal grandfather and mother; Myasthenia gravis in her sister; Polycystic ovary syndrome in her daughter; Rheum arthritis in her paternal grandmother; Stroke in her father and paternal grandfather.   Review of Systems As above  Objective:   Physical Exam BP 106/76   Pulse 96   Ht 5\' 7"  (1.702 m)   Wt 169 lb 2 oz (76.7 kg)   LMP 05/20/2014 (Approximate)   BMI 26.49 kg/m    32 minutes total time on this visit

## 2023-07-03 ENCOUNTER — Encounter: Payer: Self-pay | Admitting: Internal Medicine

## 2023-07-20 ENCOUNTER — Encounter: Payer: Self-pay | Admitting: Family Medicine

## 2023-07-28 ENCOUNTER — Telehealth: Payer: Self-pay | Admitting: Cardiology

## 2023-07-28 ENCOUNTER — Other Ambulatory Visit: Payer: Self-pay

## 2023-07-28 DIAGNOSIS — K222 Esophageal obstruction: Secondary | ICD-10-CM

## 2023-07-28 NOTE — Telephone Encounter (Signed)
Pt was contacted and made aware of Dr. Leone Payor recommendations: Pt EGD was rescheduled for 09/24/2023 at 7:30 AM at [facility] . Pt made aware. Prep instructions were sent to pt. Pt made aware Ambulatory referral to GI placed in EPIC.  Pt verbalized understanding with all questions answered.

## 2023-07-28 NOTE — Telephone Encounter (Signed)
Pt would like to know if she should take hr "heart medicine" the night before procedure. She is unsure of what was said on her visit.

## 2023-07-28 NOTE — Telephone Encounter (Signed)
Patient is requesting call back from nurse to ask questions about a future procedure she is having done. She states she doesn't need another clearance due to already having clearance for the same type of procedure, but she would like to be reminded on the instructions of said clearance.

## 2023-07-28 NOTE — Telephone Encounter (Signed)
Spoke with pt, discussed endo and she does not need to take the metoprolol if she is feeling okay and her heart rate is not elevated.

## 2023-08-03 ENCOUNTER — Encounter: Payer: Self-pay | Admitting: Family Medicine

## 2023-08-03 ENCOUNTER — Ambulatory Visit (INDEPENDENT_AMBULATORY_CARE_PROVIDER_SITE_OTHER): Payer: BC Managed Care – PPO | Admitting: Family Medicine

## 2023-08-03 VITALS — BP 116/77 | HR 76 | Temp 97.9°F | Wt 169.2 lb

## 2023-08-03 DIAGNOSIS — E559 Vitamin D deficiency, unspecified: Secondary | ICD-10-CM

## 2023-08-03 DIAGNOSIS — M797 Fibromyalgia: Secondary | ICD-10-CM

## 2023-08-03 DIAGNOSIS — M2550 Pain in unspecified joint: Secondary | ICD-10-CM | POA: Diagnosis not present

## 2023-08-03 LAB — COMPREHENSIVE METABOLIC PANEL
ALT: 24 U/L (ref 0–35)
AST: 14 U/L (ref 0–37)
Albumin: 4.4 g/dL (ref 3.5–5.2)
Alkaline Phosphatase: 87 U/L (ref 39–117)
BUN: 13 mg/dL (ref 6–23)
CO2: 28 mEq/L (ref 19–32)
Calcium: 9.5 mg/dL (ref 8.4–10.5)
Chloride: 104 mEq/L (ref 96–112)
Creatinine, Ser: 0.77 mg/dL (ref 0.40–1.20)
GFR: 88.83 mL/min (ref >60.00)
Glucose, Bld: 143 mg/dL — ABNORMAL HIGH (ref 70–99)
Potassium: 3.9 mEq/L (ref 3.5–5.1)
Sodium: 140 mEq/L (ref 135–145)
Total Bilirubin: 0.5 mg/dL (ref 0.2–1.2)
Total Protein: 6.9 g/dL (ref 6.0–8.3)

## 2023-08-03 LAB — VITAMIN D 25 HYDROXY (VIT D DEFICIENCY, FRACTURES): VITD: 40.53 ng/mL (ref 30.00–100.00)

## 2023-08-03 LAB — MAGNESIUM: Magnesium: 2 mg/dL (ref 1.5–2.5)

## 2023-08-03 MED ORDER — TIZANIDINE HCL 2 MG PO TABS: 1.0000 mg | ORAL_TABLET | Freq: Four times a day (QID) | ORAL | 1 refills | Status: DC | PRN
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
## 2023-08-04 LAB — PTH, INTACT AND CALCIUM
Calcium: 9.6 mg/dL (ref 8.6–10.4)
PTH: 25 pg/mL (ref 16–77)

## 2023-08-07 ENCOUNTER — Encounter: Payer: Self-pay | Admitting: Family Medicine

## 2023-08-07 NOTE — Telephone Encounter (Signed)
No further action needed.

## 2023-08-11 ENCOUNTER — Ambulatory Visit: Payer: BC Managed Care – PPO | Attending: Physician Assistant | Admitting: Physician Assistant

## 2023-08-11 ENCOUNTER — Encounter: Payer: Self-pay | Admitting: Physician Assistant

## 2023-08-11 VITALS — BP 115/77 | HR 75 | Resp 15 | Ht 67.0 in | Wt 170.4 lb

## 2023-08-11 DIAGNOSIS — F419 Anxiety disorder, unspecified: Secondary | ICD-10-CM

## 2023-08-11 DIAGNOSIS — M797 Fibromyalgia: Secondary | ICD-10-CM | POA: Diagnosis not present

## 2023-08-11 DIAGNOSIS — M79642 Pain in left hand: Secondary | ICD-10-CM

## 2023-08-11 DIAGNOSIS — R5382 Chronic fatigue, unspecified: Secondary | ICD-10-CM | POA: Diagnosis not present

## 2023-08-11 DIAGNOSIS — R768 Other specified abnormal immunological findings in serum: Secondary | ICD-10-CM

## 2023-08-11 DIAGNOSIS — Z91018 Allergy to other foods: Secondary | ICD-10-CM

## 2023-08-11 DIAGNOSIS — Z9109 Other allergy status, other than to drugs and biological substances: Secondary | ICD-10-CM

## 2023-08-11 DIAGNOSIS — M792 Neuralgia and neuritis, unspecified: Secondary | ICD-10-CM

## 2023-08-11 DIAGNOSIS — I5189 Other ill-defined heart diseases: Secondary | ICD-10-CM

## 2023-08-11 DIAGNOSIS — K2100 Gastro-esophageal reflux disease with esophagitis, without bleeding: Secondary | ICD-10-CM

## 2023-08-11 DIAGNOSIS — M79641 Pain in right hand: Secondary | ICD-10-CM | POA: Diagnosis not present

## 2023-08-11 DIAGNOSIS — M7712 Lateral epicondylitis, left elbow: Secondary | ICD-10-CM

## 2023-08-11 DIAGNOSIS — M7711 Lateral epicondylitis, right elbow: Secondary | ICD-10-CM | POA: Diagnosis not present

## 2023-08-11 DIAGNOSIS — M7062 Trochanteric bursitis, left hip: Secondary | ICD-10-CM

## 2023-08-11 DIAGNOSIS — K581 Irritable bowel syndrome with constipation: Secondary | ICD-10-CM

## 2023-08-11 DIAGNOSIS — M7061 Trochanteric bursitis, right hip: Secondary | ICD-10-CM

## 2023-08-11 DIAGNOSIS — M1712 Unilateral primary osteoarthritis, left knee: Secondary | ICD-10-CM

## 2023-08-11 DIAGNOSIS — M6289 Other specified disorders of muscle: Secondary | ICD-10-CM

## 2023-09-16 ENCOUNTER — Encounter (HOSPITAL_COMMUNITY): Payer: Self-pay | Admitting: Internal Medicine

## 2023-09-16 NOTE — Progress Notes (Signed)
Attempted to obtain medical history for pre op call via telephone, unable to reach at this time. HIPAA compliant voicemail message left requesting return call to pre surgical testing department.

## 2023-09-16 NOTE — Telephone Encounter (Signed)
Orders were placed to be drawn IF she was experiencing new or worsening symptoms consistent with autoimmune disease. No need to get updated lab work unless she has developed new or worsening symptoms

## 2023-09-23 NOTE — Anesthesia Preprocedure Evaluation (Addendum)
Anesthesia Evaluation  Patient identified by MRN, date of birth, ID band Patient awake    Reviewed: Allergy & Precautions, NPO status , Patient's Chart, lab work & pertinent test results, reviewed documented beta blocker date and time   Airway Mallampati: I  TM Distance: >3 FB Neck ROM: Full    Dental no notable dental hx. (+) Teeth Intact, Dental Advisory Given   Pulmonary asthma    Pulmonary exam normal breath sounds clear to auscultation       Cardiovascular Normal cardiovascular exam+ dysrhythmias Atrial Fibrillation  Rhythm:Regular Rate:Normal  TTE 2024  1. Left ventricular ejection fraction, by estimation, is 60 to 65%. The  left ventricle has normal function. The left ventricle has no regional  wall motion abnormalities. GLS -14.3%   2. Right ventricular systolic function is normal. The right ventricular  size is normal. There is normal pulmonary artery systolic pressure.   3. The mitral valve is normal in structure. No evidence of mitral valve  regurgitation. No evidence of mitral stenosis.   4. The aortic valve is normal in structure. Aortic valve regurgitation is  not visualized. Aortic valve sclerosis is present, with no evidence of  aortic valve stenosis.   5. The inferior vena cava is normal in size with greater than 50%  respiratory variability, suggesting right atrial pressure of 3 mmHg.     s/p minimally invasive resection of right atrial lipoma '17    Neuro/Psych  PSYCHIATRIC DISORDERS Anxiety     negative neurological ROS     GI/Hepatic Neg liver ROS,GERD  ,,  Endo/Other  negative endocrine ROS    Renal/GU negative Renal ROS  negative genitourinary   Musculoskeletal  (+) Arthritis ,  Fibromyalgia -  Abdominal   Peds  Hematology negative hematology ROS (+)   Anesthesia Other Findings   Reproductive/Obstetrics                             Anesthesia Physical Anesthesia  Plan  ASA: 3  Anesthesia Plan: MAC   Post-op Pain Management:    Induction: Intravenous  PONV Risk Score and Plan: Propofol infusion and Treatment may vary due to age or medical condition  Airway Management Planned: Natural Airway  Additional Equipment:   Intra-op Plan:   Post-operative Plan:   Informed Consent: I have reviewed the patients History and Physical, chart, labs and discussed the procedure including the risks, benefits and alternatives for the proposed anesthesia with the patient or authorized representative who has indicated his/her understanding and acceptance.     Dental advisory given  Plan Discussed with: CRNA  Anesthesia Plan Comments:        Anesthesia Quick Evaluation

## 2023-09-24 ENCOUNTER — Encounter (HOSPITAL_COMMUNITY)
Admission: RE | Disposition: A | Discharge status: Home / Self Care | Payer: Self-pay | Source: Home / Self Care | Attending: Internal Medicine

## 2023-09-24 ENCOUNTER — Ambulatory Visit (HOSPITAL_COMMUNITY)
Admission: RE | Admit: 2023-09-24 | Discharge: 2023-09-24 | Disposition: A | Discharge status: Home / Self Care | Payer: BC Managed Care – PPO | Attending: Internal Medicine | Admitting: Internal Medicine

## 2023-09-24 ENCOUNTER — Ambulatory Visit (HOSPITAL_COMMUNITY): Payer: BC Managed Care – PPO | Admitting: Anesthesiology

## 2023-09-24 ENCOUNTER — Encounter (HOSPITAL_COMMUNITY): Payer: Self-pay | Admitting: Internal Medicine

## 2023-09-24 ENCOUNTER — Other Ambulatory Visit: Payer: Self-pay

## 2023-09-24 ENCOUNTER — Ambulatory Visit (HOSPITAL_COMMUNITY): Payer: Self-pay | Admitting: Anesthesiology

## 2023-09-24 DIAGNOSIS — K222 Esophageal obstruction: Secondary | ICD-10-CM

## 2023-09-24 DIAGNOSIS — M1990 Unspecified osteoarthritis, unspecified site: Secondary | ICD-10-CM | POA: Insufficient documentation

## 2023-09-24 DIAGNOSIS — M797 Fibromyalgia: Secondary | ICD-10-CM | POA: Insufficient documentation

## 2023-09-24 DIAGNOSIS — K219 Gastro-esophageal reflux disease without esophagitis: Secondary | ICD-10-CM

## 2023-09-24 DIAGNOSIS — J45909 Unspecified asthma, uncomplicated: Secondary | ICD-10-CM | POA: Diagnosis not present

## 2023-09-24 DIAGNOSIS — K2100 Gastro-esophageal reflux disease with esophagitis, without bleeding: Secondary | ICD-10-CM

## 2023-09-24 HISTORY — PX: BIOPSY: SHX5522

## 2023-09-24 HISTORY — PX: ESOPHAGOGASTRODUODENOSCOPY (EGD) WITH PROPOFOL: SHX5813

## 2023-09-24 SURGERY — ESOPHAGOGASTRODUODENOSCOPY (EGD) WITH PROPOFOL
Anesthesia: Monitor Anesthesia Care

## 2023-09-24 MED ORDER — SODIUM CHLORIDE 0.9 % IV SOLN: INTRAVENOUS | Status: DC

## 2023-09-24 MED ORDER — PROPOFOL 500 MG/50ML IV EMUL
INTRAVENOUS | Status: AC
  Filled 2023-09-24: qty 50

## 2023-09-24 MED ORDER — PROPOFOL 500 MG/50ML IV EMUL
INTRAVENOUS | Status: DC | PRN
  Administered 2023-09-24: 125 ug/kg/min via INTRAVENOUS

## 2023-09-24 MED ORDER — LIDOCAINE 2% (20 MG/ML) 5 ML SYRINGE
INTRAMUSCULAR | Status: DC | PRN
  Administered 2023-09-24: 100 mg via INTRAVENOUS

## 2023-09-24 MED ORDER — PROPOFOL 10 MG/ML IV BOLUS
INTRAVENOUS | Status: DC | PRN
  Administered 2023-09-24: 20 mg via INTRAVENOUS
  Administered 2023-09-24: 30 mg via INTRAVENOUS
  Administered 2023-09-24: 20 mg via INTRAVENOUS

## 2023-09-24 SURGICAL SUPPLY — 15 items

## 2023-09-24 NOTE — Anesthesia Postprocedure Evaluation (Signed)
Anesthesia Post Note  Patient: Emily Joseph  Procedure(s) Performed: ESOPHAGOGASTRODUODENOSCOPY (EGD) WITH PROPOFOL BIOPSY     Patient location during evaluation: Endoscopy Anesthesia Type: MAC Level of consciousness: awake and alert Pain management: pain level controlled Vital Signs Assessment: post-procedure vital signs reviewed and stable Respiratory status: spontaneous breathing, nonlabored ventilation, respiratory function stable and patient connected to nasal cannula oxygen Cardiovascular status: blood pressure returned to baseline and stable Postop Assessment: no apparent nausea or vomiting Anesthetic complications: no  No notable events documented.  Last Vitals:  Vitals:   09/24/23 0844 09/24/23 0854  BP: (!) 118/50 130/61  Pulse: 74 82  Resp: 13 11  Temp:    SpO2: 100% 100%    Last Pain:  Vitals:   09/24/23 0854  TempSrc:   PainSc: 0-No pain                 Emily Joseph

## 2023-09-24 NOTE — Op Note (Addendum)
[facility] Patient Name: Emily Joseph Procedure Date: 09/24/2023 MRN: [id number] Attending MD: Iva Boop , MD, [id number] Date of Birth: 08-13-1971 CSN: [id number] Age: 52 Admit Type: Outpatient Procedure:                Upper GI endoscopy Indications:              Esophageal reflux, Follow-up of esophageal reflux Providers:                Iva Boop, MD, Lorenza Evangelist, RN, Kandice Robinsons, Technician, Geoffery Lyons, Technician Referring MD:              Medicines:                 Complications:            No immediate complications. Estimated Blood Loss:     Estimated blood loss was minimal. Procedure:                Pre-Anesthesia Assessment:                           - Prior to the procedure, a History and Physical                            was performed, and patient medications and                            allergies were reviewed. The patient's tolerance of                            previous anesthesia was also reviewed. The risks                            and benefits of the procedure and the sedation                            options and risks were discussed with the patient.                            All questions were answered, and informed consent                            was obtained. Prior Anticoagulants: The patient has                            taken no anticoagulant or antiplatelet agents. ASA                            Grade Assessment: III - A patient with severe                            systemic disease. After reviewing the risks and  benefits, the patient was deemed in satisfactory                            condition to undergo the procedure.                           After obtaining informed consent, the endoscope was                            passed under direct vision. Throughout the                            procedure, the patient's blood pressure, pulse, and                             oxygen saturations were monitored continuously. The                            [id number]-[id number] ([id number]) Olympus endoscope was introduced                            through the mouth, and advanced to the second part                            of duodenum. The upper GI endoscopy was                            accomplished without difficulty. The patient                            tolerated the procedure well. Scope In: Scope Out: Findings:      The examined esophagus was normal.      The entire examined stomach was normal.      The examined duodenum was normal.      The cardia and gastric fundus were normal on retroflexion.      Biopsies were taken with a cold forceps in the distal esophagus for       histology. Verification of patient identification for the specimen was       done. Estimated blood loss was minimal. Impression:               - Normal esophagus.                           - Normal stomach.                           - Normal examined duodenum.                           - Biopsies were taken with a cold forceps for                            histology in the distal esophagus. In June there  were changes of reflux and some focal atypia so                            follow-up biopsies were taken                           procedure preformed at hospital due to hx vasovagal                            reactions Moderate Sedation:      Not Applicable - Patient had care per Anesthesia. Recommendation:           - Patient has a contact number available for                            emergencies. The signs and symptoms of potential                            delayed complications were discussed with the                            patient. Return to normal activities tomorrow.                            Written discharge instructions were provided to the                            patient.                           - Resume previous diet.                            - Continue present medications.                           - Await pathology results. Procedure Code(s):        --- Professional ---                           [phone], Esophagogastroduodenoscopy, flexible,                            transoral; with biopsy, single or multiple Diagnosis Code(s):        --- Professional ---                           K21.9, Gastro-esophageal reflux disease without                            esophagitis CPT copyright 2022 American Medical Association. All rights reserved. The codes documented in this report are preliminary and upon coder review may  be revised to meet current compliance requirements. Iva Boop, MD 09/24/2023 8:42:56 AM This report has been signed electronically. Number of Addenda: 0

## 2023-09-24 NOTE — Anesthesia Procedure Notes (Signed)
Procedure Name: MAC Date/Time: 09/24/2023 8:18 AM  Performed by: Sindy Guadeloupe, CRNAPre-anesthesia Checklist: Patient identified, Emergency Drugs available, Suction available, Patient being monitored and Timeout performed Oxygen Delivery Method: Simple face mask Placement Confirmation: positive ETCO2

## 2023-09-24 NOTE — Discharge Instructions (Addendum)
YOU HAD AN ENDOSCOPIC PROCEDURE TODAY: Refer to the procedure report and other information in the discharge instructions given to you for any specific questions about what was found during the examination. If this information does not answer your questions, please call [place] office at [phone] to clarify.   YOU SHOULD EXPECT: Some feelings of bloating in the abdomen. Passage of more gas than usual. Walking can help get rid of the air that was put into your GI tract during the procedure and reduce the bloating. If you had a lower endoscopy (such as a colonoscopy or flexible sigmoidoscopy) you may notice spotting of blood in your stool or on the toilet paper. Some abdominal soreness may be present for a day or two, also.  DIET: Your first meal following the procedure should be a light meal and then it is ok to progress to your normal diet. A half-sandwich or bowl of soup is an example of a good first meal. Heavy or fried foods are harder to digest and may make you feel nauseous or bloated. Drink plenty of fluids but you should avoid alcoholic beverages for 24 hours. If you had a esophageal dilation, please see attached instructions for diet.    ACTIVITY: Your care partner should take you home directly after the procedure. You should plan to take it easy, moving slowly for the rest of the day. You can resume normal activity the day after the procedure however YOU SHOULD NOT DRIVE, use power tools, machinery or perform tasks that involve climbing or major physical exertion for 24 hours (because of the sedation medicines used during the test).   SYMPTOMS TO REPORT IMMEDIATELY: A gastroenterologist can be reached at any hour. Please call [phone]  for any of the following symptoms:  Following lower endoscopy (colonoscopy, flexible sigmoidoscopy) Excessive amounts of blood in the stool  Significant tenderness, worsening of abdominal pains  Swelling of the abdomen that is new, acute  Fever of 100 or  higher  Following upper endoscopy (EGD, EUS, ERCP, esophageal dilation) Vomiting of blood or coffee ground material  New, significant abdominal pain  New, significant chest pain or pain under the shoulder blades  Painful or persistently difficult swallowing  New shortness of breath  Black, tarry-looking or red, bloody stools  FOLLOW UP:  If any biopsies were taken you will be contacted by phone or by letter within the next 1-3 weeks. Call [phone]  if you have not heard about the biopsies in 3 weeks.  Please also call with any specific questions about appointments or follow up tests.I thought things look ok - no stricture seen. I took biopsies of the esophagus and will let you know results.  I appreciate the opportunity to care for you. Iva Boop, MD, [facility]
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
## 2023-09-24 NOTE — Transfer of Care (Signed)
Immediate Anesthesia Transfer of Care Note  Patient: Emily Joseph  Procedure(s) Performed: ESOPHAGOGASTRODUODENOSCOPY (EGD) WITH PROPOFOL BIOPSY  Patient Location: PACU and Endoscopy Unit  Anesthesia Type:MAC  Level of Consciousness: awake, alert , and patient cooperative  Airway & Oxygen Therapy: Patient Spontanous Breathing and Patient connected to face mask oxygen  Post-op Assessment: Report given to RN and Post -op Vital signs reviewed and stable  Post vital signs: Reviewed and stable  Last Vitals:  Vitals Value Taken Time  BP 106/41   Temp    Pulse 89 09/24/23 0834  Resp 19 09/24/23 0834  SpO2 100 % 09/24/23 0834  Vitals shown include unfiled device data.  Last Pain:  Vitals:   09/24/23 0706  TempSrc: Tympanic  PainSc: 0-No pain         Complications: No notable events documented.

## 2023-09-25 LAB — SURGICAL PATHOLOGY

## 2023-09-27 ENCOUNTER — Encounter (HOSPITAL_COMMUNITY): Payer: Self-pay | Admitting: Internal Medicine

## 2023-11-23 NOTE — Telephone Encounter (Signed)
 Ok to release future orders placed to be drawn for further evaluation

## 2023-12-17 ENCOUNTER — Encounter: Payer: Self-pay | Admitting: Family Medicine

## 2023-12-17 NOTE — Telephone Encounter (Signed)
 I would recommend evaluation by her PCP.

## 2023-12-17 NOTE — Telephone Encounter (Signed)
 Fibromyalgia causes frequent flares with generalized pain and increased pain under the skin.  However fibromyalgia does not cause inflammation.  NSAIDs are not safe for the stomach and cause increased risk of ulcers and GI bleeding.  She may consider discussing Cymbalta  with her PCP.  Usual management of fibromyalgia is physical therapy, water aerobics and swimming.

## 2023-12-18 NOTE — Telephone Encounter (Signed)
 Please call patient and schedule her for virtual visit to discuss.

## 2023-12-25 ENCOUNTER — Ambulatory Visit: Payer: BC Managed Care – PPO | Admitting: Family Medicine

## 2023-12-25 ENCOUNTER — Encounter: Payer: Self-pay | Admitting: Family Medicine

## 2023-12-25 VITALS — BP 128/80 | HR 98 | Temp 98.0°F | Wt 174.6 lb

## 2023-12-25 DIAGNOSIS — E559 Vitamin D deficiency, unspecified: Secondary | ICD-10-CM

## 2023-12-25 DIAGNOSIS — R5383 Other fatigue: Secondary | ICD-10-CM

## 2023-12-25 DIAGNOSIS — R6883 Chills (without fever): Secondary | ICD-10-CM | POA: Diagnosis not present

## 2023-12-25 DIAGNOSIS — B070 Plantar wart: Secondary | ICD-10-CM

## 2023-12-25 DIAGNOSIS — R6889 Other general symptoms and signs: Secondary | ICD-10-CM

## 2023-12-25 MED ORDER — TIZANIDINE HCL 2 MG PO TABS: 1.0000 mg | ORAL_TABLET | Freq: Four times a day (QID) | ORAL | 1 refills | Status: DC | PRN

## 2023-12-25 NOTE — Patient Instructions (Signed)

## 2023-12-25 NOTE — Progress Notes (Signed)
Emily Joseph , 10-Oct-1971, 53 y.o., female MRN: [id number] Patient Care Team    Relationship Specialty Notifications Start End  Natalia Leatherwood, DO PCP - General Family Medicine  04/29/23   Lewayne Bunting, MD PCP - Cardiology Cardiology  07/22/20   Harold Hedge, MD Consulting Physician Obstetrics and Gynecology  12/15/19   Lewayne Bunting, MD Consulting Physician Cardiology  12/15/19   Iva Boop, MD Consulting Physician Gastroenterology  12/15/19   Eileen Stanford, MD Referring Physician Allergy and Immunology  12/15/19   Purcell Nails, MD (Inactive) Consulting Physician Cardiothoracic Surgery  12/15/19   Ovidio Kin, MD Consulting Physician General Surgery  12/15/19   [facility]  Ophthalmology  12/15/19     Chief Complaint  Patient presents with   Foot Pain    Pt c/o of a wart on her heel that is causing pain/discomfort. Pt wants to insure there is no infection. Pt also mentions a discussion of a rheumatology referral.      Subjective: Emily Joseph is a 53 y.o. Pt presents for an OV with complaints of foot pain of a few days duration.  Associated symptoms include she has plantar warts that have been resistant to treatment at this location.  She put a topical OTC tx on it and noticed peeling and discomfort. She wants to make sure it is not infected.  She is est with derm and podiatry     08/03/2023   11:05 AM 10/13/2022    9:55 AM 09/24/2020    1:57 PM 12/13/2019    1:42 PM 12/26/2016    2:05 PM  Depression screen PHQ 2/9  Decreased Interest 0 0 0 0 0  Down, Depressed, Hopeless  0 0 1 0  PHQ - 2 Score 0 0 0 1 0  Altered sleeping 1      Tired, decreased energy 1      Change in appetite 1      Feeling bad or failure about yourself  1      Trouble concentrating 0      Moving slowly or fidgety/restless 1      Suicidal thoughts 0      PHQ-9 Score 5      Difficult doing work/chores Somewhat difficult        Allergies  Allergen Reactions    Avocado Shortness Of Breath and Nausea And Vomiting   Macadamia Nut Oil Hives and Swelling    & Walnuts. LIPS SWELL    Mango Flavoring Agent (Non-Screening) Swelling    SWELLING OF THE LIPS   Other Other (See Comments)    [name]   Ciprofloxacin Other (See Comments)    SEVERE JOINT PAIN   Demerol [Meperidine] Other (See Comments)    HALLUCINATIONS    Iodinated Contrast Media Hives and Other (See Comments)    Patient had IVP @ age 59 and broke out in Hives (was once pre-treated with several meds)   Macrobid [Nitrofurantoin] Other (See Comments)    CHEST PAIN   Betadine [Povidone Iodine]     hives   Gluten Meal    Latex Swelling    Please use paper tape or nitrile gloves   Dulcolax [Bisacodyl] Palpitations    Felt light headed and dizzy   Sulfamethoxazole Nausea Only, Other (See Comments) and Nausea And Vomiting    Childhood  reaction - upset stomach   Tape Other (See Comments) and Rash    Family allergy; this is to be avoided; tolerates paper tape   Social History   Social History Narrative   Social History:   Now stays at home -  feels can barely function just to keep house and take care of  4 children. Husband travels and works a lot.   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      Trey Paula - husband; [phone] -daughter; Helmut Muster- son Park Meo 2003 daughter Evangeline Gula 2008 daughter    Healthy diet - lots of food allergies. Avoiding gluten currently.      Of note, at age 53 she had a very traumatic medical experience. She apparently was in the hospital for some time and had many needlesticks, many CT scans and surgery for an ovarian mass. This was very traumatizing for her. She still gets anxiety when she is going to see a doctor or health care provider. She had a flashback to this  time when she went for acupuncture.        -Wears a bicycle helmet riding a bike: Yes     -smoke alarm in the home:Yes     - wears seatbelt: Yes     - Feels safe in their relationships: Yes   Past Medical History:  Diagnosis Date   Adenomyosis    not papanicolaou smear of cervix and cervical HPV   Allergy    Anxiety 06/08/2016   -about her health and about taking medications   Arthritis    ?   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 Nickola Major in 2017, [place] [facility] -seeing Dr. Lucia Gaskins, Neurologist for back pain with radicular symptoms   Complication of anesthesia    "with epidural bp bottoms out"   Dysrhythmia    fast hr   Eosinophilic esophagitis    Sees Dr. Leone Payor   Fibromyalgia    GERD (gastroesophageal reflux disease)    H/O laminectomy 09/07/2020   Heart murmur    History of atrial flutter 10/2016   spontaneous conversion to sinus   History of gallstones    History of kidney stones    History of laparoscopic-assisted vaginal hysterectomy 09/07/2020   History of pneumonia    when pt was pregnant   History of pneumothorax 07/2016   Right   History of sinus tachycardia    Iron deficiency anemia due to chronic blood loss    Menorrhagia, s/p complete hysterectomy, ovaries remain hx   Irritable bowel syndrome 01/27/2013   Dr Leone Payor 02/2016    Microhematuria    Mouth problem    failed gum graft 1/16   Nasal airway abnormality    Nasal obstruction 06/26/2015   Seen by ENT 06-26-15.  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   Shortness of breath dyspnea    SUI (stress urinary  incontinence, female)    Syncope  with last child with epideral   UTI (urinary tract infection) 07/14/2016   >=100,000 COLONIES/mL KLEBSIELLA PNEUMONIA   Vasovagal episode    post eidural   Vitamin D deficiency    Past Surgical History:  Procedure Laterality Date   ABDOMINAL HYSTERECTOMY     APPENDECTOMY  01/1989   BALLOON DILATION N/A 02/09/2013   Procedure: BALLOON DILATION;  Surgeon: Willis Modena, MD;  Location: [facility];  Service: Endoscopy;  Laterality: N/A;   BILATERAL SALPINGECTOMY N/A 06/14/2014   Procedure: BILATERAL SALPINGECTOMY;  Surgeon: Leslie Andrea, MD;  Location: [facility];  Service: Gynecology;  Laterality: N/A;   BIOPSY  09/04/2021   Procedure: BIOPSY;  Surgeon: Iva Boop, MD;  Location: [facility];  Service: Endoscopy;;   BIOPSY  05/07/2023   Procedure: BIOPSY;  Surgeon: Iva Boop, MD;  Location: [facility];  Service: Gastroenterology;;   BIOPSY  09/24/2023   Procedure: BIOPSY;  Surgeon: Iva Boop, MD;  Location: [facility];  Service: Gastroenterology;;   BREAST BIOPSY Right    biopsy    CHOLECYSTECTOMY N/A 11/21/2019   Procedure: LAPAROSCOPIC CHOLECYSTECTOMY WITH INTRAOPERATIVE CHOLANGIOGRAM;  Surgeon: Ovidio Kin, MD;  Location: [facility];  Service: General;  Laterality: N/A;   COLONOSCOPY WITH PROPOFOL N/A 02/09/2013   Procedure: COLONOSCOPY WITH PROPOFOL;  Surgeon: Willis Modena, MD;  Location: [facility];  Service: Endoscopy;  Laterality: N/A;  need ultra thin colon scope   COLONOSCOPY WITH PROPOFOL N/A 05/07/2023   Procedure: COLONOSCOPY WITH PROPOFOL;  Surgeon: Iva Boop, MD;  Location: [facility];  Service: Gastroenterology;  Laterality: N/A;   ESOPHAGEAL DILATION  09/04/2021   Procedure: ESOPHAGEAL DILATION;  Surgeon: Iva Boop, MD;  Location: [facility];  Service: Endoscopy;;   ESOPHAGEAL DILATION  05/07/2023   Procedure: ESOPHAGEAL DILATION;  Surgeon: Iva Boop, MD;  [facility]: [facility];   Service: Gastroenterology;;   ESOPHAGOGASTRODUODENOSCOPY Left 06/30/2021   Procedure: ESOPHAGOGASTRODUODENOSCOPY (EGD);  Surgeon: Shellia Cleverly, DO;  Location: [facility] END[facility]COPY;  Service: Gastroenterology;  Laterality: Left;   ESOPHAGOGASTRODUODENOSCOPY (EGD) WITH PROPOFOL N/A 02/09/2013   Procedure: ESOPHAGOGASTRODUODENOSCOPY (EGD) WITH PROPOFOL;  Surgeon: Willis Modena, MD;  [facility];  Service: Endoscopy;  Laterality: N/A;   ESOPHAGOGASTRODUODENOSCOPY (EGD) WITH PROPOFOL N/A 09/04/2021   Procedure: ESOPHAGOGASTRODUODENOSCOPY (EGD) WITH PROPOFOL;  Surgeon: Iva Boop, MD;  [facility];  Service: Endoscopy;  Laterality: N/A;   ESOPHAGOGASTRODUODENOSCOPY (EGD) WITH PROPOFOL N/A 05/07/2023   Procedure: ESOPHAGOGASTRODUODENOSCOPY (EGD) WITH PROPOFOL;  Surgeon: Iva Boop, MD;  [facility]: [facility];  Service: Gastroenterology;  Laterality: N/A;   ESOPHAGOGASTRODUODENOSCOPY (EGD) WITH PROPOFOL N/A 09/24/2023   Procedure: ESOPHAGOGASTRODUODENOSCOPY (EGD) WITH PROPOFOL;  Surgeon: Iva Boop, MD;  Location: [facility];  Service: Gastroenterology;  Laterality: N/A;   FOREIGN BODY REMOVAL  06/30/2021   Procedure: FOREIGN BODY REMOVAL;  Surgeon: Shellia Cleverly, DO;  Location: [facility];  Service: Gastroenterology;;   LAPAROSCOPIC ASSISTED VAGINAL HYSTERECTOMY Right 06/14/2014   Procedure: OPEN LAPAROSCOPIC ASSISTED VAGINAL HYSTERECTOMY;  Surgeon: Leslie Andrea, MD;  Location: [facility];  Service: Gynecology;  Laterality: Right;   LYSIS OF ADHESION N/A 06/14/2014   Procedure: LYSIS OF ADHESION;  Surgeon: Leslie Andrea, MD;  Location: [facility];  Service: Gynecology;  Laterality: N/A;   MINIMALLY INVASIVE EXCISION OF ATRIAL MYXOMA Right 07/11/2016   Procedure: MINIMALLY INVASIVE RESECTION OF RIGHT ATRIAL LIPOMA WITH CLOSURE OF PATENT FORAMEN OVALE;  Surgeon: Purcell Nails, MD;  Location: [facility] OR;  Service: Open  Heart Surgery;  Laterality: Right;   OVARIAN  CYST REMOVAL Right 1990   TEE WITHOUT CARDIOVERSION N/A 07/11/2016   Procedure: TRANSESOPHAGEAL ECHOCARDIOGRAM (TEE);  Surgeon: Purcell Nails, MD;  Location: [facility] OR;  Service: Open Heart Surgery;  Laterality: N/A;   Family History  Problem Relation Age of Onset   Cancer Mother    Breast cancer Mother 6   Lung cancer Mother    CAD Father        MI at age 86   Stroke Father    Cancer Father        Cholangiocarcinoma   Myasthenia gravis Sister    Chiari malformation Sister    Hematuria Sister    Esophageal cancer Paternal Uncle    Goiter Maternal Grandmother    Cancer Maternal Grandfather    Lung cancer Maternal Grandfather    Rheum arthritis Paternal Grandmother    Stroke Paternal Grandfather    Kidney disease Paternal Grandfather    Alport syndrome Paternal Grandfather    Polycystic ovary syndrome Daughter        pre-diabetic    Ehlers-Danlos syndrome Daughter    Other Daughter        small fiber neuropathy   Allergies Daughter    Healthy Daughter    Healthy Son    Allergies as of 12/25/2023       Reactions   Avocado Shortness Of Breath, Nausea And Vomiting   Macadamia Nut Oil Hives, Swelling   & Walnuts. LIPS SWELL   Mango Flavoring Agent (non-screening) Swelling   SWELLING OF THE LIPS   Other Other (See Comments)   [name]   Ciprofloxacin Other (See Comments)   SEVERE JOINT PAIN   Demerol [meperidine] Other (See Comments)   HALLUCINATIONS   Iodinated Contrast Media Hives, Other (See Comments)   Patient had IVP @ age 9 and broke out in Hives (was once pre-treated with several meds)   Macrobid [nitrofurantoin] Other (See Comments)   CHEST PAIN   Betadine [povidone Iodine]    hives   Gluten Meal    Latex Swelling   Please use paper tape or nitrile gloves   Dulcolax [bisacodyl] Palpitations   Felt light headed and dizzy   Sulfamethoxazole Nausea Only,  Other (See Comments), Nausea And Vomiting   Childhood reaction - upset stomach   Tape Other (See Comments), Rash   Family allergy; this is to be avoided; tolerates paper tape        Medication List        Accurate as of December 25, 2023 11:59 PM. If you have any questions, ask your nurse or doctor.          STOP taking these medications    Methyl B-12 1000 MCG Lozg Generic drug: Methylcobalamin Stopped by: Felix Pacini   METHYL-FOLATE PO Stopped by: Felix Pacini   VITAMIN B-6 PO Stopped by: Felix Pacini       TAKE these medications    albuterol 108 (90 Base) MCG/ACT inhaler Commonly known as: VENTOLIN HFA Inhale 2 puffs into the lungs as needed for wheezing or shortness of breath.   Azelastine HCl 137 MCG/SPRAY Soln Place 1 spray into both nostrils as needed.   B COMPLEX PO Methylated b complex daily.   BROMELAIN PO Take 1 tablet by mouth. Three-four times weekly   DIGESTIVE ENZYME PO Take 1 capsule by mouth with breakfast, with  lunch, and with evening meal.   Dry Eye Relief Drops 0.2-0.2-1 % Soln Generic drug: Glycerin-Hypromellose-PEG 400 Place 1 drop into both eyes daily.   EPINEPHrine 0.3 mg/0.3 mL Soaj injection Commonly known as: EPI-PEN Inject 0.3 mg into the muscle as needed for anaphylaxis.   fexofenadine 30 MG disintegrating tablet Commonly known as: ALLEGRA ODT Take 30 mg by mouth in the morning.   ibuprofen 200 MG tablet Commonly known as: ADVIL Take 200-400 mg by mouth 2 (two) times daily as needed (pain.).   K2 PLUS D3 PO Take 1 tablet by mouth daily.   metoprolol tartrate 25 MG tablet Commonly known as: LOPRESSOR Take 0.5 tablets (12.5 mg total) by mouth 2 (two) times daily as needed (arrhythmia).   omeprazole 20 MG capsule Commonly known as: PRILOSEC Take 1 capsule (20 mg total) by mouth daily before breakfast.   polyethylene glycol 17 g packet Commonly known as: MIRALAX / GLYCOLAX Take 17 g by mouth as needed.    SYSTANE OP Place 1 drop into both eyes 4 (four) times daily as needed (dry/irritated eyes.).   tiZANidine 2 MG tablet Commonly known as: ZANAFLEX Take 0.5-1 tablets (1-2 mg total) by mouth every 6 (six) hours as needed for muscle spasms.   TYLENOL PO Take 1,000 mg by mouth 2 (two) times daily.   vitamin C 250 MG tablet Commonly known as: ASCORBIC ACID Take 250 mg by mouth daily.        All past medical history, surgical history, allergies, family history, immunizations andmedications were updated in the EMR today and reviewed under the history and medication portions of their EMR.     ROS Negative, with the exception of above mentioned in HPI   Objective:  BP 128/80   Pulse 98   Temp 98 F (36.7 C)   Wt 174 lb 9.6 oz (79.2 kg)   LMP 05/20/2014 (Approximate)   SpO2 98%   BMI 27.35 kg/m  Body mass index is 27.35 kg/m. Physical Exam Vitals and nursing note reviewed.  Constitutional:      General: She is not in acute distress.    Appearance: Normal appearance. She is normal weight. She is not ill-appearing or toxic-appearing.  HENT:     Head: Normocephalic and atraumatic.  Eyes:     General: No scleral icterus.       Right eye: No discharge.        Left eye: No discharge.     Extraocular Movements: Extraocular movements intact.     Conjunctiva/sclera: Conjunctivae normal.     Pupils: Pupils are equal, round, and reactive to light.  Skin:    Findings: Lesion (plantar wart x2 heel) present. No rash.  Neurological:     Mental Status: She is alert and oriented to person, place, and time. Mental status is at baseline.     Motor: No weakness.     Coordination: Coordination normal.     Gait: Gait normal.  Psychiatric:        Mood and Affect: Mood normal.        Behavior: Behavior normal.        Thought Content: Thought content normal.        Judgment: Judgment normal.      No results found. No results found. No results found for this or any previous visit  (from the past 24 hours).  Assessment/Plan: Emily Joseph is a 53 y.o. female present for OV for  Cold intolerance (Primary)/chills/fatigue - CBC w/Diff -  Iron, TIBC and Ferritin Panel Pt would like to make certain she is not anemic and her iron levels are normal  Vitamin D deficiency/fatigue supplementing - Vitamin D (25 hydroxy)  Plantar wart of left foot Does not appear infected today.  Keep clean and dry Discussed dermatology does have other ways of treating plantar warts with chemicals. She will discuss with them.   Reviewed expectations re: course of current medical issues. Discussed self-management of symptoms. Outlined signs and symptoms indicating need for more acute intervention. Patient verbalized understanding and all questions were answered. Patient received an After-Visit Summary.    Orders Placed This Encounter  Procedures   CBC w/Diff   Vitamin D (25 hydroxy)   Iron, TIBC and Ferritin Panel   Meds ordered this encounter  Medications   tiZANidine (ZANAFLEX) 2 MG tablet    Sig: Take 0.5-1 tablets (1-2 mg total) by mouth every 6 (six) hours as needed for muscle spasms.    Dispense:  90 tablet    Refill:  1   Referral Orders  No referral(s) requested today     Note is dictated utilizing voice recognition software. Although note has been proof read prior to signing, occasional typographical errors still can be missed. If any questions arise, please do not hesitate to call for verification.   electronically signed by:  Felix Pacini, DO  [facility] [place]

## 2023-12-26 LAB — CBC WITH DIFFERENTIAL/PLATELET
Absolute Lymphocytes: 1700 {cells}/uL (ref 850–3900)
Absolute Monocytes: 351 {cells}/uL (ref 200–950)
Basophils Absolute: 62 {cells}/uL (ref 0–200)
Basophils Relative: 0.8 %
Eosinophils Absolute: 164 {cells}/uL (ref 15–500)
Eosinophils Relative: 2.1 %
HCT: 42.1 % (ref 35.0–45.0)
Hemoglobin: 13.8 g/dL (ref 11.7–15.5)
MCH: 28.9 pg (ref 27.0–33.0)
MCHC: 32.8 g/dL (ref 32.0–36.0)
MCV: 88.3 fL (ref 80.0–100.0)
MPV: 10.6 fL (ref 7.5–12.5)
Monocytes Relative: 4.5 %
Neutro Abs: 5522 {cells}/uL (ref 1500–7800)
Neutrophils Relative %: 70.8 %
Platelets: 281 10*3/uL (ref 140–400)
RBC: 4.77 10*6/uL (ref 3.80–5.10)
RDW: 12.3 % (ref 11.0–15.0)
Total Lymphocyte: 21.8 %
WBC: 7.8 10*3/uL (ref 3.8–10.8)

## 2023-12-26 LAB — VITAMIN D 25 HYDROXY (VIT D DEFICIENCY, FRACTURES): Vit D, 25-Hydroxy: 56 ng/mL (ref 30–100)

## 2023-12-26 LAB — IRON,TIBC AND FERRITIN PANEL
%SAT: 22 % (ref 16–45)
Ferritin: 74 ng/mL (ref 16–232)
Iron: 71 ug/dL (ref 45–160)
TIBC: 317 ug/dL (ref 250–450)

## 2023-12-28 ENCOUNTER — Encounter: Payer: Self-pay | Admitting: Family Medicine
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
## 2024-01-28 ENCOUNTER — Encounter: Payer: Self-pay | Admitting: Rheumatology

## 2024-01-28 ENCOUNTER — Ambulatory Visit: Attending: Rheumatology | Admitting: Rheumatology

## 2024-01-28 VITALS — BP 125/78 | HR 75 | Resp 14 | Ht 67.5 in | Wt 171.4 lb

## 2024-01-28 DIAGNOSIS — M7711 Lateral epicondylitis, right elbow: Secondary | ICD-10-CM | POA: Diagnosis not present

## 2024-01-28 DIAGNOSIS — M25512 Pain in left shoulder: Secondary | ICD-10-CM

## 2024-01-28 DIAGNOSIS — I5189 Other ill-defined heart diseases: Secondary | ICD-10-CM

## 2024-01-28 DIAGNOSIS — M7712 Lateral epicondylitis, left elbow: Secondary | ICD-10-CM

## 2024-01-28 DIAGNOSIS — M1712 Unilateral primary osteoarthritis, left knee: Secondary | ICD-10-CM

## 2024-01-28 DIAGNOSIS — Z9109 Other allergy status, other than to drugs and biological substances: Secondary | ICD-10-CM

## 2024-01-28 DIAGNOSIS — G8929 Other chronic pain: Secondary | ICD-10-CM

## 2024-01-28 DIAGNOSIS — M7061 Trochanteric bursitis, right hip: Secondary | ICD-10-CM | POA: Diagnosis not present

## 2024-01-28 DIAGNOSIS — Z91018 Allergy to other foods: Secondary | ICD-10-CM

## 2024-01-28 DIAGNOSIS — K2100 Gastro-esophageal reflux disease with esophagitis, without bleeding: Secondary | ICD-10-CM

## 2024-01-28 DIAGNOSIS — M79641 Pain in right hand: Secondary | ICD-10-CM

## 2024-01-28 DIAGNOSIS — M792 Neuralgia and neuritis, unspecified: Secondary | ICD-10-CM

## 2024-01-28 DIAGNOSIS — M79642 Pain in left hand: Secondary | ICD-10-CM

## 2024-01-28 DIAGNOSIS — M6289 Other specified disorders of muscle: Secondary | ICD-10-CM

## 2024-01-28 DIAGNOSIS — M7062 Trochanteric bursitis, left hip: Secondary | ICD-10-CM

## 2024-01-28 DIAGNOSIS — M797 Fibromyalgia: Secondary | ICD-10-CM

## 2024-01-28 DIAGNOSIS — M25511 Pain in right shoulder: Secondary | ICD-10-CM

## 2024-01-28 DIAGNOSIS — R768 Other specified abnormal immunological findings in serum: Secondary | ICD-10-CM

## 2024-01-28 DIAGNOSIS — R5382 Chronic fatigue, unspecified: Secondary | ICD-10-CM

## 2024-01-28 DIAGNOSIS — K581 Irritable bowel syndrome with constipation: Secondary | ICD-10-CM

## 2024-01-28 DIAGNOSIS — F419 Anxiety disorder, unspecified: Secondary | ICD-10-CM

## 2024-01-28 NOTE — Patient Instructions (Addendum)
 Exercises for Chronic Knee Pain Chronic knee pain is pain that lasts longer than 3 months. For most people with chronic knee pain, exercise and weight loss is an important part of treatment. Your health care provider may want you to focus on: Making the muscles that support your knee stronger. This can take pressure off your knee and reduce pain. Preventing knee stiffness. How far you can move your knee, keeping it there or making it farther. Losing weight (if this applies) to take pressure off your knee, lower your risk for injury, and make it easier for you to exercise. Your provider will help you make an exercise program that fits your needs and physical abilities. Below are simple, low-impact exercises you can do at home. Ask your provider or physical therapist how often you should do your exercise program and how many times to repeat each exercise. General safety tips  Get your provider's approval before doing any exercises. Start slowly and stop any time you feel pain. Do not exercise if your knee pain is flaring up. Warm up first. Stretching a cold muscle can cause an injury. Do 5-10 minutes of easy movement or light stretching before beginning your exercises. Do 5-10 minutes of low-impact activity (like walking or cycling) before starting strengthening exercises. Contact your provider any time you have pain during or after exercising. Exercise can cause discomfort but should not be painful. It is normal to be a little stiff or sore after exercising. Stretching and range-of-motion exercises Front thigh stretch  Stand up straight and support your body by holding on to a chair or resting one hand on a wall. With your legs straight and close together, bend one knee to lift your heel up toward your butt. Using one hand for support, grab your ankle with your free hand. Pull your foot up closer toward your butt to feel the stretch in front of your thigh. Hold the stretch for 30  seconds. Repeat __________ times. Complete this exercise __________ times a day. Back thigh stretch  Sit on the floor with your back straight and your legs out straight in front of you. Place the palms of your hands on the floor and slide them toward your feet as you bend at the hip. Try to touch your nose to your knees and feel the stretch in the back of your thighs. Hold for 30 seconds. Repeat __________ times. Complete this exercise __________ times a day. Calf stretch  Stand facing a wall. Place the palms of your hands flat against the wall, arms extended, and lean slightly against the wall. Get into a lunge position with one leg bent at the knee and the other leg stretched out straight behind you. Keep both feet facing the wall and increase the bend in your knee while keeping the heel of the other leg flat on the ground. You should feel the stretch in your calf. Hold for 30 seconds. Repeat __________ times. Complete this exercise __________ times a day. Strengthening exercises Straight leg lift  Lie on your back with one knee bent and the other leg out straight. Slowly lift the straight leg without bending the knee. Lift until your foot is about 12 inches (30 cm) off the floor. Hold for 3-5 seconds and slowly lower your leg. Repeat __________ times. Complete this exercise __________ times a day. Single leg dip  Stand between two chairs and put both hands on the backs of the chairs for support. Extend one leg out straight with your body  weight resting on the heel of the standing leg. Slowly bend your standing knee to dip your body to the level that is comfortable for you. Hold for 3-5 seconds. Repeat __________ times. Complete this exercise __________ times a day. Hamstring curls  Stand straight, knees close together, facing the back of a chair. Hold on to the back of a chair with both hands. Keep one leg straight. Bend the other knee while bringing the heel up toward the butt  until the knee is bent at a 90-degree angle (right angle). Hold for 3-5 seconds. Repeat __________ times. Complete this exercise __________ times a day. Wall squat  Stand straight with your back, hips, and head against a wall. Step forward one foot at a time with your back still against the wall. Your feet should be 2 feet (61 cm) from the wall at shoulder width. Keeping your back, hips, and head against the wall, slide down the wall to as close to a sitting position as you can get. Hold for 5-10 seconds, then slowly slide back up. Repeat __________ times. Complete this exercise __________ times a day. Step-ups  Stand in front of a sturdy platform or stool that is about 6 inches (15 cm) high. Slowly step up with your left / right foot, keeping your knee in line with your hip and foot. Do not let your knee bend so far that you cannot see your toes. Hold on to a chair for balance, but do not use it for support. Slowly unlock your knee and lower yourself to the starting position. Repeat __________ times. Complete this exercise __________ times a day. Contact a health care provider if: Your exercises cause pain. Your pain is worse after you exercise. Your pain prevents you from doing your exercises. This information is not intended to replace advice given to you by your health care provider. Make sure you discuss any questions you have with your health care provider. Document Revised: 11/11/2022 Document Reviewed: 11/11/2022 [employer] Patient Education  2024 [employer]Iliotibial Band Syndrome Rehab Ask your health care provider which exercises are safe for you. Do exercises exactly as told by your provider and adjust them as told. It's normal to feel mild stretching, pulling, tightness, or discomfort as you do these exercises. Stop right away if you feel sudden pain or your pain gets a lot worse. Do not begin these exercises until told by your provider. Stretching and range-of-motion  exercises These exercises warm up your muscles and joints. They also improve the movement and flexibility of your hip and pelvis. Quadriceps stretch, prone  Lie face down (prone) on a firm surface like a bed or padded floor. Bend your left / right knee. Reach back to hold your ankle or pant leg. If you can't reach your ankle or pant leg, use a belt looped around your foot and grab the belt instead. Gently pull your heel toward your butt. Your knee should not slide out to the side. You should feel a stretch in the front of your thigh and knee, also called the quadriceps. Hold this position for __________ seconds. Repeat __________ times. Complete this exercise __________ times a day. Iliotibial band stretch The iliotibial band is a strip of tissue that runs along the outside of your hip down to your knee. Lie on your side with your left / right leg on top. Bend both knees and grab your left / right ankle. Stretch out your bottom arm to help you balance. Slowly bring your top knee  back so your thigh goes behind your back. Slowly lower your top leg toward the floor until you feel a gentle stretch on the outside of your left / right hip and thigh. If you don't feel a stretch and your knee won't go farther, place the heel of your other foot on top of your knee and pull your knee down toward the floor with your foot. Hold this position for __________ seconds. Repeat __________ times. Complete this exercise __________ times a day. Strengthening exercises These exercises build strength and endurance in your hip and pelvis. Endurance means your muscles can keep working even when they're tired. Straight leg raises, side-lying This exercise strengthens the muscles that rotate the leg at the hip and move it away from your body. These muscles are called hip abductors. Lie on your side with your left / right leg on top. Lie so your head, shoulder, hip, and knee line up. You can bend your bottom knee to help  you balance. Roll your hips slightly forward so they're stacked directly over each other. Your left / right knee should face forward. Tense the muscles in your outer thigh and hip. Lift your top leg 4-6 inches (10-15 cm) off the ground. Hold this position for __________ seconds. Slowly lower your leg back down to the starting position. Let your muscles fully relax before doing this exercise again. Repeat __________ times. Complete this exercise __________ times a day. Leg raises, prone This exercise strengthens the muscles that move the hips backward. These muscles are called hip extensors. Lie face down (prone) on your bed or a firm surface. You can put a pillow under your hips for comfort and to support your lower back. Bend your left / right knee so your foot points straight up toward the ceiling. Keep the other leg straight and behind you. Squeeze your butt muscles. Lift your left / right thigh off the firm surface. Do not let your back arch. Tense your thigh muscle as hard as you can without having more knee pain. Hold this position for __________ seconds. Slowly lower your leg to the starting position. Allow your leg to relax all the way. Repeat __________ times. Complete this exercise __________ times a day. Hip hike  Stand sideways on a bottom step. Place your feet so that your left / right leg is on the step, and the other foot is hanging off the side. If you need support for balance, hold onto a railing or wall. Keep your knees straight and your abdomen square, meaning your hips are level. Then, lift your left / right hip up toward the ceiling. Slowly let your leg that's hanging off the step lower towards the floor. Your foot should get closer to the ground. Do not lean or bend your knees during this movement. Repeat __________ times. Complete this exercise __________ times a day. This information is not intended to replace advice given to you by your health care provider. Make sure  you discuss any questions you have with your health care provider. Document Revised: 01/09/2023 Document Reviewed: 01/09/2023 [employer] Patient Education  2024 [employer]  Shoulder Exercises Ask your health care provider which exercises are safe for you. Do exercises exactly as told by your health care provider and adjust them as directed. It is normal to feel mild stretching, pulling, tightness, or discomfort as you do these exercises. Stop right away if you feel sudden pain or your pain gets worse. Do not begin these exercises until told by your  health care provider. Stretching exercises External rotation and abduction This exercise is sometimes called corner stretch. The exercise rotates your arm outward (external rotation) and moves your arm out from your body (abduction). Stand in a doorway with one of your feet slightly in front of the other. This is called a staggered stance. If you cannot reach your forearms to the door frame, stand facing a corner of a room. Choose one of the following positions as told by your health care provider: Place your hands and forearms on the door frame above your head. Place your hands and forearms on the door frame at the height of your head. Place your hands on the door frame at the height of your elbows. Slowly move your weight onto your front foot until you feel a stretch across your chest and in the front of your shoulders. Keep your head and chest upright and keep your abdominal muscles tight. Hold for __________ seconds. To release the stretch, shift your weight to your back foot. Repeat __________ times. Complete this exercise __________ times a day. Extension, standing  Stand and hold a broomstick, a cane, or a similar object behind your back. Your hands should be a little wider than shoulder-width apart. Your palms should face away from your back. Keeping your elbows straight and your shoulder muscles relaxed, move the stick away from your body  until you feel a stretch in your shoulders (extension). Avoid shrugging your shoulders while you move the stick. Keep your shoulder blades tucked down toward the middle of your back. Hold for __________ seconds. Slowly return to the starting position. Repeat __________ times. Complete this exercise __________ times a day. Range-of-motion exercises Pendulum  Stand near a wall or a surface that you can hold onto for balance. Bend at the waist and let your left / right arm hang straight down. Use your other arm to support you. Keep your back straight and do not lock your knees. Relax your left / right arm and shoulder muscles, and move your hips and your trunk so your left / right arm swings freely. Your arm should swing because of the motion of your body, not because you are using your arm or shoulder muscles. Keep moving your hips and trunk so your arm swings in the following directions, as told by your health care provider: Side to side. Forward and backward. In clockwise and counterclockwise circles. Continue each motion for __________ seconds, or for as long as told by your health care provider. Slowly return to the starting position. Repeat __________ times. Complete this exercise __________ times a day. Shoulder flexion, standing  Stand and hold a broomstick, a cane, or a similar object. Place your hands a little more than shoulder-width apart on the object. Your left / right hand should be palm-up, and your other hand should be palm-down. Keep your elbow straight and your shoulder muscles relaxed. Push the stick up with your healthy arm to raise your left / right arm in front of your body, and then over your head until you feel a stretch in your shoulder (flexion). Avoid shrugging your shoulder while you raise your arm. Keep your shoulder blade tucked down toward the middle of your back. Hold for __________ seconds. Slowly return to the starting position. Repeat __________ times.  Complete this exercise __________ times a day. Shoulder abduction, standing  Stand and hold a broomstick, a cane, or a similar object. Place your hands a little more than shoulder-width apart on the object. Your  left / right hand should be palm-up, and your other hand should be palm-down. Keep your elbow straight and your shoulder muscles relaxed. Push the object across your body toward your left / right side. Raise your left / right arm to the side of your body (abduction) until you feel a stretch in your shoulder. Do not raise your arm above shoulder height unless your health care provider tells you to do that. If directed, raise your arm over your head. Avoid shrugging your shoulder while you raise your arm. Keep your shoulder blade tucked down toward the middle of your back. Hold for __________ seconds. Slowly return to the starting position. Repeat __________ times. Complete this exercise __________ times a day. Internal rotation  Place your left / right hand behind your back, palm-up. Use your other hand to dangle an exercise band, a broomstick, or a similar object over your shoulder. Grasp the band with your left / right hand so you are holding on to both ends. Gently pull up on the band until you feel a stretch in the front of your left / right shoulder. The movement of your arm toward the center of your body is called internal rotation. Avoid shrugging your shoulder while you raise your arm. Keep your shoulder blade tucked down toward the middle of your back. Hold for __________ seconds. Release the stretch by letting go of the band and lowering your hands. Repeat __________ times. Complete this exercise __________ times a day. Strengthening exercises External rotation  Sit in a stable chair without armrests. Secure an exercise band to a stable object at elbow height on your left / right side. Place a soft object, such as a folded towel or a small pillow, between your left / right  upper arm and your body to move your elbow about 4 inches (10 cm) away from your side. Hold the end of the exercise band so it is tight and there is no slack. Keeping your elbow pressed against the soft object, slowly move your forearm out, away from your abdomen (external rotation). Keep your body steady so only your forearm moves. Hold for __________ seconds. Slowly return to the starting position. Repeat __________ times. Complete this exercise __________ times a day. Shoulder abduction  Sit in a stable chair without armrests, or stand up. Hold a __________ lb / kg weight in your left / right hand, or hold an exercise band with both hands. Start with your arms straight down and your left / right palm facing in, toward your body. Slowly lift your left / right hand out to your side (abduction). Do not lift your hand above shoulder height unless your health care provider tells you that this is safe. Keep your arms straight. Avoid shrugging your shoulder while you do this movement. Keep your shoulder blade tucked down toward the middle of your back. Hold for __________ seconds. Slowly lower your arm, and return to the starting position. Repeat __________ times. Complete this exercise __________ times a day. Shoulder extension  Sit in a stable chair without armrests, or stand up. Secure an exercise band to a stable object in front of you so it is at shoulder height. Hold one end of the exercise band in each hand. Straighten your elbows and lift your hands up to shoulder height. Squeeze your shoulder blades together as you pull your hands down to the sides of your thighs (extension). Stop when your hands are straight down by your sides. Do not let your hands  go behind your body. Hold for __________ seconds. Slowly return to the starting position. Repeat __________ times. Complete this exercise __________ times a day. Shoulder row  Sit in a stable chair without armrests, or stand up. Secure  an exercise band to a stable object in front of you so it is at chest height. Hold one end of the exercise band in each hand. Position your palms so that your thumbs are facing the ceiling (neutral position). Bend each of your elbows to a 90-degree angle (right angle) and keep your upper arms at your sides. Step back or move the chair back until the band is tight and there is no slack. Slowly pull your elbows back behind you. Hold for __________ seconds. Slowly return to the starting position. Repeat __________ times. Complete this exercise __________ times a day. Shoulder press-ups  Sit in a stable chair that has armrests. Sit upright, with your feet flat on the floor. Put your hands on the armrests so your elbows are bent and your fingers are pointing forward. Your hands should be about even with the sides of your body. Push down on the armrests and use your arms to lift yourself off the chair. Straighten your elbows and lift yourself up as much as you comfortably can. Move your shoulder blades down, and avoid letting your shoulders move up toward your ears. Keep your feet on the ground. As you get stronger, your feet should support less of your body weight as you lift yourself up. Hold for __________ seconds. Slowly lower yourself back into the chair. Repeat __________ times. Complete this exercise __________ times a day. Wall push-ups  Stand so you are facing a stable wall. Your feet should be about one arm-length away from the wall. Lean forward and place your palms on the wall at shoulder height. Keep your feet flat on the floor as you bend your elbows and lean forward toward the wall. Hold for __________ seconds. Straighten your elbows to push yourself back to the starting position. Repeat __________ times. Complete this exercise __________ times a day. This information is not intended to replace advice given to you by your health care provider. Make sure you discuss any questions  you have with your health care provider. Document Revised: 12/17/2021 Document Reviewed: 12/17/2021 [employer] Patient Education  2024 [employer].

## 2024-02-09 ENCOUNTER — Ambulatory Visit: Payer: BC Managed Care – PPO | Admitting: Rheumatology

## 2024-03-09 ENCOUNTER — Encounter: Payer: Self-pay | Admitting: Family Medicine

## 2024-03-14 ENCOUNTER — Ambulatory Visit (INDEPENDENT_AMBULATORY_CARE_PROVIDER_SITE_OTHER): Admitting: Family Medicine

## 2024-03-14 ENCOUNTER — Encounter: Payer: Self-pay | Admitting: Family Medicine

## 2024-03-14 VITALS — BP 124/82 | HR 76 | Temp 98.0°F | Wt 174.8 lb

## 2024-03-14 DIAGNOSIS — E663 Overweight: Secondary | ICD-10-CM | POA: Insufficient documentation
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
## 2024-04-25 ENCOUNTER — Encounter: Payer: Self-pay | Admitting: Cardiology

## 2024-04-25 DIAGNOSIS — R002 Palpitations: Secondary | ICD-10-CM

## 2024-04-25 MED ORDER — METOPROLOL TARTRATE 25 MG PO TABS: 12.5000 mg | ORAL_TABLET | Freq: Two times a day (BID) | ORAL | Status: DC | PRN

## 2024-04-25 NOTE — Telephone Encounter (Signed)
 Per last ov note from 01/26/2023, Occasional palpitations but she takes Toprol  as needed and this is unchanged.   The medication list lists Lopressor .  Will route to Dr. Audery Blazing for clarification so that appropriate prescription can be sent it.

## 2024-05-09 MED ORDER — METOPROLOL TARTRATE 25 MG PO TABS: 12.5000 mg | ORAL_TABLET | Freq: Two times a day (BID) | ORAL | 0 refills | Status: DC | PRN

## 2024-05-09 NOTE — Addendum Note (Signed)
 Addended by: BLUFORD RAMP D on: 05/09/2024 02:01 PM   Modules accepted: Orders

## 2024-05-09 NOTE — Addendum Note (Signed)
 Addended by: BLUFORD RAMP D on: 05/09/2024 02:03 PM   Modules accepted: Orders

## 2024-06-10 ENCOUNTER — Encounter: Payer: Self-pay | Admitting: Cardiology

## 2024-06-13 ENCOUNTER — Encounter: Payer: Self-pay | Admitting: Internal Medicine

## 2024-06-17 ENCOUNTER — Other Ambulatory Visit: Payer: Self-pay | Admitting: Neurosurgery

## 2024-06-17 DIAGNOSIS — M542 Cervicalgia: Secondary | ICD-10-CM

## 2024-06-22 ENCOUNTER — Encounter: Payer: Self-pay | Admitting: Neurosurgery

## 2024-06-29 ENCOUNTER — Ambulatory Visit: Attending: Neurosurgery

## 2024-06-29 ENCOUNTER — Other Ambulatory Visit: Payer: Self-pay

## 2024-06-29 DIAGNOSIS — M62838 Other muscle spasm: Secondary | ICD-10-CM | POA: Insufficient documentation

## 2024-06-29 DIAGNOSIS — R279 Unspecified lack of coordination: Secondary | ICD-10-CM | POA: Diagnosis present

## 2024-06-29 DIAGNOSIS — M6281 Muscle weakness (generalized): Secondary | ICD-10-CM | POA: Insufficient documentation

## 2024-06-29 DIAGNOSIS — R293 Abnormal posture: Secondary | ICD-10-CM | POA: Insufficient documentation

## 2024-06-29 DIAGNOSIS — M542 Cervicalgia: Secondary | ICD-10-CM | POA: Insufficient documentation

## 2024-06-29 NOTE — Therapy (Signed)
 OUTPATIENT PHYSICAL THERAPY CERVICAL EVALUATION   Patient Name: Emily Joseph MRN: [id number] DOB:[birth year], 53 y.o., female Today's Date: 06/29/2024  END OF SESSION:  PT End of Session - 06/29/24 1325     Visit Number 1    Date for PT Re-Evaluation 08/24/24    Authorization Type [employer] -no auth required    PT Start Time 1019    PT Stop Time 1108    PT Time Calculation (min) 49 min    Activity Tolerance Patient tolerated treatment well    Behavior During Therapy WFL for tasks assessed/performed          Past Medical History:  Diagnosis Date   Adenomyosis    not papanicolaou smear of cervix and cervical HPV   Allergy    Anxiety 06/08/2016   -about her health and about taking medications   Arthritis    ?   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 Ishmael in 2017, [place] rheum -seeing Dr. Ines, Neurologist for back pain with radicular symptoms   Complication of anesthesia    with epidural bp bottoms out   Dysrhythmia    fast hr   Eosinophilic esophagitis    Sees Dr. Avram   Fibromyalgia    GERD (gastroesophageal reflux disease)    H/O laminectomy 09/07/2020   Heart murmur    History of atrial flutter 10/2016   spontaneous conversion to sinus   History of gallstones    History of kidney stones    History of laparoscopic-assisted vaginal hysterectomy 09/07/2020   History of pneumonia    when pt was pregnant   History of pneumothorax 07/2016   Right   History of sinus tachycardia    Iron deficiency anemia due to chronic blood loss    Menorrhagia, s/p complete hysterectomy, ovaries remain hx   Irritable bowel syndrome 01/27/2013   Dr Avram 02/2016    Microhematuria    Mouth problem    failed gum graft 1/16   Nasal airway abnormality    Nasal  obstruction 06/26/2015   Seen by ENT 06-26-15.  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   Shortness of breath dyspnea    SUI (stress urinary incontinence, female)    Syncope    with last child with epideral   UTI (urinary tract infection) 07/14/2016   >=100,000 COLONIES/mL KLEBSIELLA PNEUMONIA   Vasovagal episode    post eidural   Vitamin D  deficiency    Past Surgical History:  Procedure Laterality Date   ABDOMINAL HYSTERECTOMY     APPENDECTOMY  01/1989   BALLOON DILATION N/A 02/09/2013   Procedure: BALLOON DILATION;  Surgeon: Elsie Cree, MD;  Location: [facility];  Service: Endoscopy;  Laterality: N/A;   BILATERAL SALPINGECTOMY N/A 06/14/2014   Procedure: BILATERAL SALPINGECTOMY;  Surgeon: Lynwood FORBES Curlene PONCE, MD;  Location: [facility];  Service: Gynecology;  Laterality: N/A;   BIOPSY  09/04/2021   Procedure: BIOPSY;  Surgeon: Avram Lupita FORBES, MD;  Location: [facility];  Service: Endoscopy;;   BIOPSY  05/07/2023  Procedure: BIOPSY;  Surgeon: Avram Lupita BRAVO, MD;  Location: [facility];  Service: Gastroenterology;;   BIOPSY  09/24/2023   Procedure: BIOPSY;  Surgeon: Avram Lupita BRAVO, MD;  Location: [facility];  Service: Gastroenterology;;   BREAST BIOPSY Right    biopsy    CHOLECYSTECTOMY N/A 11/21/2019   Procedure: LAPAROSCOPIC CHOLECYSTECTOMY WITH INTRAOPERATIVE CHOLANGIOGRAM;  Surgeon: Ethyl Lenis, MD;  Location: [facility];  Service: General;  Laterality: N/A;   COLONOSCOPY WITH PROPOFOL  N/A 02/09/2013   Procedure: COLONOSCOPY WITH PROPOFOL ;  Surgeon: Elsie Cree, MD;  Location: [facility];  Service: Endoscopy;  Laterality: N/A;  need ultra thin colon scope   COLONOSCOPY WITH PROPOFOL  N/A 05/07/2023   Procedure: COLONOSCOPY WITH PROPOFOL ;  Surgeon: Avram Lupita BRAVO, MD;  Location: [facility];   Service: Gastroenterology;  Laterality: N/A;   ESOPHAGEAL DILATION  09/04/2021   Procedure: ESOPHAGEAL DILATION;  Surgeon: Avram Lupita BRAVO, MD;  [facility]: [facility];  Service: Endoscopy;;   ESOPHAGEAL DILATION  05/07/2023   Procedure: ESOPHAGEAL DILATION;  Surgeon: Avram Lupita BRAVO, MD;  [facility]: [facility];  Service: Gastroenterology;;   ESOPHAGOGASTRODUODENOSCOPY Left 06/30/2021   Procedure: ESOPHAGOGASTRODUODENOSCOPY (EGD);  Surgeon: San Sandor GAILS, DO;  Location: [facility];  Service: Gastroenterology;  Laterality: Left;   ESOPHAGOGASTRODUODENOSCOPY (EGD) WITH PROPOFOL  N/A 02/09/2013   Procedure: ESOPHAGOGASTRODUODENOSCOPY (EGD) WITH PROPOFOL ;  Surgeon: Elsie Cree, MD;  [facility];  Service: Endoscopy;  Laterality: N/A;   ESOPHAGOGASTRODUODENOSCOPY (EGD) WITH PROPOFOL  N/A 09/04/2021   Procedure: ESOPHAGOGASTRODUODENOSCOPY (EGD) WITH PROPOFOL ;  Surgeon: Avram Lupita BRAVO, MD;  [facility]: [facility];  Service: Endoscopy;  Laterality: N/A;   ESOPHAGOGASTRODUODENOSCOPY (EGD) WITH PROPOFOL  N/A 05/07/2023   Procedure: ESOPHAGOGASTRODUODENOSCOPY (EGD) WITH PROPOFOL ;  Surgeon: Avram Lupita BRAVO, MD;  [facility]: [facility];  Service: Gastroenterology;  Laterality: N/A;   ESOPHAGOGASTRODUODENOSCOPY (EGD) WITH PROPOFOL  N/A 09/24/2023   Procedure: ESOPHAGOGASTRODUODENOSCOPY (EGD) WITH PROPOFOL ;  Surgeon: Avram Lupita BRAVO, MD;  Location: [facility];  Service: Gastroenterology;  Laterality: N/A;   FOREIGN BODY REMOVAL  06/30/2021   Procedure: FOREIGN BODY REMOVAL;  Surgeon: San Sandor GAILS, DO;  Location: [facility];  Service: Gastroenterology;;   LAPAROSCOPIC ASSISTED VAGINAL HYSTERECTOMY Right 06/14/2014   Procedure: OPEN LAPAROSCOPIC ASSISTED VAGINAL HYSTERECTOMY;  Surgeon: Lynwood BRAVO Curlene PONCE, MD;  Location: [facility];  Service: Gynecology;  Laterality: Right;   LYSIS OF ADHESION N/A 06/14/2014   Procedure: LYSIS OF ADHESION;  Surgeon: Lynwood BRAVO Curlene PONCE, MD;  Location: [facility];   Service: Gynecology;  Laterality: N/A;   MINIMALLY INVASIVE EXCISION OF ATRIAL MYXOMA Right 07/11/2016   Procedure: MINIMALLY INVASIVE RESECTION OF RIGHT ATRIAL LIPOMA WITH CLOSURE OF PATENT FORAMEN OVALE;  Surgeon: Sudie VEAR Laine, MD;  Location: [facility];  Service: Open Heart Surgery;  Laterality: Right;   OVARIAN CYST REMOVAL Right 1990   TEE WITHOUT CARDIOVERSION N/A 07/11/2016   Procedure: TRANSESOPHAGEAL ECHOCARDIOGRAM (TEE);  Surgeon: Sudie VEAR Laine, MD;  [facility];  Service: Open Heart Surgery;  Laterality: N/A;   Patient Active Problem List   Diagnosis Date Noted   Overweight (BMI 25.0-29.9) 03/14/2024   Plantar wart of left foot 12/25/2023   Esophageal dysphagia 05/07/2023   Esophageal stricture 05/07/2023   Colon cancer screening 05/07/2023   Pain in right hip 03/12/2022   Esophageal web    Female stress incontinence 09/07/2020   Heart disease 09/07/2020   Primary osteoarthritis of left knee 05/31/2020   At high risk for breast cancer 08/30/2019   Pelvic floor dysfunction 12/28/2018   Chronic RUQ pain -  exacerbation 12/28/2018   Atrial flutter with rapid ventricular response (HCC) 10/27/2016   s/p minimally invasive resection of right atrial lipoma    Chronic pain 06/08/2016   Chronic fatigue 06/08/2016   GERD (gastroesophageal reflux disease) with esophagitis 06/08/2016   Palpitations 06/08/2016   Anxiety 06/08/2016   Multiple environmental allergies 03/04/2016   Multiple food allergies 03/04/2016   Irritable bowel syndrome with constipation 01/27/2013   Fibromyalgia 01/07/2007    PCP: Charlies Bellini DO  REFERRING PROVIDER: Arley Helling, MD  REFERRING DIAG: Cervicalgia  THERAPY DIAG:  Muscle weakness (generalized) - Plan: PT plan of care cert/re-cert  Unspecified lack of coordination - Plan: PT plan of care cert/re-cert  Abnormal posture - Plan: PT plan of care cert/re-cert  Other muscle spasm - Plan: PT plan of care cert/re-cert  Neck pain - Plan: PT  plan of care cert/re-cert  Rationale for Evaluation and Treatment: Rehabilitation  ONSET DATE: June 2025  SUBJECTIVE:                                                                                                                                                                                                         SUBJECTIVE STATEMENT: Pt back in June 2025 couldn't turn her head no matter how she tried. She saw doctor, who diagnosed her for having arthritis in her neck and DDD. She wanted to see a specialist in addition to seeing her doctor due to the nerve issues she is also having in the left arm. Pt to have MRI on July 06, 2024. Pt reports having burning feeling right now in left arm. She wants to be treated not just for the neck but the whole spine. Her consultation follow up for the MRI is Sept 9.   Hand dominance: Ambidextrous  PERTINENT HISTORY:  Hysterectomy, gave birth to 4 children, Anemia, Fibromyalgia, Ehlers-Danlos Syn, High risk for breast cancer, pelvic floor dysfunction, Cholecystectomy   PAIN:  Are you having pain? Yes: NPRS scale: 6-7/10 Pain location: outside of right neck, left shoulder radiating down arm  Pain description: catching,locking, sharp, stiff, tight, sore Aggravating factors: Turning head while driving, laying flat,has to put a pillow under forearms in supine position  Relieving factors: Massaging, compression, heat,   PRECAUTIONS: None  RED FLAGS: None     WEIGHT BEARING RESTRICTIONS: No  FALLS:  Has patient fallen in last 6 months? 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  DIAGNOSTIC FINDINGS:    PATIENT SURVEYS:  NDI:  NECK DISABILITY INDEX  Date: 06/29/24 Score  Pain intensity 1 = The pain is very mild at the moment  2. Personal care (washing, dressing, etc.) 2 = It is painful to look after myself and I am slow and careful  3. Lifting 5 = I cannot lift or carry anything   4. Reading 3 = I can't read as much as I want because of moderate pain in my neck  5. Headaches 3 = I have moderate headaches, which come frequently  6. Concentration 3 = I have a lot of difficulty in concentrating when I want to  7. Work 2 = I can do most of my usual work, but no more  8. Driving 3 = I can't drive my car as long as I want because of moderate pain in my neck  9. Sleeping 3 =  My sleep is moderately disturbed (2-3 hrs sleepless)  10. Recreation 3 = I am able to engage in a few of my usual recreation activities because of pain in   my neck  Total 28/50   Minimum Detectable Change (90% confidence): 5 points or 10% points  COGNITION: Overall cognitive status: Within functional limits for tasks assessed  SENSATION: Light touch: WFL  POSTURE: rounded shoulders, forward head, and increased thoracic kyphosis  PALPATION: Tenderness at Right upper trap, tenderness in thoracic spine, muscle tightness in suboccipital muscles   CERVICAL ROM:   Active ROM *=pain AROM (deg) eval  Flexion 40  Extension 38*  Right lateral flexion 35*  Left lateral flexion 30  Right rotation 49*  Left rotation 60   (Blank rows = not tested)  UPPER EXTREMITY ROM:  Active ROM Right eval Left eval  Shoulder flexion Dec ROM Dec ROM  Shoulder extension [facility]  Shoulder abduction Dec ROM Dec ROM  Shoulder adduction    Shoulder extension    Shoulder internal rotation Dec ROM Dec ROM  Shoulder external rotation Dec ROM Dec ROM    (Blank rows = not tested)  UPPER EXTREMITY  MMT:  MMT Right eval Left eval  Shoulder flexion 4- 4-  Shoulder extension 3+ 3+  Shoulder abduction 4- 4-  Shoulder adduction 4 4  Shoulder extension 3+ 3+  Shoulder internal rotation 4- 4-  Shoulder external rotation 4+ 4+  Middle trapezius 3+ 3+  Lower trapezius 3+ 3+   (Blank rows = not tested)  CERVICAL SPECIAL TESTS:  Upper limb tension test (ULTT): Positive  FUNCTIONAL TESTS:    TREATMENT DATE:  06/29/24 Findings from evaluation discussed, pt educated on plan of care, HEP initiated.    SCM stretch x20 sec bil Lev scap stretch x20 sec bil Pec stretch at door frame x20 sec staggered position  PATIENT EDUCATION:  Education details: HEP, importance of flexibility, strength and manual  Person educated: Patient Education method: Explanation, Demonstration, Tactile cues, Verbal cues, and Handouts Education comprehension: verbalized understanding, returned demonstration, verbal cues required, and tactile cues required  HOME EXERCISE PROGRAM: Access Code: [email or web address] URL: [email or web address] Date: 06/29/2024 Prepared by: Burnard  Exercises - Doorway Pec Stretch at 90 Degrees Abduction  - 3 x daily - 7 x weekly - 1 sets - 3 reps - 20 hold - Seated Scalenes Stretch  - 3 x daily - 7 x weekly - 1 sets - 3 reps - 20 hold - Gentle Levator Scapulae Stretch  - 3 x daily - 7 x weekly - 1 sets - 3 reps - 20 hold  ASSESSMENT:  CLINICAL IMPRESSION: Patient is a 53 y.o. female who was seen today for physical therapy evaluation and treatment for cervicalgia and bilateral shoulder pain. Pt states her main goal is to calm down the shoulders/ decrease shoulder pain, so her neck won't hurt as much when moving it; also wants myofascial release/ STM. Pt has neck MRI August 27 and follow up Sept 9. Discussed dry needling and pt is possibly interested in receiving DN  treatments. Pt complains of radiating pain and numbness down her left arm consistent with C6,C7 nerve root pattern, and demonstrated diminished sensation at palmer aspect of left fourth digit. STM demonstrated significant muscle tightness in the suboccipitals. Pt given HEP print out for stretching neck and shoulders. Pt will benefit from continuing skilled PT to address the deficits below.   OBJECTIVE IMPAIRMENTS: Abnormal gait, decreased activity tolerance, decreased balance, decreased coordination, decreased endurance, decreased knowledge of condition, decreased mobility, decreased ROM, decreased strength, decreased safety awareness, increased fascial restrictions, increased muscle spasms, impaired flexibility, impaired sensation, impaired UE functional use, improper body mechanics, postural dysfunction, and pain.   ACTIVITY LIMITATIONS: carrying, lifting, bending, standing, squatting, sleeping, stairs, transfers, bed mobility, bathing, dressing, reach over head, and caring for others  PARTICIPATION LIMITATIONS: meal prep, cleaning, laundry, driving, and occupation  PERSONAL FACTORS: Behavior pattern, Past/current experiences, and 1-2 comorbidities: fibromyalgia are also affecting patient's functional outcome.   REHAB POTENTIAL: Good  CLINICAL DECISION MAKING: Evolving/moderate complexity  EVALUATION COMPLEXITY: Moderate   GOALS: Goals reviewed with patient? Yes  SHORT TERM GOALS: Target date: 07/29/24  Pt will be independent with initial HEP so she is able to complete exercises on her own.  Baseline:  Goal status: INITIAL  2.  Pt will be able to read for longer than 30 min at a time without neck pain so she can continue participating in hobbies she enjoys.  Baseline:  Goal status: INITIAL  3.  Pt will have decreased NDI score < or = to 18/50 she increases her functional capacity for work.  Baseline: 28/50 Goal status: INITIAL    LONG TERM GOALS: Target date: 08/26/24  Pt will  demonstrate competency with her advanced HEP so she can remain independent at home and after PT discharge.  Baseline:  Goal status: INITIAL  2.  Pt will have increased shoulder ROM to be able to wash her hair, shower, and dress in a timely manner without difficulty.  Baseline: Decreased normal ROM Goal status: INITIAL  3.  Pt will have decrease in headache frequency each month, having only 1-2 instead of 10 so she has higher cognitive functional capacity for work.  Baseline: 10 headaches monthly Goal status: INITIAL  4.  Patient will increase cervical ROM into Rt rotation by 15 deg or more, without  pain so she is able to drive as long as she needs to take care of her Aunt, and her children.  Baseline:  Goal status: INITIAL  5.  Pt will be able to sleep through the night without disturbance of neck/shoulder pain waking her up so she is well rested for daily activities.  Baseline: 2-3 hours of sleeplessness Goal status: INITIAL  6.  Pt will have 4+/5 MMT strength in bilateral shoulders so she is able to lift heavy objects without pain or difficulty.  Baseline:  Goal status: INITIAL   PLAN:  PT FREQUENCY: 1-2x/week  PT DURATION: 8 weeks  PLANNED INTERVENTIONS: 97164- PT Re-evaluation, 97750- Physical Performance Testing, 97110-Therapeutic exercises, 97530- Therapeutic activity, [id number]- Neuromuscular re-education, 97535- Self Care, [id number]- Manual therapy, [id number]- Gait training, [phone]- Orthotic/Prosthetic subsequent, [phone]- Canalith repositioning, [id number]- Aquatic Therapy, [phone]- Splinting, [id number]- Electrical stimulation (unattended), [id number]- Ultrasound, [id number]- Traction (mechanical), [id number]- Ionotophoresis 4mg /ml Dexamethasone , [id number]- Parrafin, Patient/Family education, Balance training, Taping, Joint mobilization, Spinal mobilization, Scar mobilization, Compression bandaging, Vestibular training, Visual/preceptual remediation/compensation, DME instructions, and Cryotherapy  PLAN FOR NEXT  SESSION: ULTT tension test Median n to see if she also needs median nerve glides, stretches, AROM only for shoulder retraction, rotator cuff muscle strengthening, STM to upper traps, Suboccipitals, pec major and minor, Ulnar N glides.  Pt specifically asked for myofascial release so incorporate manual into treatment at first.  She will consider dry needling as well.    Lavanda Cleverly, SPT 06/29/24 3:06 PM I agree with the following treatment note after reviewing documentation. This session was performed under the supervision of a licensed clinician. Burnard Joy, PT 06/29/24 3:06 PM

## 2024-07-05 ENCOUNTER — Ambulatory Visit: Admitting: Rehabilitative and Restorative Service Providers"

## 2024-07-05 ENCOUNTER — Encounter: Payer: Self-pay | Admitting: Rehabilitative and Restorative Service Providers"

## 2024-07-05 DIAGNOSIS — R279 Unspecified lack of coordination: Secondary | ICD-10-CM

## 2024-07-05 DIAGNOSIS — M6281 Muscle weakness (generalized): Secondary | ICD-10-CM | POA: Diagnosis not present

## 2024-07-05 DIAGNOSIS — M542 Cervicalgia: Secondary | ICD-10-CM

## 2024-07-05 DIAGNOSIS — M62838 Other muscle spasm: Secondary | ICD-10-CM

## 2024-07-05 DIAGNOSIS — R293 Abnormal posture: Secondary | ICD-10-CM

## 2024-07-05 NOTE — Therapy (Signed)
 OUTPATIENT PHYSICAL THERAPY TREATMENT NOTE   Patient Name: Emily Joseph MRN: [id number] DOB:03/07/1971, 53 y.o., female Today's Date: 07/05/2024  END OF SESSION:  PT End of Session - 07/05/24 1242     Visit Number 2    Date for PT Re-Evaluation 08/24/24    Authorization Type [employer] -no auth required    PT Start Time 1230    PT Stop Time 1310    PT Time Calculation (min) 40 min    Activity Tolerance Patient limited by pain    Behavior During Therapy Anxious          Past Medical History:  Diagnosis Date   Adenomyosis    not papanicolaou smear of cervix and cervical HPV   Allergy    Anxiety 06/08/2016   -about her health and about taking medications   Arthritis    ?   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 Ishmael in 2017, [place] rheum -seeing Dr. Ines, Neurologist for back pain with radicular symptoms   Complication of anesthesia    with epidural bp bottoms out   Dysrhythmia    fast hr   Eosinophilic esophagitis    Sees Dr. Avram   Fibromyalgia    GERD (gastroesophageal reflux disease)    H/O laminectomy 09/07/2020   Heart murmur    History of atrial flutter 10/2016   spontaneous conversion to sinus   History of gallstones    History of kidney stones    History of laparoscopic-assisted vaginal hysterectomy 09/07/2020   History of pneumonia    when pt was pregnant   History of pneumothorax 07/2016   Right   History of sinus tachycardia    Iron deficiency anemia due to chronic blood loss    Menorrhagia, s/p complete hysterectomy, ovaries remain hx   Irritable bowel syndrome 01/27/2013   Dr Avram 02/2016    Microhematuria    Mouth problem    failed gum graft 1/16   Nasal airway abnormality    Nasal obstruction 06/26/2015   Seen by ENT  06-26-15.  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   Shortness of breath dyspnea    SUI (stress urinary incontinence, female)    Syncope    with last child with epideral   UTI (urinary tract infection) 07/14/2016   >=100,000 COLONIES/mL KLEBSIELLA PNEUMONIA   Vasovagal episode    post eidural   Vitamin D  deficiency    Past Surgical History:  Procedure Laterality Date   ABDOMINAL HYSTERECTOMY     APPENDECTOMY  01/1989   BALLOON DILATION N/A 02/09/2013   Procedure: BALLOON DILATION;  Surgeon: Elsie Cree, MD;  Location: [facility];  Service: Endoscopy;  Laterality: N/A;   BILATERAL SALPINGECTOMY N/A 06/14/2014   Procedure: BILATERAL SALPINGECTOMY;  Surgeon: Lynwood FORBES Curlene PONCE, MD;  Location: [facility];  Service: Gynecology;  Laterality: N/A;   BIOPSY  09/04/2021   Procedure: BIOPSY;  Surgeon: Avram Lupita FORBES, MD;  Location: [facility];  Service: Endoscopy;;   BIOPSY  05/07/2023   Procedure:  BIOPSY;  Surgeon: Avram Lupita BRAVO, MD;  Location: [facility];  Service: Gastroenterology;;   BIOPSY  09/24/2023   Procedure: BIOPSY;  Surgeon: Avram Lupita BRAVO, MD;  Location: [facility];  Service: Gastroenterology;;   BREAST BIOPSY Right    biopsy    CHOLECYSTECTOMY N/A 11/21/2019   Procedure: LAPAROSCOPIC CHOLECYSTECTOMY WITH INTRAOPERATIVE CHOLANGIOGRAM;  Surgeon: Ethyl Lenis, MD;  Location: [facility];  Service: General;  Laterality: N/A;   COLONOSCOPY WITH PROPOFOL  N/A 02/09/2013   Procedure: COLONOSCOPY WITH PROPOFOL ;  Surgeon: Elsie Cree, MD;  Location: [facility];  Service: Endoscopy;  Laterality: N/A;  need ultra thin colon scope   COLONOSCOPY WITH PROPOFOL  N/A 05/07/2023   Procedure: COLONOSCOPY WITH PROPOFOL ;  Surgeon: Avram Lupita BRAVO, MD;  [facility]: [facility];  Service: Gastroenterology;  Laterality:  N/A;   ESOPHAGEAL DILATION  09/04/2021   Procedure: ESOPHAGEAL DILATION;  Surgeon: Avram Lupita BRAVO, MD;  [facility]: [facility];  Service: Endoscopy;;   ESOPHAGEAL DILATION  05/07/2023   Procedure: ESOPHAGEAL DILATION;  Surgeon: Avram Lupita BRAVO, MD;  [facility]: [facility];  Service: Gastroenterology;;   ESOPHAGOGASTRODUODENOSCOPY Left 06/30/2021   Procedure: ESOPHAGOGASTRODUODENOSCOPY (EGD);  Surgeon: San Sandor GAILS, DO;  Location: [facility];  Service: Gastroenterology;  Laterality: Left;   ESOPHAGOGASTRODUODENOSCOPY (EGD) WITH PROPOFOL  N/A 02/09/2013   Procedure: ESOPHAGOGASTRODUODENOSCOPY (EGD) WITH PROPOFOL ;  Surgeon: Elsie Cree, MD;  [facility];  Service: Endoscopy;  Laterality: N/A;   ESOPHAGOGASTRODUODENOSCOPY (EGD) WITH PROPOFOL  N/A 09/04/2021   Procedure: ESOPHAGOGASTRODUODENOSCOPY (EGD) WITH PROPOFOL ;  Surgeon: Avram Lupita BRAVO, MD;  [facility];  Service: Endoscopy;  Laterality: N/A;   ESOPHAGOGASTRODUODENOSCOPY (EGD) WITH PROPOFOL  N/A 05/07/2023   Procedure: ESOPHAGOGASTRODUODENOSCOPY (EGD) WITH PROPOFOL ;  Surgeon: Avram Lupita BRAVO, MD;  [facility]: [facility];  Service: Gastroenterology;  Laterality: N/A;   ESOPHAGOGASTRODUODENOSCOPY (EGD) WITH PROPOFOL  N/A 09/24/2023   Procedure: ESOPHAGOGASTRODUODENOSCOPY (EGD) WITH PROPOFOL ;  Surgeon: Avram Lupita BRAVO, MD;  [facility]: [facility];  Service: Gastroenterology;  Laterality: N/A;   FOREIGN BODY REMOVAL  06/30/2021   Procedure: FOREIGN BODY REMOVAL;  Surgeon: San Sandor GAILS, DO;  Location: [facility];  Service: Gastroenterology;;   LAPAROSCOPIC ASSISTED VAGINAL HYSTERECTOMY Right 06/14/2014   Procedure: OPEN LAPAROSCOPIC ASSISTED VAGINAL HYSTERECTOMY;  Surgeon: Lynwood BRAVO Curlene PONCE, MD;  Location: [facility];  Service: Gynecology;  Laterality: Right;   LYSIS OF ADHESION N/A 06/14/2014   Procedure: LYSIS OF ADHESION;  Surgeon: Lynwood BRAVO Curlene PONCE, MD;  Location: [facility];  Service: Gynecology;  Laterality: N/A;    MINIMALLY INVASIVE EXCISION OF ATRIAL MYXOMA Right 07/11/2016   Procedure: MINIMALLY INVASIVE RESECTION OF RIGHT ATRIAL LIPOMA WITH CLOSURE OF PATENT FORAMEN OVALE;  Surgeon: Sudie VEAR Laine, MD;  Location: [facility];  Service: Open Heart Surgery;  Laterality: Right;   OVARIAN CYST REMOVAL Right 1990   TEE WITHOUT CARDIOVERSION N/A 07/11/2016   Procedure: TRANSESOPHAGEAL ECHOCARDIOGRAM (TEE);  Surgeon: Sudie VEAR Laine, MD;  Location: [facility];  Service: Open Heart Surgery;  Laterality: N/A;   Patient Active Problem List   Diagnosis Date Noted   Overweight (BMI 25.0-29.9) 03/14/2024   Plantar wart of left foot 12/25/2023   Esophageal dysphagia 05/07/2023   Esophageal stricture 05/07/2023   Colon cancer screening 05/07/2023   Pain in right hip 03/12/2022   Esophageal web    Female stress incontinence 09/07/2020   Heart disease 09/07/2020   Primary osteoarthritis of left knee 05/31/2020   At high risk for breast cancer 08/30/2019   Pelvic floor dysfunction 12/28/2018   Chronic RUQ pain -  exacerbation 12/28/2018   Atrial flutter with rapid ventricular response (HCC) 10/27/2016   s/p minimally invasive resection of right atrial lipoma    Chronic pain 06/08/2016   Chronic fatigue 06/08/2016   GERD (gastroesophageal reflux disease) with esophagitis 06/08/2016   Palpitations 06/08/2016   Anxiety 06/08/2016   Multiple environmental allergies 03/04/2016   Multiple food allergies 03/04/2016   Irritable bowel syndrome with constipation 01/27/2013   Fibromyalgia 01/07/2007    PCP: Charlies Bellini DO  REFERRING PROVIDER: Arley Helling, MD  REFERRING DIAG: Cervicalgia  THERAPY DIAG:  Muscle weakness (generalized)  Unspecified lack of coordination  Abnormal posture  Other muscle spasm  Neck pain  Rationale for Evaluation and Treatment: Rehabilitation  ONSET DATE: June 2025  SUBJECTIVE:                                                                                                                                                                                                          SUBJECTIVE STATEMENT: Patient states that she had a lot of increased pain last night and her husband had to massage her to help release the spasms.  She states that she wants to focus on myofascial, as she is having such increased pain.  Patient has a MRI on 07/06/24 for cervical region with her consultation follow up for the MRI is Sept 9.   Hand dominance: Ambidextrous  PERTINENT HISTORY:  Hysterectomy, gave birth to 4 children, Anemia, Fibromyalgia, Ehlers-Danlos Syn, High risk for breast cancer, pelvic floor dysfunction, Cholecystectomy   PAIN:  Are you having pain? Yes: NPRS scale: currently 3-5/10, last night 7-8/10 Pain location: outside of right neck, left shoulder radiating down arm  Pain description: catching,locking, sharp, stiff, tight, sore Aggravating factors: Turning head while driving, laying flat,has to put a pillow under forearms in supine position  Relieving factors: Massaging, compression, heat,   PRECAUTIONS: None  RED FLAGS: None     WEIGHT BEARING RESTRICTIONS: No  FALLS:  Has patient fallen in last 6 months? 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  DIAGNOSTIC FINDINGS:  PATIENT SURVEYS:  NDI:  NECK DISABILITY INDEX  Date: 06/29/24 Score  Pain intensity 1 = The pain is very mild at the moment  2. Personal care (washing, dressing, etc.) 2 = It is painful to look after myself and I am slow  and careful  3. Lifting 5 = I cannot lift or carry anything   4. Reading 3 = I can't read as much as I want because of moderate pain in my neck  5. Headaches 3 = I have moderate headaches, which come frequently  6. Concentration 3 = I have a lot of difficulty in concentrating when I want to  7. Work 2 = I can do most of my usual work, but no more  8. Driving 3 = I can't drive my car as long as I want because of moderate pain in my neck  9. Sleeping 3 =  My sleep is moderately disturbed (2-3 hrs sleepless)  10. Recreation 3 = I am able to engage in a few of my usual recreation activities because of pain in   my neck  Total 28/50 = 56%   Minimum Detectable Change (90% confidence): 5 points or 10% points  COGNITION: Overall cognitive status: Within functional limits for tasks assessed  SENSATION: Light touch: WFL  POSTURE: rounded shoulders, forward head, and increased thoracic kyphosis  PALPATION: Tenderness at Right upper trap, tenderness in thoracic spine, muscle tightness in suboccipital muscles   CERVICAL ROM:   Active ROM *=pain AROM (deg) eval  Flexion 40  Extension 38*  Right lateral flexion 35*  Left lateral flexion 30  Right rotation 49*  Left rotation 60   (Blank rows = not tested)  UPPER EXTREMITY ROM:  Active ROM Right eval Left eval  Shoulder flexion Dec ROM Dec ROM  Shoulder extension [facility]  Shoulder abduction Dec ROM Dec ROM  Shoulder adduction    Shoulder extension    Shoulder internal rotation Dec ROM Dec ROM  Shoulder external rotation Dec ROM Dec ROM    (Blank rows = not tested)  UPPER EXTREMITY MMT:  MMT Right eval Left eval  Shoulder flexion 4- 4-  Shoulder extension 3+ 3+  Shoulder abduction 4- 4-  Shoulder adduction 4 4  Shoulder extension 3+ 3+  Shoulder internal rotation 4- 4-  Shoulder external rotation 4+ 4+  Middle trapezius 3+ 3+  Lower trapezius 3+ 3+   (Blank rows = not tested)  CERVICAL SPECIAL TESTS:  Upper limb  tension test (ULTT): Positive  FUNCTIONAL TESTS:    TREATMENT DATE:  07/05/2024 Seated green pball rollout within comfortable range x10 Review of HEP Manual therapy:  started in prone and worked on soft tissue mobilization and gentle manual trigger point release to cervical and thoracic paraspinals, upper trap, rhomboids, lats, delts, and sub occipital region.  Had patient roll to supine, then proceeded to gentle rib mobilization timed with breathing due to patients complaints about pain under her breast region.  Patient reported feeling like she could breath easier after.  Soft tissue mobilization to sternocleidomastoids and pec region to assist with decreased pain.   06/29/24 Findings from evaluation discussed, pt educated on plan of care, HEP initiated.    SCM stretch x20 sec bil Lev scap stretch x20 sec bil Pec stretch at door frame x20 sec staggered position  PATIENT EDUCATION:  Education details: HEP, importance of flexibility, strength and manual  Person educated: Patient Education method: Explanation, Demonstration, Tactile cues, Verbal cues, and Handouts Education comprehension: verbalized understanding, returned demonstration, verbal cues required, and tactile cues required  HOME EXERCISE PROGRAM: Access Code: [email or web address] URL: [email or web address] Date: 06/29/2024 Prepared by: Burnard  Exercises - Doorway Pec Stretch at 90 Degrees Abduction  - 3 x daily - 7 x weekly - 1 sets - 3 reps - 20 hold - Seated Scalenes Stretch  - 3 x daily - 7 x weekly - 1 sets - 3 reps - 20 hold - Gentle Levator Scapulae Stretch  - 3 x daily - 7 x weekly - 1 sets - 3 reps - 20 hold  ASSESSMENT:  CLINICAL IMPRESSION:  Ms Velador presents to skilled PT reporting that she had increased pain last night and that continues to limit her.  Patient states that she was interested in  myofascial work today given her increased pain last night.  Patient admits that she is having some increased stress, though happy stress, due to her son getting married in a couple of weeks.  She verbalizes understanding of body's possible response to stress.  She states that she was finally able to fall asleep after her husband massaged her and is having some decreased pain today.  Patient asked about dry needling, but then she revealed that she had a needle phobia, so with conversation about needling and patients comfort level, it was decided that we would hold on dry needling until patient can be more comfortable with treatment.  Patient did report less pain after soft tissue mobilization with manual trigger point release.  By end of session, patient reports feeling more relaxed and that she is able to breathe easier.  Patient to have her cervical MRI tomorrow, then she will review the scans with her MD in early September.  Patient has a return visit to PT after she has her MD follow up appointment and her son's wedding.  OBJECTIVE IMPAIRMENTS: Abnormal gait, decreased activity tolerance, decreased balance, decreased coordination, decreased endurance, decreased knowledge of condition, decreased mobility, decreased ROM, decreased strength, decreased safety awareness, increased fascial restrictions, increased muscle spasms, impaired flexibility, impaired sensation, impaired UE functional use, improper body mechanics, postural dysfunction, and pain.   ACTIVITY LIMITATIONS: carrying, lifting, bending, standing, squatting, sleeping, stairs, transfers, bed mobility, bathing, dressing, reach over head, and caring for others  PARTICIPATION LIMITATIONS: meal prep, cleaning, laundry, driving, and occupation  PERSONAL FACTORS: Behavior pattern, Past/current experiences, and 1-2 comorbidities: fibromyalgia are also affecting patient's functional outcome.   REHAB POTENTIAL: Good  CLINICAL DECISION MAKING:  Evolving/moderate complexity  EVALUATION COMPLEXITY: Moderate   GOALS: Goals reviewed with patient? Yes  SHORT TERM GOALS: Target date: 07/29/24  Pt will be independent with initial HEP so she is able to complete exercises on her own.  Baseline:  Goal status: Ongoing  2.  Pt will be able to read for longer than 30 min at a time without neck pain so she can continue participating in hobbies she enjoys.  Baseline:  Goal status: INITIAL  3.  Pt will have decreased NDI score < or = to 18/50 she increases her functional capacity for work.  Baseline: 28/50 Goal status: INITIAL    LONG TERM GOALS: Target date: 08/26/24  Pt will demonstrate competency with her advanced HEP so she can remain independent at home and after PT discharge.  Baseline:  Goal status: INITIAL  2.  Pt will have increased  shoulder ROM to be able to wash her hair, shower, and dress in a timely manner without difficulty.  Baseline: Decreased normal ROM Goal status: INITIAL  3.  Pt will have decrease in headache frequency each month, having only 1-2 instead of 10 so she has higher cognitive functional capacity for work.  Baseline: 10 headaches monthly Goal status: INITIAL  4.  Patient will increase cervical ROM into Rt rotation by 15 deg or more, without pain so she is able to drive as long as she needs to take care of her Aunt, and her children.  Baseline:  Goal status: INITIAL  5.  Pt will be able to sleep through the night without disturbance of neck/shoulder pain waking her up so she is well rested for daily activities.  Baseline: 2-3 hours of sleeplessness Goal status: INITIAL  6.  Pt will have 4+/5 MMT strength in bilateral shoulders so she is able to lift heavy objects without pain or difficulty.  Baseline:  Goal status: INITIAL   PLAN:  PT FREQUENCY: 1-2x/week  PT DURATION: 8 weeks  PLANNED INTERVENTIONS: 97164- PT Re-evaluation, 97750- Physical Performance Testing, 97110-Therapeutic  exercises, 97530- Therapeutic activity, [id number]- Neuromuscular re-education, 97535- Self Care, [id number]- Manual therapy, [id number]- Gait training, [phone]- Orthotic/Prosthetic subsequent, [phone]- Canalith repositioning, [id number]- Aquatic Therapy, [id number]- Splinting, [id number]- Electrical stimulation (unattended), [id number]- Ultrasound, [id number]- Traction (mechanical), [id number]- Ionotophoresis 4mg /ml Dexamethasone , 02981- Parrafin, Patient/Family education, Balance training, Taping, Joint mobilization, Spinal mobilization, Scar mobilization, Compression bandaging, Vestibular training, Visual/preceptual remediation/compensation, DME instructions, and Cryotherapy  PLAN FOR NEXT SESSION: Ask about MRI and MD follow-up.  AROM only for shoulder retraction, rotator cuff muscle strengthening, STM to upper traps, Suboccipitals, pec major and minor, Ulnar N glides.  Pt specifically asked for myofascial release so incorporate manual into treatment at first.  She will consider dry needling as well (but pt does have needle phobia).    Jarrell Laming, PT, DPT 07/05/24, 1:38 PM  [facility] [address], [address] [place], [place] [address] Phone # [phone] Fax [phone]

## 2024-07-06 ENCOUNTER — Ambulatory Visit
Admission: RE | Admit: 2024-07-06 | Discharge: 2024-07-06 | Disposition: A | Discharge status: Home / Self Care | Source: Ambulatory Visit | Attending: Neurosurgery | Admitting: Neurosurgery

## 2024-07-06 ENCOUNTER — Telehealth: Payer: Self-pay | Admitting: Cardiology

## 2024-07-06 DIAGNOSIS — M542 Cervicalgia: Secondary | ICD-10-CM

## 2024-07-06 NOTE — Telephone Encounter (Signed)
  Patient need to know if she ever had a surgical mesh in the past, she need to know fir her MRI procedure

## 2024-07-06 NOTE — Telephone Encounter (Signed)
 Spoke with patient. She is at testing center for cMRI. Did EPIC search for mesh with no results. Looked over surgical report from 2017 (Dr. Dusty) and did not see mention of mesh. She also asked about her cholecystectomy and clips - unable to see full report from Dr. Ethyl from 2021. Provided this info to patient. No additional assistance needed at this time.

## 2024-07-21 ENCOUNTER — Encounter: Payer: Self-pay | Admitting: Physical Therapy

## 2024-07-21 ENCOUNTER — Ambulatory Visit: Attending: Neurosurgery | Admitting: Physical Therapy

## 2024-07-21 DIAGNOSIS — R279 Unspecified lack of coordination: Secondary | ICD-10-CM | POA: Diagnosis present

## 2024-07-21 DIAGNOSIS — M6281 Muscle weakness (generalized): Secondary | ICD-10-CM | POA: Insufficient documentation

## 2024-07-21 DIAGNOSIS — M542 Cervicalgia: Secondary | ICD-10-CM | POA: Diagnosis present

## 2024-07-21 DIAGNOSIS — M62838 Other muscle spasm: Secondary | ICD-10-CM | POA: Diagnosis present

## 2024-07-21 DIAGNOSIS — R293 Abnormal posture: Secondary | ICD-10-CM | POA: Diagnosis present

## 2024-07-21 NOTE — Patient Instructions (Signed)
RE-ALIGNMENT ROUTINE EXERCISES-OSTEOPROROSIS BASIC FOR POSTURAL CORRECTION   RE-ALIGNMENT Tips BENEFITS: 1.It helps to re-align the curves of the back and improve standing posture. 2.It allows the back muscles to rest and strengthen in preparation for more activity. FREQUENCY: Daily, even after weeks, months and years of more advanced exercises. START: 1.All exercises start in the same position: lying on the back, arms resting on the supporting surface, palms up and slightly away from the body, backs of hands down, knees bent, feet flat. 2.The head, neck, arms, and legs are supported according to specific instructions of your therapist. Copyright  VHI. All rights reserved.    1. Decompression Exercise: Basic.   Takes compression off the vertebral bodies; increases tolerance for lying on the back; helps relieve back pain   Lie on back on firm surface, knees bent, feet flat, arms turned up, out to sides (~35 degrees). Head neck and arms supported as necessary. Time _5-15__ minutes. Surface: floor     2. Shoulder Press  Strengthens upper back extensors and scapular retractors.   Press both shoulders down. Hold _2-3__ seconds. Repeat _3-5__ times. Surface: floor        3. Head Press With Chin Tuck  Strengthens neck extensors   Tuck chin SLIGHTLY toward chest, keep mouth closed. Feel weight on back of head. Increase weight by pressing head down. Hold _2-3__ seconds. Relax. Repeat 3-5___ times. Surface: floor     4. Leg Lengthener: stretches quadratus lumborum and hip flexors.  Strengthens quads and ankle dorsiflexors.  Leg Lengthener: Full    Straighten one leg. Pull toes AND forefoot toward knee, extend heel. Lengthen leg by pulling pelvis away from ribs. Hold __5_ seconds. Relax. Repeat 1 time. Re-bend knee. Do other leg. Each leg __5_ times. Surface: floor    Leg Lengthener / Leg Press Combo: Single Leg    Straighten one leg down to floor. Pull toes AND forefoot  toward knee; extend heel. Lengthen leg by pulling pelvis away from ribs. Press leg down. DO NOT BEND KNEE. Hold _5__ seconds. Relax leg. Repeat exercise 1 time. Relax leg. Re-bend knee. Repeat with other leg. Do 5 times    

## 2024-07-21 NOTE — Therapy (Signed)
 OUTPATIENT PHYSICAL THERAPY TREATMENT NOTE   Patient Name: Emily Joseph MRN: [id number] DOB:02-Jan-1971, 53 y.o., female Today's Date: 07/21/2024  END OF SESSION:  PT End of Session - 07/21/24 1452     Visit Number 3    Date for PT Re-Evaluation 08/24/24    Authorization Type [employer] -no auth required    PT Start Time 1452    PT Stop Time 1529    PT Time Calculation (min) 37 min    Activity Tolerance Patient limited by pain          Past Medical History:  Diagnosis Date   Adenomyosis    not papanicolaou smear of cervix and cervical HPV   Allergy    Anxiety 06/08/2016   -about her health and about taking medications   Arthritis    ?   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 Ishmael in 2017, [place] rheum -seeing Dr. Ines, Neurologist for back pain with radicular symptoms   Complication of anesthesia    with epidural bp bottoms out   Dysrhythmia    fast hr   Eosinophilic esophagitis    Sees Dr. Avram   Fibromyalgia    GERD (gastroesophageal reflux disease)    H/O laminectomy 09/07/2020   Heart murmur    History of atrial flutter 10/2016   spontaneous conversion to sinus   History of gallstones    History of kidney stones    History of laparoscopic-assisted vaginal hysterectomy 09/07/2020   History of pneumonia    when pt was pregnant   History of pneumothorax 07/2016   Right   History of sinus tachycardia    Iron deficiency anemia due to chronic blood loss    Menorrhagia, s/p complete hysterectomy, ovaries remain hx   Irritable bowel syndrome 01/27/2013   Dr Avram 02/2016    Microhematuria    Mouth problem    failed gum graft 1/16   Nasal airway abnormality    Nasal obstruction 06/26/2015   Seen by ENT 06-26-15.  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   Shortness of breath dyspnea    SUI (stress urinary incontinence, female)    Syncope    with last child with epideral   UTI (urinary tract infection) 07/14/2016   >=100,000 COLONIES/mL KLEBSIELLA PNEUMONIA   Vasovagal episode    post eidural   Vitamin D  deficiency    Past Surgical History:  Procedure Laterality Date   ABDOMINAL HYSTERECTOMY     APPENDECTOMY  01/1989   BALLOON DILATION N/A 02/09/2013   Procedure: BALLOON DILATION;  Surgeon: Elsie Cree, MD;  Location: [facility];  Service: Endoscopy;  Laterality: N/A;   BILATERAL SALPINGECTOMY N/A 06/14/2014   Procedure: BILATERAL SALPINGECTOMY;  Surgeon: Lynwood FORBES Curlene PONCE, MD;  Location: [facility];  Service: Gynecology;  Laterality: N/A;   BIOPSY  09/04/2021   Procedure: BIOPSY;  Surgeon: Avram Lupita FORBES, MD;  Location: [facility];  Service: Endoscopy;;   BIOPSY  05/07/2023   Procedure: BIOPSY;  Surgeon: Avram Lupita FORBES, MD;  Location: [facility];  Service: Gastroenterology;;   BIOPSY  09/24/2023   Procedure: BIOPSY;  Surgeon: Avram Lupita BRAVO, MD;  Location: [facility];  Service: Gastroenterology;;   BREAST BIOPSY Right    biopsy    CHOLECYSTECTOMY N/A 11/21/2019   Procedure: LAPAROSCOPIC CHOLECYSTECTOMY WITH INTRAOPERATIVE CHOLANGIOGRAM;  Surgeon: Ethyl Lenis, MD;  Location: [facility];  Service: General;  Laterality: N/A;   COLONOSCOPY WITH PROPOFOL  N/A 02/09/2013   Procedure: COLONOSCOPY WITH PROPOFOL ;  Surgeon: Elsie Cree, MD;  Location: [facility];  Service: Endoscopy;  Laterality: N/A;  need ultra thin colon scope   COLONOSCOPY WITH PROPOFOL  N/A 05/07/2023   Procedure: COLONOSCOPY WITH PROPOFOL ;  Surgeon: Avram Lupita BRAVO, MD;  Location: [facility];  Service: Gastroenterology;  Laterality: N/A;   ESOPHAGEAL DILATION   09/04/2021   Procedure: ESOPHAGEAL DILATION;  Surgeon: Avram Lupita BRAVO, MD;  [facility]: [facility];  Service: Endoscopy;;   ESOPHAGEAL DILATION  05/07/2023   Procedure: ESOPHAGEAL DILATION;  Surgeon: Avram Lupita BRAVO, MD;  [facility]: [facility];  Service: Gastroenterology;;   ESOPHAGOGASTRODUODENOSCOPY Left 06/30/2021   Procedure: ESOPHAGOGASTRODUODENOSCOPY (EGD);  Surgeon: San Sandor GAILS, DO;  Location: [facility];  Service: Gastroenterology;  Laterality: Left;   ESOPHAGOGASTRODUODENOSCOPY (EGD) WITH PROPOFOL  N/A 02/09/2013   Procedure: ESOPHAGOGASTRODUODENOSCOPY (EGD) WITH PROPOFOL ;  Surgeon: Elsie Cree, MD;  [facility];  Service: Endoscopy;  Laterality: N/A;   ESOPHAGOGASTRODUODENOSCOPY (EGD) WITH PROPOFOL  N/A 09/04/2021   Procedure: ESOPHAGOGASTRODUODENOSCOPY (EGD) WITH PROPOFOL ;  Surgeon: Avram Lupita BRAVO, MD;  [facility]: [facility];  Service: Endoscopy;  Laterality: N/A;   ESOPHAGOGASTRODUODENOSCOPY (EGD) WITH PROPOFOL  N/A 05/07/2023   Procedure: ESOPHAGOGASTRODUODENOSCOPY (EGD) WITH PROPOFOL ;  Surgeon: Avram Lupita BRAVO, MD;  [facility]: [facility];  Service: Gastroenterology;  Laterality: N/A;   ESOPHAGOGASTRODUODENOSCOPY (EGD) WITH PROPOFOL  N/A 09/24/2023   Procedure: ESOPHAGOGASTRODUODENOSCOPY (EGD) WITH PROPOFOL ;  Surgeon: Avram Lupita BRAVO, MD;  [facility]: [facility];  Service: Gastroenterology;  Laterality: N/A;   FOREIGN BODY REMOVAL  06/30/2021   Procedure: FOREIGN BODY REMOVAL;  Surgeon: San Sandor GAILS, DO;  Location: [facility];  Service: Gastroenterology;;   LAPAROSCOPIC ASSISTED VAGINAL HYSTERECTOMY Right 06/14/2014   Procedure: OPEN LAPAROSCOPIC ASSISTED VAGINAL HYSTERECTOMY;  Surgeon: Lynwood BRAVO Curlene PONCE, MD;  Location: [facility];  Service: Gynecology;  Laterality: Right;   LYSIS OF ADHESION N/A 06/14/2014   Procedure: LYSIS OF ADHESION;  Surgeon: Lynwood BRAVO Curlene PONCE, MD;  Location: [facility];  Service: Gynecology;  Laterality: N/A;   MINIMALLY INVASIVE  EXCISION OF ATRIAL MYXOMA Right 07/11/2016   Procedure: MINIMALLY INVASIVE RESECTION OF RIGHT ATRIAL LIPOMA WITH CLOSURE OF PATENT FORAMEN OVALE;  Surgeon: Sudie VEAR Laine, MD;  Location: [facility];  Service: Open Heart Surgery;  Laterality: Right;   OVARIAN CYST REMOVAL Right 1990   TEE WITHOUT CARDIOVERSION N/A 07/11/2016   Procedure: TRANSESOPHAGEAL ECHOCARDIOGRAM (TEE);  Surgeon: Sudie VEAR Laine, MD;  Location: [facility];  Service: Open Heart Surgery;  Laterality: N/A;   Patient Active Problem List   Diagnosis Date Noted   Overweight (BMI 25.0-29.9) 03/14/2024   Plantar wart of left foot 12/25/2023   Esophageal dysphagia 05/07/2023   Esophageal stricture 05/07/2023   Colon cancer screening 05/07/2023   Pain in right hip 03/12/2022   Esophageal web    Female stress incontinence 09/07/2020   Heart disease 09/07/2020   Primary osteoarthritis of left knee 05/31/2020   At high risk for breast cancer 08/30/2019   Pelvic floor dysfunction 12/28/2018   Chronic RUQ pain - exacerbation 12/28/2018   Atrial flutter with rapid  ventricular response (HCC) 10/27/2016   s/p minimally invasive resection of right atrial lipoma    Chronic pain 06/08/2016   Chronic fatigue 06/08/2016   GERD (gastroesophageal reflux disease) with esophagitis 06/08/2016   Palpitations 06/08/2016   Anxiety 06/08/2016   Multiple environmental allergies 03/04/2016   Multiple food allergies 03/04/2016   Irritable bowel syndrome with constipation 01/27/2013   Fibromyalgia 01/07/2007    PCP: Charlies Bellini DO  REFERRING PROVIDER: Arley Helling, MD  REFERRING DIAG: Cervicalgia  THERAPY DIAG:  Muscle weakness (generalized)  Unspecified lack of coordination  Abnormal posture  Other muscle spasm  Neck pain  Rationale for Evaluation and Treatment: Rehabilitation  ONSET DATE: June 2025  SUBJECTIVE:                                                                                                                                                                                                          SUBJECTIVE STATEMENT: Had MRI see below.  Patient states Dr. Helling said this is pretty bad.  Will probably need surgery.  He will see me every 3 months. Had 90 min massage monthly. Walked 2 miles yesterday but I was so tired when I got home.  Mid thoracic pain, posterior neck, shoulders, head. I have a headache. There is a sensitivity in my left arm.  I really feel it in my pecs. My son's wedding went well   Hand dominance: Ambidextrous  PERTINENT HISTORY:  Hysterectomy, gave birth to 4 children, Anemia, Fibromyalgia, Ehlers-Danlos Syn, High risk for breast cancer, pelvic floor dysfunction, Cholecystectomy  Injured T 12 after hysterectomy surgery PAIN:  Are you having pain? Yes: NPRS scale: currently 5-6/10 Pain location: outside of right neck, left shoulder radiating down arm  Pain description: catching,locking, sharp, stiff, tight, sore Aggravating factors: Turning head while driving, laying flat,has to put a pillow under forearms in supine position  Relieving factors: Massaging, compression, heat,   PRECAUTIONS: None  RED FLAGS: None     WEIGHT BEARING RESTRICTIONS: No  FALLS:  Has patient fallen in last 6 months? 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  DIAGNOSTIC FINDINGS:  MRI CERVICAL SPINE WITHOUT CONTRAST   TECHNIQUE: Multiplanar, multisequence MR imaging of the cervical spine was performed. No intravenous contrast was  administered.   COMPARISON:  None Available.   FINDINGS: Alignment: Mild levoscoliosis with straightening and slight reversal of the normal cervical lordosis, apex at C5-6. No significant listhesis.   Vertebrae: Vertebral body height maintained without acute or chronic fracture. Bone marrow signal intensity within normal limits. No discrete or worrisome osseous lesions or abnormal marrow edema.   Cord: Normal signal and morphology.   Posterior Fossa, vertebral arteries, paraspinal tissues: Unremarkable.   Disc levels:   C2-C3: Negative interspace.  No canal or foraminal stenosis.   C3-C4: Minimal uncovertebral spurring without significant disc bulge. No stenosis.   C4-C5: Small central disc protrusion mildly indents the ventral thecal sac. No spinal stenosis. Foramina remain patent.   C5-C6: Mild degenerate intervertebral disc space narrowing with circumferential disc osteophyte complex. Posterior component flattens and partially faces the ventral thecal sac, but without significant spinal stenosis. Moderate right worse than left C6 foraminal narrowing.   C6-C7: Mild disc bulge with uncovertebral spurring. Mild right-sided facet hypertrophy. No spinal stenosis. Foramina remain patent.   C7-T1: Normal interspace. Mild bilateral facet hypertrophy. No spinal stenosis. Foramina remain patent.   IMPRESSION: 1. Degenerative spondylosis at C5-6 with resultant moderate right worse than left C6 foraminal stenosis. 2. Additional mild for age noncompressive disc bulging at C4-5 and C6-7 without significant stenosis or impingement.          Scans on Order [id number]    PATIENT SURVEYS:  NDI:  NECK DISABILITY INDEX  Date: 06/29/24 Score  Pain intensity 1 = The pain is very mild at the moment  2. Personal care (washing, dressing, etc.) 2 = It is painful to look after myself and I am slow and careful  3. Lifting 5 = I cannot lift or carry anything   4. Reading 3 = I can't  read as much as I want because of moderate pain in my neck  5. Headaches 3 = I have moderate headaches, which come frequently  6. Concentration 3 = I have a lot of difficulty in concentrating when I want to  7. Work 2 = I can do most of my usual work, but no more  8. Driving 3 = I can't drive my car as long as I want because of moderate pain in my neck  9. Sleeping 3 =  My sleep is moderately disturbed (2-3 hrs sleepless)  10. Recreation 3 = I am able to engage in a few of my usual recreation activities because of pain in   my neck  Total 28/50 = 56%   Minimum Detectable Change (90% confidence): 5 points or 10% points  COGNITION: Overall cognitive status: Within functional limits for tasks assessed  SENSATION: Light touch: WFL  POSTURE: rounded shoulders, forward head, and increased thoracic kyphosis  PALPATION: Tenderness at Right upper trap, tenderness in thoracic spine, muscle tightness in suboccipital muscles   CERVICAL ROM:   Active ROM *=pain AROM (deg) eval  Flexion 40  Extension 38*  Right lateral flexion 35*  Left lateral flexion 30  Right rotation 49*  Left rotation 60   (Blank rows = not tested)  UPPER EXTREMITY ROM:  Active ROM Right eval Left eval  Shoulder flexion Dec ROM Dec ROM  Shoulder extension [facility]  Shoulder abduction Dec ROM Dec ROM  Shoulder adduction    Shoulder extension  Shoulder internal rotation Dec ROM Dec ROM  Shoulder external rotation Dec ROM Dec ROM    (Blank rows = not tested)  UPPER EXTREMITY MMT:  MMT Right eval Left eval  Shoulder flexion 4- 4-  Shoulder extension 3+ 3+  Shoulder abduction 4- 4-  Shoulder adduction 4 4  Shoulder extension 3+ 3+  Shoulder internal rotation 4- 4-  Shoulder external rotation 4+ 4+  Middle trapezius 3+ 3+  Lower trapezius 3+ 3+   (Blank rows = not tested)  CERVICAL SPECIAL TESTS:  Upper limb tension test (ULTT): Positive  FUNCTIONAL TESTS:    TREATMENT DATE:   07/21/2024 Discussed MRI results and Dr. Eliott recommendations Discussed the benefit of Cervical isometrics submax with 2 fingers resist (pt is fearful of doing in sitting, try supine next visit) Supine decompression series: (Added to HEP- see pt instructions) 1) deep breathing with palms up for pectoral stretch (added overpressure with exhalation) 2) shoulder press down 5x 3) whole leg lengthener 5x right/left 4) whole leg press down 5x right/left Manual therapy:  suboccipital release 3 min; upper trap contract relax 3x right/left; gentle cervical distraction on/off no hold for comfort, soft tissue mobilization to upper traps  07/05/2024 Seated green pball rollout within comfortable range x10 Review of HEP Manual therapy:  started in prone and worked on soft tissue mobilization and gentle manual trigger point release to cervical and thoracic paraspinals, upper trap, rhomboids, lats, delts, and sub occipital region.  Had patient roll to supine, then proceeded to gentle rib mobilization timed with breathing due to patients complaints about pain under her breast region.  Patient reported feeling like she could breath easier after.  Soft tissue mobilization to sternocleidomastoids and pec region to assist with decreased pain.   06/29/24 Findings from evaluation discussed, pt educated on plan of care, HEP initiated.    SCM stretch x20 sec bil Lev scap stretch x20 sec bil Pec stretch at door frame x20 sec staggered position                                                                                                               PATIENT EDUCATION:  Education details: HEP, importance of flexibility, strength and manual  Person educated: Patient Education method: Explanation, Demonstration, Tactile cues, Verbal cues, and Handouts Education comprehension: verbalized understanding, returned demonstration, verbal cues required, and tactile cues required  HOME EXERCISE PROGRAM: Access Code:  [email or web address] URL: [email or web address] Date: 06/29/2024 Prepared by: Burnard  Exercises - Doorway Pec Stretch at 90 Degrees Abduction  - 3 x daily - 7 x weekly - 1 sets - 3 reps - 20 hold - Seated Scalenes Stretch  - 3 x daily - 7 x weekly - 1 sets - 3 reps - 20 hold - Gentle Levator Scapulae Stretch  - 3 x daily - 7 x weekly - 1 sets - 3 reps - 20 hold  ASSESSMENT:  CLINICAL IMPRESSION: Anticipate a slower progression of PT secondary to high pain sensitivity and numerous co-morbidities.  She responded well to supine decompression  ex's with low repetitions and submaximal intensity encouraged.  Therapist closely monitoring response throughout session and adjusting manual therapy techniques and ex's accordingly.   OBJECTIVE IMPAIRMENTS: Abnormal gait, decreased activity tolerance, decreased balance, decreased coordination, decreased endurance, decreased knowledge of condition, decreased mobility, decreased ROM, decreased strength, decreased safety awareness, increased fascial restrictions, increased muscle spasms, impaired flexibility, impaired sensation, impaired UE functional use, improper body mechanics, postural dysfunction, and pain.   ACTIVITY LIMITATIONS: carrying, lifting, bending, standing, squatting, sleeping, stairs, transfers, bed mobility, bathing, dressing, reach over head, and caring for others  PARTICIPATION LIMITATIONS: meal prep, cleaning, laundry, driving, and occupation  PERSONAL FACTORS: Behavior pattern, Past/current experiences, and 1-2 comorbidities: fibromyalgia are also affecting patient's functional outcome.   REHAB POTENTIAL: Good  CLINICAL DECISION MAKING: Evolving/moderate complexity  EVALUATION COMPLEXITY: Moderate   GOALS: Goals reviewed with patient? Yes  SHORT TERM GOALS: Target date: 07/29/24  Pt will be independent with initial HEP so she is able to complete exercises on her own.  Baseline:  Goal status: Ongoing  2.  Pt will be able to  read for longer than 30 min at a time without neck pain so she can continue participating in hobbies she enjoys.  Baseline:  Goal status: INITIAL  3.  Pt will have decreased NDI score < or = to 18/50 she increases her functional capacity for work.  Baseline: 28/50 Goal status: INITIAL    LONG TERM GOALS: Target date: 08/26/24  Pt will demonstrate competency with her advanced HEP so she can remain independent at home and after PT discharge.  Baseline:  Goal status: INITIAL  2.  Pt will have increased shoulder ROM to be able to wash her hair, shower, and dress in a timely manner without difficulty.  Baseline: Decreased normal ROM Goal status: INITIAL  3.  Pt will have decrease in headache frequency each month, having only 1-2 instead of 10 so she has higher cognitive functional capacity for work.  Baseline: 10 headaches monthly Goal status: INITIAL  4.  Patient will increase cervical ROM into Rt rotation by 15 deg or more, without pain so she is able to drive as long as she needs to take care of her Aunt, and her children.  Baseline:  Goal status: INITIAL  5.  Pt will be able to sleep through the night without disturbance of neck/shoulder pain waking her up so she is well rested for daily activities.  Baseline: 2-3 hours of sleeplessness Goal status: INITIAL  6.  Pt will have 4+/5 MMT strength in bilateral shoulders so she is able to lift heavy objects without pain or difficulty.  Baseline:  Goal status: INITIAL   PLAN:  PT FREQUENCY: 1-2x/week  PT DURATION: 8 weeks  PLANNED INTERVENTIONS: 97164- PT Re-evaluation, 97750- Physical Performance Testing, 97110-Therapeutic exercises, 97530- Therapeutic activity, [id number]- Neuromuscular re-education, 97535- Self Care, 02859- Manual therapy, [id number]- Gait training, [phone]- Orthotic/Prosthetic subsequent, [phone]- Canalith repositioning, [id number]- Aquatic Therapy, [phone]- Splinting, H9716- Electrical stimulation (unattended), [id number]- Ultrasound,  [id number]- Traction (mechanical), [id number]- Ionotophoresis 4mg /ml Dexamethasone , 02981- Parrafin, Patient/Family education, Balance training, Taping, Joint mobilization, Spinal mobilization, Scar mobilization, Compression bandaging, Vestibular training, Visual/preceptual remediation/compensation, DME instructions, and Cryotherapy  PLAN FOR NEXT SESSION: try cervical MELT method (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  Pt specifically asked for myofascial release so incorporate  manual into treatment at first.  She will consider dry needling as well (but pt does have needle phobia).    Glade Pesa, PT 07/21/24 5:27 PM Phone: [phone] Fax: [phone]  [facility] [address], [address] [place], [place] [address] Phone # [phone] Fax [phone]

## 2024-07-26 ENCOUNTER — Ambulatory Visit: Admitting: Physical Therapy

## 2024-07-26 DIAGNOSIS — M62838 Other muscle spasm: Secondary | ICD-10-CM

## 2024-07-26 DIAGNOSIS — R279 Unspecified lack of coordination: Secondary | ICD-10-CM

## 2024-07-26 DIAGNOSIS — M542 Cervicalgia: Secondary | ICD-10-CM

## 2024-07-26 DIAGNOSIS — M6281 Muscle weakness (generalized): Secondary | ICD-10-CM

## 2024-07-26 DIAGNOSIS — R293 Abnormal posture: Secondary | ICD-10-CM
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
## 2024-07-26 NOTE — Therapy (Signed)
 OUTPATIENT PHYSICAL THERAPY TREATMENT NOTE   Patient Name: Emily Joseph MRN: [id number] DOB:[birth year], 53 y.o., female Today's Date: 07/26/2024  END OF SESSION:  PT End of Session - 07/26/24 1234     Visit Number 4    Date for PT Re-Evaluation 08/24/24    Authorization Type [employer] -no auth required    PT Start Time 1232    PT Stop Time 1315    PT Time Calculation (min) 43 min    Activity Tolerance Patient limited by pain          Past Medical History:  Diagnosis Date   Adenomyosis    not papanicolaou smear of cervix and cervical HPV   Allergy    Anxiety 06/08/2016   -about her health and about taking medications   Arthritis    ?   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 Ishmael in 2017, [place] [facility] -seeing Dr. Ines, Neurologist for back pain with radicular symptoms   Complication of anesthesia    with epidural bp bottoms out   Dysrhythmia    fast hr   Eosinophilic esophagitis    Sees Dr. Avram   Fibromyalgia    GERD (gastroesophageal reflux disease)    H/O laminectomy 09/07/2020   Heart murmur    History of atrial flutter 10/2016   spontaneous conversion to sinus   History of gallstones    History of kidney stones    History of laparoscopic-assisted vaginal hysterectomy 09/07/2020   History of pneumonia    when pt was pregnant   History of pneumothorax 07/2016   Right   History of sinus tachycardia    Iron deficiency anemia due to chronic blood loss    Menorrhagia, s/p complete hysterectomy, ovaries remain hx   Irritable bowel syndrome 01/27/2013   Dr Avram 02/2016    Microhematuria    Mouth problem    failed gum graft 1/16   Nasal airway abnormality    Nasal obstruction 06/26/2015   Seen by ENT 06-26-15.  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   Shortness of breath dyspnea    SUI (stress urinary incontinence, female)    Syncope    with last child with epideral   UTI (urinary tract infection) 07/14/2016   >=100,000 COLONIES/mL KLEBSIELLA PNEUMONIA   Vasovagal episode    post eidural   Vitamin D  deficiency    Past Surgical History:  Procedure Laterality Date   ABDOMINAL HYSTERECTOMY     APPENDECTOMY  01/1989   BALLOON DILATION N/A 02/09/2013   Procedure: BALLOON DILATION;  Surgeon: Elsie Cree, MD;  Location: [facility];  Service: Endoscopy;  Laterality: N/A;   BILATERAL SALPINGECTOMY N/A 06/14/2014   Procedure: BILATERAL SALPINGECTOMY;  Surgeon: Lynwood FORBES Curlene PONCE, MD;  Location: [facility];  Service: Gynecology;  Laterality: N/A;   BIOPSY  09/04/2021   Procedure: BIOPSY;  Surgeon: Avram Lupita FORBES, MD;  Location: [facility];  Service: Endoscopy;;   BIOPSY  05/07/2023   Procedure: BIOPSY;  Surgeon: Avram Lupita FORBES, MD;  Location: [facility];  Service: Gastroenterology;;   BIOPSY  09/24/2023   Procedure: BIOPSY;  Surgeon: Avram Lupita BRAVO, MD;  Location: [facility];  Service: Gastroenterology;;   BREAST BIOPSY Right    biopsy    CHOLECYSTECTOMY N/A 11/21/2019   Procedure: LAPAROSCOPIC CHOLECYSTECTOMY WITH INTRAOPERATIVE CHOLANGIOGRAM;  Surgeon: Ethyl Lenis, MD;  Location: [facility];  Service: General;  Laterality: N/A;   COLONOSCOPY WITH PROPOFOL  N/A 02/09/2013   Procedure: COLONOSCOPY WITH PROPOFOL ;  Surgeon: Elsie Cree, MD;  Location: [facility];  Service: Endoscopy;  Laterality: N/A;  need ultra thin colon scope   COLONOSCOPY WITH PROPOFOL  N/A 05/07/2023   Procedure: COLONOSCOPY WITH PROPOFOL ;  Surgeon: Avram Lupita BRAVO, MD;  Location: [facility];  Service: Gastroenterology;  Laterality: N/A;   ESOPHAGEAL DILATION   09/04/2021   Procedure: ESOPHAGEAL DILATION;  Surgeon: Avram Lupita BRAVO, MD;  [facility];  Service: Endoscopy;;   ESOPHAGEAL DILATION  05/07/2023   Procedure: ESOPHAGEAL DILATION;  Surgeon: Avram Lupita BRAVO, MD;  [facility]: [facility];  Service: Gastroenterology;;   ESOPHAGOGASTRODUODENOSCOPY Left 06/30/2021   Procedure: ESOPHAGOGASTRODUODENOSCOPY (EGD);  Surgeon: San Sandor GAILS, DO;  Location: [facility];  Service: Gastroenterology;  Laterality: Left;   ESOPHAGOGASTRODUODENOSCOPY (EGD) WITH PROPOFOL  N/A 02/09/2013   Procedure: ESOPHAGOGASTRODUODENOSCOPY (EGD) WITH PROPOFOL ;  Surgeon: Elsie Cree, MD;  [facility];  Service: Endoscopy;  Laterality: N/A;   ESOPHAGOGASTRODUODENOSCOPY (EGD) WITH PROPOFOL  N/A 09/04/2021   Procedure: ESOPHAGOGASTRODUODENOSCOPY (EGD) WITH PROPOFOL ;  Surgeon: Avram Lupita BRAVO, MD;  [facility];  Service: Endoscopy;  Laterality: N/A;   ESOPHAGOGASTRODUODENOSCOPY (EGD) WITH PROPOFOL  N/A 05/07/2023   Procedure: ESOPHAGOGASTRODUODENOSCOPY (EGD) WITH PROPOFOL ;  Surgeon: Avram Lupita BRAVO, MD;  [facility];  Service: Gastroenterology;  Laterality: N/A;   ESOPHAGOGASTRODUODENOSCOPY (EGD) WITH PROPOFOL  N/A 09/24/2023   Procedure: ESOPHAGOGASTRODUODENOSCOPY (EGD) WITH PROPOFOL ;  Surgeon: Avram Lupita BRAVO, MD;  [facility]: [facility];  Service: Gastroenterology;  Laterality: N/A;   FOREIGN BODY REMOVAL  06/30/2021   Procedure: FOREIGN BODY REMOVAL;  Surgeon: San Sandor GAILS, DO;  Location: [facility];  Service: Gastroenterology;;   LAPAROSCOPIC ASSISTED VAGINAL HYSTERECTOMY Right 06/14/2014   Procedure: OPEN LAPAROSCOPIC ASSISTED VAGINAL HYSTERECTOMY;  Surgeon: Lynwood BRAVO Curlene PONCE, MD;  Location: [facility];  Service: Gynecology;  Laterality: Right;   LYSIS OF ADHESION N/A 06/14/2014   Procedure: LYSIS OF ADHESION;  Surgeon: Lynwood BRAVO Curlene PONCE, MD;  Location: [facility];  Service: Gynecology;  Laterality: N/A;   MINIMALLY INVASIVE  EXCISION OF ATRIAL MYXOMA Right 07/11/2016   Procedure: MINIMALLY INVASIVE RESECTION OF RIGHT ATRIAL LIPOMA WITH CLOSURE OF PATENT FORAMEN OVALE;  Surgeon: Sudie VEAR Laine, MD;  Location: [facility];  Service: Open Heart Surgery;  Laterality: Right;   OVARIAN CYST REMOVAL Right 1990   TEE WITHOUT CARDIOVERSION N/A 07/11/2016   Procedure: TRANSESOPHAGEAL ECHOCARDIOGRAM (TEE);  Surgeon: Sudie VEAR Laine, MD;  Location: [facility];  Service: Open Heart Surgery;  Laterality: N/A;   Patient Active Problem List   Diagnosis Date Noted   Overweight (BMI 25.0-29.9) 03/14/2024   Plantar wart of left foot 12/25/2023   Esophageal dysphagia 05/07/2023   Esophageal stricture 05/07/2023   Colon cancer screening 05/07/2023   Pain in right hip 03/12/2022   Esophageal web    Female stress incontinence 09/07/2020   Heart disease 09/07/2020   Primary osteoarthritis of left knee 05/31/2020   At high risk for breast cancer 08/30/2019   Pelvic floor dysfunction 12/28/2018   Chronic RUQ pain - exacerbation 12/28/2018   Atrial flutter with rapid  ventricular response (HCC) 10/27/2016   s/p minimally invasive resection of right atrial lipoma    Chronic pain 06/08/2016   Chronic fatigue 06/08/2016   GERD (gastroesophageal reflux disease) with esophagitis 06/08/2016   Palpitations 06/08/2016   Anxiety 06/08/2016   Multiple environmental allergies 03/04/2016   Multiple food allergies 03/04/2016   Irritable bowel syndrome with constipation 01/27/2013   Fibromyalgia 01/07/2007    PCP: Charlies Bellini DO  REFERRING PROVIDER: Arley Helling, MD  REFERRING DIAG: Cervicalgia  THERAPY DIAG:  Muscle weakness (generalized)  Unspecified lack of coordination  Abnormal posture  Other muscle spasm  Neck pain  Rationale for Evaluation and Treatment: Rehabilitation  ONSET DATE: June 2025  SUBJECTIVE:                                                                                                                                                                                                          SUBJECTIVE STATEMENT: I was really sore in my arms after last time but I think it really helped.  I still feel like I'm tight in my left arm.  I feel it with shampoo'ing my hair with arms overhead  Hand dominance: Ambidextrous  PERTINENT HISTORY:  Hysterectomy, gave birth to 4 children, Anemia, Fibromyalgia, Ehlers-Danlos Syn, High risk for breast cancer, pelvic floor dysfunction, Cholecystectomy  Injured T 12 after hysterectomy surgery PAIN:  Are you having pain? Yes: NPRS scale: currently 2 in neck, arms 3, sometimes 5-6/10 Pain location: outside of right neck, left shoulder radiating down arm  Pain description: catching,locking, sharp, stiff, tight, sore Aggravating factors: Turning head while driving, laying flat,has to put a pillow under forearms in supine position  Relieving factors: Massaging, compression, heat,   PRECAUTIONS: None  RED FLAGS: None     WEIGHT BEARING RESTRICTIONS: No  FALLS:  Has patient fallen in last 6 months? 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  DIAGNOSTIC FINDINGS:  MRI CERVICAL SPINE WITHOUT CONTRAST   TECHNIQUE: Multiplanar, multisequence MR imaging of the cervical spine was performed. No intravenous contrast was administered.   COMPARISON:  None Available.   FINDINGS: Alignment: Mild levoscoliosis with straightening and slight reversal of the normal cervical lordosis, apex at C5-6. No  significant listhesis.   Vertebrae: Vertebral body height maintained without acute or chronic fracture. Bone marrow signal intensity within normal limits. No discrete or worrisome osseous lesions or abnormal marrow edema.   Cord: Normal signal and morphology.   Posterior Fossa, vertebral arteries, paraspinal tissues: Unremarkable.   Disc levels:   C2-C3: Negative interspace.  No canal or foraminal stenosis.   C3-C4: Minimal uncovertebral spurring without significant disc bulge. No stenosis.   C4-C5: Small central disc protrusion mildly indents the ventral thecal sac. No spinal stenosis. Foramina remain patent.   C5-C6: Mild degenerate intervertebral disc space narrowing with circumferential disc osteophyte complex. Posterior component flattens and partially faces the ventral thecal sac, but without significant spinal stenosis. Moderate right worse than left C6 foraminal narrowing.   C6-C7: Mild disc bulge with uncovertebral spurring. Mild right-sided facet hypertrophy. No spinal stenosis. Foramina remain patent.   C7-T1: Normal interspace. Mild bilateral facet hypertrophy. No spinal stenosis. Foramina remain patent.   IMPRESSION: 1. Degenerative spondylosis at C5-6 with resultant moderate right worse than left C6 foraminal stenosis. 2. Additional mild for age noncompressive disc bulging at C4-5 and C6-7 without significant stenosis or impingement.          Scans on Order [id number]    PATIENT SURVEYS:  NDI:  NECK DISABILITY INDEX  Date: 06/29/24 Score  Pain intensity 1 = The pain is very mild at the moment  2. Personal care (washing, dressing, etc.) 2 = It is painful to look after myself and I am slow and careful  3. Lifting 5 = I cannot lift or carry anything   4. Reading 3 = I can't read as much as I want because of moderate pain in my neck  5. Headaches 3 = I have moderate headaches, which come frequently  6. Concentration 3 = I have a lot of difficulty in  concentrating when I want to  7. Work 2 = I can do most of my usual work, but no more  8. Driving 3 = I can't drive my car as long as I want because of moderate pain in my neck  9. Sleeping 3 =  My sleep is moderately disturbed (2-3 hrs sleepless)  10. Recreation 3 = I am able to engage in a few of my usual recreation activities because of pain in   my neck  Total 28/50 = 56%   Minimum Detectable Change (90% confidence): 5 points or 10% points  COGNITION: Overall cognitive status: Within functional limits for tasks assessed  SENSATION: Light touch: WFL  POSTURE: rounded shoulders, forward head, and increased thoracic kyphosis  PALPATION: Tenderness at Right upper trap, tenderness in thoracic spine, muscle tightness in suboccipital muscles   CERVICAL ROM:   Active ROM *=pain AROM (deg) eval  Flexion 40  Extension 38*  Right lateral flexion 35*  Left lateral flexion 30  Right rotation 49*  Left rotation 60   (Blank rows = not tested)  UPPER EXTREMITY ROM:  Active ROM Right eval Left eval  Shoulder flexion Dec ROM Dec ROM  Shoulder extension [facility]  Shoulder abduction Dec ROM Dec ROM  Shoulder adduction    Shoulder extension    Shoulder internal rotation Dec ROM Dec ROM  Shoulder external rotation Dec ROM Dec ROM    (Blank rows = not tested)  UPPER EXTREMITY MMT:  MMT Right eval Left  eval  Shoulder flexion 4- 4-  Shoulder extension 3+ 3+  Shoulder abduction 4- 4-  Shoulder adduction 4 4  Shoulder extension 3+ 3+  Shoulder internal rotation 4- 4-  Shoulder external rotation 4+ 4+  Middle trapezius 3+ 3+  Lower trapezius 3+ 3+   (Blank rows = not tested)  CERVICAL SPECIAL TESTS:  Upper limb tension test (ULTT): Positive  FUNCTIONAL TESTS:    TREATMENT DATE:  07/26/24: Discussion of response to treatment/status update Cervical MELT with foam roll: rotation, circles, fig 8, nods 5x each  Supine left UE neural mobilization: just wrist extension, just  elbow extension, just scapular depression and shoulder extension, then combination of all 10x each Sidelying open books 5x right/left needed for thoracic rotation for driving (Added to HEP- see below) Seated thoracic extension with ball hands supporting neck 10x (Added to HEP- see below)   07/21/2024 Discussed MRI results and Dr. Eliott recommendations Discussed the benefit of Cervical isometrics submax with 2 fingers resist (pt is fearful of doing in sitting, try supine next visit) Supine decompression series: (Added to HEP- see pt instructions) 1) deep breathing with palms up for pectoral stretch (added overpressure with exhalation) 2) shoulder press down 5x 3) whole leg lengthener 5x right/left 4) whole leg press down 5x right/left Manual therapy:  suboccipital release 3 min; upper trap contract relax 3x right/left; gentle cervical distraction on/off no hold for comfort, soft tissue mobilization to upper traps  07/05/2024 Seated green pball rollout within comfortable range x10 Review of HEP Manual therapy:  started in prone and worked on soft tissue mobilization and gentle manual trigger point release to cervical and thoracic paraspinals, upper trap, rhomboids, lats, delts, and sub occipital region.  Had patient roll to supine, then proceeded to gentle rib mobilization timed with breathing due to patients complaints about pain under her breast region.  Patient reported feeling like she could breath easier after.  Soft tissue mobilization to sternocleidomastoids and pec region to assist with decreased pain.   06/29/24 Findings from evaluation discussed, pt educated on plan of care, HEP initiated.    SCM stretch x20 sec bil Lev scap stretch x20 sec bil Pec stretch at door frame x20 sec staggered position                                                                                                               PATIENT EDUCATION:  Education details: HEP, importance of flexibility,  strength and manual  Person educated: Patient Education method: Explanation, Demonstration, Tactile cues, Verbal cues, and Handouts Education comprehension: verbalized understanding, returned demonstration, verbal cues required, and tactile cues required  HOME EXERCISE PROGRAM: Access Code: [email or web address] URL: [email or web address] Date: 07/26/2024 Prepared by: Glade Pesa  Exercises - Doorway Pec Stretch at 90 Degrees Abduction  - 3 x daily - 7 x weekly - 1 sets - 3 reps - 20 hold - Seated Scalenes Stretch  - 3 x daily - 7 x weekly - 1 sets - 3 reps - 20 hold - Gentle Levator Scapulae Stretch  -  3 x daily - 7 x weekly - 1 sets - 3 reps - 20 hold - Sidelying Open Book Thoracic Rotation with Knee on Foam Roll  - 1 x daily - 7 x weekly - 1 sets - 5 reps - Seated Thoracic Lumbar Extension with Pectoralis Stretch  - 1 x daily - 7 x weekly - 1 sets - 5 reps  ASSESSMENT:  CLINICAL IMPRESSION: Therapist very closely monitoring symptom response throughout treatment session.  Low numbers of repetitions encouraged to avoid exacerbation of pain.  Good initial response to neural mobilization in the supine position.  Therapist providing verbal cues to optimize technique with exercises in order to achieve the greatest benefit.   OBJECTIVE IMPAIRMENTS: Abnormal gait, decreased activity tolerance, decreased balance, decreased coordination, decreased endurance, decreased knowledge of condition, decreased mobility, decreased ROM, decreased strength, decreased safety awareness, increased fascial restrictions, increased muscle spasms, impaired flexibility, impaired sensation, impaired UE functional use, improper body mechanics, postural dysfunction, and pain.   ACTIVITY LIMITATIONS: carrying, lifting, bending, standing, squatting, sleeping, stairs, transfers, bed mobility, bathing, dressing, reach over head, and caring for others  PARTICIPATION LIMITATIONS: meal prep, cleaning, laundry, driving,  and occupation  PERSONAL FACTORS: Behavior pattern, Past/current experiences, and 1-2 comorbidities: fibromyalgia are also affecting patient's functional outcome.   REHAB POTENTIAL: Good  CLINICAL DECISION MAKING: Evolving/moderate complexity  EVALUATION COMPLEXITY: Moderate   GOALS: Goals reviewed with patient? Yes  SHORT TERM GOALS: Target date: 07/29/24  Pt will be independent with initial HEP so she is able to complete exercises on her own.  Baseline:  Goal status: Ongoing  2.  Pt will be able to read for longer than 30 min at a time without neck pain so she can continue participating in hobbies she enjoys.  Baseline:  Goal status: INITIAL  3.  Pt will have decreased NDI score < or = to 18/50 she increases her functional capacity for work.  Baseline: 28/50 Goal status: INITIAL    LONG TERM GOALS: Target date: 08/26/24  Pt will demonstrate competency with her advanced HEP so she can remain independent at home and after PT discharge.  Baseline:  Goal status: INITIAL  2.  Pt will have increased shoulder ROM to be able to wash her hair, shower, and dress in a timely manner without difficulty.  Baseline: Decreased normal ROM Goal status: INITIAL  3.  Pt will have decrease in headache frequency each month, having only 1-2 instead of 10 so she has higher cognitive functional capacity for work.  Baseline: 10 headaches monthly Goal status: INITIAL  4.  Patient will increase cervical ROM into Rt rotation by 15 deg or more, without pain so she is able to drive as long as she needs to take care of her Aunt, and her children.  Baseline:  Goal status: INITIAL  5.  Pt will be able to sleep through the night without disturbance of neck/shoulder pain waking her up so she is well rested for daily activities.  Baseline: 2-3 hours of sleeplessness Goal status: INITIAL  6.  Pt will have 4+/5 MMT strength in bilateral shoulders so she is able to lift heavy objects without pain or  difficulty.  Baseline:  Goal status: INITIAL   PLAN:  PT FREQUENCY: 1-2x/week  PT DURATION: 8 weeks  PLANNED INTERVENTIONS: 97164- PT Re-evaluation, 97750- Physical Performance Testing, 97110-Therapeutic exercises, 97530- Therapeutic activity, [id number]- Neuromuscular re-education, 97535- Self Care, 02859- Manual therapy, [id number]- Gait training, [id number]- Orthotic/Prosthetic subsequent, [id number]- Canalith repositioning, [id number]- Aquatic Therapy, [id number]- Splinting, H9716-  Electrical stimulation (unattended), [id number]- Ultrasound, 02987- Traction (mechanical), [id number]- Ionotophoresis 4mg /ml Dexamethasone , 02981- Parrafin, Patient/Family education, Balance training, Taping, Joint mobilization, Spinal mobilization, Scar mobilization, Compression bandaging, Vestibular training, Visual/preceptual remediation/compensation, DME instructions, and Cryotherapy  PLAN FOR NEXT SESSION: check STGS next visit;  try neural floss right and left (teach for home); cervical MELT method (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  Pt specifically asked for myofascial release so incorporate manual into treatment at first.  She will consider dry needling as well (but pt does have needle phobia).    Glade Pesa, PT 07/26/24 9:12 PM Phone: [phone] Fax: [phone]  [facility] [address] [place], [place] [address] Phone # [phone] Fax [phone]

## 2024-07-28 ENCOUNTER — Ambulatory Visit: Admitting: Physical Therapy

## 2024-07-28 DIAGNOSIS — M6281 Muscle weakness (generalized): Secondary | ICD-10-CM

## 2024-07-28 DIAGNOSIS — R279 Unspecified lack of coordination: Secondary | ICD-10-CM

## 2024-07-28 DIAGNOSIS — R293 Abnormal posture: Secondary | ICD-10-CM

## 2024-07-28 NOTE — Therapy (Signed)
 OUTPATIENT PHYSICAL THERAPY TREATMENT NOTE   Patient Name: Emily Joseph MRN: [id number] DOB:Apr 11, 1971, 53 y.o., female Today's Date: 07/28/2024  END OF SESSION:  PT End of Session - 07/28/24 1145     Visit Number 5    Date for Recertification  08/24/24    Authorization Type [employer] -no auth required    PT Start Time 1145    PT Stop Time 1225    PT Time Calculation (min) 40 min    Activity Tolerance Patient limited by pain          Past Medical History:  Diagnosis Date   Adenomyosis    not papanicolaou smear of cervix and cervical HPV   Allergy    Anxiety 06/08/2016   -about her health and about taking medications   Arthritis    ?   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 Ishmael in 2017, [place] rheum -seeing Dr. Ines, Neurologist for back pain with radicular symptoms   Complication of anesthesia    with epidural bp bottoms out   Dysrhythmia    fast hr   Eosinophilic esophagitis    Sees Dr. Avram   Fibromyalgia    GERD (gastroesophageal reflux disease)    H/O laminectomy 09/07/2020   Heart murmur    History of atrial flutter 10/2016   spontaneous conversion to sinus   History of gallstones    History of kidney stones    History of laparoscopic-assisted vaginal hysterectomy 09/07/2020   History of pneumonia    when pt was pregnant   History of pneumothorax 07/2016   Right   History of sinus tachycardia    Iron deficiency anemia due to chronic blood loss    Menorrhagia, s/p complete hysterectomy, ovaries remain hx   Irritable bowel syndrome 01/27/2013   Dr Avram 02/2016    Microhematuria    Mouth problem    failed gum graft 1/16   Nasal airway abnormality    Nasal obstruction 06/26/2015   Seen by ENT 06-26-15.  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   Shortness of breath dyspnea    SUI (stress urinary incontinence, female)    Syncope    with last child with epideral   UTI (urinary tract infection) 07/14/2016   >=100,000 COLONIES/mL KLEBSIELLA PNEUMONIA   Vasovagal episode    post eidural   Vitamin D  deficiency    Past Surgical History:  Procedure Laterality Date   ABDOMINAL HYSTERECTOMY     APPENDECTOMY  01/1989   BALLOON DILATION N/A 02/09/2013   Procedure: BALLOON DILATION;  Surgeon: Elsie Cree, MD;  Location: [facility];  Service: Endoscopy;  Laterality: N/A;   BILATERAL SALPINGECTOMY N/A 06/14/2014   Procedure: BILATERAL SALPINGECTOMY;  Surgeon: Lynwood FORBES Curlene PONCE, MD;  Location: [facility];  Service: Gynecology;  Laterality: N/A;   BIOPSY  09/04/2021   Procedure: BIOPSY;  Surgeon: Avram Lupita FORBES, MD;  Location: [facility];  Service: Endoscopy;;   BIOPSY  05/07/2023   Procedure: BIOPSY;  Surgeon: Avram Lupita FORBES, MD;  Location: [facility];  Service: Gastroenterology;;   BIOPSY  09/24/2023   Procedure: BIOPSY;  Surgeon: Avram Lupita BRAVO, MD;  Location: [facility];  Service: Gastroenterology;;   BREAST BIOPSY Right    biopsy    CHOLECYSTECTOMY N/A 11/21/2019   Procedure: LAPAROSCOPIC CHOLECYSTECTOMY WITH INTRAOPERATIVE CHOLANGIOGRAM;  Surgeon: Ethyl Lenis, MD;  [facility]: [facility];  Service: General;  Laterality: N/A;   COLONOSCOPY WITH PROPOFOL  N/A 02/09/2013   Procedure: COLONOSCOPY WITH PROPOFOL ;  Surgeon: Elsie Cree, MD;  [facility];  Service: Endoscopy;  Laterality: N/A;  need ultra thin colon scope   COLONOSCOPY WITH PROPOFOL  N/A 05/07/2023   Procedure: COLONOSCOPY WITH PROPOFOL ;  Surgeon: Avram Lupita BRAVO, MD;  [facility]: [facility];  Service: Gastroenterology;  Laterality: N/A;   ESOPHAGEAL DILATION   09/04/2021   Procedure: ESOPHAGEAL DILATION;  Surgeon: Avram Lupita BRAVO, MD;  [facility];  Service: Endoscopy;;   ESOPHAGEAL DILATION  05/07/2023   Procedure: ESOPHAGEAL DILATION;  Surgeon: Avram Lupita BRAVO, MD;  [facility]: [facility];  Service: Gastroenterology;;   ESOPHAGOGASTRODUODENOSCOPY Left 06/30/2021   Procedure: ESOPHAGOGASTRODUODENOSCOPY (EGD);  Surgeon: San Sandor GAILS, DO;  Location: [facility];  Service: Gastroenterology;  Laterality: Left;   ESOPHAGOGASTRODUODENOSCOPY (EGD) WITH PROPOFOL  N/A 02/09/2013   Procedure: ESOPHAGOGASTRODUODENOSCOPY (EGD) WITH PROPOFOL ;  Surgeon: Elsie Cree, MD;  [facility];  Service: Endoscopy;  Laterality: N/A;   ESOPHAGOGASTRODUODENOSCOPY (EGD) WITH PROPOFOL  N/A 09/04/2021   Procedure: ESOPHAGOGASTRODUODENOSCOPY (EGD) WITH PROPOFOL ;  Surgeon: Avram Lupita BRAVO, MD;  [facility];  Service: Endoscopy;  Laterality: N/A;   ESOPHAGOGASTRODUODENOSCOPY (EGD) WITH PROPOFOL  N/A 05/07/2023   Procedure: ESOPHAGOGASTRODUODENOSCOPY (EGD) WITH PROPOFOL ;  Surgeon: Avram Lupita BRAVO, MD;  [facility];  Service: Gastroenterology;  Laterality: N/A;   ESOPHAGOGASTRODUODENOSCOPY (EGD) WITH PROPOFOL  N/A 09/24/2023   Procedure: ESOPHAGOGASTRODUODENOSCOPY (EGD) WITH PROPOFOL ;  Surgeon: Avram Lupita BRAVO, MD;  [facility];  Service: Gastroenterology;  Laterality: N/A;   FOREIGN BODY REMOVAL  06/30/2021   Procedure: FOREIGN BODY REMOVAL;  Surgeon: San Sandor GAILS, DO;  Location: [facility];  Service: Gastroenterology;;   LAPAROSCOPIC ASSISTED VAGINAL HYSTERECTOMY Right 06/14/2014   Procedure: OPEN LAPAROSCOPIC ASSISTED VAGINAL HYSTERECTOMY;  Surgeon: Lynwood BRAVO Curlene PONCE, MD;  Location: [facility];  Service: Gynecology;  Laterality: Right;   LYSIS OF ADHESION N/A 06/14/2014   Procedure: LYSIS OF ADHESION;  Surgeon: Lynwood BRAVO Curlene PONCE, MD;  Location: [facility];  Service: Gynecology;  Laterality: N/A;   MINIMALLY INVASIVE  EXCISION OF ATRIAL MYXOMA Right 07/11/2016   Procedure: MINIMALLY INVASIVE RESECTION OF RIGHT ATRIAL LIPOMA WITH CLOSURE OF PATENT FORAMEN OVALE;  Surgeon: Sudie VEAR Laine, MD;  Location: [facility];  Service: Open Heart Surgery;  Laterality: Right;   OVARIAN CYST REMOVAL Right 1990   TEE WITHOUT CARDIOVERSION N/A 07/11/2016   Procedure: TRANSESOPHAGEAL ECHOCARDIOGRAM (TEE);  Surgeon: Sudie VEAR Laine, MD;  Location: [facility];  Service: Open Heart Surgery;  Laterality: N/A;   Patient Active Problem List   Diagnosis Date Noted   Overweight (BMI 25.0-29.9) 03/14/2024   Plantar wart of left foot 12/25/2023   Esophageal dysphagia 05/07/2023   Esophageal stricture 05/07/2023   Colon cancer screening 05/07/2023   Pain in right hip 03/12/2022   Esophageal web    Female stress incontinence 09/07/2020   Heart disease 09/07/2020   Primary osteoarthritis of left knee 05/31/2020   At high risk for breast cancer 08/30/2019   Pelvic floor dysfunction 12/28/2018   Chronic RUQ pain - exacerbation 12/28/2018   Atrial flutter with rapid  ventricular response (HCC) 10/27/2016   s/p minimally invasive resection of right atrial lipoma    Chronic pain 06/08/2016   Chronic fatigue 06/08/2016   GERD (gastroesophageal reflux disease) with esophagitis 06/08/2016   Palpitations 06/08/2016   Anxiety 06/08/2016   Multiple environmental allergies 03/04/2016   Multiple food allergies 03/04/2016   Irritable bowel syndrome with constipation 01/27/2013   Fibromyalgia 01/07/2007    PCP: Charlies Bellini DO  REFERRING PROVIDER: Arley Helling, MD  REFERRING DIAG: Cervicalgia  THERAPY DIAG:  Muscle weakness (generalized)  Unspecified lack of coordination  Abnormal posture  Rationale for Evaluation and Treatment: Rehabilitation  ONSET DATE: June 2025  SUBJECTIVE:                                                                                                                                                                                                          SUBJECTIVE STATEMENT: Good today but yesterday was one of my worst days.  My HR was running high and I didn't feel well.  I laid in the recliner all day.  I felt weak.  [place]. The pain is getting better.     Hand dominance: Ambidextrous  PERTINENT HISTORY:  Hysterectomy, gave birth to 4 children, Anemia, Fibromyalgia, Ehlers-Danlos Syn, High risk for breast cancer, pelvic floor dysfunction, Cholecystectomy  Injured T 12 after hysterectomy surgery PAIN:  Are you having pain? Yes: NPRS scale: 3 left neck and lateral arm/10 Pain location: outside of right neck, left shoulder radiating down arm  Pain description: catching,locking, sharp, stiff, tight, sore Aggravating factors: Turning head while driving, laying flat,has to put a pillow under forearms in supine position  Relieving factors: Massaging, compression, heat,   PRECAUTIONS: None  RED FLAGS: None     WEIGHT BEARING RESTRICTIONS: No  FALLS:  Has patient fallen in last 6 months? 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  DIAGNOSTIC FINDINGS:  MRI CERVICAL SPINE WITHOUT CONTRAST   TECHNIQUE: Multiplanar, multisequence MR imaging of the cervical spine was performed. No intravenous contrast was administered.  COMPARISON:  None Available.   FINDINGS: Alignment: Mild levoscoliosis with straightening and slight reversal of the normal cervical lordosis, apex at C5-6. No  significant listhesis.   Vertebrae: Vertebral body height maintained without acute or chronic fracture. Bone marrow signal intensity within normal limits. No discrete or worrisome osseous lesions or abnormal marrow edema.   Cord: Normal signal and morphology.   Posterior Fossa, vertebral arteries, paraspinal tissues: Unremarkable.   Disc levels:   C2-C3: Negative interspace.  No canal or foraminal stenosis.   C3-C4: Minimal uncovertebral spurring without significant disc bulge. No stenosis.   C4-C5: Small central disc protrusion mildly indents the ventral thecal sac. No spinal stenosis. Foramina remain patent.   C5-C6: Mild degenerate intervertebral disc space narrowing with circumferential disc osteophyte complex. Posterior component flattens and partially faces the ventral thecal sac, but without significant spinal stenosis. Moderate right worse than left C6 foraminal narrowing.   C6-C7: Mild disc bulge with uncovertebral spurring. Mild right-sided facet hypertrophy. No spinal stenosis. Foramina remain patent.   C7-T1: Normal interspace. Mild bilateral facet hypertrophy. No spinal stenosis. Foramina remain patent.   IMPRESSION: 1. Degenerative spondylosis at C5-6 with resultant moderate right worse than left C6 foraminal stenosis. 2. Additional mild for age noncompressive disc bulging at C4-5 and C6-7 without significant stenosis or impingement.          Scans on Order [id number]    PATIENT SURVEYS:  NDI:  NECK DISABILITY INDEX  Date: 06/29/24 Score  Pain intensity 1 = The pain is very mild at the moment  2. Personal care (washing, dressing, etc.) 2 = It is painful to look after myself and I am slow and careful  3. Lifting 5 = I cannot lift or carry anything   4. Reading 3 = I can't read as much as I want because of moderate pain in my neck  5. Headaches 3 = I have moderate headaches, which come frequently  6. Concentration 3 = I have a lot of difficulty in  concentrating when I want to  7. Work 2 = I can do most of my usual work, but no more  8. Driving 3 = I can't drive my car as long as I want because of moderate pain in my neck  9. Sleeping 3 =  My sleep is moderately disturbed (2-3 hrs sleepless)  10. Recreation 3 = I am able to engage in a few of my usual recreation activities because of pain in   my neck  Total 28/50 = 56%   Minimum Detectable Change (90% confidence): 5 points or 10% points  COGNITION: Overall cognitive status: Within functional limits for tasks assessed  SENSATION: Light touch: WFL  POSTURE: rounded shoulders, forward head, and increased thoracic kyphosis  PALPATION: Tenderness at Right upper trap, tenderness in thoracic spine, muscle tightness in suboccipital muscles   CERVICAL ROM:   Active ROM *=pain AROM (deg) eval  Flexion 40  Extension 38*  Right lateral flexion 35*  Left lateral flexion 30  Right rotation 49*  Left rotation 60   (Blank rows = not tested)  UPPER EXTREMITY ROM:  Active ROM Right eval Left eval  Shoulder flexion Dec ROM Dec ROM  Shoulder extension [facility]  Shoulder abduction Dec ROM Dec ROM  Shoulder adduction    Shoulder extension    Shoulder internal rotation Dec ROM Dec ROM  Shoulder external rotation Dec ROM Dec ROM    (Blank rows = not tested)  UPPER EXTREMITY MMT:  MMT Right  eval Left eval  Shoulder flexion 4- 4-  Shoulder extension 3+ 3+  Shoulder abduction 4- 4-  Shoulder adduction 4 4  Shoulder extension 3+ 3+  Shoulder internal rotation 4- 4-  Shoulder external rotation 4+ 4+  Middle trapezius 3+ 3+  Lower trapezius 3+ 3+   (Blank rows = not tested)  CERVICAL SPECIAL TESTS:  Upper limb tension test (ULTT): Positive  FUNCTIONAL TESTS:    TREATMENT DATE:  07/28/24: Discussion of response to treatment/status update Supine left and right passive UE neural mobilization: just wrist extension, just elbow extension, just scapular depression and shoulder  extension, then combination of all 10x each Standing UE neural floss (median nerve) 10x right/left in front of the mirror for visual feedback (Added to HEP- see below) Standing tendon glides right/left (Added to HEP- see below) Standing forearm extensor stretches with static hold (Added to HEP- see below) Discussed idea of EMOM (written on handout) min 1: cardio walking, min 2 nerve floss, min 3 tendon glides, min 4 rest for 2-3 rounds   07/26/24: Discussion of response to treatment/status update Cervical MELT with foam roll: rotation, circles, fig 8, nods 5x each  Supine left UE neural mobilization: just wrist extension, just elbow extension, just scapular depression and shoulder extension, then combination of all 10x each Sidelying open books 5x right/left needed for thoracic rotation for driving (Added to HEP- see below) Seated thoracic extension with ball hands supporting neck 10x (Added to HEP- see below)   07/21/2024 Discussed MRI results and Dr. Eliott recommendations Discussed the benefit of Cervical isometrics submax with 2 fingers resist (pt is fearful of doing in sitting, try supine next visit) Supine decompression series: (Added to HEP- see pt instructions) 1) deep breathing with palms up for pectoral stretch (added overpressure with exhalation) 2) shoulder press down 5x 3) whole leg lengthener 5x right/left 4) whole leg press down 5x right/left Manual therapy:  suboccipital release 3 min; upper trap contract relax 3x right/left; gentle cervical distraction on/off no hold for comfort, soft tissue mobilization to upper traps  07/05/2024 Seated green pball rollout within comfortable range x10 Review of HEP Manual therapy:  started in prone and worked on soft tissue mobilization and gentle manual trigger point release to cervical and thoracic paraspinals, upper trap, rhomboids, lats, delts, and sub occipital region.  Had patient roll to supine, then proceeded to gentle rib  mobilization timed with breathing due to patients complaints about pain under her breast region.  Patient reported feeling like she could breath easier after.  Soft tissue mobilization to sternocleidomastoids and pec region to assist with decreased pain.  PATIENT EDUCATION:  Education details: HEP, importance of flexibility, strength and manual  Person educated: Patient Education method: Explanation, Demonstration, Tactile cues, Verbal cues, and Handouts Education comprehension: verbalized understanding, returned demonstration, verbal cues required, and tactile cues required  HOME EXERCISE PROGRAM: Access Code: [email or web address] URL: [email or web address] Date: 07/28/2024 Prepared by: Glade Pesa  Exercises - Doorway Pec Stretch at 90 Degrees Abduction  - 3 x daily - 7 x weekly - 1 sets - 3 reps - 20 hold - Seated Scalenes Stretch  - 3 x daily - 7 x weekly - 1 sets - 3 reps - 20 hold - Gentle Levator Scapulae Stretch  - 3 x daily - 7 x weekly - 1 sets - 3 reps - 20 hold - Sidelying Open Book Thoracic Rotation with Knee on Foam Roll  - 1 x daily - 7 x weekly - 1 sets - 5  reps - Seated Thoracic Lumbar Extension with Pectoralis Stretch  - 1 x daily - 7 x weekly - 1 sets - 5 reps - Median Nerve Flossing  - 1 x daily - 7 x weekly - 1 sets - 10 reps - Wrist Tendon Gliding  - 1 x daily - 7 x weekly - 1 sets - 10 reps - Seated Wrist Flexion Stretch  - 1 x daily - 7 x weekly - 1 sets - 3 reps - 20 hold  ASSESSMENT:  CLINICAL IMPRESSION: [place] responds well to median nerve mobilization passive and actively.  She does report an up-tick in her pain level at the end of session.  We discussed this is not uncommon when first initiating these mobility ex's and reduction of symptoms should occur with repetition over the next few days.  HEP continues to be updated. Therapist monitoring response to all interventions and modifying treatment accordingly.    OBJECTIVE IMPAIRMENTS: Abnormal gait,  decreased activity tolerance, decreased balance, decreased coordination, decreased endurance, decreased knowledge of condition, decreased mobility, decreased ROM, decreased strength, decreased safety awareness, increased fascial restrictions, increased muscle spasms, impaired flexibility, impaired sensation, impaired UE functional use, improper body mechanics, postural dysfunction, and pain.   ACTIVITY LIMITATIONS: carrying, lifting, bending, standing, squatting, sleeping, stairs, transfers, bed mobility, bathing, dressing, reach over head, and caring for others  PARTICIPATION LIMITATIONS: meal prep, cleaning, laundry, driving, and occupation  PERSONAL FACTORS: Behavior pattern, Past/current experiences, and 1-2 comorbidities: fibromyalgia are also affecting patient's functional outcome.   REHAB POTENTIAL: Good  CLINICAL DECISION MAKING: Evolving/moderate complexity  EVALUATION COMPLEXITY: Moderate   GOALS: Goals reviewed with patient? Yes  SHORT TERM GOALS: Target date: 07/29/24  Pt will be independent with initial HEP so she is able to complete exercises on her own.  Baseline:  Goal status: Ongoing  2.  Pt will be able to read for longer than 30 min at a time without neck pain so she can continue participating in hobbies she enjoys.  Baseline:  Goal status: INITIAL  3.  Pt will have decreased NDI score < or = to 18/50 she increases her functional capacity for work.  Baseline: 28/50 Goal status: INITIAL    LONG TERM GOALS: Target date: 08/26/24  Pt will demonstrate competency with her advanced HEP so she can remain independent at home and after PT discharge.  Baseline:  Goal status: INITIAL  2.  Pt will have increased shoulder ROM to be able to wash her hair, shower, and dress in a timely manner without difficulty.  Baseline: Decreased normal ROM Goal status: INITIAL  3.  Pt will have decrease in headache frequency each month, having only 1-2 instead of 10 so she has  higher cognitive functional capacity for work.  Baseline: 10 headaches monthly Goal status: INITIAL  4.  Patient will increase cervical ROM into Rt rotation by 15 deg or more, without pain so she is able to drive as long as she needs to take care of her Aunt, and her children.  Baseline:  Goal status: INITIAL  5.  Pt will be able to sleep through the night without disturbance of neck/shoulder pain waking her up so she is well rested for daily activities.  Baseline: 2-3 hours of sleeplessness Goal status: INITIAL  6.  Pt will have 4+/5 MMT strength in bilateral shoulders so she is able to lift heavy objects without pain or difficulty.  Baseline:  Goal status: INITIAL   PLAN:  PT FREQUENCY: 1-2x/week  PT DURATION: 8  weeks  PLANNED INTERVENTIONS: 97164- PT Re-evaluation, 97750- Physical Performance Testing, 97110-Therapeutic exercises, 97530- Therapeutic activity, [id number]- Neuromuscular re-education, 97535- Self Care, 02859- Manual therapy, [id number]- Gait training, [phone]- Orthotic/Prosthetic subsequent, [phone]- Canalith repositioning, [phone]- Aquatic Therapy, [phone]- Splinting, H9716- Electrical stimulation (unattended), 97035- Ultrasound, [id number]- Traction (mechanical), [id number]- Ionotophoresis 4mg /ml Dexamethasone , 02981- Parrafin, Patient/Family education, Balance training, Taping, Joint mobilization, Spinal mobilization, Scar mobilization, Compression bandaging, Vestibular training, Visual/preceptual remediation/compensation, DME instructions, and Cryotherapy  PLAN FOR NEXT SESSION: check STGS next visit including NDI;  try  radial neural floss right and left; cervical MELT method (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  Pt specifically asked for myofascial release so incorporate  manual into treatment at first.  She will consider dry needling as well (but pt does have needle phobia).   Glade Pesa, PT 07/28/24 9:46 PM Phone: [phone] Fax: [phone]  Fax: [phone]  [facility] [address], [address] [place], [place] [address] Phone # [phone] Fax [phone]

## 2024-08-01 ENCOUNTER — Ambulatory Visit (INDEPENDENT_AMBULATORY_CARE_PROVIDER_SITE_OTHER): Admitting: Cardiology

## 2024-08-01 ENCOUNTER — Encounter: Payer: Self-pay | Admitting: Cardiology

## 2024-08-01 VITALS — BP 116/75 | HR 97 | Ht 67.5 in | Wt 170.0 lb

## 2024-08-01 DIAGNOSIS — I4892 Unspecified atrial flutter: Secondary | ICD-10-CM

## 2024-08-01 DIAGNOSIS — I5189 Other ill-defined heart diseases: Secondary | ICD-10-CM

## 2024-08-01 DIAGNOSIS — R002 Palpitations: Secondary | ICD-10-CM

## 2024-08-01 NOTE — Patient Instructions (Signed)

## 2024-08-02 ENCOUNTER — Ambulatory Visit: Admitting: Physical Therapy

## 2024-08-02 DIAGNOSIS — M62838 Other muscle spasm: Secondary | ICD-10-CM

## 2024-08-02 DIAGNOSIS — M6281 Muscle weakness (generalized): Secondary | ICD-10-CM | POA: Diagnosis not present

## 2024-08-02 DIAGNOSIS — R279 Unspecified lack of coordination: Secondary | ICD-10-CM

## 2024-08-02 DIAGNOSIS — R293 Abnormal posture: Secondary | ICD-10-CM

## 2024-08-02 DIAGNOSIS — M542 Cervicalgia: Secondary | ICD-10-CM

## 2024-08-02 NOTE — Therapy (Signed)
 OUTPATIENT PHYSICAL THERAPY TREATMENT NOTE   Patient Name: Emily Joseph MRN: [id number] DOB:August 18, 1971, 53 y.o., female Today's Date: 08/02/2024  END OF SESSION:  PT End of Session - 08/02/24 1230     Visit Number 6    Date for Recertification  08/24/24    Authorization Type [employer] -no auth required    PT Start Time 1230    PT Stop Time 1314    PT Time Calculation (min) 44 min    Activity Tolerance Patient limited by pain          Past Medical History:  Diagnosis Date   Adenomyosis    not papanicolaou smear of cervix and cervical HPV   Allergy    Anxiety 06/08/2016   -about her health and about taking medications   Arthritis    ?   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 Ishmael in 2017, [place] rheum -seeing Dr. Ines, Neurologist for back pain with radicular symptoms   Complication of anesthesia    with epidural bp bottoms out   Dysrhythmia    fast hr   Eosinophilic esophagitis    Sees Dr. Avram   Fibromyalgia    GERD (gastroesophageal reflux disease)    H/O laminectomy 09/07/2020   Heart murmur    History of atrial flutter 10/2016   spontaneous conversion to sinus   History of gallstones    History of kidney stones    History of laparoscopic-assisted vaginal hysterectomy 09/07/2020   History of pneumonia    when pt was pregnant   History of pneumothorax 07/2016   Right   History of sinus tachycardia    Iron deficiency anemia due to chronic blood loss    Menorrhagia, s/p complete hysterectomy, ovaries remain hx   Irritable bowel syndrome 01/27/2013   Dr Avram 02/2016    Microhematuria    Mouth problem    failed gum graft 1/16   Nasal airway abnormality    Nasal obstruction 06/26/2015   Seen by ENT 06-26-15.  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   Shortness of breath dyspnea    SUI (stress urinary incontinence, female)    Syncope    with last child with epideral   UTI (urinary tract infection) 07/14/2016   >=100,000 COLONIES/mL KLEBSIELLA PNEUMONIA   Vasovagal episode    post eidural   Vitamin D  deficiency    Past Surgical History:  Procedure Laterality Date   ABDOMINAL HYSTERECTOMY     APPENDECTOMY  01/1989   BALLOON DILATION N/A 02/09/2013   Procedure: BALLOON DILATION;  Surgeon: Elsie Cree, MD;  Location: [facility];  Service: Endoscopy;  Laterality: N/A;   BILATERAL SALPINGECTOMY N/A 06/14/2014   Procedure: BILATERAL SALPINGECTOMY;  Surgeon: Lynwood FORBES Curlene PONCE, MD;  Location: [facility];  Service: Gynecology;  Laterality: N/A;   BIOPSY  09/04/2021   Procedure: BIOPSY;  Surgeon: Avram Lupita FORBES, MD;  Location: [facility];  Service: Endoscopy;;   BIOPSY  05/07/2023   Procedure: BIOPSY;  Surgeon: Avram Lupita FORBES, MD;  Location: [facility];  Service: Gastroenterology;;   BIOPSY  09/24/2023   Procedure: BIOPSY;  Surgeon: Avram Lupita BRAVO, MD;  Location: [facility];  Service: Gastroenterology;;   BREAST BIOPSY Right    biopsy    CHOLECYSTECTOMY N/A 11/21/2019   Procedure: LAPAROSCOPIC CHOLECYSTECTOMY WITH INTRAOPERATIVE CHOLANGIOGRAM;  Surgeon: Ethyl Lenis, MD;  Location: [facility];  Service: General;  Laterality: N/A;   COLONOSCOPY WITH PROPOFOL  N/A 02/09/2013   Procedure: COLONOSCOPY WITH PROPOFOL ;  Surgeon: Elsie Cree, MD;  [facility]: [facility];  Service: Endoscopy;  Laterality: N/A;  need ultra thin colon scope   COLONOSCOPY WITH PROPOFOL  N/A 05/07/2023   Procedure: COLONOSCOPY WITH PROPOFOL ;  Surgeon: Avram Lupita BRAVO, MD;  [facility];  Service: Gastroenterology;  Laterality: N/A;   ESOPHAGEAL DILATION   09/04/2021   Procedure: ESOPHAGEAL DILATION;  Surgeon: Avram Lupita BRAVO, MD;  [facility];  Service: Endoscopy;;   ESOPHAGEAL DILATION  05/07/2023   Procedure: ESOPHAGEAL DILATION;  Surgeon: Avram Lupita BRAVO, MD;  [facility];  Service: Gastroenterology;;   ESOPHAGOGASTRODUODENOSCOPY Left 06/30/2021   Procedure: ESOPHAGOGASTRODUODENOSCOPY (EGD);  Surgeon: San Sandor GAILS, DO;  Location: [facility];  Service: Gastroenterology;  Laterality: Left;   ESOPHAGOGASTRODUODENOSCOPY (EGD) WITH PROPOFOL  N/A 02/09/2013   Procedure: ESOPHAGOGASTRODUODENOSCOPY (EGD) WITH PROPOFOL ;  Surgeon: Elsie Cree, MD;  [facility];  Service: Endoscopy;  Laterality: N/A;   ESOPHAGOGASTRODUODENOSCOPY (EGD) WITH PROPOFOL  N/A 09/04/2021   Procedure: ESOPHAGOGASTRODUODENOSCOPY (EGD) WITH PROPOFOL ;  Surgeon: Avram Lupita BRAVO, MD;  [facility];  Service: Endoscopy;  Laterality: N/A;   ESOPHAGOGASTRODUODENOSCOPY (EGD) WITH PROPOFOL  N/A 05/07/2023   Procedure: ESOPHAGOGASTRODUODENOSCOPY (EGD) WITH PROPOFOL ;  Surgeon: Avram Lupita BRAVO, MD;  [facility];  Service: Gastroenterology;  Laterality: N/A;   ESOPHAGOGASTRODUODENOSCOPY (EGD) WITH PROPOFOL  N/A 09/24/2023   Procedure: ESOPHAGOGASTRODUODENOSCOPY (EGD) WITH PROPOFOL ;  Surgeon: Avram Lupita BRAVO, MD;  [facility];  Service: Gastroenterology;  Laterality: N/A;   FOREIGN BODY REMOVAL  06/30/2021   Procedure: FOREIGN BODY REMOVAL;  Surgeon: San Sandor GAILS, DO;  Location: [facility];  Service: Gastroenterology;;   LAPAROSCOPIC ASSISTED VAGINAL HYSTERECTOMY Right 06/14/2014   Procedure: OPEN LAPAROSCOPIC ASSISTED VAGINAL HYSTERECTOMY;  Surgeon: Lynwood BRAVO Curlene PONCE, MD;  Location: [facility];  Service: Gynecology;  Laterality: Right;   LYSIS OF ADHESION N/A 06/14/2014   Procedure: LYSIS OF ADHESION;  Surgeon: Lynwood BRAVO Curlene PONCE, MD;  Location: [facility];  Service: Gynecology;  Laterality: N/A;   MINIMALLY INVASIVE  EXCISION OF ATRIAL MYXOMA Right 07/11/2016   Procedure: MINIMALLY INVASIVE RESECTION OF RIGHT ATRIAL LIPOMA WITH CLOSURE OF PATENT FORAMEN OVALE;  Surgeon: Sudie VEAR Laine, MD;  Location: [facility];  Service: Open Heart Surgery;  Laterality: Right;   OVARIAN CYST REMOVAL Right 1990   TEE WITHOUT CARDIOVERSION N/A 07/11/2016   Procedure: TRANSESOPHAGEAL ECHOCARDIOGRAM (TEE);  Surgeon: Sudie VEAR Laine, MD;  Location: [facility];  Service: Open Heart Surgery;  Laterality: N/A;   Patient Active Problem List   Diagnosis Date Noted   Overweight (BMI 25.0-29.9) 03/14/2024   Plantar wart of left foot 12/25/2023   Esophageal dysphagia 05/07/2023   Esophageal stricture 05/07/2023   Colon cancer screening 05/07/2023   Pain in right hip 03/12/2022   Esophageal web    Female stress incontinence 09/07/2020   Heart disease 09/07/2020   Primary osteoarthritis of left knee 05/31/2020   At high risk for breast cancer 08/30/2019   Pelvic floor dysfunction 12/28/2018   Chronic RUQ pain - exacerbation 12/28/2018   Atrial flutter with rapid  ventricular response (HCC) 10/27/2016   s/p minimally invasive resection of right atrial lipoma    Chronic pain 06/08/2016   Chronic fatigue 06/08/2016   GERD (gastroesophageal reflux disease) with esophagitis 06/08/2016   Palpitations 06/08/2016   Anxiety 06/08/2016   Multiple environmental allergies 03/04/2016   Multiple food allergies 03/04/2016   Irritable bowel syndrome with constipation 01/27/2013   Fibromyalgia 01/07/2007    PCP: Charlies Bellini DO  REFERRING PROVIDER: Arley Helling, MD  REFERRING DIAG: Cervicalgia  THERAPY DIAG:  Muscle weakness (generalized)  Unspecified lack of coordination  Abnormal posture  Other muscle spasm  Neck pain  Rationale for Evaluation and Treatment: Rehabilitation  ONSET DATE: June 2025  SUBJECTIVE:                                                                                                                                                                                                          SUBJECTIVE STATEMENT: My husband bought 2 more electric bikes.  Mine is a trike.  I saw Dr. Pietro yesterday and no new changes other than to monitor in my smart watch. Having tingling in left arm, woke up with it totally asleep.  My ball came and got it blown up.  I have tightness in my left forearm.        Hand dominance: Ambidextrous  PERTINENT HISTORY:  Hysterectomy, gave birth to 4 children, Anemia, Fibromyalgia, Ehlers-Danlos Syn, High risk for breast cancer, pelvic floor dysfunction, Cholecystectomy  Injured T 12 after hysterectomy surgery PAIN:  Are you having pain? Yes: NPRS scale: 3 left neck and lateral arm/10 Pain location: outside of right neck, left shoulder radiating down arm  Pain description: catching,locking, sharp, stiff, tight, sore Aggravating factors: Turning head while driving, laying flat,has to put a pillow under forearms in supine position  Relieving factors: Massaging, compression, heat,   PRECAUTIONS: None  RED FLAGS: None     WEIGHT BEARING RESTRICTIONS: No  FALLS:  Has patient fallen in last 6 months? 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  DIAGNOSTIC FINDINGS:  MRI CERVICAL SPINE  WITHOUT CONTRAST   TECHNIQUE: Multiplanar, multisequence MR imaging of the cervical spine was performed. No intravenous contrast was administered.   COMPARISON:  None Available.   FINDINGS: Alignment: Mild levoscoliosis  with straightening and slight reversal of the normal cervical lordosis, apex at C5-6. No significant listhesis.   Vertebrae: Vertebral body height maintained without acute or chronic fracture. Bone marrow signal intensity within normal limits. No discrete or worrisome osseous lesions or abnormal marrow edema.   Cord: Normal signal and morphology.   Posterior Fossa, vertebral arteries, paraspinal tissues: Unremarkable.   Disc levels:   C2-C3: Negative interspace.  No canal or foraminal stenosis.   C3-C4: Minimal uncovertebral spurring without significant disc bulge. No stenosis.   C4-C5: Small central disc protrusion mildly indents the ventral thecal sac. No spinal stenosis. Foramina remain patent.   C5-C6: Mild degenerate intervertebral disc space narrowing with circumferential disc osteophyte complex. Posterior component flattens and partially faces the ventral thecal sac, but without significant spinal stenosis. Moderate right worse than left C6 foraminal narrowing.   C6-C7: Mild disc bulge with uncovertebral spurring. Mild right-sided facet hypertrophy. No spinal stenosis. Foramina remain patent.   C7-T1: Normal interspace. Mild bilateral facet hypertrophy. No spinal stenosis. Foramina remain patent.   IMPRESSION: 1. Degenerative spondylosis at C5-6 with resultant moderate right worse than left C6 foraminal stenosis. 2. Additional mild for age noncompressive disc bulging at C4-5 and C6-7 without significant stenosis or impingement.          Scans on Order [id number]    PATIENT SURVEYS:  NDI:  NECK DISABILITY INDEX  Date: 06/29/24 Score  Pain intensity 1 = The pain is very mild at the moment  2. Personal care (washing, dressing, etc.) 2 = It is painful to look after myself and I am slow and careful  3. Lifting 5 = I cannot lift or carry anything   4. Reading 3 = I can't read as much as I want because of moderate pain in my neck  5. Headaches 3 = I have moderate  headaches, which come frequently  6. Concentration 3 = I have a lot of difficulty in concentrating when I want to  7. Work 2 = I can do most of my usual work, but no more  8. Driving 3 = I can't drive my car as long as I want because of moderate pain in my neck  9. Sleeping 3 =  My sleep is moderately disturbed (2-3 hrs sleepless)  10. Recreation 3 = I am able to engage in a few of my usual recreation activities because of pain in   my neck  Total 28/50 = 56%   Minimum Detectable Change (90% confidence): 5 points or 10% points  COGNITION: Overall cognitive status: Within functional limits for tasks assessed  SENSATION: Light touch: WFL  POSTURE: rounded shoulders, forward head, and increased thoracic kyphosis  PALPATION: Tenderness at Right upper trap, tenderness in thoracic spine, muscle tightness in suboccipital muscles   CERVICAL ROM:   Active ROM *=pain AROM (deg) eval  Flexion 40  Extension 38*  Right lateral flexion 35*  Left lateral flexion 30  Right rotation 49*  Left rotation 60   (Blank rows = not tested)  UPPER EXTREMITY ROM:  Active ROM Right eval Left eval  Shoulder flexion Dec ROM Dec ROM  Shoulder extension [facility]  Shoulder abduction Dec ROM Dec ROM  Shoulder adduction    Shoulder extension    Shoulder internal rotation Dec ROM Dec ROM  Shoulder external rotation Dec ROM Dec ROM    (Blank rows = not tested)  UPPER EXTREMITY MMT:  MMT Right eval Left eval  Shoulder flexion 4- 4-  Shoulder extension 3+ 3+  Shoulder abduction 4- 4-  Shoulder adduction 4 4  Shoulder extension 3+ 3+  Shoulder internal rotation 4- 4-  Shoulder external rotation 4+ 4+  Middle trapezius 3+ 3+  Lower trapezius 3+ 3+   (Blank rows = not tested)  CERVICAL SPECIAL TESTS:  Upper limb tension test (ULTT): Positive  FUNCTIONAL TESTS:    TREATMENT DATE:  08/02/24: Discussion of response to treatment/status update and response to HEP Supine left and right passive  UE neural mobilization:  wrist extension, elbow extension,  scapular depression and shoulder extension, then combination of all 10x each Supine left and right radial nerve floss 10x right/left Instrument-assisted myofascial release: left and right deltoids, triceps, forearms extensors in supine   07/28/24: Discussion of response to treatment/status update Supine left and right passive UE neural mobilization: just wrist extension, just elbow extension, just scapular depression and shoulder extension, then combination of all 10x each Standing UE neural floss (median nerve) 10x right/left in front of the mirror for visual feedback (Added to HEP- see below) Standing tendon glides right/left (Added to HEP- see below) Standing forearm extensor stretches with static hold (Added to HEP- see below) Discussed idea of EMOM (written on handout) min 1: cardio walking, min 2 nerve floss, min 3 tendon glides, min 4 rest for 2-3 rounds   07/26/24: Discussion of response to treatment/status update Cervical MELT with foam roll: rotation, circles, fig 8, nods 5x each  Supine left UE neural mobilization: just wrist extension, just elbow extension, just scapular depression and shoulder extension, then combination of all 10x each Sidelying open books 5x right/left needed for thoracic rotation for driving (Added to HEP- see below) Seated thoracic extension with ball hands supporting neck 10x (Added to HEP- see below)   07/21/2024 Discussed MRI results and Dr. Eliott recommendations Discussed the benefit of Cervical isometrics submax with 2 fingers resist (pt is fearful of doing in sitting, try supine next visit) Supine decompression series: (Added to HEP- see pt instructions) 1) deep breathing with palms up for pectoral stretch (added overpressure with exhalation) 2) shoulder press down 5x 3) whole leg lengthener 5x right/left 4) whole leg press down 5x right/left Manual therapy:  suboccipital release 3 min;  upper trap contract relax 3x right/left; gentle cervical distraction on/off no hold for comfort, soft tissue mobilization to upper traps  07/05/2024 Seated green pball rollout within comfortable range x10 Review of HEP Manual therapy:  started in prone and worked on soft tissue mobilization and gentle manual trigger point release to cervical and thoracic paraspinals, upper trap, rhomboids, lats, delts, and sub occipital region.  Had patient roll to supine, then proceeded to gentle rib mobilization timed with breathing due to patients complaints about pain under her breast region.  Patient reported feeling like she could breath easier after.  Soft tissue mobilization to sternocleidomastoids and pec region to assist with decreased pain.  PATIENT EDUCATION:  Education details: HEP, importance of flexibility, strength and manual  Person educated: Patient Education method: Explanation, Demonstration, Tactile cues, Verbal cues, and Handouts Education comprehension: verbalized understanding, returned demonstration, verbal cues required, and tactile cues required  HOME EXERCISE PROGRAM: Access Code: [email or web address] URL: [email or web address] Date: 07/28/2024 Prepared by: Glade Pesa  Exercises - Doorway Pec Stretch at 90 Degrees Abduction  - 3 x daily - 7  x weekly - 1 sets - 3 reps - 20 hold - Seated Scalenes Stretch  - 3 x daily - 7 x weekly - 1 sets - 3 reps - 20 hold - Gentle Levator Scapulae Stretch  - 3 x daily - 7 x weekly - 1 sets - 3 reps - 20 hold - Sidelying Open Book Thoracic Rotation with Knee on Foam Roll  - 1 x daily - 7 x weekly - 1 sets - 5 reps - Seated Thoracic Lumbar Extension with Pectoralis Stretch  - 1 x daily - 7 x weekly - 1 sets - 5 reps - Median Nerve Flossing  - 1 x daily - 7 x weekly - 1 sets - 10 reps - Wrist Tendon Gliding  - 1 x daily - 7 x weekly - 1 sets - 10 reps - Seated Wrist Flexion Stretch  - 1 x daily - 7 x weekly - 1 sets - 3 reps - 20  hold  ASSESSMENT:  CLINICAL IMPRESSION: Radial nerve tension > median nerve tension left > right.  Trial of instrument assisted myofascial release with a positive initial response.  Therapist monitoring response to all interventions and modifying treatment accordingly.  Slower progression necessary secondary to numerous co-morbidities.     OBJECTIVE IMPAIRMENTS: Abnormal gait, decreased activity tolerance, decreased balance, decreased coordination, decreased endurance, decreased knowledge of condition, decreased mobility, decreased ROM, decreased strength, decreased safety awareness, increased fascial restrictions, increased muscle spasms, impaired flexibility, impaired sensation, impaired UE functional use, improper body mechanics, postural dysfunction, and pain.   ACTIVITY LIMITATIONS: carrying, lifting, bending, standing, squatting, sleeping, stairs, transfers, bed mobility, bathing, dressing, reach over head, and caring for others  PARTICIPATION LIMITATIONS: meal prep, cleaning, laundry, driving, and occupation  PERSONAL FACTORS: Behavior pattern, Past/current experiences, and 1-2 comorbidities: fibromyalgia are also affecting patient's functional outcome.   REHAB POTENTIAL: Good  CLINICAL DECISION MAKING: Evolving/moderate complexity  EVALUATION COMPLEXITY: Moderate   GOALS: Goals reviewed with patient? Yes  SHORT TERM GOALS: Target date: 07/29/24  Pt will be independent with initial HEP so she is able to complete exercises on her own.  Baseline:  Goal status: Ongoing  2.  Pt will be able to read for longer than 30 min at a time without neck pain so she can continue participating in hobbies she enjoys.  Baseline:  Goal status: INITIAL  3.  Pt will have decreased NDI score < or = to 18/50 she increases her functional capacity for work.  Baseline: 28/50 Goal status: INITIAL    LONG TERM GOALS: Target date: 08/26/24  Pt will demonstrate competency with her advanced HEP so  she can remain independent at home and after PT discharge.  Baseline:  Goal status: INITIAL  2.  Pt will have increased shoulder ROM to be able to wash her hair, shower, and dress in a timely manner without difficulty.  Baseline: Decreased normal ROM Goal status: INITIAL  3.  Pt will have decrease in headache frequency each month, having only 1-2 instead of 10 so she has higher cognitive functional capacity for work.  Baseline: 10 headaches monthly Goal status: INITIAL  4.  Patient will increase cervical ROM into Rt rotation by 15 deg or more, without pain so she is able to drive as long as she needs to take care of her Aunt, and her children.  Baseline:  Goal status: INITIAL  5.  Pt will be able to sleep through the night without disturbance of neck/shoulder pain waking her up so she  is well rested for daily activities.  Baseline: 2-3 hours of sleeplessness Goal status: INITIAL  6.  Pt will have 4+/5 MMT strength in bilateral shoulders so she is able to lift heavy objects without pain or difficulty.  Baseline:  Goal status: INITIAL   PLAN:  PT FREQUENCY: 1-2x/week  PT DURATION: 8 weeks  PLANNED INTERVENTIONS: 97164- PT Re-evaluation, 97750- Physical Performance Testing, 97110-Therapeutic exercises, 97530- Therapeutic activity, [id number]- Neuromuscular re-education, 97535- Self Care, 02859- Manual therapy, [id number]- Gait training, [phone]- Orthotic/Prosthetic subsequent, [phone]- Canalith repositioning, [id number]- Aquatic Therapy, [phone]- Splinting, H9716- Electrical stimulation (unattended), [id number]- Ultrasound, [id number]- Traction (mechanical), [id number]- Ionotophoresis 4mg /ml Dexamethasone , 02981- Parrafin, Patient/Family education, Balance training, Taping, Joint mobilization, Spinal mobilization, Scar mobilization, Compression bandaging, Vestibular training, Visual/preceptual remediation/compensation, DME instructions, and Cryotherapy  PLAN FOR NEXT SESSION: check goals next visit including NDI; median  and radial neural floss right and left;  supine Pilates motions;  AROM only for shoulder retraction, rotator cuff muscle strengthening, STM to upper traps, Suboccipitals, pec major and minor, Ulnar N glides.  Pt specifically asked for myofascial release so incorporate manual into treatment at first.  She will consider dry needling as well (but pt does have needle phobia).   Glade Pesa, PT 08/02/24 8:51 PM Phone: [phone] Fax: [phone]  Fax: [phone]  [facility] [address] [place], [place] [address] Phone # [phone] Fax [phone]

## 2024-08-11 ENCOUNTER — Ambulatory Visit: Attending: Neurosurgery | Admitting: Physical Therapy

## 2024-08-11 DIAGNOSIS — M6281 Muscle weakness (generalized): Secondary | ICD-10-CM | POA: Diagnosis present

## 2024-08-11 DIAGNOSIS — R279 Unspecified lack of coordination: Secondary | ICD-10-CM | POA: Diagnosis present

## 2024-08-11 NOTE — Therapy (Signed)
 OUTPATIENT PHYSICAL THERAPY TREATMENT NOTE   Patient Name: Emily Joseph MRN: [id number] DOB:[birth year], 53 y.o., female Today's Date: 08/11/2024  END OF SESSION:  PT End of Session - 08/11/24 1232     Visit Number 7    Date for Recertification  08/24/24    Authorization Type [employer] -no auth required    Authorization Time Period DN ABN paper signed for Oct    PT Start Time 1231    PT Stop Time 1314    PT Time Calculation (min) 43 min    Activity Tolerance Patient limited by pain          Past Medical History:  Diagnosis Date   Adenomyosis    not papanicolaou smear of cervix and cervical HPV   Allergy    Anxiety 06/08/2016   -about her health and about taking medications   Arthritis    ?   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 Ishmael in 2017, [place] rheum -seeing Dr. Ines, Neurologist for back pain with radicular symptoms   Complication of anesthesia    with epidural bp bottoms out   Dysrhythmia    fast hr   Eosinophilic esophagitis    Sees Dr. Avram   Fibromyalgia    GERD (gastroesophageal reflux disease)    H/O laminectomy 09/07/2020   Heart murmur    History of atrial flutter 10/2016   spontaneous conversion to sinus   History of gallstones    History of kidney stones    History of laparoscopic-assisted vaginal hysterectomy 09/07/2020   History of pneumonia    when pt was pregnant   History of pneumothorax 07/2016   Right   History of sinus tachycardia    Iron deficiency anemia due to chronic blood loss    Menorrhagia, s/p complete hysterectomy, ovaries remain hx   Irritable bowel syndrome 01/27/2013   Dr Avram 02/2016    Microhematuria    Mouth problem    failed gum graft 1/16   Nasal airway abnormality    Nasal obstruction  06/26/2015   Seen by ENT 06-26-15.  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   Shortness of breath dyspnea    SUI (stress urinary incontinence, female)    Syncope    with last child with epideral   UTI (urinary tract infection) 07/14/2016   >=100,000 COLONIES/mL KLEBSIELLA PNEUMONIA   Vasovagal episode    post eidural   Vitamin D  deficiency    Past Surgical History:  Procedure Laterality Date   ABDOMINAL HYSTERECTOMY     APPENDECTOMY  01/1989   BALLOON DILATION N/A 02/09/2013   Procedure: BALLOON DILATION;  Surgeon: Elsie Cree, MD;  Location: [facility];  Service: Endoscopy;  Laterality: N/A;   BILATERAL SALPINGECTOMY N/A 06/14/2014   Procedure: BILATERAL SALPINGECTOMY;  Surgeon: Lynwood FORBES Curlene PONCE, MD;  Location: [facility];  Service: Gynecology;  Laterality: N/A;   BIOPSY  09/04/2021   Procedure: BIOPSY;  Surgeon: Avram Lupita FORBES, MD;  [facility]: [facility];  Service: Endoscopy;;   BIOPSY  05/07/2023   Procedure: BIOPSY;  Surgeon: Avram Lupita BRAVO, MD;  Location: [facility];  Service: Gastroenterology;;   BIOPSY  09/24/2023   Procedure: BIOPSY;  Surgeon: Avram Lupita BRAVO, MD;  Location: [facility];  Service: Gastroenterology;;   BREAST BIOPSY Right    biopsy    CHOLECYSTECTOMY N/A 11/21/2019   Procedure: LAPAROSCOPIC CHOLECYSTECTOMY WITH INTRAOPERATIVE CHOLANGIOGRAM;  Surgeon: Ethyl Lenis, MD;  [facility];  Service: General;  Laterality: N/A;   COLONOSCOPY WITH PROPOFOL  N/A 02/09/2013   Procedure: COLONOSCOPY WITH PROPOFOL ;  Surgeon: Elsie Cree, MD;  [facility];  Service: Endoscopy;  Laterality: N/A;  need ultra thin colon scope   COLONOSCOPY WITH PROPOFOL  N/A 05/07/2023   Procedure: COLONOSCOPY WITH PROPOFOL ;  Surgeon: Avram Lupita BRAVO, MD;  [facility];  Service:  Gastroenterology;  Laterality: N/A;   ESOPHAGEAL DILATION  09/04/2021   Procedure: ESOPHAGEAL DILATION;  Surgeon: Avram Lupita BRAVO, MD;  [facility];  Service: Endoscopy;;   ESOPHAGEAL DILATION  05/07/2023   Procedure: ESOPHAGEAL DILATION;  Surgeon: Avram Lupita BRAVO, MD;  [facility];  Service: Gastroenterology;;   ESOPHAGOGASTRODUODENOSCOPY Left 06/30/2021   Procedure: ESOPHAGOGASTRODUODENOSCOPY (EGD);  Surgeon: San Sandor GAILS, DO;  [facility];  Service: Gastroenterology;  Laterality: Left;   ESOPHAGOGASTRODUODENOSCOPY (EGD) WITH PROPOFOL  N/A 02/09/2013   Procedure: ESOPHAGOGASTRODUODENOSCOPY (EGD) WITH PROPOFOL ;  Surgeon: Elsie Cree, MD;  [facility];  Service: Endoscopy;  Laterality: N/A;   ESOPHAGOGASTRODUODENOSCOPY (EGD) WITH PROPOFOL  N/A 09/04/2021   Procedure: ESOPHAGOGASTRODUODENOSCOPY (EGD) WITH PROPOFOL ;  Surgeon: Avram Lupita BRAVO, MD;  [facility];  Service: Endoscopy;  Laterality: N/A;   ESOPHAGOGASTRODUODENOSCOPY (EGD) WITH PROPOFOL  N/A 05/07/2023   Procedure: ESOPHAGOGASTRODUODENOSCOPY (EGD) WITH PROPOFOL ;  Surgeon: Avram Lupita BRAVO, MD;  [facility];  Service: Gastroenterology;  Laterality: N/A;   ESOPHAGOGASTRODUODENOSCOPY (EGD) WITH PROPOFOL  N/A 09/24/2023   Procedure: ESOPHAGOGASTRODUODENOSCOPY (EGD) WITH PROPOFOL ;  Surgeon: Avram Lupita BRAVO, MD;  [facility];  Service: Gastroenterology;  Laterality: N/A;   FOREIGN BODY REMOVAL  06/30/2021   Procedure: FOREIGN BODY REMOVAL;  Surgeon: San Sandor GAILS, DO;  Location: [facility];  Service: Gastroenterology;;   LAPAROSCOPIC ASSISTED VAGINAL HYSTERECTOMY Right 06/14/2014   Procedure: OPEN LAPAROSCOPIC ASSISTED VAGINAL HYSTERECTOMY;  Surgeon: Lynwood BRAVO Curlene PONCE, MD;  [facility]: [facility];  Service: Gynecology;  Laterality: Right;   LYSIS OF ADHESION N/A 06/14/2014   Procedure: LYSIS OF ADHESION;  Surgeon: Lynwood BRAVO Curlene PONCE, MD;  [facility]: [facility];   Service: Gynecology;  Laterality: N/A;   MINIMALLY INVASIVE EXCISION OF ATRIAL MYXOMA Right 07/11/2016   Procedure: MINIMALLY INVASIVE RESECTION OF RIGHT ATRIAL LIPOMA WITH CLOSURE OF PATENT FORAMEN OVALE;  Surgeon: Sudie VEAR Laine, MD;  Location: [facility];  Service: Open Heart Surgery;  Laterality: Right;   OVARIAN CYST REMOVAL Right 1990   TEE WITHOUT CARDIOVERSION N/A 07/11/2016   Procedure: TRANSESOPHAGEAL ECHOCARDIOGRAM (TEE);  Surgeon: Sudie VEAR Laine, MD;  Location: [facility] OR;  Service: Open Heart Surgery;  Laterality: N/A;   Patient Active Problem List   Diagnosis Date Noted   Overweight (BMI 25.0-29.9) 03/14/2024   Plantar wart of left foot 12/25/2023   Esophageal dysphagia 05/07/2023   Esophageal stricture 05/07/2023   Colon cancer screening 05/07/2023   Pain in right hip 03/12/2022   Esophageal web    Female stress incontinence 09/07/2020   Heart disease 09/07/2020   Primary osteoarthritis of left knee 05/31/2020   At high risk for breast cancer 08/30/2019   Pelvic floor dysfunction 12/28/2018  Chronic RUQ pain - exacerbation 12/28/2018   Atrial flutter with rapid ventricular response (HCC) 10/27/2016   s/p minimally invasive resection of right atrial lipoma    Chronic pain 06/08/2016   Chronic fatigue 06/08/2016   GERD (gastroesophageal reflux disease) with esophagitis 06/08/2016   Palpitations 06/08/2016   Anxiety 06/08/2016   Multiple environmental allergies 03/04/2016   Multiple food allergies 03/04/2016   Irritable bowel syndrome with constipation 01/27/2013   Fibromyalgia 01/07/2007    PCP: Charlies Bellini DO  REFERRING PROVIDER: Arley Helling, MD  REFERRING DIAG: Cervicalgia  THERAPY DIAG:  Muscle weakness (generalized)  Unspecified lack of coordination  Rationale for Evaluation and Treatment: Rehabilitation  ONSET DATE: June 2025  SUBJECTIVE:                                                                                                                                                                                                          SUBJECTIVE STATEMENT: I've had a bad day with my arm.  Intermittent feeling of weakness and pain down the arm to the thumb on left.  Was a little bit flared from instrument assist.  Feels pinchy in left pectoral.  Right side neck.   Traveling tomorrow to [place] [employer] [place].      Hand dominance: Ambidextrous  PERTINENT HISTORY:  Hysterectomy, gave birth to 4 children, Anemia, Fibromyalgia, Ehlers-Danlos Syn, High risk for breast cancer, pelvic floor dysfunction, Cholecystectomy  Injured T 12 after hysterectomy surgery PAIN:  Are you having pain? Yes: NPRS scale: 5-6/10 Pain location: outside of right neck, left shoulder radiating down arm  Pain description: catching,locking, sharp, stiff, tight, sore Aggravating factors: Turning head while driving, laying flat,has to put a pillow under forearms in supine position  Relieving factors: Massaging, compression, heat,   PRECAUTIONS: None  RED FLAGS: None     WEIGHT BEARING RESTRICTIONS: No  FALLS:  Has patient fallen in last 6 months? 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  DIAGNOSTIC FINDINGS:  MRI CERVICAL SPINE WITHOUT CONTRAST   TECHNIQUE: Multiplanar, multisequence MR imaging of the cervical spine was performed.  No intravenous contrast was administered.   COMPARISON:  None Available.   FINDINGS: Alignment: Mild levoscoliosis with straightening and slight reversal of the normal  cervical lordosis, apex at C5-6. No significant listhesis.   Vertebrae: Vertebral body height maintained without acute or chronic fracture. Bone marrow signal intensity within normal limits. No discrete or worrisome osseous lesions or abnormal marrow edema.   Cord: Normal signal and morphology.   Posterior Fossa, vertebral arteries, paraspinal tissues: Unremarkable.   Disc levels:   C2-C3: Negative interspace.  No canal or foraminal stenosis.   C3-C4: Minimal uncovertebral spurring without significant disc bulge. No stenosis.   C4-C5: Small central disc protrusion mildly indents the ventral thecal sac. No spinal stenosis. Foramina remain patent.   C5-C6: Mild degenerate intervertebral disc space narrowing with circumferential disc osteophyte complex. Posterior component flattens and partially faces the ventral thecal sac, but without significant spinal stenosis. Moderate right worse than left C6 foraminal narrowing.   C6-C7: Mild disc bulge with uncovertebral spurring. Mild right-sided facet hypertrophy. No spinal stenosis. Foramina remain patent.   C7-T1: Normal interspace. Mild bilateral facet hypertrophy. No spinal stenosis. Foramina remain patent.   IMPRESSION: 1. Degenerative spondylosis at C5-6 with resultant moderate right worse than left C6 foraminal stenosis. 2. Additional mild for age noncompressive disc bulging at C4-5 and C6-7 without significant stenosis or impingement.          Scans on Order [id number]    PATIENT SURVEYS:  NDI:  NECK DISABILITY INDEX  Date: 06/29/24 Score  Pain intensity 1 = The pain is very mild at the moment  2. Personal care (washing, dressing, etc.) 2 = It is painful to look after myself and I am slow and careful  3. Lifting 5 = I cannot lift or carry anything   4. Reading 3 = I can't read as much as I want because of moderate pain in my neck  5. Headaches 3 = I have moderate headaches, which come frequently  6. Concentration 3  = I have a lot of difficulty in concentrating when I want to  7. Work 2 = I can do most of my usual work, but no more  8. Driving 3 = I can't drive my car as long as I want because of moderate pain in my neck  9. Sleeping 3 =  My sleep is moderately disturbed (2-3 hrs sleepless)  10. Recreation 3 = I am able to engage in a few of my usual recreation activities because of pain in   my neck  Total 28/50 = 56%   Minimum Detectable Change (90% confidence): 5 points or 10% points  COGNITION: Overall cognitive status: Within functional limits for tasks assessed  SENSATION: Light touch: WFL  POSTURE: rounded shoulders, forward head, and increased thoracic kyphosis  PALPATION: Tenderness at Right upper trap, tenderness in thoracic spine, muscle tightness in suboccipital muscles   CERVICAL ROM:   Active ROM *=pain AROM (deg) eval  Flexion 40  Extension 38*  Right lateral flexion 35*  Left lateral flexion 30  Right rotation 49*  Left rotation 60   (Blank rows = not tested)  UPPER EXTREMITY ROM:  Active ROM Right eval Left eval  Shoulder flexion Dec ROM Dec ROM  Shoulder extension [facility]  Shoulder abduction Dec ROM Dec ROM  Shoulder adduction    Shoulder extension    Shoulder internal rotation Dec ROM Dec ROM  Shoulder external rotation Dec ROM Dec ROM    (Blank rows = not tested)  UPPER EXTREMITY MMT:  MMT Right eval Left eval  Shoulder flexion 4- 4-  Shoulder extension 3+ 3+  Shoulder abduction 4- 4-  Shoulder adduction 4 4  Shoulder extension 3+ 3+  Shoulder internal rotation 4- 4-  Shoulder external rotation 4+ 4+  Middle trapezius 3+ 3+  Lower trapezius 3+ 3+   (Blank rows = not tested)  CERVICAL SPECIAL TESTS:  Upper limb tension test (ULTT): Positive  FUNCTIONAL TESTS:    TREATMENT DATE:  08/11/24: Review of current HEP and status Supine left pectoral release myofascial release Supine left neural mobilization  Trigger Point Dry Needling Initial  Treatment: Pt instructed on Dry Needling rational, procedures, and possible side effects. Pt instructed to expect mild to moderate muscle soreness later in the day and/or into the next day.  Pt instructed in methods to reduce muscle soreness. Pt instructed to continue prescribed HEP. Patient was educated on signs and symptoms of infection and other risk factors and advised to seek medical attention should they occur.  Patient verbalized understanding of these instructions and education.   Patient Verbal Consent Given: Yes Education Handout Provided: Yes Muscles Treated: left upper trap; left biceps, left anterior and middle deltoids  Electrical Stimulation Performed: no Treatment Response/Outcome: improved soft tissue mobility      08/02/24: Discussion of response to treatment/status update and response to HEP Supine left and right passive UE neural mobilization:  wrist extension, elbow extension,  scapular depression and shoulder extension, then combination of all 10x each Supine left and right radial nerve floss 10x right/left Instrument-assisted myofascial release: left and right deltoids, triceps, forearms extensors in supine   07/28/24: Discussion of response to treatment/status update Supine left and right passive UE neural mobilization: just wrist extension, just elbow extension, just scapular depression and shoulder extension, then combination of all 10x each Standing UE neural floss (median nerve) 10x right/left in front of the mirror for visual feedback (Added to HEP- see below) Standing tendon glides right/left (Added to HEP- see below) Standing forearm extensor stretches with static hold (Added to HEP- see below) Discussed idea of EMOM (written on handout) min 1: cardio walking, min 2 nerve floss, min 3 tendon glides, min 4 rest for 2-3 rounds   07/26/24: Discussion of response to treatment/status update Cervical MELT with foam roll: rotation, circles, fig 8, nods 5x each   Supine left UE neural mobilization: just wrist extension, just elbow extension, just scapular depression and shoulder extension, then combination of all 10x each Sidelying open books 5x right/left needed for thoracic rotation for driving (Added to HEP- see below) Seated thoracic extension with ball hands supporting neck 10x (Added to HEP- see below)   07/21/2024 Discussed MRI results and Dr. Eliott recommendations Discussed the benefit of Cervical isometrics submax with 2 fingers resist (pt is fearful of doing in sitting, try supine next visit) Supine decompression series: (Added to HEP- see pt instructions) 1) deep breathing with palms up for pectoral stretch (added overpressure with exhalation) 2) shoulder press down 5x 3) whole leg lengthener 5x right/left 4) whole leg press down 5x right/left Manual therapy:  suboccipital release 3 min; upper trap contract relax 3x right/left; gentle cervical distraction on/off no hold for comfort, soft tissue mobilization to upper traps   PATIENT EDUCATION:  Education details: HEP, importance of flexibility, strength and manual  Person educated: Patient Education method: Explanation, Demonstration, Tactile cues, Verbal cues, and Handouts Education comprehension: verbalized understanding, returned demonstration, verbal cues required, and tactile cues required  HOME EXERCISE  PROGRAM: Access Code: [email or web address] URL: [email or web address] Date: 07/28/2024 Prepared by: Glade Pesa  Exercises - Doorway Pec Stretch at 90 Degrees Abduction  - 3 x daily - 7 x weekly - 1 sets - 3 reps - 20 hold - Seated Scalenes Stretch  - 3 x daily - 7 x weekly - 1 sets - 3 reps - 20 hold - Gentle Levator Scapulae Stretch  - 3 x daily - 7 x weekly - 1 sets - 3 reps - 20 hold - Sidelying Open Book Thoracic Rotation with Knee on Foam Roll  - 1 x daily - 7 x weekly - 1 sets - 5 reps - Seated Thoracic Lumbar Extension with Pectoralis Stretch  - 1 x daily - 7 x  weekly - 1 sets - 5 reps - Median Nerve Flossing  - 1 x daily - 7 x weekly - 1 sets - 10 reps - Wrist Tendon Gliding  - 1 x daily - 7 x weekly - 1 sets - 10 reps - Seated Wrist Flexion Stretch  - 1 x daily - 7 x weekly - 1 sets - 3 reps - 20 hold  ASSESSMENT:  CLINICAL IMPRESSION: Increased symptom irritability today particularly in the left UE.  She reports increased numbness, tingling and pain radiating down left UE to the thumb with manual techniques.  She is receptive in trying a limited amount of DN.  She is fairly sensitive but much improved soft tissue mobility noted and decreased number of tender points following.  Therapist monitoring response to all interventions and modifying treatment accordingly. She is slower to meet goals secondary to numerous co-morbidities and chronic pain.     OBJECTIVE IMPAIRMENTS: Abnormal gait, decreased activity tolerance, decreased balance, decreased coordination, decreased endurance, decreased knowledge of condition, decreased mobility, decreased ROM, decreased strength, decreased safety awareness, increased fascial restrictions, increased muscle spasms, impaired flexibility, impaired sensation, impaired UE functional use, improper body mechanics, postural dysfunction, and pain.   ACTIVITY LIMITATIONS: carrying, lifting, bending, standing, squatting, sleeping, stairs, transfers, bed mobility, bathing, dressing, reach over head, and caring for others  PARTICIPATION LIMITATIONS: meal prep, cleaning, laundry, driving, and occupation  PERSONAL FACTORS: Behavior pattern, Past/current experiences, and 1-2 comorbidities: fibromyalgia are also affecting patient's functional outcome.   REHAB POTENTIAL: Good  CLINICAL DECISION MAKING: Evolving/moderate complexity  EVALUATION COMPLEXITY: Moderate   GOALS: Goals reviewed with patient? Yes  SHORT TERM GOALS: Target date: 07/29/24  Pt will be independent with initial HEP so she is able to complete exercises on  her own.  Baseline:  Goal status: Ongoing  2.  Pt will be able to read for longer than 30 min at a time without neck pain so she can continue participating in hobbies she enjoys.  Baseline:  Goal status: ongoing  3.  Pt will have decreased NDI score < or = to 18/50 she increases her functional capacity for work.  Baseline: 28/50 Goal status: ongoing    LONG TERM GOALS: Target date: 08/26/24  Pt will demonstrate competency with her advanced HEP so she can remain independent at home and after PT discharge.  Baseline:  Goal status: INITIAL  2.  Pt will have increased shoulder ROM to be able to wash her hair, shower, and dress in a timely manner without difficulty.  Baseline: Decreased normal ROM Goal status: INITIAL  3.  Pt will have decrease in headache frequency each month, having only 1-2 instead of 10 so she has higher cognitive functional capacity for work.  Baseline: 10 headaches monthly  Goal status: INITIAL  4.  Patient will increase cervical ROM into Rt rotation by 15 deg or more, without pain so she is able to drive as long as she needs to take care of her Aunt, and her children.  Baseline:  Goal status: INITIAL  5.  Pt will be able to sleep through the night without disturbance of neck/shoulder pain waking her up so she is well rested for daily activities.  Baseline: 2-3 hours of sleeplessness Goal status: INITIAL  6.  Pt will have 4+/5 MMT strength in bilateral shoulders so she is able to lift heavy objects without pain or difficulty.  Baseline:  Goal status: INITIAL   PLAN:  PT FREQUENCY: 1-2x/week  PT DURATION: 8 weeks  PLANNED INTERVENTIONS: 97164- PT Re-evaluation, 97750- Physical Performance Testing, 97110-Therapeutic exercises, 97530- Therapeutic activity, [id number]- Neuromuscular re-education, 97535- Self Care, 02859- Manual therapy, [id number]- Gait training, [phone]- Orthotic/Prosthetic subsequent, [phone]- Canalith repositioning, [id number]- Aquatic Therapy, [phone]-  Splinting, H9716- Electrical stimulation (unattended), [id number]- Ultrasound, [id number]- Traction (mechanical), [id number]- Ionotophoresis 4mg /ml Dexamethasone , 02981- Parrafin, Patient/Family education, Balance training, Taping, Joint mobilization, Spinal mobilization, Scar mobilization, Compression bandaging, Vestibular training, Visual/preceptual remediation/compensation, DME instructions, and Cryotherapy  PLAN FOR NEXT SESSION: ERO; check goals next visit including NDI; median and radial neural floss right and left;  supine Pilates motions;  AROM only for shoulder retraction, rotator cuff muscle strengthening, STM to upper traps, Suboccipitals, pec major and minor, Ulnar N glides.  Pt specifically asked for myofascial release so incorporate manual into treatment at first.    Glade Pesa, PT 08/11/24 10:14 PM Phone: [phone] Fax: [phone]  [facility] [address], [address] [place], [place] [address] Phone # [phone] Fax [phone]

## 2024-08-11 NOTE — Patient Instructions (Signed)

## 2024-09-18 ENCOUNTER — Other Ambulatory Visit: Payer: Self-pay | Admitting: Internal Medicine

## 2024-09-26 ENCOUNTER — Telehealth (HOSPITAL_BASED_OUTPATIENT_CLINIC_OR_DEPARTMENT_OTHER): Payer: Self-pay

## 2024-09-26 ENCOUNTER — Other Ambulatory Visit: Payer: Self-pay | Admitting: Neurosurgery

## 2024-09-26 NOTE — Telephone Encounter (Signed)
 Dr. Pietro,  Emily Joseph is requesting preoperative cardiac evaluation for cervical fusion.  She was seen by you in clinic on 08/01/2024.  She remained stable from a cardiac standpoint at that time.  Her PMH includes adenomyosis, anxiety, arthritis, chronic fatigue, cardiac murmur, iron deficiency anemia, atrial flutter (CHA2DS2-VASc score 1), and palpitations.  Would you be able to comment on risk for planned surgery?  Thank you for your help.  Please direct your response to CV DIV preop pool.  Emily Joseph. Emily Westall NP-C     09/26/2024, 4:31 PM [facility] [address] [place], [place] [address] Office [phone]

## 2024-09-26 NOTE — Telephone Encounter (Signed)
   Pre-operative Risk Assessment    Patient Name: Emily Joseph  DOB: 1971-07-20 MRN: [id number]   Date of last office visit: 08/01/24 with Dr. Pietro Date of next office visit: NA   Request for Surgical Clearance    Procedure:  C5-6 Anterior Cervical Fusion   Date of Surgery:  Clearance 12/12/24                                 Surgeon:  Dr. Onetha Socks Group or Practice Name:  [facility] Phone number:  [phone]  [phone] Fax number:  [phone]   Type of Clearance Requested:   - Medical    Type of Anesthesia:  General   Additional requests/questions:    SignedAugustin JONETTA Daring   09/26/2024, 4:12 PM

## 2024-09-26 NOTE — Telephone Encounter (Signed)
     Primary Cardiologist: Redell Shallow, MD  Chart reviewed as part of pre-operative protocol coverage. Given past medical history and time since last visit, based on ACC/AHA guidelines, Emily Joseph would be at acceptable risk for the planned procedure without further cardiovascular testing.   Her RCRI is very low risk, 0.4% risk of major cardiac event.  I will route this recommendation to the requesting party via Epic fax function and remove from pre-op pool.  Emily Joseph. Edis Huish NP-C     09/26/2024, 4:45 PM [facility] [address] [place], [place] [address] Office [phone]

## 2024-11-17 ENCOUNTER — Telehealth: Payer: Self-pay

## 2024-11-17 NOTE — Telephone Encounter (Signed)
 Spoke with pt, she had open heart surgery years ago and had to use insulin  after surgery. She is concerned about possible elevated sugars during post-op, surgery scheduled Feb 2.   Her question is what would be considered severely elevated at the point of needing treatment.

## 2024-11-17 NOTE — Telephone Encounter (Signed)
 Please inform patient her postop team will monitor her intake of care for the postop setting.  It is not likely she will require insulin  again, but they will be monitoring her closely and supplement if needed

## 2024-11-17 NOTE — Telephone Encounter (Signed)
 Copied from CRM [id number]. Topic: Clinical - Medical Advice >> Nov 16, 2024  4:20 PM Charolett L wrote: Reason for CRM: Patient is requesting a call back to go over possibilities of sugar increase after surgery  [phone]

## 2024-11-17 NOTE — Telephone Encounter (Signed)
 Pt advised of provider recommendations.

## 2024-11-30 LAB — LAB REPORT - SCANNED
A1c: 5.6
EGFR: 102

## 2024-12-01 ENCOUNTER — Encounter: Payer: Self-pay | Admitting: Family Medicine

## 2024-12-02 ENCOUNTER — Ambulatory Visit (INDEPENDENT_AMBULATORY_CARE_PROVIDER_SITE_OTHER): Admitting: Family Medicine

## 2024-12-02 ENCOUNTER — Encounter: Payer: Self-pay | Admitting: Family Medicine

## 2024-12-02 VITALS — BP 120/74 | HR 94 | Temp 98.1°F | Wt 174.2 lb

## 2024-12-02 DIAGNOSIS — R5382 Chronic fatigue, unspecified: Secondary | ICD-10-CM

## 2024-12-02 DIAGNOSIS — E559 Vitamin D deficiency, unspecified: Secondary | ICD-10-CM | POA: Insufficient documentation
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
## 2024-12-02 NOTE — Progress Notes (Signed)
 Surgical Instructions   Your procedure is scheduled on February 2. Report to [facility] Main Entrance A at 5:30 A.M., then check in with the Admitting office. Any questions or running late day of surgery: call [phone]  Questions prior to your surgery date: call [phone], Monday-Friday, 8am-4pm. If you experience any cold or flu symptoms such as cough, fever, chills, shortness of breath, etc. between now and your scheduled surgery, please notify us  at the above number.     Remember:  Do not eat or drink anything after midnight the night before your surgery  Take these medicines the morning of surgery with A SIP OF WATER  fexofenadine  (ALLEGRA )  Glycerin-Hypromellose-PEG 400 (DRY EYE RELIEF DROPS)  omeprazole  (PRILOSEC)   May take these medicines IF NEEDED: acetaminophen  (TYLENOL )  albuterol  (VENTOLIN  HFA) -please bring inhaler to the hospital  ketotifen (ZADITOR)  metoprolol  tartrate (LOPRESSOR )  Polyethyl Glycol-Propyl Glycol (SYSTANE OP)  sodium chloride  (OCEAN) 0.65 % SOLN nasal spray   One week prior to surgery, STOP taking any Aspirin  (unless otherwise instructed by your surgeon) Aleve, Naproxen, Ibuprofen , Motrin , Advil , Goody's, BC's, all herbal medications, fish oil, and non-prescription vitamins.                     Do NOT Smoke (Tobacco/Vaping) for 24 hours prior to your procedure.  If you use a CPAP at night, you may bring your mask/headgear for your overnight stay.   You will be asked to remove any contacts, glasses, piercing's, hearing aid's, dentures/partials prior to surgery. Please bring cases for these items if needed.    Your surgeon will determine if you are to be admitted or discharged the same day.  Patients discharged the day of surgery will not be allowed to drive home, and someone needs to stay with them for 24 hours.  SURGICAL WAITING ROOM VISITATION Patients may have no more than 2 support people in the waiting area - these visitors may  rotate.   Pre-op nurse will coordinate an appropriate time for 2 ADULT support persons, who may not rotate, to accompany patient in pre-op.  Children under the age of 3 must have an adult with them who is not the patient and must remain in the main waiting area with an adult.  If the patient needs to stay at the hospital during part of their recovery, the visitor guidelines for inpatient rooms apply.  Please refer to the [facility] website for the visitor guidelines for any additional information.   If you received a COVID test during your pre-op visit  it is requested that you wear a mask when out in public, stay away from anyone that may not be feeling well and notify your surgeon if you develop symptoms. If you have been in contact with anyone that has tested positive in the last 10 days please notify you surgeon.      Pre-operative 4 CHG Bathing Instructions   You can play a key role in reducing the risk of infection after surgery. Your skin needs to be as free of germs as possible. You can reduce the number of germs on your skin by washing with CHG (chlorhexidine  gluconate) soap before surgery. CHG is an antiseptic soap that kills germs and continues to kill germs even after washing.   DO NOT use if you have an allergy to chlorhexidine /CHG or antibacterial soaps. If your skin becomes reddened or irritated, stop using the CHG and notify one of our RNs at [phone].  Please shower with the CHG soap starting 4 days before surgery using the following schedule:     Please keep in mind the following:  DO NOT shave, including legs and underarms, starting the day of your first shower.   You may shave your face at any point before/day of surgery.  Place clean sheets on your bed the day you start using CHG soap. Use a clean washcloth (not used since being washed) for each shower. DO NOT sleep with pets once you start using the CHG.   CHG Shower Instructions:  Wash your face and  private area with normal soap. If you choose to wash your hair, wash first with your normal shampoo.  After you use shampoo/soap, rinse your hair and body thoroughly to remove shampoo/soap residue.  Turn the water OFF and apply  bottle of CHG soap to a CLEAN washcloth.  Apply CHG soap ONLY FROM YOUR NECK DOWN TO YOUR TOES (washing for 3-5 minutes)  DO NOT use CHG soap on face, private areas, open wounds, or sores.  Pay special attention to the area where your surgery is being performed.  If you are having back surgery, having someone wash your back for you may be helpful. Wait 2 minutes after CHG soap is applied, then you may rinse off the CHG soap.  Pat dry with a clean towel  Put on clean clothes/pajamas   If you choose to wear lotion, please use ONLY the CHG-compatible lotions that are listed below.  Additional instructions for the day of surgery:  If you choose, you may shower the morning of surgery with an antibacterial soap.  DO NOT APPLY any lotions, deodorants, cologne, or perfumes.   Do not bring valuables to the hospital. [facility] is not responsible for any belongings/valuables. Do not wear nail polish, gel polish, artificial nails, or any other type of covering on natural nails (fingers and toes) Do not wear jewelry or makeup Put on clean/comfortable clothes.  Please brush your teeth.  Ask your nurse before applying any prescription medications to the skin.     CHG Compatible Lotions   Aveeno Moisturizing lotion  Cetaphil Moisturizing Cream  Cetaphil Moisturizing Lotion  Clairol Herbal Essence Moisturizing Lotion, Dry Skin  Clairol Herbal Essence Moisturizing Lotion, Extra Dry Skin  Clairol Herbal Essence Moisturizing Lotion, Normal Skin  Curel Age Defying Therapeutic Moisturizing Lotion with Alpha Hydroxy  Curel Extreme Care Body Lotion  Curel Soothing Hands Moisturizing Hand Lotion  Curel Therapeutic Moisturizing Cream, Fragrance-Free  Curel Therapeutic  Moisturizing Lotion, Fragrance-Free  Curel Therapeutic Moisturizing Lotion, Original Formula  Eucerin Daily Replenishing Lotion  Eucerin Dry Skin Therapy Plus Alpha Hydroxy Crme  Eucerin Dry Skin Therapy Plus Alpha Hydroxy Lotion  Eucerin Original Crme  Eucerin Original Lotion  Eucerin Plus Crme Eucerin Plus Lotion  Eucerin TriLipid Replenishing Lotion  Keri Anti-Bacterial Hand Lotion  Keri Deep Conditioning Original Lotion Dry Skin Formula Softly Scented  Keri Deep Conditioning Original Lotion, Fragrance Free Sensitive Skin Formula  Keri Lotion Fast Absorbing Fragrance Free Sensitive Skin Formula  Keri Lotion Fast Absorbing Softly Scented Dry Skin Formula  Keri Original Lotion  Keri Skin Renewal Lotion Keri Silky Smooth Lotion  Keri Silky Smooth Sensitive Skin Lotion  Nivea Body Creamy Conditioning Oil  Nivea Body Extra Enriched Teacher, Adult Education Moisturizing Lotion Nivea Crme  Nivea Skin Firming Lotion  NutraDerm 30 Skin Lotion  NutraDerm Skin Lotion  NutraDerm Therapeutic Skin Cream  NutraDerm Therapeutic Skin  Lotion  ProShield Protective Hand Cream  Provon moisturizing lotion  Please read over the following fact sheets that you were given.

## 2024-12-02 NOTE — Patient Instructions (Signed)
 Potassium rich foods: Bananas, oranges, cantaloupe, grapefruit ,prunes, raisins, and dates,Cooked spinach., Cooked broccoli.,Potatoes.,Sweet potatoes,Mushrooms, Peas,Cucumbers  Good luck on your surgery! Increase vit d by adding 1000u to your daily regimen after surgery.         Great to see you today.  I have refilled the medication(s) we provide.   If labs were collected or images ordered, we will inform you of  results once we have received them and reviewed. We will contact you either by echart message, or telephone call.  Please give ample time to the testing facility, and our office to run,  receive and review results. Please do not call inquiring of results, even if you can see them in your chart. We will contact you as soon as we are able. If it has been over 1 week since the test was completed, and you have not yet heard from us , then please call us .    - echart message- for normal results that have been seen by the patient already.   - telephone call: abnormal results or if patient has not viewed results in their echart.  If a referral to a specialist was entered for you, please call us  in 2 weeks if you have not heard from the specialist office to schedule.

## 2024-12-06 ENCOUNTER — Encounter (HOSPITAL_COMMUNITY): Payer: Self-pay

## 2024-12-06 ENCOUNTER — Inpatient Hospital Stay (HOSPITAL_COMMUNITY): Admission: RE | Admit: 2024-12-06 | Discharge: 2024-12-06 | Attending: Neurosurgery

## 2024-12-06 ENCOUNTER — Inpatient Hospital Stay (HOSPITAL_COMMUNITY)
Admission: RE | Admit: 2024-12-06 | Discharge: 2024-12-06 | Disposition: A | Discharge status: Home / Self Care | Source: Ambulatory Visit

## 2024-12-06 ENCOUNTER — Other Ambulatory Visit: Payer: Self-pay

## 2024-12-06 VITALS — BP 127/69 | HR 89 | Temp 98.4°F | Resp 16 | Ht 67.0 in | Wt 174.9 lb

## 2024-12-06 DIAGNOSIS — Z01812 Encounter for preprocedural laboratory examination: Secondary | ICD-10-CM | POA: Insufficient documentation

## 2024-12-06 DIAGNOSIS — I480 Paroxysmal atrial fibrillation: Secondary | ICD-10-CM | POA: Insufficient documentation

## 2024-12-06 DIAGNOSIS — M797 Fibromyalgia: Secondary | ICD-10-CM | POA: Insufficient documentation

## 2024-12-06 DIAGNOSIS — Z01818 Encounter for other preprocedural examination: Secondary | ICD-10-CM

## 2024-12-06 DIAGNOSIS — M542 Cervicalgia: Secondary | ICD-10-CM | POA: Insufficient documentation

## 2024-12-06 DIAGNOSIS — J45909 Unspecified asthma, uncomplicated: Secondary | ICD-10-CM | POA: Insufficient documentation

## 2024-12-06 HISTORY — DX: Headache, unspecified: R51.9

## 2024-12-06 HISTORY — DX: Depression, unspecified: F32.A

## 2024-12-06 LAB — SURGICAL PCR SCREEN
MRSA, PCR: NEGATIVE
Staphylococcus aureus: NEGATIVE

## 2024-12-06 NOTE — Progress Notes (Signed)
 PCP - Catherine Charlies LABOR, DO  Cardiologist - Redell Shallow, MD   PPM/ICD - Denies Device Orders - n/a Rep Notified - n/a  Chest x-ray -  EKG - 08-01-24 Stress Test -  ECHO - 02-12-23 Cardiac Cath -   Sleep Study - denies CPAP - n/a but does sleep with a nasal Dilator  DM -denies, but currently using a Dex com.. She reports while in hospital during heart surgery she require insulin . Now she is just monitoring blood sugar herself. PCP did not prescribe dex com  Blood Thinner Instructions:denies Aspirin  Instructions:denies  ERAS Protcol - NPO    COVID TEST-    Anesthesia review: Yes cardiac hx  Patient denies shortness of breath, fever, cough and chest pain at PAT appointment   All instructions explained to the patient, with a verbal understanding of the material. Patient agrees to go over the instructions while at home for a better understanding. Patient also instructed to self quarantine after being tested for COVID-19. The opportunity to ask questions was provided.

## 2024-12-06 NOTE — Progress Notes (Signed)
 Surgical Instructions     Your procedure is scheduled on February 2. Report to [facility] Main Entrance A at 5:30 A.M., then check in with the Admitting office. Any questions or running late day of surgery: call [phone]   Questions prior to your surgery date: call [phone], Monday-Friday, 8am-4pm. If you experience any cold or flu symptoms such as cough, fever, chills, shortness of breath, etc. between now and your scheduled surgery, please notify us  at the above number.            Remember:       Do not eat or drink anything after midnight the night before your surgery   Take these medicines the morning of surgery with A SIP OF WATER  fexofenadine  (ALLEGRA )  Glycerin-Hypromellose-PEG 400 (DRY EYE RELIEF DROPS)  omeprazole  (PRILOSEC)    May take these medicines IF NEEDED: acetaminophen  (TYLENOL )  albuterol  (VENTOLIN  HFA) -please bring inhaler to the hospital  ketotifen (ZADITOR)  metoprolol  tartrate (LOPRESSOR )  Polyethyl Glycol-Propyl Glycol (SYSTANE OP)  sodium chloride  (OCEAN) 0.65 % SOLN nasal spray    One week prior to surgery, STOP taking any Aspirin  (unless otherwise instructed by your surgeon) Aleve, Naproxen, Ibuprofen , Motrin , Advil , Goody's, BC's, all herbal medications, fish oil, and non-prescription vitamins.                     Do NOT Smoke (Tobacco/Vaping) for 24 hours prior to your procedure.   If you use a CPAP at night, you may bring your mask/headgear for your overnight stay.   You will be asked to remove any contacts, glasses, piercing's, hearing aid's, dentures/partials prior to surgery. Please bring cases for these items if needed.    Your surgeon will determine if you are to be admitted or discharged the same day.  Patients discharged the day of surgery will not be allowed to drive home, and someone needs to stay with them for 24 hours.   SURGICAL WAITING ROOM VISITATION Patients may have no more than 2 support people in the waiting area -  these visitors may rotate.   Pre-op nurse will coordinate an appropriate time for 2 ADULT support persons, who may not rotate, to accompany patient in pre-op.  Children under the age of 49 must have an adult with them who is not the patient and must remain in the main waiting area with an adult.   If the patient needs to stay at the hospital during part of their recovery, the visitor guidelines for inpatient rooms apply.   Please refer to the [facility] website for the visitor guidelines for any additional information.     If you received a COVID test during your pre-op visit  it is requested that you wear a mask when out in public, stay away from anyone that may not be feeling well and notify your surgeon if you develop symptoms. If you have been in contact with anyone that has tested positive in the last 10 days please notify you surgeon.         Pre-operative 4 CHG Bathing Instructions    You can play a key role in reducing the risk of infection after surgery. Your skin needs to be as free of germs as possible. You can reduce the number of germs on your skin by washing with CHG (chlorhexidine  gluconate) soap before surgery. CHG is an antiseptic soap that kills germs and continues to kill germs even after washing.    DO NOT use if you  have an allergy to chlorhexidine /CHG or antibacterial soaps. If your skin becomes reddened or irritated, stop using the CHG and notify one of our RNs at [phone].    Please shower with the CHG soap starting 4 days before surgery using the following schedule:       Please keep in mind the following:  DO NOT shave, including legs and underarms, starting the day of your first shower.   You may shave your face at any point before/day of surgery.  Place clean sheets on your bed the day you start using CHG soap. Use a clean washcloth (not used since being washed) for each shower. DO NOT sleep with pets once you start using the CHG.    CHG Shower  Instructions:  Wash your face and private area with normal soap. If you choose to wash your hair, wash first with your normal shampoo.  After you use shampoo/soap, rinse your hair and body thoroughly to remove shampoo/soap residue.  Turn the water OFF and apply  bottle of CHG soap to a CLEAN washcloth.  Apply CHG soap ONLY FROM YOUR NECK DOWN TO YOUR TOES (washing for 3-5 minutes)  DO NOT use CHG soap on face, private areas, open wounds, or sores.  Pay special attention to the area where your surgery is being performed.  If you are having back surgery, having someone wash your back for you may be helpful. Wait 2 minutes after CHG soap is applied, then you may rinse off the CHG soap.  Pat dry with a clean towel  Put on clean clothes/pajamas   If you choose to wear lotion, please use ONLY the CHG-compatible lotions that are listed below.   Additional instructions for the day of surgery:   If you choose, you may shower the morning of surgery with an antibacterial soap.  DO NOT APPLY any lotions, deodorants, cologne, or perfumes.   Do not bring valuables to the hospital. [facility] is not responsible for any belongings/valuables. Do not wear nail polish, gel polish, artificial nails, or any other type of covering on natural nails (fingers and toes) Do not wear jewelry or makeup Put on clean/comfortable clothes.  Please brush your teeth.  Ask your nurse before applying any prescription medications to the skin.        CHG Compatible Lotions    Aveeno Moisturizing lotion  Cetaphil Moisturizing Cream  Cetaphil Moisturizing Lotion  Clairol Herbal Essence Moisturizing Lotion, Dry Skin  Clairol Herbal Essence Moisturizing Lotion, Extra Dry Skin  Clairol Herbal Essence Moisturizing Lotion, Normal Skin  Curel Age Defying Therapeutic Moisturizing Lotion with Alpha Hydroxy  Curel Extreme Care Body Lotion  Curel Soothing Hands Moisturizing Hand Lotion  Curel Therapeutic Moisturizing Cream,  Fragrance-Free  Curel Therapeutic Moisturizing Lotion, Fragrance-Free  Curel Therapeutic Moisturizing Lotion, Original Formula  Eucerin Daily Replenishing Lotion  Eucerin Dry Skin Therapy Plus Alpha Hydroxy Crme  Eucerin Dry Skin Therapy Plus Alpha Hydroxy Lotion  Eucerin Original Crme  Eucerin Original Lotion  Eucerin Plus Crme Eucerin Plus Lotion  Eucerin TriLipid Replenishing Lotion  Keri Anti-Bacterial Hand Lotion  Keri Deep Conditioning Original Lotion Dry Skin Formula Softly Scented  Keri Deep Conditioning Original Lotion, Fragrance Free Sensitive Skin Formula  Keri Lotion Fast Absorbing Fragrance Free Sensitive Skin Formula  Keri Lotion Fast Absorbing Softly Scented Dry Skin Formula  Keri Original Lotion  Keri Skin Renewal Lotion Keri Silky Smooth Lotion  Keri Silky Smooth Sensitive Skin Lotion  Nivea Body Creamy Conditioning Oil  Nivea Body  Extra Enriched Teacher, Adult Education Moisturizing Lotion Nivea Crme  Nivea Skin Firming Lotion  NutraDerm 30 Skin Lotion  NutraDerm Skin Lotion  NutraDerm Therapeutic Skin Cream  NutraDerm Therapeutic Skin Lotion  ProShield Protective Hand Cream  Provon moisturizing lotion   Please read over the following fact sheets that you were given.

## 2024-12-07 NOTE — Progress Notes (Signed)
 Anesthesia Chart Review:  Case: [id number] Date/Time: 12/12/24 0715   Procedure: ANTERIOR CERVICAL DECOMPRESSION/DISCECTOMY FUSION 1 LEVEL - ACDF - C5-C6   Anesthesia type: General   Diagnosis: Cervicalgia [M54.2]   Pre-op diagnosis: Cervicalgia   [facility] ROOM [facility] / [facility]   Surgeons: Onetha Kuba, MD       DISCUSSION: Patient is a 54 year old female scheduled for the above procedure.  History includes never smoker, murmur, right atrial lipoma (s/p minimally invasive resection with autologulous pericardial patch reconstruction of AV groove, closure of PFO 07/11/2016), paroxysmal atrial flutter  (10/2016), asthma, childhood syncope (and hypotension with epidural), anemia, fibromyalgia, hysterectomy/BSO (8/5/20215), cholecystectomy (11/21/2019), spinal surgery.   She had cardiology follow-up with Dr. Pietro on 08/01/2024 for follow-up atrial lipoma. Initially presented in 2017 with dyspnea.  Echocardiogram showed right atrial mass.  Coronary CTA in August 2017 showed coronary calcium score of 0 with no CAD.  She subsequently underwent resection of right atrial lipoma and repair of PFO.  In December 2017 she developed new onset atrial flutter, converted spontaneously.  TSH was normal.  She was not anticoagulated due to CHA2DS2-VASc 1. last TTE in April 2024 showed normal LV function and no right atrial mass.  She continues to have occasional palpitations and takes as needed beta-blocker. 12 month follow-up planned.  Preoperative CV risk assessment outlined by Emelia Hazy, NP on 09/26/2024: Given past medical history and time since last visit, based on ACC/AHA guidelines, Emily Joseph would be at acceptable risk for the planned procedure without further cardiovascular testing.    Her RCRI is very low risk, 0.4% risk of major cardiac event.  She brought in copies of labs from [facility] dated 11/30/2024. Results are in her shadow chart and include: TSH 1.110, free T41.36, WBC 9.3, hemoglobin 14.1,  hematocrit 43.3, platelet count 295, glucose 108, BUN 16, creatinine 0.71, eGFR 102, sodium 138, potassium 3.6, calcium 9.8, albumin  4.7, total bilirubin 0.3, alkaline phosphatase 120, AST 16, ALT 24, A1c 5.6%.  Anesthesia team to evaluate on the day of surgery.  VS: BP 127/69   Pulse 89   Temp 36.9 C   Resp 16   Ht 5' 7 (1.702 m)   Wt 79.3 kg   LMP 05/20/2014   SpO2 100%   BMI 27.39 kg/m   PROVIDERS: Catherine Charlies LABOR, DO is PCP  Pietro Rogue, MD is cardiologist   LABS: See DISCUSSION. (all labs ordered are listed, but only abnormal results are displayed)  Labs Reviewed  SURGICAL PCR SCREEN    IMAGES: MRI C-spine 07/06/2024: IMPRESSION: 1. Degenerative spondylosis at C5-6 with resultant moderate right worse than left C6 foraminal stenosis. 2. Additional mild for age noncompressive disc bulging at C4-5 and C6-7 without significant stenosis or impingement.    EKG: 08/01/2024: Normal sinus rhythm with sinus arrhythmia Rightward axis Pulmonary disease pattern Confirmed by Pietro Rogue ([id number]) on 08/01/2024 9:10:37 AM      CV: Echo 02/12/2023: IMPRESSIONS   1. Left ventricular ejection fraction, by estimation, is 60 to 65%. The  left ventricle has normal function. The left ventricle has no regional  wall motion abnormalities. GLS -14.3%   2. Right ventricular systolic function is normal. The right ventricular  size is normal. There is normal pulmonary artery systolic pressure.   3. The mitral valve is normal in structure. No evidence of mitral valve  regurgitation. No evidence of mitral stenosis.   4. The aortic valve is normal in structure. Aortic valve regurgitation is  not visualized.  Aortic valve sclerosis is present, with no evidence of  aortic valve stenosis.   5. The inferior vena cava is normal in size with greater than 50%  respiratory variability, suggesting right atrial pressure of 3 mmHg.    Cardiac event monitor 08/24/2017: Sinus to sinus tach with  pacs, rare PVC, and brief PAT.    US  Carotid & ABI's 07/09/2016: Summary:  - Findings are consistent with a 1-39 percent stenosis involving    the right internal carotid artery and the left internal carotid    artery. The vertebral arteries demonstrate antegrade flow.  - Bilateral ABI&'s are within normal limits at rest.    CTA Coronary (PRE-Atrial mass resection) 07/07/2016: IMPRESSION: 1. Coronary calcium score of 0. This was 0 percentile for age and sex matched control. 2. Coronary arteries: Normal coronary origin with right dominance. There is no evidence for CAD. 3. Right atrial mass: There is a large right atrial mass measuring 42 mm in the right to left direction, 41 mm in the anteroposterior direction and 39 mm in the supero-inferior direction. The mass originates in the lateral wall of the right atrium with a wide base measuring 18 mm. Minimal to none residual atrial wall is visible. There is no obvious penetration into the pericardial space and no pericardial effusion is seen. The mass is well circumscribed and fairly homogenous with high fat content (mean HU -94). No other intracardiac masses are seen. - The differential includes lipoma and myxoma. It is unlikely that this is a malignant mass.  Past Medical History:  Diagnosis Date   Adenomyosis    not papanicolaou smear of cervix and cervical HPV   Allergy    Anxiety 06/08/2016   -about her health and about taking medications   Arthritis    ?   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 Ishmael in 2017, [place] rheum -seeing Dr. Ines, Neurologist for back pain with radicular symptoms   Complication of anesthesia    with epidural bp bottoms out   Depression    Dysrhythmia    fast hr   Eosinophilic  esophagitis    Sees Dr. Avram   Fibromyalgia    GERD (gastroesophageal reflux disease)    H/O laminectomy 09/07/2020   Headache    Heart murmur    History of atrial flutter 10/2016   spontaneous conversion to sinus   History of gallstones    History of kidney stones    History of laparoscopic-assisted vaginal hysterectomy 09/07/2020   History of pneumonia    when pt was pregnant   History of pneumothorax 07/2016   Right   History of sinus tachycardia    Iron deficiency anemia due to chronic blood loss    Menorrhagia, s/p complete hysterectomy, ovaries remain hx   Irritable bowel syndrome 01/27/2013   Dr Avram 02/2016    Microhematuria    Mouth problem    failed gum graft 1/16   Nasal airway abnormality    Nasal obstruction 06/26/2015   Seen by ENT 06-26-15.  May need sleep study to see if having apnea     Palpitations    Pelvic floor dysfunction in female    s/p minimally invasive resection of right atrial lipoma    4 cm  mass in right atrium c/w benign cardiac lipoma   Shortness of breath dyspnea    SUI (stress urinary incontinence, female)    Syncope    with last child with epideral   UTI (urinary tract infection) 07/14/2016   >=100,000 COLONIES/mL KLEBSIELLA PNEUMONIA   Vasovagal episode    post eidural   Vitamin D  deficiency     Past Surgical History:  Procedure Laterality Date   ABDOMINAL HYSTERECTOMY     APPENDECTOMY  01/1989   BALLOON DILATION N/A 02/09/2013   Procedure: BALLOON DILATION;  Surgeon: Elsie Cree, MD;  Location: [facility];  Service: Endoscopy;  Laterality: N/A;   BILATERAL SALPINGECTOMY N/A 06/14/2014   Procedure: BILATERAL SALPINGECTOMY;  Surgeon: Lynwood FORBES Curlene PONCE, MD;  Location: [facility];  Service: Gynecology;  Laterality: N/A;   BIOPSY  09/04/2021   Procedure: BIOPSY;  Surgeon: Avram Lupita FORBES, MD;  [facility]: [facility];  Service: Endoscopy;;   BIOPSY  05/07/2023   Procedure: BIOPSY;  Surgeon: Avram Lupita FORBES, MD;  Location: [facility];  Service: Gastroenterology;;   BIOPSY  09/24/2023   Procedure: BIOPSY;  Surgeon: Avram Lupita FORBES, MD;  Location: [facility];  Service: Gastroenterology;;   BREAST BIOPSY Right    biopsy    CHOLECYSTECTOMY N/A 11/21/2019   Procedure: LAPAROSCOPIC CHOLECYSTECTOMY WITH INTRAOPERATIVE CHOLANGIOGRAM;  Surgeon: Ethyl Lenis, MD;  [facility];  Service: General;  Laterality: N/A;   COLONOSCOPY WITH PROPOFOL  N/A 02/09/2013   Procedure: COLONOSCOPY WITH PROPOFOL ;  Surgeon: Elsie Cree, MD;  [facility];  Service: Endoscopy;  Laterality: N/A;  need ultra thin colon scope   COLONOSCOPY WITH PROPOFOL  N/A 05/07/2023   Procedure: COLONOSCOPY WITH PROPOFOL ;  Surgeon: Avram Lupita FORBES, MD;  [facility];  Service: Gastroenterology;  Laterality: N/A;   ESOPHAGEAL DILATION  09/04/2021   Procedure: ESOPHAGEAL DILATION;  Surgeon: Avram Lupita FORBES, MD;  [facility];  Service: Endoscopy;;   ESOPHAGEAL DILATION  05/07/2023   Procedure: ESOPHAGEAL DILATION;  Surgeon: Avram Lupita FORBES, MD;  [facility];  Service: Gastroenterology;;   ESOPHAGOGASTRODUODENOSCOPY Left 06/30/2021   Procedure: ESOPHAGOGASTRODUODENOSCOPY (EGD);  Surgeon: San Sandor GAILS, DO;  Location: [facility];  Service: Gastroenterology;  Laterality: Left;   ESOPHAGOGASTRODUODENOSCOPY (EGD) WITH PROPOFOL  N/A 02/09/2013   Procedure: ESOPHAGOGASTRODUODENOSCOPY (EGD) WITH PROPOFOL ;  Surgeon: Elsie Cree, MD;  [facility];  Service: Endoscopy;  Laterality: N/A;   ESOPHAGOGASTRODUODENOSCOPY (EGD) WITH PROPOFOL  N/A 09/04/2021   Procedure: ESOPHAGOGASTRODUODENOSCOPY (EGD) WITH PROPOFOL ;  Surgeon: Avram Lupita FORBES, MD;  [facility];  Service: Endoscopy;  Laterality: N/A;   ESOPHAGOGASTRODUODENOSCOPY (EGD) WITH PROPOFOL  N/A 05/07/2023   Procedure: ESOPHAGOGASTRODUODENOSCOPY (EGD) WITH PROPOFOL ;  Surgeon: Avram Lupita FORBES, MD;  [facility];  Service: Gastroenterology;   Laterality: N/A;   ESOPHAGOGASTRODUODENOSCOPY (EGD) WITH PROPOFOL  N/A 09/24/2023   Procedure: ESOPHAGOGASTRODUODENOSCOPY (EGD) WITH PROPOFOL ;  Surgeon: Avram Lupita FORBES, MD;  [facility];  Service: Gastroenterology;  Laterality: N/A;   FOREIGN BODY REMOVAL  06/30/2021   Procedure: FOREIGN BODY REMOVAL;  Surgeon: San Sandor GAILS, DO;  [facility];  Service: Gastroenterology;;   LAPAROSCOPIC ASSISTED VAGINAL HYSTERECTOMY Right 06/14/2014   Procedure: OPEN LAPAROSCOPIC ASSISTED VAGINAL HYSTERECTOMY;  Surgeon: Lynwood FORBES Curlene PONCE, MD;  Location: [facility];  Service: Gynecology;  Laterality: Right;   LYSIS OF ADHESION N/A 06/14/2014   Procedure: LYSIS OF ADHESION;  Surgeon: Lynwood FORBES Curlene PONCE, MD;  Location: [facility];  Service: Gynecology;  Laterality: N/A;   MINIMALLY INVASIVE EXCISION OF ATRIAL MYXOMA  Right 07/11/2016   Procedure: MINIMALLY INVASIVE RESECTION OF RIGHT ATRIAL LIPOMA WITH CLOSURE OF PATENT FORAMEN OVALE;  Surgeon: Sudie VEAR Laine, MD;  [facility];  Service: Open Heart Surgery;  Laterality: Right;   OVARIAN CYST REMOVAL Right 1990   TEE WITHOUT CARDIOVERSION N/A 07/11/2016   Procedure: TRANSESOPHAGEAL ECHOCARDIOGRAM (TEE);  Surgeon: Sudie VEAR Laine, MD;  [facility] OR;  Service: Open Heart Surgery;  Laterality: N/A;    MEDICATIONS:  acetaminophen  (TYLENOL ) 500 MG tablet   albuterol  (VENTOLIN  HFA) 108 (90 Base) MCG/ACT inhaler   B Complex Vitamins (B COMPLEX PO)   Digestive Enzymes (DIGESTIVE ENZYME PO)   EPINEPHrine  0.3 mg/0.3 mL IJ SOAJ injection   fexofenadine  (ALLEGRA ) 180 MG tablet   Glycerin-Hypromellose-PEG 400 (DRY EYE RELIEF DROPS) 0.2-0.2-1 % SOLN   ketotifen (ZADITOR) 0.035 % ophthalmic solution   omeprazole  (PRILOSEC) 20 MG capsule   Polyethyl Glycol-Propyl Glycol (SYSTANE OP)   polyethylene glycol (MIRALAX  / GLYCOLAX ) 17 g packet   sodium chloride  (OCEAN) 0.65 % SOLN nasal spray   tiZANidine  (ZANAFLEX ) 2 MG tablet   vitamin C (ASCORBIC  ACID) 250 MG tablet   Vitamin D -Vitamin K (K2 PLUS D3 PO)   No current facility-administered medications for this encounter.   Isaiah Ruder, PA-C Surgical Short Stay/Anesthesiology [facility] Phone [phone] [facility] Phone [phone] 12/07/2024 6:04 PM

## 2024-12-12 ENCOUNTER — Ambulatory Visit (HOSPITAL_COMMUNITY): Payer: Self-pay | Admitting: Vascular Surgery

## 2024-12-12 ENCOUNTER — Ambulatory Visit (HOSPITAL_COMMUNITY)

## 2024-12-12 ENCOUNTER — Ambulatory Visit (HOSPITAL_COMMUNITY)
Admission: RE | Admit: 2024-12-12 | Discharge: 2024-12-13 | Disposition: A | Attending: Neurosurgery | Admitting: Neurosurgery

## 2024-12-12 ENCOUNTER — Encounter (HOSPITAL_COMMUNITY): Admission: RE | Disposition: A | Payer: Self-pay | Source: Home / Self Care | Attending: Neurosurgery

## 2024-12-12 DIAGNOSIS — M4802 Spinal stenosis, cervical region: Secondary | ICD-10-CM | POA: Diagnosis present

## 2024-12-12 DIAGNOSIS — K219 Gastro-esophageal reflux disease without esophagitis: Secondary | ICD-10-CM | POA: Insufficient documentation

## 2024-12-12 DIAGNOSIS — Z7722 Contact with and (suspected) exposure to environmental tobacco smoke (acute) (chronic): Secondary | ICD-10-CM | POA: Insufficient documentation

## 2024-12-12 DIAGNOSIS — M797 Fibromyalgia: Secondary | ICD-10-CM | POA: Insufficient documentation

## 2024-12-12 DIAGNOSIS — J45909 Unspecified asthma, uncomplicated: Secondary | ICD-10-CM | POA: Insufficient documentation

## 2024-12-12 DIAGNOSIS — M4712 Other spondylosis with myelopathy, cervical region: Secondary | ICD-10-CM | POA: Insufficient documentation

## 2024-12-12 DIAGNOSIS — M4722 Other spondylosis with radiculopathy, cervical region: Secondary | ICD-10-CM | POA: Insufficient documentation

## 2024-12-12 DIAGNOSIS — Z79899 Other long term (current) drug therapy: Secondary | ICD-10-CM | POA: Insufficient documentation

## 2024-12-12 MED ORDER — VITAMIN D 25 MCG (1000 UNIT) PO TABS
1000.0000 [IU] | ORAL_TABLET | Freq: Every day | ORAL | Status: DC
  Administered 2024-12-12: 1000 [IU] via ORAL
  Filled 2024-12-12: qty 1

## 2024-12-12 MED ORDER — DEXAMETHASONE SOD PHOSPHATE PF 10 MG/ML IJ SOLN
INTRAMUSCULAR | Status: DC | PRN
  Administered 2024-12-12: 10 mg via INTRAVENOUS

## 2024-12-12 MED ORDER — ALUM & MAG HYDROXIDE-SIMETH 200-200-20 MG/5ML PO SUSP: 30.0000 mL | Freq: Four times a day (QID) | ORAL | Status: DC | PRN

## 2024-12-12 MED ORDER — METOPROLOL TARTRATE 12.5 MG HALF TABLET: 12.5000 mg | ORAL_TABLET | Freq: Two times a day (BID) | ORAL | Status: DC

## 2024-12-12 MED ORDER — SALINE SPRAY 0.65 % NA SOLN: 1.0000 | NASAL | Status: DC | PRN

## 2024-12-12 MED ORDER — SODIUM CHLORIDE 0.9% FLUSH: 3.0000 mL | INTRAVENOUS | Status: DC | PRN

## 2024-12-12 MED ORDER — ONDANSETRON HCL 4 MG/2ML IJ SOLN: 4.0000 mg | Freq: Four times a day (QID) | INTRAMUSCULAR | Status: DC | PRN

## 2024-12-12 MED ORDER — EPINEPHRINE 0.3 MG/0.3ML IJ SOAJ: 0.3000 mg | INTRAMUSCULAR | Status: DC | PRN

## 2024-12-12 MED ORDER — SODIUM CHLORIDE 0.9% FLUSH
3.0000 mL | Freq: Two times a day (BID) | INTRAVENOUS | Status: DC
  Administered 2024-12-12 (×2): 3 mL via INTRAVENOUS

## 2024-12-12 MED ORDER — SODIUM CHLORIDE 0.9 % IV SOLN
250.0000 mL | INTRAVENOUS | Status: DC
  Administered 2024-12-12: 250 mL via INTRAVENOUS

## 2024-12-12 MED ORDER — ALBUTEROL SULFATE HFA 108 (90 BASE) MCG/ACT IN AERS: 2.0000 | INHALATION_SPRAY | RESPIRATORY_TRACT | Status: DC | PRN

## 2024-12-12 MED ORDER — ACETAMINOPHEN 650 MG RE SUPP: 650.0000 mg | RECTAL | Status: DC | PRN

## 2024-12-12 MED ORDER — CHLORHEXIDINE GLUCONATE CLOTH 2 % EX PADS: 6.0000 | MEDICATED_PAD | Freq: Once | CUTANEOUS | Status: DC

## 2024-12-12 MED ORDER — THROMBIN 5000 UNITS EX KIT
PACK | CUTANEOUS | Status: AC
  Filled 2024-12-12: qty 3

## 2024-12-12 MED ORDER — CEFAZOLIN SODIUM-DEXTROSE 2-4 GM/100ML-% IV SOLN
INTRAVENOUS | Status: AC
  Filled 2024-12-12: qty 100

## 2024-12-12 MED ORDER — THROMBIN 5000 UNITS EX SOLR
OROMUCOSAL | Status: DC | PRN
  Administered 2024-12-12: 5 mL via TOPICAL

## 2024-12-12 MED ORDER — OXYCODONE HCL 5 MG/5ML PO SOLN: 5.0000 mg | Freq: Once | ORAL | Status: DC | PRN

## 2024-12-12 MED ORDER — KETOTIFEN FUMARATE 0.035 % OP SOLN: 1.0000 [drp] | Freq: Every day | OPHTHALMIC | Status: DC | PRN

## 2024-12-12 MED ORDER — LIDOCAINE 2% (20 MG/ML) 5 ML SYRINGE
INTRAMUSCULAR | Status: AC
  Filled 2024-12-12: qty 5

## 2024-12-12 MED ORDER — TIZANIDINE HCL 2 MG PO TABS: 1.0000 mg | ORAL_TABLET | Freq: Four times a day (QID) | ORAL | Status: DC | PRN

## 2024-12-12 MED ORDER — HYDROCODONE-ACETAMINOPHEN 5-325 MG PO TABS
2.0000 | ORAL_TABLET | ORAL | Status: DC | PRN
  Administered 2024-12-12 (×2): 2 via ORAL
  Administered 2024-12-13: 1 via ORAL
  Filled 2024-12-12 (×4): qty 2

## 2024-12-12 MED ORDER — AMISULPRIDE (ANTIEMETIC) 5 MG/2ML IV SOLN: 10.0000 mg | Freq: Once | INTRAVENOUS | Status: DC | PRN

## 2024-12-12 MED ORDER — LIDOCAINE 2% (20 MG/ML) 5 ML SYRINGE
INTRAMUSCULAR | Status: DC | PRN
  Administered 2024-12-12: 100 mg via INTRAVENOUS

## 2024-12-12 MED ORDER — LACTATED RINGERS IV SOLN: INTRAVENOUS | Status: DC

## 2024-12-12 MED ORDER — FENTANYL CITRATE (PF) 250 MCG/5ML IJ SOLN
INTRAMUSCULAR | Status: AC
  Filled 2024-12-12: qty 5

## 2024-12-12 MED ORDER — ORAL CARE MOUTH RINSE: 15.0000 mL | Freq: Once | OROMUCOSAL | Status: AC

## 2024-12-12 MED ORDER — HYDROMORPHONE HCL 1 MG/ML IJ SOLN
0.2500 mg | INTRAMUSCULAR | Status: DC | PRN
  Administered 2024-12-12 (×2): 0.5 mg via INTRAVENOUS

## 2024-12-12 MED ORDER — TIZANIDINE HCL 2 MG PO TABS: 1.0000 mg | ORAL_TABLET | Freq: Four times a day (QID) | ORAL | 1 refills | Status: AC | PRN

## 2024-12-12 MED ORDER — 0.9 % SODIUM CHLORIDE (POUR BTL) OPTIME
TOPICAL | Status: DC | PRN
  Administered 2024-12-12: 1000 mL

## 2024-12-12 MED ORDER — CEFAZOLIN SODIUM-DEXTROSE 2-4 GM/100ML-% IV SOLN
2.0000 g | Freq: Three times a day (TID) | INTRAVENOUS | Status: AC
  Administered 2024-12-12 (×2): 2 g via INTRAVENOUS
  Filled 2024-12-12 (×2): qty 100

## 2024-12-12 MED ORDER — DIGESTIVE ENZYME PO CAPS: ORAL_CAPSULE | Freq: Every day | ORAL | Status: DC

## 2024-12-12 MED ORDER — PROPOFOL 500 MG/50ML IV EMUL
INTRAVENOUS | Status: DC | PRN
  Administered 2024-12-12: 125 ug/kg/min via INTRAVENOUS

## 2024-12-12 MED ORDER — PHENOL 1.4 % MT LIQD: 1.0000 | OROMUCOSAL | Status: DC | PRN

## 2024-12-12 MED ORDER — PHENYLEPHRINE 80 MCG/ML (10ML) SYRINGE FOR IV PUSH (FOR BLOOD PRESSURE SUPPORT)
PREFILLED_SYRINGE | INTRAVENOUS | Status: AC
  Filled 2024-12-12: qty 10

## 2024-12-12 MED ORDER — HYDROMORPHONE HCL 1 MG/ML IJ SOLN
0.5000 mg | INTRAMUSCULAR | Status: DC | PRN
  Administered 2024-12-12: 0.5 mg via INTRAVENOUS
  Filled 2024-12-12: qty 0.5

## 2024-12-12 MED ORDER — CEFAZOLIN SODIUM-DEXTROSE 2-4 GM/100ML-% IV SOLN
2.0000 g | INTRAVENOUS | Status: AC
  Administered 2024-12-12: 2 g via INTRAVENOUS

## 2024-12-12 MED ORDER — ACETAMINOPHEN 325 MG PO TABS: 650.0000 mg | ORAL_TABLET | ORAL | Status: DC | PRN

## 2024-12-12 MED ORDER — ACETAMINOPHEN 500 MG PO TABS: 500.0000 mg | ORAL_TABLET | Freq: Four times a day (QID) | ORAL | Status: DC | PRN

## 2024-12-12 MED ORDER — ACETAMINOPHEN 10 MG/ML IV SOLN
INTRAVENOUS | Status: AC
  Filled 2024-12-12: qty 100

## 2024-12-12 MED ORDER — PROPOFOL 10 MG/ML IV BOLUS
INTRAVENOUS | Status: AC
  Filled 2024-12-12: qty 20

## 2024-12-12 MED ORDER — ROCURONIUM BROMIDE 10 MG/ML (PF) SYRINGE
PREFILLED_SYRINGE | INTRAVENOUS | Status: DC | PRN
  Administered 2024-12-12: 70 mg via INTRAVENOUS
  Administered 2024-12-12: 30 mg via INTRAVENOUS

## 2024-12-12 MED ORDER — HYDROMORPHONE HCL 1 MG/ML IJ SOLN
INTRAMUSCULAR | Status: AC
  Filled 2024-12-12: qty 1

## 2024-12-12 MED ORDER — SUGAMMADEX SODIUM 200 MG/2ML IV SOLN
INTRAVENOUS | Status: DC | PRN
  Administered 2024-12-12: 200 mg via INTRAVENOUS

## 2024-12-12 MED ORDER — GLYCERIN-HYPROMELLOSE-PEG 400 0.2-0.2-1 % OP SOLN: 1.0000 [drp] | Freq: Every day | OPHTHALMIC | Status: DC

## 2024-12-12 MED ORDER — POLYETHYLENE GLYCOL 3350 17 G PO PACK: 17.0000 g | PACK | Freq: Every day | ORAL | Status: DC | PRN

## 2024-12-12 MED ORDER — MENTHOL 3 MG MT LOZG
1.0000 | LOZENGE | OROMUCOSAL | Status: DC | PRN
  Administered 2024-12-12: 3 mg via ORAL
  Filled 2024-12-12: qty 9

## 2024-12-12 MED ORDER — ONDANSETRON HCL 4 MG PO TABS
4.0000 mg | ORAL_TABLET | Freq: Four times a day (QID) | ORAL | Status: DC | PRN
  Administered 2024-12-12: 4 mg via ORAL
  Filled 2024-12-12: qty 1

## 2024-12-12 MED ORDER — CHLORHEXIDINE GLUCONATE 0.12 % MT SOLN: 15.0000 mL | Freq: Once | OROMUCOSAL | Status: AC

## 2024-12-12 MED ORDER — POLYVINYL ALCOHOL 1.4 % OP SOLN: Freq: Four times a day (QID) | OPHTHALMIC | Status: DC | PRN

## 2024-12-12 MED ORDER — CHLORHEXIDINE GLUCONATE 0.12 % MT SOLN
OROMUCOSAL | Status: AC
  Administered 2024-12-12: 15 mL via OROMUCOSAL
  Filled 2024-12-12: qty 15

## 2024-12-12 MED ORDER — PANTOPRAZOLE SODIUM 40 MG PO TBEC: 40.0000 mg | DELAYED_RELEASE_TABLET | Freq: Every day | ORAL | Status: DC

## 2024-12-12 MED ORDER — ACETAMINOPHEN 10 MG/ML IV SOLN
1000.0000 mg | Freq: Once | INTRAVENOUS | Status: DC | PRN
  Administered 2024-12-12: 1000 mg via INTRAVENOUS

## 2024-12-12 MED ORDER — VITAMIN C 500 MG PO TABS: 250.0000 mg | ORAL_TABLET | Freq: Every day | ORAL | Status: DC

## 2024-12-12 MED ORDER — THROMBIN (RECOMBINANT) 5000 UNITS EX SOLR
CUTANEOUS | Status: DC | PRN
  Administered 2024-12-12: 10 mL via TOPICAL

## 2024-12-12 MED ORDER — FENTANYL CITRATE (PF) 250 MCG/5ML IJ SOLN
INTRAMUSCULAR | Status: DC | PRN
  Administered 2024-12-12: 50 ug via INTRAVENOUS
  Administered 2024-12-12: 100 ug via INTRAVENOUS
  Administered 2024-12-12: 50 ug via INTRAVENOUS

## 2024-12-12 MED ORDER — ROCURONIUM BROMIDE 10 MG/ML (PF) SYRINGE
PREFILLED_SYRINGE | INTRAVENOUS | Status: AC
  Filled 2024-12-12: qty 10

## 2024-12-12 MED ORDER — MIDAZOLAM HCL (PF) 2 MG/2ML IJ SOLN
INTRAMUSCULAR | Status: DC | PRN
  Administered 2024-12-12: 2 mg via INTRAVENOUS

## 2024-12-12 MED ORDER — ONDANSETRON HCL 4 MG/2ML IJ SOLN
INTRAMUSCULAR | Status: AC
  Filled 2024-12-12: qty 2

## 2024-12-12 MED ORDER — LORATADINE 10 MG PO TABS: 10.0000 mg | ORAL_TABLET | Freq: Every day | ORAL | Status: DC

## 2024-12-12 MED ORDER — OXYCODONE HCL 5 MG PO TABS: 5.0000 mg | ORAL_TABLET | Freq: Once | ORAL | Status: DC | PRN

## 2024-12-12 MED ORDER — B COMPLEX PO CAPS: ORAL_CAPSULE | Freq: Every day | ORAL | Status: DC

## 2024-12-12 MED ORDER — HYDROCODONE-ACETAMINOPHEN 5-325 MG PO TABS: 2.0000 | ORAL_TABLET | ORAL | 0 refills | Status: AC | PRN

## 2024-12-12 MED ORDER — K2 PLUS D3 100-1000 MCG-UNIT PO TABS: ORAL_TABLET | Freq: Every day | ORAL | Status: DC

## 2024-12-12 MED ORDER — MIDAZOLAM HCL 2 MG/2ML IJ SOLN
INTRAMUSCULAR | Status: AC
  Filled 2024-12-12: qty 2

## 2024-12-12 MED ORDER — AZELASTINE HCL 0.1 % NA SOLN
1.0000 | Freq: Two times a day (BID) | NASAL | Status: DC
  Administered 2024-12-12: 1 via NASAL
  Filled 2024-12-12: qty 30

## 2024-12-12 MED ORDER — PROPOFOL 10 MG/ML IV BOLUS
INTRAVENOUS | Status: DC | PRN
  Administered 2024-12-12: 120 mg via INTRAVENOUS

## 2024-12-12 MED ORDER — DEXAMETHASONE SOD PHOSPHATE PF 10 MG/ML IJ SOLN
INTRAMUSCULAR | Status: AC
  Filled 2024-12-12: qty 1

## 2024-12-12 MED ORDER — PANTOPRAZOLE SODIUM 40 MG IV SOLR: 40.0000 mg | Freq: Every day | INTRAVENOUS | Status: DC

## 2024-12-12 MED ORDER — ONDANSETRON HCL 4 MG/2ML IJ SOLN: 4.0000 mg | Freq: Once | INTRAMUSCULAR | Status: DC | PRN

## 2024-12-12 MED ORDER — ONDANSETRON HCL 4 MG/2ML IJ SOLN
INTRAMUSCULAR | Status: DC | PRN
  Administered 2024-12-12: 4 mg via INTRAVENOUS

## 2024-12-12 MED ORDER — PHENYLEPHRINE HCL-NACL 20-0.9 MG/250ML-% IV SOLN
INTRAVENOUS | Status: DC | PRN
  Administered 2024-12-12: 30 ug/min via INTRAVENOUS

## 2024-12-12 NOTE — Transfer of Care (Signed)
 Immediate Anesthesia Transfer of Care Note  Patient: Emily Joseph  Procedure(s) Performed: ANTERIOR CERVICAL DECOMPRESSION/DISCECTOMY FUSION CERVICAL FIVE-CERVICAL SIX  Patient Location: PACU  Anesthesia Type:General  Level of Consciousness: drowsy and patient cooperative  Airway & Oxygen Therapy: Patient Spontanous Breathing  Post-op Assessment: Report given to RN and Post -op Vital signs reviewed and stable  Post vital signs: Reviewed and stable  Last Vitals:  Vitals Value Taken Time  BP 133/82 12/12/24 09:33  Temp 36.6 C 12/12/24 09:33  Pulse 93 12/12/24 09:37  Resp 14 12/12/24 09:36  SpO2 92 % 12/12/24 09:34  Vitals shown include unfiled device data.  Last Pain:  Vitals:   12/12/24 0933  TempSrc:   PainSc: Asleep         Complications: No notable events documented.

## 2024-12-12 NOTE — Plan of Care (Signed)
 Patient ID: Emily Joseph, female   DOB: 07/09/1971, 54 y.o.   MRN: [id number]  Problem: Education: Goal: Knowledge of General Education information will improve Description: Including pain rating scale, medication(s)/side effects and non-pharmacologic comfort measures Outcome: Progressing   Problem: Health Behavior/Discharge Planning: Goal: Ability to manage health-related needs will improve Outcome: Progressing   Problem: Clinical Measurements: Goal: Ability to maintain clinical measurements within normal limits will improve Outcome: Progressing Goal: Will remain free from infection Outcome: Progressing Goal: Diagnostic test results will improve Outcome: Progressing Goal: Respiratory complications will improve Outcome: Progressing Goal: Cardiovascular complication will be avoided Outcome: Progressing   Problem: Activity: Goal: Risk for activity intolerance will decrease Outcome: Progressing   Problem: Nutrition: Goal: Adequate nutrition will be maintained Outcome: Progressing   Problem: Coping: Goal: Level of anxiety will decrease Outcome: Progressing   Problem: Elimination: Goal: Will not experience complications related to bowel motility Outcome: Progressing Goal: Will not experience complications related to urinary retention Outcome: Progressing   Problem: Pain Managment: Goal: General experience of comfort will improve and/or be controlled Outcome: Progressing   Problem: Safety: Goal: Ability to remain free from injury will improve Outcome: Progressing   Problem: Skin Integrity: Goal: Risk for impaired skin integrity will decrease Outcome: Progressing   Problem: Education: Goal: Knowledge of the prescribed therapeutic regimen will improve Outcome: Progressing   Problem: Bowel/Gastric: Goal: Gastrointestinal status for postoperative course will improve Outcome: Progressing   Problem: Cardiac: Goal: Ability to maintain an adequate cardiac  output Outcome: Progressing Goal: Will show no evidence of cardiac arrhythmias Outcome: Progressing   Problem: Nutritional: Goal: Will attain and maintain optimal nutritional status Outcome: Progressing   Problem: Neurological: Goal: Will regain or maintain usual level of consciousness Outcome: Progressing   Problem: Clinical Measurements: Goal: Ability to maintain clinical measurements within normal limits Outcome: Progressing Goal: Postoperative complications will be avoided or minimized Outcome: Progressing   Problem: Respiratory: Goal: Will regain and/or maintain adequate ventilation Outcome: Progressing Goal: Respiratory status will improve Outcome: Progressing   Problem: Skin Integrity: Goal: Demonstrates signs of wound healing without infection Outcome: Progressing   Problem: Urinary Elimination: Goal: Will remain free from infection Outcome: Progressing Goal: Ability to achieve and maintain adequate urine output Outcome: Progressing   Problem: Education: Goal: Ability to verbalize activity precautions or restrictions will improve Outcome: Progressing Goal: Knowledge of the prescribed therapeutic regimen will improve Outcome: Progressing Goal: Understanding of discharge needs will improve Outcome: Progressing   Problem: Activity: Goal: Ability to avoid complications of mobility impairment will improve Outcome: Progressing Goal: Ability to tolerate increased activity will improve Outcome: Progressing Goal: Will remain free from falls Outcome: Progressing   Problem: Bowel/Gastric: Goal: Gastrointestinal status for postoperative course will improve Outcome: Progressing   Problem: Clinical Measurements: Goal: Ability to maintain clinical measurements within normal limits will improve Outcome: Progressing Goal: Postoperative complications will be avoided or minimized Outcome: Progressing Goal: Diagnostic test results will improve Outcome: Progressing    Problem: Pain Management: Goal: Pain level will decrease Outcome: Progressing   Problem: Skin Integrity: Goal: Will show signs of wound healing Outcome: Progressing   Problem: Health Behavior/Discharge Planning: Goal: Identification of resources available to assist in meeting health care needs will improve Outcome: Progressing   Problem: Bladder/Genitourinary: Goal: Urinary functional status for postoperative course will improve Outcome: Progressing   Verdie JONETTA Collier, RN

## 2024-12-12 NOTE — Anesthesia Postprocedure Evaluation (Signed)
"   Anesthesia Post Note  Patient: Emily Joseph  Procedure(s) Performed: ANTERIOR CERVICAL DECOMPRESSION/DISCECTOMY FUSION CERVICAL FIVE-CERVICAL SIX     Patient location during evaluation: PACU Anesthesia Type: General Level of consciousness: awake Pain management: pain level controlled Vital Signs Assessment: post-procedure vital signs reviewed and stable Respiratory status: spontaneous breathing Cardiovascular status: blood pressure returned to baseline Postop Assessment: no apparent nausea or vomiting and adequate PO intake Anesthetic complications: no   No notable events documented.          Lauraine DASEN Colhoun      "

## 2024-12-13 ENCOUNTER — Encounter (HOSPITAL_COMMUNITY): Payer: Self-pay | Admitting: Neurosurgery

## 2024-12-13 NOTE — Plan of Care (Signed)
 Pt doing well. Pt and husband given D/C instructions with verbal understanding. Rx's were sent to the pharmacy by MD. Pt's incision is clean and dry with no sign of infection. Pt's IV was removed prior to D/C. Pt D/C'd home via wheelchair per MD order. Pt is stable @ D/C and has no other needs at this time. Rema Fendt, RN

## 2024-12-13 NOTE — Evaluation (Signed)
 Occupational Therapy Evaluation Patient Details Name: Emily Joseph MRN: [id number] DOB: 07/06/1971 Today's Date: 12/13/2024   History of Present Illness   Pt is a 54 y/o F admitted to [facility] for ACDF C5-6 with NSG on 12/12/24. PMHx: anxiety, depression, arthritis, asthma.     Clinical Impressions Pt seen for OT evaluation this AM, eager to mobilize and participate. Pt and spouse with several questions re: restrictions and ADL modifications. PTA, she lived with her 27 y/o daughter and husband, fully independent. Plans to stay on basement level with full bath/bedroom. She was mod I for all mobility, UB/LB ADLs and functional transfers/mobility without AD. Demo'd good compliance with precautions throughout. Extensive conversation re: home setup/ADL adaptations. Education handout provided, all questions answered. Pt does not need any follow-up OT, she is cleared for d/c from an OT standpoint. Will sign-off. Pt already discharged at time of documentation.     If plan is discharge home, recommend the following:   Assist for transportation     Functional Status Assessment         Equipment Recommendations   None recommended by OT     Recommendations for Other Services         Precautions/Restrictions   Precautions Precautions: Cervical Precaution Booklet Issued: Yes (comment) Recall of Precautions/Restrictions: Intact Precaution/Restrictions Comments: no brace needed; pt provided soft collar for comfort Restrictions Weight Bearing Restrictions Per Provider Order: No     Mobility Bed Mobility Overal bed mobility: Modified Independent             General bed mobility comments: HOB flat, no use of bed rails, demo'd good log roll technique with cueing    Transfers Overall transfer level: Independent Equipment used: None               General transfer comment: ambulated community distance & traversed FF stairs without AD      Balance Overall balance  assessment: Independent                                         ADL either performed or assessed with clinical judgement   ADL Overall ADL's : Modified independent                                       General ADL Comments: performed UB and LB dressing without difficulty, demo'd figure four technique for LB self-care     Vision Baseline Vision/History: 1 Wears glasses Ability to See in Adequate Light: 0 Adequate Patient Visual Report: No change from baseline       Perception         Praxis         Pertinent Vitals/Pain Pain Assessment Pain Assessment: No/denies pain     Extremity/Trunk Assessment Upper Extremity Assessment Upper Extremity Assessment: Overall WFL for tasks assessed (reports tingling/pain in LUE has mostly resolved, states she has always had trouble with tingling in 5th finger)   Lower Extremity Assessment Lower Extremity Assessment: Overall WFL for tasks assessed   Cervical / Trunk Assessment Cervical / Trunk Assessment: Neck Surgery   Communication Communication Communication: No apparent difficulties   Cognition Arousal: Alert Behavior During Therapy: WFL for tasks assessed/performed Cognition: No apparent impairments             OT - Cognition Comments:  very pleasant & receptive to OT education                 Following commands: Intact       Cueing  General Comments   Cueing Techniques: Verbal cues  supportive husband present   Exercises     Shoulder Instructions      Home Living Family/patient expects to be discharged to:: Private residence Living Arrangements: Spouse/significant other;Children (54 y/o daughter) Available Help at Discharge: Family;Available 24 hours/day (spouse & daughter) Type of Home: House Home Access: Stairs to enter [employer] of Steps: 2   Home Layout: Two level (pt planning to stay on basement level (has full bath, bedroom, kitchen)) Alternate  Level Stairs-Number of Steps: FF (separated by platform with rails switching halfway down) Alternate Level Stairs-Rails: Right;Left Bathroom Shower/Tub: Producer, Television/film/video: Standard     Home Equipment: Hand held shower head;Shower seat          Prior Functioning/Environment Prior Level of Function : Independent/Modified Independent;Driving             Mobility Comments: no AD PTA ADLs Comments: fully independent    OT Problem List: Pain;Impaired UE functional use   OT Treatment/Interventions:        OT Goals(Current goals can be found in the care plan section)   Acute Rehab OT Goals Patient Stated Goal: go home   OT Frequency:       Co-evaluation              AM-PAC OT 6 Clicks Daily Activity     Outcome Measure Help from another person eating meals?: None Help from another person taking care of personal grooming?: None Help from another person toileting, which includes using toliet, bedpan, or urinal?: None Help from another person bathing (including washing, rinsing, drying)?: A Little Help from another person to put on and taking off regular upper body clothing?: None Help from another person to put on and taking off regular lower body clothing?: None 6 Click Score: 23   End of Session Equipment Utilized During Treatment: Cervical collar (soft collar) Nurse Communication: Mobility status  Activity Tolerance: Patient tolerated treatment well Patient left: with call bell/phone within reach;with family/visitor present (seated EOB)  OT Visit Diagnosis: Muscle weakness (generalized) (M62.81)                Time: 9253-9176 OT Time Calculation (min): 37 min Charges:  OT General Charges $OT Visit: 1 Visit OT Evaluation $OT Eval Low Complexity: 1 Low OT Treatments $Self Care/Home Management : 23-37 mins  Keifer Habib M. [place], OTR/L [facility] Acute Rehabilitation Services [phone] Secure Chat Preferred  Chibuike Fleek 12/13/2024,  11:00 AM
